# Patient Record
Sex: Female | Born: 1985
Health system: Southern US, Community
[De-identification: ages and names within clinical notes are randomized; demographics above are authoritative.]

## PROBLEM LIST (undated history)

## (undated) DIAGNOSIS — C801 Malignant (primary) neoplasm, unspecified: Secondary | ICD-10-CM

## (undated) HISTORY — PX: NO PAST SURGERIES: SHX2092

## (undated) HISTORY — PX: TOOTH EXTRACTION: SUR596

---

## 2019-05-07 ENCOUNTER — Ambulatory Visit (INDEPENDENT_AMBULATORY_CARE_PROVIDER_SITE_OTHER): Payer: Medicaid Other | Admitting: Family Medicine

## 2019-05-07 ENCOUNTER — Inpatient Hospital Stay (HOSPITAL_COMMUNITY)
Admission: EM | Admit: 2019-05-07 | Discharge: 2019-05-10 | DRG: 195 | Disposition: A | Discharge status: Home / Self Care | Payer: Medicaid Other | Attending: Internal Medicine | Admitting: Internal Medicine

## 2019-05-07 ENCOUNTER — Ambulatory Visit (INDEPENDENT_AMBULATORY_CARE_PROVIDER_SITE_OTHER): Payer: Medicaid Other

## 2019-05-07 ENCOUNTER — Emergency Department (HOSPITAL_COMMUNITY): Payer: Medicaid Other

## 2019-05-07 ENCOUNTER — Encounter (HOSPITAL_COMMUNITY): Payer: Self-pay

## 2019-05-07 ENCOUNTER — Ambulatory Visit (HOSPITAL_COMMUNITY)
Admission: EM | Admit: 2019-05-07 | Discharge: 2019-05-07 | Disposition: A | Discharge status: Other Acute Inpatient Hospital | Payer: Medicaid Other | Attending: Family Medicine | Admitting: Family Medicine

## 2019-05-07 ENCOUNTER — Encounter (HOSPITAL_COMMUNITY): Payer: Self-pay | Admitting: Emergency Medicine

## 2019-05-07 ENCOUNTER — Encounter: Payer: Self-pay | Admitting: Family Medicine

## 2019-05-07 ENCOUNTER — Other Ambulatory Visit: Payer: Self-pay

## 2019-05-07 VITALS — BP 150/98 | HR 154 | Temp 98.6°F | Resp 14 | Ht 66.93 in | Wt 197.2 lb

## 2019-05-07 DIAGNOSIS — J189 Pneumonia, unspecified organism: Principal | ICD-10-CM | POA: Diagnosis present

## 2019-05-07 DIAGNOSIS — R0600 Dyspnea, unspecified: Secondary | ICD-10-CM

## 2019-05-07 DIAGNOSIS — R0602 Shortness of breath: Secondary | ICD-10-CM

## 2019-05-07 DIAGNOSIS — R0609 Other forms of dyspnea: Secondary | ICD-10-CM

## 2019-05-07 DIAGNOSIS — R05 Cough: Secondary | ICD-10-CM | POA: Diagnosis not present

## 2019-05-07 DIAGNOSIS — R058 Other specified cough: Secondary | ICD-10-CM

## 2019-05-07 DIAGNOSIS — Z8349 Family history of other endocrine, nutritional and metabolic diseases: Secondary | ICD-10-CM

## 2019-05-07 DIAGNOSIS — F411 Generalized anxiety disorder: Secondary | ICD-10-CM

## 2019-05-07 DIAGNOSIS — R000 Tachycardia, unspecified: Secondary | ICD-10-CM | POA: Diagnosis not present

## 2019-05-07 DIAGNOSIS — L659 Nonscarring hair loss, unspecified: Secondary | ICD-10-CM | POA: Diagnosis not present

## 2019-05-07 DIAGNOSIS — R0902 Hypoxemia: Secondary | ICD-10-CM

## 2019-05-07 DIAGNOSIS — F329 Major depressive disorder, single episode, unspecified: Secondary | ICD-10-CM | POA: Diagnosis present

## 2019-05-07 DIAGNOSIS — Z20822 Contact with and (suspected) exposure to covid-19: Secondary | ICD-10-CM | POA: Diagnosis present

## 2019-05-07 DIAGNOSIS — F32A Depression, unspecified: Secondary | ICD-10-CM

## 2019-05-07 DIAGNOSIS — R634 Abnormal weight loss: Secondary | ICD-10-CM

## 2019-05-07 DIAGNOSIS — F321 Major depressive disorder, single episode, moderate: Secondary | ICD-10-CM

## 2019-05-07 DIAGNOSIS — Z818 Family history of other mental and behavioral disorders: Secondary | ICD-10-CM

## 2019-05-07 DIAGNOSIS — Z8249 Family history of ischemic heart disease and other diseases of the circulatory system: Secondary | ICD-10-CM

## 2019-05-07 DIAGNOSIS — Z79899 Other long term (current) drug therapy: Secondary | ICD-10-CM

## 2019-05-07 DIAGNOSIS — Z7689 Persons encountering health services in other specified circumstances: Secondary | ICD-10-CM

## 2019-05-07 DIAGNOSIS — Z833 Family history of diabetes mellitus: Secondary | ICD-10-CM

## 2019-05-07 DIAGNOSIS — F419 Anxiety disorder, unspecified: Secondary | ICD-10-CM | POA: Diagnosis present

## 2019-05-07 DIAGNOSIS — E0590 Thyrotoxicosis, unspecified without thyrotoxic crisis or storm: Secondary | ICD-10-CM | POA: Diagnosis present

## 2019-05-07 DIAGNOSIS — Z809 Family history of malignant neoplasm, unspecified: Secondary | ICD-10-CM

## 2019-05-07 DIAGNOSIS — Z23 Encounter for immunization: Secondary | ICD-10-CM

## 2019-05-07 LAB — CBC WITH DIFFERENTIAL/PLATELET
Abs Immature Granulocytes: 0.07 10*3/uL (ref 0.00–0.07)
Basophils Absolute: 0 10*3/uL (ref 0.0–0.1)
Basophils Relative: 0 %
Eosinophils Absolute: 0 10*3/uL (ref 0.0–0.5)
Eosinophils Relative: 0 %
HCT: 38.7 % (ref 36.0–46.0)
Hemoglobin: 11.9 g/dL — ABNORMAL LOW (ref 12.0–15.0)
Immature Granulocytes: 0 %
Lymphocytes Relative: 18 %
Lymphs Abs: 3 10*3/uL (ref 0.7–4.0)
MCH: 25.2 pg — ABNORMAL LOW (ref 26.0–34.0)
MCHC: 30.7 g/dL (ref 30.0–36.0)
MCV: 81.8 fL (ref 80.0–100.0)
Monocytes Absolute: 1.2 10*3/uL — ABNORMAL HIGH (ref 0.1–1.0)
Monocytes Relative: 7 %
Neutro Abs: 12.3 10*3/uL — ABNORMAL HIGH (ref 1.7–7.7)
Neutrophils Relative %: 75 %
Platelets: 575 10*3/uL — ABNORMAL HIGH (ref 150–400)
RBC: 4.73 MIL/uL (ref 3.87–5.11)
RDW: 15.4 % (ref 11.5–15.5)
WBC: 16.7 10*3/uL — ABNORMAL HIGH (ref 4.0–10.5)
nRBC: 0 % (ref 0.0–0.2)

## 2019-05-07 LAB — COMPREHENSIVE METABOLIC PANEL
ALT: 31 U/L (ref 0–44)
AST: 26 U/L (ref 15–41)
Albumin: 3.2 g/dL — ABNORMAL LOW (ref 3.5–5.0)
Alkaline Phosphatase: 82 U/L (ref 38–126)
Anion gap: 12 (ref 5–15)
BUN: 7 mg/dL (ref 6–20)
CO2: 21 mmol/L — ABNORMAL LOW (ref 22–32)
Calcium: 9.4 mg/dL (ref 8.9–10.3)
Chloride: 105 mmol/L (ref 98–111)
Creatinine, Ser: 0.63 mg/dL (ref 0.44–1.00)
GFR calc Af Amer: >60 mL/min (ref >60)
GFR calc non Af Amer: >60 mL/min (ref >60)
Glucose, Bld: 117 mg/dL — ABNORMAL HIGH (ref 70–99)
Potassium: 3.8 mmol/L (ref 3.5–5.1)
Sodium: 138 mmol/L (ref 135–145)
Total Bilirubin: 0.4 mg/dL (ref 0.3–1.2)
Total Protein: 7.9 g/dL (ref 6.5–8.1)

## 2019-05-07 LAB — I-STAT BETA HCG BLOOD, ED (MC, WL, AP ONLY): I-stat hCG, quantitative: <5 m[IU]/mL (ref <5)

## 2019-05-07 LAB — POC SARS CORONAVIRUS 2 AG -  ED: SARS Coronavirus 2 Ag: NEGATIVE

## 2019-05-07 LAB — POC SARS CORONAVIRUS 2 AG: SARS Coronavirus 2 Ag: NEGATIVE

## 2019-05-07 MED ORDER — SODIUM CHLORIDE 0.9 % IV BOLUS
1000.0000 mL | Freq: Once | INTRAVENOUS | Status: AC
  Administered 2019-05-07: 1000 mL via INTRAVENOUS

## 2019-05-07 MED ORDER — LORAZEPAM 2 MG/ML IJ SOLN
0.5000 mg | Freq: Once | INTRAMUSCULAR | Status: AC
  Administered 2019-05-07: 0.5 mg via INTRAVENOUS
  Filled 2019-05-07: qty 1

## 2019-05-07 MED ORDER — DIAZEPAM 5 MG/ML IJ SOLN
INTRAMUSCULAR | Status: AC
  Filled 2019-05-07: qty 2

## 2019-05-07 MED ORDER — ATENOLOL 25 MG PO TABS: 25.0000 mg | ORAL_TABLET | Freq: Once | ORAL | Status: DC

## 2019-05-07 NOTE — ED Notes (Signed)
Patient is being discharged from the [facility] and sent to the Emergency Department via POV. Per SN, patient is stable but in need of higher level of care due to PNA. Patient is aware and verbalizes understanding of plan of care.  Vitals:   05/07/19 1804  BP: 139/85  Pulse: (!) 150  Resp: (!) 24  Temp: 97.8 F (36.6 C)  SpO2: 94%

## 2019-05-07 NOTE — ED Provider Notes (Signed)
[facility] EMERGENCY DEPARTMENT Provider Note   CSN: [id number] Arrival date & time: 05/07/19  2049     History Chief Complaint  Patient presents with   Pneumonia    Sarah Chandler is a 34 y.o. female with a hx of no major medical problems presents to the Emergency Department complaining of gradual, persistent, progressively worsening palpitations and shortness of breath with movement onset Nov 2020.  Pt reports if she is resting she feels fine.  Pt reports associated decreased exercise tolerance, weight loss, thinning hair, decreased appetite.  She denies skin changes, diarrhea, heat/cold intolerance.  Pt reports feeling depressed and anxious over the last year and significantly worse in the last few month.   She reports approx 1 mo of sinus pressure, post nasal drip and cough. Pt reports she normally has year round allergies and often takes Claritin which has helped.  Robitussin has also helped with the cough.  Pt reports she has not come into contact with COVID and does not believe this is the cause of her symptoms.    Pt was evaluated at PCP today and EMS was called for severe tachycardia.  She declined transport by EMS and presented to the Urgent Care for evaluation.  She was subsequently transferred here to the ED.     The history is provided by the patient and medical records. No language interpreter was used.       History reviewed. No pertinent past medical history.  Patient Active Problem List   Diagnosis Date Noted   Community acquired pneumonia 05/08/2019   Thyroid storm 05/08/2019   Depression 05/08/2019   Hyperthyroidism 05/08/2019    History reviewed. No pertinent surgical history.   OB History   No obstetric history on file.     Family History  Problem Relation Age of Onset   Diabetes Father    Hyperlipidemia Father    Hypertension Father    Depression Maternal Uncle    Diabetes Maternal Uncle    Cancer Maternal  Grandmother    Hyperlipidemia Maternal Grandmother    Cancer Paternal Grandfather     Social History   Tobacco Use   Smoking status: Never Smoker   Smokeless tobacco: Never Used  Substance Use Topics   Alcohol use: Never   Drug use: Never    Home Medications Prior to Admission medications   Medication Sig Start Date End Date Taking? Authorizing Provider  guaiFENesin-dextromethorphan (ROBITUSSIN DM) 100-10 MG/5ML syrup Take 5 mLs by mouth every 4 (four) hours as needed for cough (PRN cough).   Yes [provider]  Multiple Vitamin (MULTIVITAMIN WITH MINERALS) TABS tablet Take 1 tablet by mouth daily.   Yes [provider]  OVER THE COUNTER MEDICATION CBD gummies taking 2 daily   Yes [provider]    Allergies    Enfamil reguline-iron [alimentum]  Review of Systems   Review of Systems  Constitutional: Positive for appetite change and fatigue. Negative for diaphoresis, fever and unexpected weight change.  HENT: Negative for mouth sores.   Eyes: Negative for visual disturbance.  Respiratory: Positive for cough and shortness of breath. Negative for chest tightness and wheezing.   Cardiovascular: Positive for palpitations. Negative for chest pain and leg swelling.  Gastrointestinal: Negative for abdominal pain, constipation, diarrhea, nausea and vomiting.  Endocrine: Positive for polydipsia. Negative for polyphagia and polyuria.  Genitourinary: Negative for dysuria, frequency, hematuria and urgency.  Musculoskeletal: Negative for back pain and neck stiffness.  Skin: Negative for rash.  Allergic/Immunologic: Negative for immunocompromised state.  Neurological: Negative for syncope, light-headedness and headaches.  Hematological: Does not bruise/bleed easily.  Psychiatric/Behavioral: Negative for sleep disturbance. The patient is not nervous/anxious.     Physical Exam Updated Vital Signs BP (!) 135/95 (BP Location: Left Arm)    Pulse (!) 149     Temp 98.1 F (36.7 C) (Oral)    Resp 18    LMP 04/16/2019    SpO2 95%   Physical Exam Vitals and nursing note reviewed.  Constitutional:      General: She is not in acute distress.    Appearance: She is not diaphoretic.  HENT:     Head: Normocephalic.  Eyes:     General: No scleral icterus.    Conjunctiva/sclera: Conjunctivae normal.  Neck:     Thyroid: Thyromegaly present.  Cardiovascular:     Rate and Rhythm: Regular rhythm. Tachycardia present.     Pulses: Normal pulses.          Radial pulses are 2+ on the right side and 2+ on the left side.     Comments: Significant tachycardia in the 140s to 150s Pulmonary:     Effort: Tachypnea present. No accessory muscle usage, prolonged expiration, respiratory distress or retractions.     Breath sounds: No stridor. Examination of the right-middle field reveals rales. Examination of the left-middle field reveals rales. Examination of the right-lower field reveals rales. Examination of the left-lower field reveals rales. Rales present.     Comments: Equal chest rise. Mild increased work of breathing. Abdominal:     General: There is no distension.     Palpations: Abdomen is soft.     Tenderness: There is no abdominal tenderness. There is no guarding or rebound.  Musculoskeletal:     Cervical back: Normal range of motion.     Comments: Moves all extremities equally and without difficulty.  Skin:    General: Skin is warm and dry.     Capillary Refill: Capillary refill takes less than 2 seconds.  Neurological:     Mental Status: She is alert.     GCS: GCS eye subscore is 4. GCS verbal subscore is 5. GCS motor subscore is 6.     Comments: Speech is clear and goal oriented.  Psychiatric:        Mood and Affect: Mood normal.     ED Results / Procedures / Treatments   Labs (all labs ordered are listed, but only abnormal results are displayed) Labs Reviewed  CBC WITH DIFFERENTIAL/PLATELET - Abnormal; Notable for the following  components:      Result Value   WBC 16.7 (*)    Hemoglobin 11.9 (*)    MCH 25.2 (*)    Platelets 575 (*)    Neutro Abs 12.3 (*)    Monocytes Absolute 1.2 (*)    All other components within normal limits  COMPREHENSIVE METABOLIC PANEL - Abnormal; Notable for the following components:   CO2 21 (*)    Glucose, Bld 117 (*)    Albumin 3.2 (*)    All other components within normal limits  TSH - Abnormal; Notable for the following components:   TSH <0.010 (*)    All other components within normal limits  T4, FREE - Abnormal; Notable for the following components:   Free T4 1.70 (*)    All other components within normal limits  URINALYSIS, ROUTINE W REFLEX MICROSCOPIC - Abnormal; Notable for the following components:   APPearance HAZY (*)  Specific Gravity, Urine 1.036 (*)    Hgb urine dipstick LARGE (*)    Ketones, ur 20 (*)    Bacteria, UA RARE (*)    All other components within normal limits  RESPIRATORY PANEL BY PCR  SARS CORONAVIRUS 2 (TAT 6-24 HRS)  BRAIN NATRIURETIC PEPTIDE  T3  T3 UPTAKE  T3, FREE  HIV ANTIBODY (ROUTINE TESTING W REFLEX)  BASIC METABOLIC PANEL  CBC  I-STAT BETA HCG BLOOD, ED ([facility], [facility], [facility] ONLY)    EKG EKG Interpretation  Date/Time:  Friday May 08 2019 02:00:40 EST Ventricular Rate:  120 PR Interval:    QRS Duration: 99 QT Interval:  318 QTC Calculation: 450 R Axis:   12 Text Interpretation: Sinus tachycardia RSR' in V1 or V2, right VCD or RVH When compared with ECG of 05/07/2019, No significant change was found Confirmed by Delora Fuel ([id number]) on 05/08/2019 3:05:49 AM    Radiology DG Chest 2 View  Result Date: 05/07/2019 CLINICAL DATA:  Tachycardia EXAM: CHEST - 2 VIEW COMPARISON:  None. FINDINGS: Low lung volumes. Fairly extensive bilateral airspace disease, greatest within the lower lungs. Small pleural effusions. Normal heart size. No pneumothorax. IMPRESSION: Fairly extensive bilateral lower lung airspace disease, likely representing  bilateral pneumonia. Probable small pleural effusions. Electronically Signed   By: Donavan Foil M.D.   On: 05/07/2019 18:49   CT Angio Chest PE W and/or Wo Contrast  Result Date: 05/08/2019 CLINICAL DATA:  Shortness of breath, PE suspected, high probability EXAM: CT ANGIOGRAPHY CHEST WITH CONTRAST TECHNIQUE: Multidetector CT imaging of the chest was performed using the standard protocol during bolus administration of intravenous contrast. Multiplanar CT image reconstructions and MIPs were obtained to evaluate the vascular anatomy. CONTRAST:  28mL OMNIPAQUE IOHEXOL 350 MG/ML SOLN COMPARISON:  Chest radiograph 05/07/2019 FINDINGS: Cardiovascular: Satisfactory opacification the pulmonary arteries to the segmental level. No pulmonary artery filling defects are identified. Central pulmonary arteries are normal caliber. Normal heart size. No pericardial effusion. The aorta is normal caliber. Normal 3 vessel branching of the arch. Mediastinum/Nodes: Likely reactive subcentimeter low-attenuation mediastinal and hilar nodes are present. No pathologically enlarged enlarged mediastinal or, hilar axillary lymph nodes. Thyroid gland, trachea, and esophagus demonstrate no significant findings. Lungs/Pleura: Extensive peripheral predominant areas of patchy consolidated airspace disease throughout both lungs. Some superimposed areas of interlobular septal thickening and fissural thickening are noted as well. Trace pleural effusion is seen in both lung bases. Upper Abdomen: No acute abnormalities present in the visualized portions of the upper abdomen. Musculoskeletal: Multilevel degenerative changes are present in the imaged portions of the spine. No acute osseous abnormality or suspicious osseous lesion. Review of the MIP images confirms the above findings. IMPRESSION: 1. No evidence of acute pulmonary artery filling defects. 2. Extensive peripheral predominant areas of patchy consolidated airspace disease throughout both  lungs, likely representing multifocal pneumonia. 3. Septal thickening with trace bilateral effusions may suggest some superimposed edema as well. Electronically Signed   By: Lovena Le M.D.   On: 05/08/2019 00:18    Procedures .Critical Care Performed by: Abigail Butts, PA-C Authorized by: Abigail Butts, PA-C   Critical care provider statement:    Critical care time (minutes):  45   Critical care time was exclusive of:  Separately billable procedures and treating other patients and teaching time   Critical care was necessary to treat or prevent imminent or life-threatening deterioration of the following conditions:  Endocrine crisis   Critical care was time spent personally by me on the following activities:  Discussions with consultants, evaluation of patient's response to treatment, examination of patient, ordering and performing treatments and interventions, ordering and review of laboratory studies, ordering and review of radiographic studies, pulse oximetry, re-evaluation of patient's condition, obtaining history from patient or surrogate and review of old charts   I assumed direction of critical care for this patient from another provider in my specialty: no     (including critical care time)  Medications Ordered in ED Medications  multivitamin with minerals tablet 1 tablet (has no administration in time range)  guaiFENesin-dextromethorphan (ROBITUSSIN DM) 100-10 MG/5ML syrup 5 mL (has no administration in time range)  enoxaparin (LOVENOX) injection 40 mg (has no administration in time range)  sodium chloride flush (NS) 0.9 % injection 3 mL (3 mLs Intravenous Given 05/08/19 0322)  sodium chloride flush (NS) 0.9 % injection 3 mL (has no administration in time range)  0.9 %  sodium chloride infusion (has no administration in time range)  dextrose 5 %-0.9 % sodium chloride infusion ( Intravenous New Bag/Given 05/08/19 0321)  acetaminophen (TYLENOL) tablet 650 mg (has no  administration in time range)    Or  acetaminophen (TYLENOL) suppository 650 mg (has no administration in time range)  ondansetron (ZOFRAN) tablet 4 mg (has no administration in time range)    Or  ondansetron (ZOFRAN) injection 4 mg (has no administration in time range)  cefTRIAXone (ROCEPHIN) 1 g in sodium chloride 0.9 % 100 mL IVPB (has no administration in time range)  azithromycin (ZITHROMAX) 500 mg in sodium chloride 0.9 % 250 mL IVPB (has no administration in time range)  propranolol (INDERAL) tablet 40 mg (has no administration in time range)  methimazole (TAPAZOLE) tablet 5 mg (5 mg Oral Given 05/08/19 0247)  LORazepam (ATIVAN) tablet 0.5 mg (has no administration in time range)  sodium chloride 0.9 % bolus 1,000 mL (0 mLs Intravenous Stopped 05/08/19 0205)  LORazepam (ATIVAN) injection 0.5 mg (0.5 mg Intravenous Given 05/07/19 2321)  iohexol (OMNIPAQUE) 350 MG/ML injection 80 mL (80 mLs Intravenous Contrast Given 05/08/19 0004)  propranolol ER (INDERAL LA) 24 hr capsule 60 mg (60 mg Oral Given 05/08/19 0049)  cefTRIAXone (ROCEPHIN) 1 g in sodium chloride 0.9 % 100 mL IVPB (0 g Intravenous Stopped 05/08/19 0212)  azithromycin (ZITHROMAX) 500 mg in sodium chloride 0.9 % 250 mL IVPB (0 mg Intravenous Stopped 05/08/19 0246)    ED Course  I have reviewed the triage vital signs and the nursing notes.  Pertinent labs & imaging results that were available during my care of the patient were reviewed by me and considered in my medical decision making (see chart for details).  Clinical Course as of May 08 331  Fri May 08, 2019  0127 Leukocytosis  WBC(!): 16.7 [HM]  0127 Undetectable  TSH(!): <0.010 [HM]  0128 Patient with negative Covid test at urgent care today.   [HM]  2202 Discussed with [facility] who will admit   [HM]    Clinical Course User Index [HM] Montez Cuda, Gwenlyn Perking   MDM Rules/Calculators/A&P                      Patient presents with tachycardia and  shortness of breath.  Review of systems reveals additional concern for hyperthyroidism.  She is persistently tachycardic in the 140s to 150s and has rales bilaterally.  Concern for thyrotoxicosis.  Patient is not altered and is afebrile.  Doubt thyroid storm.  Labs with leukocytosis and mild anemia.  Patient reports a history  of anemia.  She is not pregnant.  TSH is undetectable and free T4 is elevated.  Patient given Ativan for anxiety and propanolol for severe tachycardia.  Additionally, chest x-ray at urgent care showed bibasilar and multifocal pneumonia.  Given this and severe tachycardia some concern for pulmonary embolism.  CT angiogram without PE but does have extensive multifocal pneumonia.  Additionally some edema is noted.  Patient has not recently been hospitalized.  Treatment for community-acquired pneumonia initiated.  Patient will need to be admitted.    Final Clinical Impression(s) / ED Diagnoses Final diagnoses:  Thyrotoxicosis, acute  Community acquired pneumonia, unspecified laterality  Shortness of breath  Tachycardia    Rx / DC Orders ED Discharge Orders    None       Loni Muse Gwenlyn Perking 05/08/19 [place], Delice Bison, DO 05/08/19 [phone]

## 2019-05-07 NOTE — ED Triage Notes (Signed)
Patient seen at urgent care this evening sent her for treatment , CXR result shows pneumonia , patient added persistent productive cough with SOB for several weeks , denies fever or chills .

## 2019-05-07 NOTE — Progress Notes (Addendum)
New patient office visit note:  Impression and Recommendations:    1. Encounter to establish care with new doctor   2. Tachycardia, unspecified- new onset   3. DOE (dyspnea on exertion)-  new onset   4. Hypoxemia-  91% RA upon arrival for new pt OV   5. SOB (shortness of breath)-  new onset   6. Productive cough-  new onset     Tachycardia and SOB on Intake -  Patient evaluated and due to her worrisome sx- EMS was called due to red flag symptoms.  - Upon interview, the patient was found to have a pulse ox of 91 percent on room air, and an elevated heart rate ranging from 154 to 157.  Patient's pulse ox improved to 08-67 after application of 2L [place] oxygen.  CV rythym strip showed Sinus Tach- c/w exam findings today  - Care transferred to EMS Mr. Zadie Rhine on the EMS Team w/in 6 min of me meeting pt.    I explained to pt my concerns that she has PNA and recommended she needed CXR among other tests and immediate interventions in ED  I specifically told patient as well as the EMS provider my physical exam findings of rhonchi and crackles in the left lower posterior base greater than the right and my concerns that pt had pneumonia due to PE findings and supporting vital signs.   --> Sister of pt stated she wanted her to go to [facility] ED for eval per our recommendations.    Gross side effects, risk and benefits, and alternatives of medications discussed with patient.  Patient is aware that all medications have potential side effects and we are unable to predict every side effect or drug-drug interaction that may occur.  Expresses verbal understanding and consents to current therapy plan and treatment regimen.    Please see AVS handed out to patient at the end of our visit for further patient instructions/ counseling done pertaining to today's office visit.    Note:  This document was prepared using Dragon voice recognition software and may include unintentional dictation  errors.  This document serves as a record of services personally performed by Mellody Dance, DO. It was created on her behalf by Toni Amend, a trained medical scribe. The creation of this record is based on the scribe's personal observations and the provider's statements to them.   This case required medical decision making of at least moderate complexity. The above documentation has been reviewed to be accurate and was completed by Marjory Sneddon, D.O.      ---------------------------------------------------------------------------------------------------------------------------------------------------------------------------------------------    Subjective:    Chief complaint:   Chief Complaint  Patient presents with  . New Patient (Initial Visit)     HPI: Sarah Chandler is a pleasant 34 y.o. female who presents to [place] at [facility] today to review their medical history with me and establish care.   I asked the patient to review their chronic problem list with me to ensure everything was updated and accurate.    All recent office visits with other providers, any medical records that patient brought in etc  - I reviewed today.     We asked pt to get [place] their medical records from [facility] providers/ specialists that they had seen within the past 3-5 years- if they are in private practice and/or do not work for [employer], [facility], [place], [place] or [employer] owned practice.  Told  them to call their specialists to clarify this if they are not sure.     - Shortness of Breath and Tachycardia on Intake - CMA immediately made me aware patient was running 91-92% on room air with heart rate 155  Patient reports 2-3 months of feeling like her heart is racing, new onset prod cough, and shortness of breath/ DOE.   She has a history of anxiety and thought maybe she was just feeling anxious, but patient endorses she never had elevated heart rate and felt short of  breath like this before.  She endorses her sx worsen with activity/ exertion in which she becomes much more short of breath than usual.  Upon interview, the patient was found to have a pulse ox of 91-92 percent on room air, and an elevated heart rate ranging from 154 to 157 range.   Notes it started out as sinus congestion several months ago, and the patient just thought it would go away, and it has worsened.  Says "I've had some sinus problems and have had to cough some mucus up."   Patient notes she hasn't had a fever, not that she knows of.  She thinks that her sinus issues / cough started around November 2020.  Patient's pulse ox improved to 54-09 after application of 2 L [place] oxygen.    Wt Readings from Last 3 Encounters:  05/07/19 197 lb 3.2 oz (89.4 kg)   BP Readings from Last 3 Encounters:  05/07/19 (!) 150/98   Pulse Readings from Last 3 Encounters:  05/07/19 (!) 154   BMI Readings from Last 3 Encounters:  05/07/19 30.95 kg/m    Patient Care Team    Relationship Specialty Notifications Start End  Mellody Dance, DO PCP - General Family Medicine  05/07/19     As reported by pt:  History reviewed. No pertinent past medical history.   History reviewed. No pertinent surgical history.   Family History  Problem Relation Age of Onset  . Diabetes Father   . Hyperlipidemia Father   . Hypertension Father   . Depression Maternal Uncle   . Diabetes Maternal Uncle   . Cancer Maternal Grandmother   . Hyperlipidemia Maternal Grandmother   . Cancer Paternal Grandfather      Social History   Substance and Sexual Activity  Drug Use Never     Social History   Substance and Sexual Activity  Alcohol Use Never     Social History   Tobacco Use  Smoking Status Never Smoker  Smokeless Tobacco Never Used     Current Meds  Medication Sig  . guaiFENesin-dextromethorphan (ROBITUSSIN DM) 100-10 MG/5ML syrup Take 5 mLs by mouth every 4 (four) hours as needed for cough  (PRN cough).  . Multiple Vitamins-Minerals (WOMENS MULTIVITAMIN PLUS PO) Take by mouth.  Marland Kitchen OVER THE COUNTER MEDICATION CBD gummies taking 2 daily    Allergies: Enfamil reguline-iron [alimentum]   Review of Systems  Constitutional: Positive for malaise/fatigue. Negative for chills, diaphoresis, fever and weight loss.       Generalized fatigue and tiredness  HENT: Positive for congestion and sinus pain. Negative for sore throat and tinnitus.   Eyes: Negative for blurred vision, double vision and photophobia.  Respiratory: Positive for cough, sputum production and shortness of breath. Negative for wheezing.   Cardiovascular: Positive for palpitations. Negative for chest pain.       Feels heart races at times -Complains of excessive dyspnea on exertion  Gastrointestinal: Negative for blood in stool, diarrhea,  nausea and vomiting.  Genitourinary: Negative for dysuria, frequency and urgency.  Musculoskeletal: Positive for myalgias. Negative for joint pain.       Generalized achiness  Skin: Negative for itching and rash.  Neurological: Positive for weakness. Negative for dizziness, focal weakness and headaches.       Generalized weakness  Endo/Heme/Allergies: Negative for environmental allergies and polydipsia. Does not bruise/bleed easily.  Psychiatric/Behavioral: Negative for memory loss and suicidal ideas. The patient is nervous/anxious and has insomnia.         Objective:   Blood pressure (!) 150/98, pulse (!) 154, temperature 98.6 F (37 C), temperature source Oral, resp. rate 14, height 5' 6.93" (1.7 m), weight 197 lb 3.2 oz (89.4 kg), last menstrual period 04/16/2019, SpO2 92 %. Body mass index is 30.95 kg/m. General: Well Developed, well nourished, and in no acute distress.  Neuro: Alert and oriented x3, extra-ocular muscles intact, sensation grossly intact.  HEENT:[place]/AT, PERRLA, neck supple, No carotid bruits Skin: no gross rashes  Cardiac:   On exam, the patient is  tachycardic. Regular rate and rhythm. No m apprec Respiratory:  Left lower lobe of her lung is full of crackles and rhonchi. Not using accessory muscles, speaking in full sentences.  Abdominal: not grossly distended Musculoskeletal: Ambulates w/o diff, FROM * 4 ext.  Vasc: less 2 sec cap RF, warm and pink  Psych:  No HI/SI, judgement and insight good, Euthymic mood. Full Affect.

## 2019-05-07 NOTE — ED Provider Notes (Signed)
[place]    CSN: [id number] Arrival date & time: 05/07/19  [place]      History   Chief Complaint Chief Complaint  Patient presents with  . Shortness of Breath  . Palpitations    HPI Sarah Chandler is a 34 y.o. female.   HPI  Patient states that starting in October she noticed that she was having tachycardia with exertion.  Her exercise tolerance went down.  She states that at times she would have some shortness of breath.  Since that time the symptoms have become worse.  She has lost weight.  She states her hair is thinning.  She states that she is having more anxiety and depression.  She has anxiety when her heart rate is high.  She states that she thought at first it was from stress.  Then she realized it was a medical issue, and friend suggested she get her thyroid checked.  She made an appointment with a primary care doctor.  Today was her first visit.  She arrived at the primary care doctor and they noted immediately she had abnormal vital signs.  Heart rate 154, pulse ox 91, respiratory rate 24.  They called EMS with directions to take her to the emergency room. Patient states that the EMS sat with her in the parking lot and spoke with her.  She calmed down.  She states her heart rate came down to 140.  She admitted that she was anxious.  Not having chest pain.  Shortness of breath improved.  They let her go in a private vehicle with the instructions to go to the ER or to the urgent care center if she became worse. Patient states that she has had no fever or chills.  No body aches.  No change in taste or smell.  Appetite has been reduced. She states she has been very careful to avoid Covid.  Stays home most of the time.  Wears her mask when out.  Washes hands frequently. States that she lives with her parents who have medical needs and a grandparent.  She helps to care for them.  She is fearful of taking Covid home to them, and feels like she is quite cautious. She states  her health prior to October was good.  She was on no medications.  She does suffer at times from social anxiety. She denies any diarrhea or change in her bowel habits. She denies any change in her menstrual habits. She states that she has not had heat or cold intolerance.  History reviewed. No pertinent past medical history.  There are no problems to display for this patient.   History reviewed. No pertinent surgical history.  OB History   No obstetric history on file.      Home Medications    Prior to Admission medications   Medication Sig Start Date End Date Taking? Authorizing Provider  guaiFENesin-dextromethorphan (ROBITUSSIN DM) 100-10 MG/5ML syrup Take 5 mLs by mouth every 4 (four) hours as needed for cough (PRN cough).    [provider]  Multiple Vitamins-Minerals (WOMENS MULTIVITAMIN PLUS PO) Take by mouth.    [provider]  OVER THE COUNTER MEDICATION CBD gummies taking 2 daily    [provider]    Family History Family History  Problem Relation Age of Onset  . Diabetes Father   . Hyperlipidemia Father   . Hypertension Father   . Depression Maternal Uncle   . Diabetes Maternal Uncle   . Cancer Maternal  Grandmother   . Hyperlipidemia Maternal Grandmother   . Cancer Paternal Grandfather   States mother has arthritis and chronic pain issues  Social History Social History   Tobacco Use  . Smoking status: Never Smoker  . Smokeless tobacco: Never Used  Substance Use Topics  . Alcohol use: Never  . Drug use: Never     Allergies   Enfamil reguline-iron [alimentum]   Review of Systems Review of Systems  Constitutional: Positive for activity change, appetite change, fatigue and unexpected weight change. Negative for chills, diaphoresis and fever.  HENT: Negative for congestion.   Eyes: Negative for visual disturbance.       Asymmetry in appearance of eyes  Respiratory: Positive for shortness of breath. Negative for cough.     Cardiovascular: Positive for palpitations. Negative for chest pain and leg swelling.  Gastrointestinal: Negative for diarrhea, nausea and vomiting.  Genitourinary: Negative for menstrual problem.  Musculoskeletal: Negative for myalgias and neck pain.  Skin: Negative for color change.       Hair loss  Neurological: Negative for light-headedness and headaches.  Psychiatric/Behavioral: Positive for dysphoric mood. The patient is nervous/anxious.      Physical Exam Triage Vital Signs ED Triage Vitals  Enc Vitals Group     BP 05/07/19 1804 139/85     Pulse Rate 05/07/19 1804 (!) 150     Resp 05/07/19 1804 (!) 24     Temp 05/07/19 1804 97.8 F (36.6 C)     Temp Source 05/07/19 1804 Oral     SpO2 05/07/19 1804 94 %     Weight --      Height --      Head Circumference --      Peak Flow --      Pain Score 05/07/19 1801 0     Pain Loc --      Pain Edu? --      Excl. in [place]? --    No data found.  Updated Vital Signs BP 139/85 (BP Location: Left Arm)   Pulse (!) 150   Temp 97.8 F (36.6 C) (Oral)   Resp (!) 24   LMP 04/11/2019   SpO2 94%      Physical Exam Constitutional:      Appearance: She is well-developed. She is ill-appearing.     Comments: overweight  HENT:     Head: Normocephalic and atraumatic.     Mouth/Throat:     Mouth: Mucous membranes are moist.  Eyes:     Pupils: Pupils are equal, round, and reactive to light.     Comments: Slight prominence R eye  Neck:     Thyroid: No thyromegaly.  Cardiovascular:     Rate and Rhythm: Regular rhythm. Tachycardia present.  Pulmonary:     Effort: Pulmonary effort is normal. Tachypnea present. No accessory muscle usage.     Breath sounds: Examination of the right-upper field reveals rales. Examination of the left-upper field reveals rales. Examination of the right-middle field reveals rales. Examination of the left-middle field reveals rales. Examination of the right-lower field reveals rales. Examination of the  left-lower field reveals rales. Rales present. No decreased breath sounds.  Chest:     Chest wall: No tenderness.  Musculoskeletal:     Right lower leg: No edema.     Left lower leg: No edema.  Lymphadenopathy:     Cervical: No cervical adenopathy.  Skin:    General: Skin is warm and dry.  Neurological:     General:  No focal deficit present.     Mental Status: She is alert.  Psychiatric:        Mood and Affect: Mood is anxious.      UC Treatments / Results  Labs (all labs ordered are listed, but only abnormal results are displayed) Labs Reviewed  POC SARS CORONAVIRUS 2 AG -  ED  POC SARS CORONAVIRUS 2 AG  POC COVID= negative  EKG   Radiology DG Chest 2 View  Result Date: 05/07/2019 CLINICAL DATA:  Tachycardia EXAM: CHEST - 2 VIEW COMPARISON:  None. FINDINGS: Low lung volumes. Fairly extensive bilateral airspace disease, greatest within the lower lungs. Small pleural effusions. Normal heart size. No pneumothorax. IMPRESSION: Fairly extensive bilateral lower lung airspace disease, likely representing bilateral pneumonia. Probable small pleural effusions. Electronically Signed   By: Donavan Foil M.D.   On: 05/07/2019 18:49    Procedures Procedures (including critical care time)  Medications Ordered in UC Medications - No data to display  Initial Impression / Assessment and Plan / UC Course  I have reviewed the triage vital signs and the nursing notes.  Pertinent labs & imaging results that were available during my care of the patient were reviewed by me and considered in my medical decision making (see chart for details).    Bilat pneumonia, possible COVID, with unstable vital signs and suspicion of hyperthyroidism.   Final Clinical Impressions(s) / UC Diagnoses   Final diagnoses:  Regular sinus tachycardia  Loss of weight  Anxiety state  Loss of hair  Pneumonia of both lower lobes due to infectious organism     Discharge Instructions     Call your new PCP  tomorrow to get follow up testing advice Take  the atenolol to slow the heart rate Avoid caffeine     ED Prescriptions    None     PDMP not reviewed this encounter.   Raylene Everts, MD 05/07/19 [phone]

## 2019-05-07 NOTE — Discharge Instructions (Addendum)
You need to go to the ER

## 2019-05-07 NOTE — ED Triage Notes (Signed)
Pt c/o SOB, heart palpitations since October; Pt states she has been under a lot of stress this past year. Pt states her hair has been falling out. Has lost about 80 pounds since March.  Seen at [facility] today and they called EMS for tachycardia, EMS referred pt to follow up here or ED.  States h/o social anxiety, depressive episodes.   Denies n/v; diaphoresis, dizziness or cp.

## 2019-05-08 ENCOUNTER — Encounter (HOSPITAL_COMMUNITY): Payer: Self-pay | Admitting: Emergency Medicine

## 2019-05-08 ENCOUNTER — Telehealth: Payer: Self-pay | Admitting: Family Medicine

## 2019-05-08 ENCOUNTER — Emergency Department (HOSPITAL_COMMUNITY): Payer: Medicaid Other

## 2019-05-08 DIAGNOSIS — F419 Anxiety disorder, unspecified: Secondary | ICD-10-CM | POA: Diagnosis present

## 2019-05-08 DIAGNOSIS — J189 Pneumonia, unspecified organism: Secondary | ICD-10-CM

## 2019-05-08 DIAGNOSIS — F329 Major depressive disorder, single episode, unspecified: Secondary | ICD-10-CM | POA: Diagnosis present

## 2019-05-08 DIAGNOSIS — E0590 Thyrotoxicosis, unspecified without thyrotoxic crisis or storm: Secondary | ICD-10-CM

## 2019-05-08 DIAGNOSIS — R000 Tachycardia, unspecified: Secondary | ICD-10-CM | POA: Diagnosis not present

## 2019-05-08 DIAGNOSIS — Z79899 Other long term (current) drug therapy: Secondary | ICD-10-CM | POA: Diagnosis not present

## 2019-05-08 DIAGNOSIS — F32A Depression, unspecified: Secondary | ICD-10-CM

## 2019-05-08 DIAGNOSIS — Z833 Family history of diabetes mellitus: Secondary | ICD-10-CM | POA: Diagnosis not present

## 2019-05-08 DIAGNOSIS — F321 Major depressive disorder, single episode, moderate: Secondary | ICD-10-CM | POA: Insufficient documentation

## 2019-05-08 DIAGNOSIS — Z8249 Family history of ischemic heart disease and other diseases of the circulatory system: Secondary | ICD-10-CM | POA: Diagnosis not present

## 2019-05-08 DIAGNOSIS — R0602 Shortness of breath: Secondary | ICD-10-CM | POA: Diagnosis not present

## 2019-05-08 DIAGNOSIS — Z8349 Family history of other endocrine, nutritional and metabolic diseases: Secondary | ICD-10-CM | POA: Diagnosis not present

## 2019-05-08 DIAGNOSIS — Z809 Family history of malignant neoplasm, unspecified: Secondary | ICD-10-CM | POA: Diagnosis not present

## 2019-05-08 DIAGNOSIS — Z20822 Contact with and (suspected) exposure to covid-19: Secondary | ICD-10-CM | POA: Diagnosis present

## 2019-05-08 DIAGNOSIS — E0591 Thyrotoxicosis, unspecified with thyrotoxic crisis or storm: Secondary | ICD-10-CM | POA: Insufficient documentation

## 2019-05-08 DIAGNOSIS — Z818 Family history of other mental and behavioral disorders: Secondary | ICD-10-CM | POA: Diagnosis not present

## 2019-05-08 DIAGNOSIS — Z23 Encounter for immunization: Secondary | ICD-10-CM | POA: Diagnosis not present

## 2019-05-08 HISTORY — DX: Pneumonia, unspecified organism: J18.9

## 2019-05-08 LAB — RESPIRATORY PANEL BY PCR

## 2019-05-08 LAB — CBC
HCT: 35.2 % — ABNORMAL LOW (ref 36.0–46.0)
Hemoglobin: 10.5 g/dL — ABNORMAL LOW (ref 12.0–15.0)
MCH: 25.2 pg — ABNORMAL LOW (ref 26.0–34.0)
MCHC: 29.8 g/dL — ABNORMAL LOW (ref 30.0–36.0)
MCV: 84.4 fL (ref 80.0–100.0)
Platelets: 447 10*3/uL — ABNORMAL HIGH (ref 150–400)
RBC: 4.17 MIL/uL (ref 3.87–5.11)
RDW: 15.5 % (ref 11.5–15.5)
WBC: 13.5 10*3/uL — ABNORMAL HIGH (ref 4.0–10.5)
nRBC: 0 % (ref 0.0–0.2)

## 2019-05-08 LAB — URINALYSIS, ROUTINE W REFLEX MICROSCOPIC
Bilirubin Urine: NEGATIVE
Glucose, UA: NEGATIVE mg/dL
Ketones, ur: 20 mg/dL — AB
Leukocytes,Ua: NEGATIVE
Nitrite: NEGATIVE
Protein, ur: NEGATIVE mg/dL
Specific Gravity, Urine: 1.036 — ABNORMAL HIGH (ref 1.005–1.030)
pH: 6 (ref 5.0–8.0)

## 2019-05-08 LAB — HIV ANTIBODY (ROUTINE TESTING W REFLEX): HIV Screen 4th Generation wRfx: NONREACTIVE

## 2019-05-08 LAB — BASIC METABOLIC PANEL
Anion gap: 9 (ref 5–15)
BUN: <5 mg/dL — ABNORMAL LOW (ref 6–20)
CO2: 22 mmol/L (ref 22–32)
Calcium: 8.6 mg/dL — ABNORMAL LOW (ref 8.9–10.3)
Chloride: 106 mmol/L (ref 98–111)
Creatinine, Ser: 0.48 mg/dL (ref 0.44–1.00)
GFR calc Af Amer: >60 mL/min (ref >60)
GFR calc non Af Amer: >60 mL/min (ref >60)
Glucose, Bld: 127 mg/dL — ABNORMAL HIGH (ref 70–99)
Potassium: 3.9 mmol/L (ref 3.5–5.1)
Sodium: 137 mmol/L (ref 135–145)

## 2019-05-08 LAB — BRAIN NATRIURETIC PEPTIDE: B Natriuretic Peptide: 15.2 pg/mL (ref 0.0–100.0)

## 2019-05-08 LAB — T4, FREE: Free T4: 1.7 ng/dL — ABNORMAL HIGH (ref 0.61–1.12)

## 2019-05-08 LAB — TSH: TSH: <0.01 u[IU]/mL — ABNORMAL LOW (ref 0.350–4.500)

## 2019-05-08 LAB — SARS CORONAVIRUS 2 (TAT 6-24 HRS): SARS Coronavirus 2: NEGATIVE

## 2019-05-08 MED ORDER — ONDANSETRON HCL 4 MG/2ML IJ SOLN: 4.0000 mg | Freq: Four times a day (QID) | INTRAMUSCULAR | Status: DC | PRN

## 2019-05-08 MED ORDER — ACETAMINOPHEN 650 MG RE SUPP: 650.0000 mg | Freq: Four times a day (QID) | RECTAL | Status: DC | PRN

## 2019-05-08 MED ORDER — SODIUM CHLORIDE 0.9% FLUSH: 3.0000 mL | INTRAVENOUS | Status: DC | PRN

## 2019-05-08 MED ORDER — IOHEXOL 350 MG/ML SOLN
80.0000 mL | Freq: Once | INTRAVENOUS | Status: AC | PRN
  Administered 2019-05-08: 80 mL via INTRAVENOUS

## 2019-05-08 MED ORDER — DEXTROSE-NACL 5-0.9 % IV SOLN: INTRAVENOUS | Status: DC

## 2019-05-08 MED ORDER — ACETAMINOPHEN 325 MG PO TABS: 650.0000 mg | ORAL_TABLET | Freq: Four times a day (QID) | ORAL | Status: DC | PRN

## 2019-05-08 MED ORDER — SODIUM CHLORIDE 0.9 % IV SOLN
500.0000 mg | Freq: Once | INTRAVENOUS | Status: AC
  Administered 2019-05-08: 500 mg via INTRAVENOUS
  Filled 2019-05-08: qty 500

## 2019-05-08 MED ORDER — SODIUM CHLORIDE 0.9 % IV SOLN
1.0000 g | INTRAVENOUS | Status: DC
  Administered 2019-05-08 – 2019-05-09 (×2): 1 g via INTRAVENOUS
  Filled 2019-05-08 (×2): qty 10
  Filled 2019-05-08: qty 1

## 2019-05-08 MED ORDER — GUAIFENESIN-DM 100-10 MG/5ML PO SYRP: 5.0000 mL | ORAL_SOLUTION | ORAL | Status: DC | PRN

## 2019-05-08 MED ORDER — ENOXAPARIN SODIUM 40 MG/0.4ML ~~LOC~~ SOLN
40.0000 mg | SUBCUTANEOUS | Status: DC
  Administered 2019-05-08 – 2019-05-10 (×3): 40 mg via SUBCUTANEOUS
  Filled 2019-05-08 (×3): qty 0.4

## 2019-05-08 MED ORDER — SODIUM CHLORIDE 0.9 % IV SOLN
1.0000 g | Freq: Once | INTRAVENOUS | Status: AC
  Administered 2019-05-08: 1 g via INTRAVENOUS
  Filled 2019-05-08: qty 10

## 2019-05-08 MED ORDER — ONDANSETRON HCL 4 MG PO TABS: 4.0000 mg | ORAL_TABLET | Freq: Four times a day (QID) | ORAL | Status: DC | PRN

## 2019-05-08 MED ORDER — ADULT MULTIVITAMIN W/MINERALS CH
1.0000 | ORAL_TABLET | Freq: Every day | ORAL | Status: DC
  Administered 2019-05-08 – 2019-05-10 (×3): 1 via ORAL
  Filled 2019-05-08 (×3): qty 1

## 2019-05-08 MED ORDER — METHIMAZOLE 5 MG PO TABS
5.0000 mg | ORAL_TABLET | Freq: Three times a day (TID) | ORAL | Status: DC
  Administered 2019-05-08 – 2019-05-10 (×8): 5 mg via ORAL
  Filled 2019-05-08 (×10): qty 1

## 2019-05-08 MED ORDER — SODIUM CHLORIDE 0.9 % IV SOLN: 250.0000 mL | INTRAVENOUS | Status: DC | PRN

## 2019-05-08 MED ORDER — LORAZEPAM 0.5 MG PO TABS: 0.5000 mg | ORAL_TABLET | Freq: Four times a day (QID) | ORAL | Status: DC | PRN

## 2019-05-08 MED ORDER — INFLUENZA VAC SPLIT QUAD 0.5 ML IM SUSY
0.5000 mL | PREFILLED_SYRINGE | INTRAMUSCULAR | Status: AC
  Administered 2019-05-09: 0.5 mL via INTRAMUSCULAR
  Filled 2019-05-08: qty 0.5

## 2019-05-08 MED ORDER — PROPRANOLOL HCL 40 MG PO TABS
40.0000 mg | ORAL_TABLET | Freq: Four times a day (QID) | ORAL | Status: DC
  Administered 2019-05-08 – 2019-05-10 (×10): 40 mg via ORAL
  Filled 2019-05-08 (×11): qty 1

## 2019-05-08 MED ORDER — PROPRANOLOL HCL ER 60 MG PO CP24
60.0000 mg | ORAL_CAPSULE | ORAL | Status: AC
  Administered 2019-05-08: 60 mg via ORAL
  Filled 2019-05-08: qty 1

## 2019-05-08 MED ORDER — SODIUM CHLORIDE 0.9% FLUSH
3.0000 mL | Freq: Two times a day (BID) | INTRAVENOUS | Status: DC
  Administered 2019-05-08 – 2019-05-10 (×5): 3 mL via INTRAVENOUS

## 2019-05-08 MED ORDER — SODIUM CHLORIDE 0.9 % IV SOLN
500.0000 mg | INTRAVENOUS | Status: DC
  Administered 2019-05-08 – 2019-05-09 (×2): 500 mg via INTRAVENOUS
  Filled 2019-05-08 (×3): qty 500

## 2019-05-08 NOTE — Telephone Encounter (Signed)
Forwarding message to med asst/ Kenney Houseman N that Jearld Shines w/ [facility] returned her call --- Pls feel free to contact him @ [phone].  --glh

## 2019-05-08 NOTE — Progress Notes (Signed)
Patient reports feeling slightly better, not longer having palpitations and shortness of breath is improving. HR staying the 80s to 90s range now.

## 2019-05-08 NOTE — H&P (Signed)
History and Physical    Sarah Chandler RXV:[id number] DOB: [birth year] DOA: 05/07/2019  PCP: Mellody Dance, DO    Patient coming from: Home    Chief Complaint: Shortness of breath, palpitations ED NOTE Patient states that starting in October she noticed that she was having tachycardia with exertion.  Her exercise tolerance went down.  She states that at times she would have some shortness of breath.  Since that time the symptoms have become worse.  She has lost weight.  She states her hair is thinning.  She states that she is having more anxiety and depression.  She has anxiety when her heart rate is high.  She states that she thought at first it was from stress.  Then she realized it was a medical issue, and friend suggested she get her thyroid checked.  She made an appointment with a primary care doctor.  Today was her first visit.  She arrived at the primary care doctor and they noted immediately she had abnormal vital signs.  Heart rate 154, pulse ox 91, respiratory rate 24.  They called EMS with directions to take her to the emergency room.   HPI: Sarah Chandler is a 34 y.o. female with medical history significant of anxiety, depression came with a chief complaint of shortness of breath, palpitation.  She was seen by his urgent care physician for evaluation after she found pneumonia on the chest x-ray.  ED Course:  In the emergency room She had a CT scan angio rule out PE which show bilateral infiltrates Rapid test Covid negative Respiratory panel pending Electrocardiogram sinus tachycardia rate 140 no acute ST-T changes TSH 0.010 with a T4 1.7 Patient received IV fluids antibiotics Rocephin and Zithromax Propanolol for tachycardia  Review of Systems: As per HPI otherwise 10 point review of systems negative.    History reviewed. No pertinent past medical history.  History reviewed. No pertinent surgical history.   reports that she has never smoked. She has never used  smokeless tobacco. She reports that she does not drink alcohol or use drugs.  Allergies  Allergen Reactions  . Enfamil Reguline-Iron [Alimentum]     unknown    Family History  Problem Relation Age of Onset  . Diabetes Father   . Hyperlipidemia Father   . Hypertension Father   . Depression Maternal Uncle   . Diabetes Maternal Uncle   . Cancer Maternal Grandmother   . Hyperlipidemia Maternal Grandmother   . Cancer Paternal Grandfather      Prior to Admission medications   Medication Sig Start Date End Date Taking? Authorizing Provider  guaiFENesin-dextromethorphan (ROBITUSSIN DM) 100-10 MG/5ML syrup Take 5 mLs by mouth every 4 (four) hours as needed for cough (PRN cough).   Yes [provider]  Multiple Vitamin (MULTIVITAMIN WITH MINERALS) TABS tablet Take 1 tablet by mouth daily.   Yes [provider]  OVER THE COUNTER MEDICATION CBD gummies taking 2 daily   Yes [provider]    Physical Exam: Vitals:   05/08/19 0300 05/08/19 0400 05/08/19 0447 05/08/19 0450  BP: 116/81 122/79 112/67 112/67  Pulse: (!) 103 91 100 99  Resp: 19 (!) 21 20 (!) 21  Temp:   97.7 F (36.5 C)   TempSrc:   Oral   SpO2: 95% 96% 90% 92%  Weight:   88.7 kg   Height:   5\' 10"  (1.778 m)     Constitutional: NAD, calm, comfortable Vitals:   05/08/19 0300 05/08/19 0400 05/08/19 0447  05/08/19 0450  BP: 116/81 122/79 112/67 112/67  Pulse: (!) 103 91 100 99  Resp: 19 (!) 21 20 (!) 21  Temp:   97.7 F (36.5 C)   TempSrc:   Oral   SpO2: 95% 96% 90% 92%  Weight:   88.7 kg   Height:   5\' 10"  (1.778 m)    Eyes: PERRL, lids and conjunctivae normal ENMT: Mucous membranes are moist. Posterior pharynx clear of any exudate or lesions.Normal dentition.  Neck: normal, supple, no masses, no thyromegaly Respiratory: clear to auscultation bilaterally, no wheezing, no crackles. Normal respiratory effort. No accessory muscle use.  Cardiovascular: Regular rate and rhythm, no murmurs /  rubs / gallops. No extremity edema. 2+ pedal pulses. No carotid bruits.  Abdomen: no tenderness, no masses palpated. No hepatosplenomegaly. Bowel sounds positive.  Musculoskeletal: no clubbing / cyanosis. No joint deformity upper and lower extremities. Good ROM, no contractures. Normal muscle tone.  Skin: no rashes, lesions, ulcers. No induration Neurologic: CN 2-12 grossly intact. Sensation intact, DTR normal. Strength 5/5 in all 4.  Psychiatric: Normal judgment and insight. Alert and oriented x 3. Normal mood.    Labs on Admission: I have personally reviewed following labs and imaging studies  CBC: Recent Labs  Lab 05/07/19 2133 05/08/19 0344  WBC 16.7* 13.5*  NEUTROABS 12.3*  --   HGB 11.9* 10.5*  HCT 38.7 35.2*  MCV 81.8 84.4  PLT 575* 161*   Basic Metabolic Panel: Recent Labs  Lab 05/07/19 2133 05/08/19 0344  NA 138 137  K 3.8 3.9  CL 105 106  CO2 21* 22  GLUCOSE 117* 127*  BUN 7 <5*  CREATININE 0.63 0.48  CALCIUM 9.4 8.6*   GFR: Estimated Creatinine Clearance: 122.1 mL/min (by C-G formula based on SCr of 0.48 mg/dL). Liver Function Tests: Recent Labs  Lab 05/07/19 2133  AST 26  ALT 31  ALKPHOS 82  BILITOT 0.4  PROT 7.9  ALBUMIN 3.2*   No results for input(s): LIPASE, AMYLASE in the last 168 hours. No results for input(s): AMMONIA in the last 168 hours. Coagulation Profile: No results for input(s): INR, PROTIME in the last 168 hours. Cardiac Enzymes: No results for input(s): CKTOTAL, CKMB, CKMBINDEX, TROPONINI in the last 168 hours. BNP (last 3 results) No results for input(s): PROBNP in the last 8760 hours. HbA1C: No results for input(s): HGBA1C in the last 72 hours. CBG: No results for input(s): GLUCAP in the last 168 hours. Lipid Profile: No results for input(s): CHOL, HDL, LDLCALC, TRIG, CHOLHDL, LDLDIRECT in the last 72 hours. Thyroid Function Tests: Recent Labs    05/07/19 2310 05/07/19 2319  TSH  --  <0.010*  FREET4 1.70*  --     Anemia Panel: No results for input(s): VITAMINB12, FOLATE, FERRITIN, TIBC, IRON, RETICCTPCT in the last 72 hours. Urine analysis:    Component Value Date/Time   COLORURINE YELLOW 05/08/2019 0125   APPEARANCEUR HAZY (A) 05/08/2019 0125   LABSPEC 1.036 (H) 05/08/2019 0125   PHURINE 6.0 05/08/2019 0125   GLUCOSEU NEGATIVE 05/08/2019 0125   HGBUR LARGE (A) 05/08/2019 0125   BILIRUBINUR NEGATIVE 05/08/2019 0125   KETONESUR 20 (A) 05/08/2019 0125   PROTEINUR NEGATIVE 05/08/2019 0125   NITRITE NEGATIVE 05/08/2019 0125   LEUKOCYTESUR NEGATIVE 05/08/2019 0125    Radiological Exams on Admission: DG Chest 2 View  Result Date: 05/07/2019 CLINICAL DATA:  Tachycardia EXAM: CHEST - 2 VIEW COMPARISON:  None. FINDINGS: Low lung volumes. Fairly extensive bilateral airspace disease, greatest within the  lower lungs. Small pleural effusions. Normal heart size. No pneumothorax. IMPRESSION: Fairly extensive bilateral lower lung airspace disease, likely representing bilateral pneumonia. Probable small pleural effusions. Electronically Signed   By: Donavan Foil M.D.   On: 05/07/2019 18:49   CT Angio Chest PE W and/or Wo Contrast  Result Date: 05/08/2019 CLINICAL DATA:  Shortness of breath, PE suspected, high probability EXAM: CT ANGIOGRAPHY CHEST WITH CONTRAST TECHNIQUE: Multidetector CT imaging of the chest was performed using the standard protocol during bolus administration of intravenous contrast. Multiplanar CT image reconstructions and MIPs were obtained to evaluate the vascular anatomy. CONTRAST:  32mL OMNIPAQUE IOHEXOL 350 MG/ML SOLN COMPARISON:  Chest radiograph 05/07/2019 FINDINGS: Cardiovascular: Satisfactory opacification the pulmonary arteries to the segmental level. No pulmonary artery filling defects are identified. Central pulmonary arteries are normal caliber. Normal heart size. No pericardial effusion. The aorta is normal caliber. Normal 3 vessel branching of the arch. Mediastinum/Nodes:  Likely reactive subcentimeter low-attenuation mediastinal and hilar nodes are present. No pathologically enlarged enlarged mediastinal or, hilar axillary lymph nodes. Thyroid gland, trachea, and esophagus demonstrate no significant findings. Lungs/Pleura: Extensive peripheral predominant areas of patchy consolidated airspace disease throughout both lungs. Some superimposed areas of interlobular septal thickening and fissural thickening are noted as well. Trace pleural effusion is seen in both lung bases. Upper Abdomen: No acute abnormalities present in the visualized portions of the upper abdomen. Musculoskeletal: Multilevel degenerative changes are present in the imaged portions of the spine. No acute osseous abnormality or suspicious osseous lesion. Review of the MIP images confirms the above findings. IMPRESSION: 1. No evidence of acute pulmonary artery filling defects. 2. Extensive peripheral predominant areas of patchy consolidated airspace disease throughout both lungs, likely representing multifocal pneumonia. 3. Septal thickening with trace bilateral effusions may suggest some superimposed edema as well. Electronically Signed   By: Lovena Le M.D.   On: 05/08/2019 00:18    EKG: Independently reviewed.  Sinus tachycardia no acute ST-T changes  Assessment and plan  Community-acquired pneumonia Covid rapid test negative, respiratory panel pending CT scan negative for PE, multifocal pneumonia Plan IV fluids Rocephin and Zithromax  Hyperthyroidism Very low TSH high T4 No criteria for thyroid storm Rule out hyperthyroidism, thyrotoxicosis factica, first phase of thyroiditis T3, thyroid uptake, endocrinology consult IV fluids, propanolol for tachycardia very small dose of ATD,s not sure if it is necessary, sedation   Assessment/Plan Active Problems:   Community acquired pneumonia   Depression   Hyperthyroidism      DVT prophylaxis: Lovenox Code Status: Full code Family  Communication: None Disposition Plan: At home Consults called: Need to call endocrinology Admission status: Full admission   Linard Millers Gwendelyn Lanting MD [facility]  If 7PM-7AM, please contact night-coverage [email or web address]   05/08/2019, 7:12 AM

## 2019-05-08 NOTE — Plan of Care (Signed)
  Problem: Activity: Goal: Ability to tolerate increased activity will improve Outcome: Progressing   Problem: Clinical Measurements: Goal: Ability to maintain a body temperature in the normal range will improve Outcome: Progressing   

## 2019-05-09 DIAGNOSIS — R0602 Shortness of breath: Secondary | ICD-10-CM

## 2019-05-09 DIAGNOSIS — J189 Pneumonia, unspecified organism: Principal | ICD-10-CM

## 2019-05-09 DIAGNOSIS — R000 Tachycardia, unspecified: Secondary | ICD-10-CM

## 2019-05-09 DIAGNOSIS — E0590 Thyrotoxicosis, unspecified without thyrotoxic crisis or storm: Secondary | ICD-10-CM

## 2019-05-09 LAB — BASIC METABOLIC PANEL
Anion gap: 5 (ref 5–15)
BUN: <5 mg/dL — ABNORMAL LOW (ref 6–20)
CO2: 23 mmol/L (ref 22–32)
Calcium: 8.3 mg/dL — ABNORMAL LOW (ref 8.9–10.3)
Chloride: 111 mmol/L (ref 98–111)
Creatinine, Ser: 0.43 mg/dL — ABNORMAL LOW (ref 0.44–1.00)
GFR calc Af Amer: >60 mL/min (ref >60)
GFR calc non Af Amer: >60 mL/min (ref >60)
Glucose, Bld: 98 mg/dL (ref 70–99)
Potassium: 3.5 mmol/L (ref 3.5–5.1)
Sodium: 139 mmol/L (ref 135–145)

## 2019-05-09 LAB — T3: T3, Total: 149 ng/dL (ref 71–180)

## 2019-05-09 LAB — CBC
HCT: 31.5 % — ABNORMAL LOW (ref 36.0–46.0)
Hemoglobin: 9.8 g/dL — ABNORMAL LOW (ref 12.0–15.0)
MCH: 25.1 pg — ABNORMAL LOW (ref 26.0–34.0)
MCHC: 31.1 g/dL (ref 30.0–36.0)
MCV: 80.8 fL (ref 80.0–100.0)
Platelets: 429 10*3/uL — ABNORMAL HIGH (ref 150–400)
RBC: 3.9 MIL/uL (ref 3.87–5.11)
RDW: 15.9 % — ABNORMAL HIGH (ref 11.5–15.5)
WBC: 9.7 10*3/uL (ref 4.0–10.5)
nRBC: 0 % (ref 0.0–0.2)

## 2019-05-09 LAB — T3 UPTAKE: T3 Uptake Ratio: 32 % (ref 24–39)

## 2019-05-09 LAB — T3, FREE: T3, Free: 4.1 pg/mL (ref 2.0–4.4)

## 2019-05-09 NOTE — Progress Notes (Signed)
PROGRESS NOTE    Sarah Chandler  ZOX:[id number] DOB: 11-Jul-1985 DOA: 05/07/2019 PCP: Mellody Dance, DO  Brief Narrative:  ED note: Patient states that starting in October she noticed that she was having tachycardia with exertion. Her exercise tolerance went down. She states that at times she would have some shortness of breath. Since that time the symptoms have become worse. She has lost weight. She states her hair is thinning. She states that she is having more anxiety and depression. She has anxiety when her heart rate is high. She states that she thought at first it was from stress. Then she realized it was a medical issue, and friend suggested she get her thyroid checked. She made an appointment with a primary care doctor. Today was her first visit. She arrived at the primary care doctor and they noted immediately she had abnormal vital signs. Heart rate 154, pulse ox 91, respiratory rate 24. They called EMS with directions to take her to the emergency room.  HPI: Sarah Chandler is a 34 y.o. female with medical history significant of anxiety, depression came with a chief complaint of shortness of breath, palpitation.  She was seen by his urgent care physician for evaluation after she found pneumonia on the chest x-ray.  ED Course:  She had a CT scan angio rule out PE which show bilateral infiltrates Rapid test Covid negative Electrocardiogram sinus tachycardia rate 140 no acute ST-T changes TSH 0.010 with a T4 1.7 Patient received IV fluids antibiotics Rocephin and Zithromax Propanolol for tachycardia  Assessment & Plan:   Active Problems:   Community acquired pneumonia   Depression   Hyperthyroidism   Shortness of breath   Tachycardia   Community-acquired pneumonia Covid rapid test negative CT scan negative for PE, multifocal pneumonia Continue Rocephin and Zithromax Discontinue IV fluids as she is now tolerating oral hydration well.    Hyperthyroidism Very low TSH high T4 T3, thyroid uptake pending.  Continue methimazole, BB.  Patient reports feeling better, her HR is improving, down to 80s.  She denies shortness of breath.     DVT prophylaxis: Lovenox Code Status: Full Family Communication: Discussed with family at the bedside Disposition Plan: Plan to discharge home in 1-2 days after treatment of CAP and if HR remains stable.   Consultants:   None  Procedures:  None Antimicrobials:  Azithromycin, Rocephin   Subjective: Patient with improvement of her tachycardia and dyspnea. Reports feeling better. Able to tolerate more oral intake. Denies nausea, vomiting, or diarrhea.   Objective: Vitals:   05/09/19 0552 05/09/19 0859 05/09/19 0900 05/09/19 1233  BP: 125/71 120/67 120/67 (P) 124/76  Pulse: 92 89 78 80  Resp: (!) 25 (!) 26 20 20   Temp: 97.6 F (36.4 C) (!) 97.5 F (36.4 C)  98 F (36.7 C)  TempSrc: Oral Oral  Oral  SpO2: 96% 95% 96% 95%  Weight:      Height:        Intake/Output Summary (Last 24 hours) at 05/09/2019 1811 Last data filed at 05/09/2019 1606 Gross per 24 hour  Intake 4151.5 ml  Output --  Net 4151.5 ml   Filed Weights   05/08/19 0447  Weight: 88.7 kg    Examination:  General exam: Appears calm and comfortable  Respiratory system:  Respiratory effort normal. Cardiovascular system: S1 & S2 present, RRR.  Gastrointestinal system: Abdomen is nondistended, soft and nontender.  Central nervous system: Alert and oriented x3. No focal neurological deficits. Extremities: Symmetric 5 x  5 power. Skin: No rashes, lesions or ulcers Psychiatry:  Mood & affect appropriate.     Data Reviewed: I have personally reviewed following labs and imaging studies  CBC: Recent Labs  Lab 05/07/19 2133 05/08/19 0344 05/09/19 0320  WBC 16.7* 13.5* 9.7  NEUTROABS 12.3*  --   --   HGB 11.9* 10.5* 9.8*  HCT 38.7 35.2* 31.5*  MCV 81.8 84.4 80.8  PLT 575* 447* 429*   Basic  Metabolic Panel: Recent Labs  Lab 05/07/19 2133 05/08/19 0344 05/09/19 0320  NA 138 137 139  K 3.8 3.9 3.5  CL 105 106 111  CO2 21* 22 23  GLUCOSE 117* 127* 98  BUN 7 <5* <5*  CREATININE 0.63 0.48 0.43*  CALCIUM 9.4 8.6* 8.3*   GFR: Estimated Creatinine Clearance: 122.1 mL/min (A) (by C-G formula based on SCr of 0.43 mg/dL (L)). Liver Function Tests: Recent Labs  Lab 05/07/19 2133  AST 26  ALT 31  ALKPHOS 82  BILITOT 0.4  PROT 7.9  ALBUMIN 3.2*   No results for input(s): LIPASE, AMYLASE in the last 168 hours. No results for input(s): AMMONIA in the last 168 hours. Coagulation Profile: No results for input(s): INR, PROTIME in the last 168 hours. Cardiac Enzymes: No results for input(s): CKTOTAL, CKMB, CKMBINDEX, TROPONINI in the last 168 hours. BNP (last 3 results) No results for input(s): PROBNP in the last 8760 hours. HbA1C: No results for input(s): HGBA1C in the last 72 hours. CBG: No results for input(s): GLUCAP in the last 168 hours. Lipid Profile: No results for input(s): CHOL, HDL, LDLCALC, TRIG, CHOLHDL, LDLDIRECT in the last 72 hours. Thyroid Function Tests: Recent Labs    05/07/19 2310 05/07/19 2319 05/08/19 0316  TSH  --  <0.010*  --   FREET4 1.70*  --   --   T3FREE  --   --  4.1   Anemia Panel: No results for input(s): VITAMINB12, FOLATE, FERRITIN, TIBC, IRON, RETICCTPCT in the last 72 hours. Sepsis Labs: No results for input(s): PROCALCITON, LATICACIDVEN in the last 168 hours.  Recent Results (from the past 240 hour(s))  SARS CORONAVIRUS 2 (TAT 6-24 HRS) Nasopharyngeal Nasopharyngeal Swab     Status: None   Collection Time: 05/08/19  3:10 AM   Specimen: Nasopharyngeal Swab  Result Value Ref Range Status   SARS Coronavirus 2 NEGATIVE NEGATIVE Final    Comment: (NOTE) SARS-CoV-2 target nucleic acids are NOT DETECTED. The SARS-CoV-2 RNA is generally detectable in upper and lower respiratory specimens during the acute phase of infection.  Negative results do not preclude SARS-CoV-2 infection, do not rule out co-infections with other pathogens, and should not be used as the sole basis for treatment or other patient management decisions. Negative results must be combined with clinical observations, patient history, and epidemiological information. The expected result is Negative. Fact Sheet for Patients: SugarRoll.be Fact Sheet for Healthcare Providers: https://www.woods-mathews.com/ This test is not yet approved or cleared by the [place] FDA and  has been authorized for detection and/or diagnosis of SARS-CoV-2 by FDA under an Emergency Use Authorization (EUA). This EUA will remain  in effect (meaning this test can be used) for the duration of the COVID-19 declaration under Section 56 4(b)(1) of the Act, 21 U.S.C. section 360bbb-3(b)(1), unless the authorization is terminated or revoked sooner. Performed at [facility], [place] [address] [place], [place] [address]   Respiratory Panel by PCR     Status: None   Collection Time: 05/08/19  4:23 AM  Specimen: Nasopharyngeal Swab; Respiratory  Result Value Ref Range Status   Adenovirus NOT DETECTED NOT DETECTED Final   Coronavirus 229E NOT DETECTED NOT DETECTED Final    Comment: (NOTE) The Coronavirus on the Respiratory Panel, DOES NOT test for the novel  Coronavirus (2019 nCoV)    Coronavirus HKU1 NOT DETECTED NOT DETECTED Final   Coronavirus NL63 NOT DETECTED NOT DETECTED Final   Coronavirus OC43 NOT DETECTED NOT DETECTED Final   Metapneumovirus NOT DETECTED NOT DETECTED Final   Rhinovirus / Enterovirus NOT DETECTED NOT DETECTED Final   Influenza A NOT DETECTED NOT DETECTED Final   Influenza B NOT DETECTED NOT DETECTED Final   Parainfluenza Virus 1 NOT DETECTED NOT DETECTED Final   Parainfluenza Virus 2 NOT DETECTED NOT DETECTED Final   Parainfluenza Virus 3 NOT DETECTED NOT DETECTED Final   Parainfluenza Virus 4  NOT DETECTED NOT DETECTED Final   Respiratory Syncytial Virus NOT DETECTED NOT DETECTED Final   Bordetella pertussis NOT DETECTED NOT DETECTED Final   Chlamydophila pneumoniae NOT DETECTED NOT DETECTED Final   Mycoplasma pneumoniae NOT DETECTED NOT DETECTED Final    Comment: Performed at [facility], [place]. [address] [place], [place] [address]         Radiology Studies: DG Chest 2 View  Result Date: 05/07/2019 CLINICAL DATA:  Tachycardia EXAM: CHEST - 2 VIEW COMPARISON:  None. FINDINGS: Low lung volumes. Fairly extensive bilateral airspace disease, greatest within the lower lungs. Small pleural effusions. Normal heart size. No pneumothorax. IMPRESSION: Fairly extensive bilateral lower lung airspace disease, likely representing bilateral pneumonia. Probable small pleural effusions. Electronically Signed   By: Donavan Foil M.D.   On: 05/07/2019 18:49   CT Angio Chest PE W and/or Wo Contrast  Result Date: 05/08/2019 CLINICAL DATA:  Shortness of breath, PE suspected, high probability EXAM: CT ANGIOGRAPHY CHEST WITH CONTRAST TECHNIQUE: Multidetector CT imaging of the chest was performed using the standard protocol during bolus administration of intravenous contrast. Multiplanar CT image reconstructions and MIPs were obtained to evaluate the vascular anatomy. CONTRAST:  71mL OMNIPAQUE IOHEXOL 350 MG/ML SOLN COMPARISON:  Chest radiograph 05/07/2019 FINDINGS: Cardiovascular: Satisfactory opacification the pulmonary arteries to the segmental level. No pulmonary artery filling defects are identified. Central pulmonary arteries are normal caliber. Normal heart size. No pericardial effusion. The aorta is normal caliber. Normal 3 vessel branching of the arch. Mediastinum/Nodes: Likely reactive subcentimeter low-attenuation mediastinal and hilar nodes are present. No pathologically enlarged enlarged mediastinal or, hilar axillary lymph nodes. Thyroid gland, trachea, and esophagus demonstrate no  significant findings. Lungs/Pleura: Extensive peripheral predominant areas of patchy consolidated airspace disease throughout both lungs. Some superimposed areas of interlobular septal thickening and fissural thickening are noted as well. Trace pleural effusion is seen in both lung bases. Upper Abdomen: No acute abnormalities present in the visualized portions of the upper abdomen. Musculoskeletal: Multilevel degenerative changes are present in the imaged portions of the spine. No acute osseous abnormality or suspicious osseous lesion. Review of the MIP images confirms the above findings. IMPRESSION: 1. No evidence of acute pulmonary artery filling defects. 2. Extensive peripheral predominant areas of patchy consolidated airspace disease throughout both lungs, likely representing multifocal pneumonia. 3. Septal thickening with trace bilateral effusions may suggest some superimposed edema as well. Electronically Signed   By: Lovena Le M.D.   On: 05/08/2019 00:18        Scheduled Meds:  enoxaparin (LOVENOX) injection  40 mg Subcutaneous Q24H   methimazole  5 mg Oral TID  multivitamin with minerals  1 tablet Oral Daily   propranolol  40 mg Oral QID   sodium chloride flush  3 mL Intravenous Q12H   Continuous Infusions:  sodium chloride     azithromycin Stopped (05/08/19 2354)   cefTRIAXone (ROCEPHIN)  IV Stopped (05/08/19 2205)     LOS: 1 day    Time spent: 35 minutes    Blain Pais, MD [facility]   If 7PM-7AM, please contact night-coverage [email or web address] Password [facility] 05/09/2019, 6:11 PM

## 2019-05-10 LAB — BASIC METABOLIC PANEL
Anion gap: 13 (ref 5–15)
BUN: <5 mg/dL — ABNORMAL LOW (ref 6–20)
CO2: 23 mmol/L (ref 22–32)
Calcium: 8.9 mg/dL (ref 8.9–10.3)
Chloride: 105 mmol/L (ref 98–111)
Creatinine, Ser: 0.48 mg/dL (ref 0.44–1.00)
GFR calc Af Amer: >60 mL/min (ref >60)
GFR calc non Af Amer: >60 mL/min (ref >60)
Glucose, Bld: 81 mg/dL (ref 70–99)
Potassium: 3.6 mmol/L (ref 3.5–5.1)
Sodium: 141 mmol/L (ref 135–145)

## 2019-05-10 LAB — CBC
HCT: 34.6 % — ABNORMAL LOW (ref 36.0–46.0)
Hemoglobin: 10.6 g/dL — ABNORMAL LOW (ref 12.0–15.0)
MCH: 24.9 pg — ABNORMAL LOW (ref 26.0–34.0)
MCHC: 30.6 g/dL (ref 30.0–36.0)
MCV: 81.2 fL (ref 80.0–100.0)
Platelets: 503 10*3/uL — ABNORMAL HIGH (ref 150–400)
RBC: 4.26 MIL/uL (ref 3.87–5.11)
RDW: 15.9 % — ABNORMAL HIGH (ref 11.5–15.5)
WBC: 10 10*3/uL (ref 4.0–10.5)
nRBC: 0 % (ref 0.0–0.2)

## 2019-05-10 MED ORDER — CEFDINIR 300 MG PO CAPS: 300.0000 mg | ORAL_CAPSULE | Freq: Two times a day (BID) | ORAL | 0 refills | Status: AC

## 2019-05-10 MED ORDER — METHIMAZOLE 5 MG PO TABS: 5.0000 mg | ORAL_TABLET | Freq: Three times a day (TID) | ORAL | 0 refills | Status: DC

## 2019-05-10 MED ORDER — PROPRANOLOL HCL 40 MG PO TABS: 40.0000 mg | ORAL_TABLET | Freq: Three times a day (TID) | ORAL | 0 refills | Status: DC

## 2019-05-10 NOTE — Progress Notes (Signed)
Orders received to discharge patient.  Telemetry monitor removed and CCMD notified.  PIV access removed.  Discharge instructions, follow up, medications and instructions for their use discussed with patient. 

## 2019-05-10 NOTE — Discharge Summary (Signed)
Physician Discharge Summary  Sarah Chandler MWN:[id number] DOB: May 09, 1985 DOA: 05/07/2019  PCP: Sarah Dance, DO  Admit date: 05/07/2019 Discharge date: 05/10/2019  Admitted From: Home Disposition:  Home  Recommendations for Outpatient Follow-up:  1. Follow up with PCP in 1-2 weeks 2. Please obtain BMP/CBC in one week   Home Health: No Equipment/Devices: None  Discharge Condition: Stable CODE STATUS: Full Diet recommendation: Regular  Brief/Interim Summary: ED note: Patient states that starting in October she noticed that she was having tachycardia with exertion. Her exercise tolerance went down. She states that at times she would have some shortness of breath. Since that time the symptoms have become worse. She has lost weight. She states her hair is thinning. She states that she is having more anxiety and depression. She has anxiety when her heart rate is high. She states that she thought at first it was from stress. Then she realized it was a medical issue, and friend suggested she get her thyroid checked. She made an appointment with a primary care doctor. Today was her first visit. She arrived at the primary care doctor and they noted immediately she had abnormal vital signs. Heart rate 154, pulse ox 91, respiratory rate 24. They called EMS with directions to take her to the emergency room.  Sarah Chandler a 34 y.o.femalewith medical history significant ofanxiety, depression came with a chief complaint of shortness of breath, palpitation. She was seen by his urgent care physician for evaluation after she found pneumonia on the chest x-ray.  ED Course: She had a CT scan angio rule out PE which show bilateral infiltrates Rapid test Covid negative Electrocardiogram sinus tachycardia rate 140 no acute ST-T changes TSH 0.010 with a T4 1.7 Patient received IV fluids antibiotics Rocephin and Azithromax Propanolol started for tachycardia Discharge  Diagnoses:  Active Problems:   Community acquired pneumonia   Depression   Hyperthyroidism   Shortness of breath   Tachycardia   Community-acquired pneumonia Covid rapid test negative CT scan negative for PE, multifocal pneumonia Treated with Rocephin and Zithromax while inpatient, will transition to [facility] after discharge.  Treated with  IV fluids initially but was able to tolerated oral intake well. Appears euvolemic.   Hyperthyroidism Very low TSH high T4 T3,thyroid uptake unremarkable.   Continue methimazole, BB.  Patient reports feeling better, her HR is improving, down to 80s.  She denies shortness of breath.  She will follow up with her PCP and with Endocrinology (referral ordered)  Discharge Instructions  Discharge Instructions    Diet - low sodium heart healthy   Complete by: As directed    Increase activity slowly   Complete by: As directed      Allergies as of 05/10/2019      Reactions   Enfamil Reguline-iron [alimentum]    unknown      Medication List    TAKE these medications   cefdinir 300 MG capsule Commonly known as: OMNICEF Take 1 capsule (300 mg total) by mouth 2 (two) times daily for 5 days.   guaiFENesin-dextromethorphan 100-10 MG/5ML syrup Commonly known as: ROBITUSSIN DM Take 5 mLs by mouth every 4 (four) hours as needed for cough (PRN cough).   methimazole 5 MG tablet Commonly known as: TAPAZOLE Take 1 tablet (5 mg total) by mouth 3 (three) times daily.   multivitamin with minerals Tabs tablet Take 1 tablet by mouth daily.   OVER THE COUNTER MEDICATION CBD gummies taking 2 daily   propranolol 40 MG tablet Commonly known  as: INDERAL Take 1 tablet (40 mg total) by mouth 3 (three) times daily.      Follow-up Information    [name], [name], DO Follow up in 2 day(s).   Specialty: Family Medicine Contact information: [place] [address] [phone]        [facility] Endocrinology. Schedule an  appointment as soon as possible for a visit in 2 week(s).   Specialty: Internal Medicine Contact information: [address] [place] [address] [phone]         Allergies  Allergen Reactions  . Enfamil Reguline-Iron [Alimentum]     unknown    Consultations:  None   Procedures/Studies: DG Chest 2 View  Result Date: 05/07/2019 CLINICAL DATA:  Tachycardia EXAM: CHEST - 2 VIEW COMPARISON:  None. FINDINGS: Low lung volumes. Fairly extensive bilateral airspace disease, greatest within the lower lungs. Small pleural effusions. Normal heart size. No pneumothorax. IMPRESSION: Fairly extensive bilateral lower lung airspace disease, likely representing bilateral pneumonia. Probable small pleural effusions. Electronically Signed   By: Donavan Foil M.D.   On: 05/07/2019 18:49   CT Angio Chest PE W and/or Wo Contrast  Result Date: 05/08/2019 CLINICAL DATA:  Shortness of breath, PE suspected, high probability EXAM: CT ANGIOGRAPHY CHEST WITH CONTRAST TECHNIQUE: Multidetector CT imaging of the chest was performed using the standard protocol during bolus administration of intravenous contrast. Multiplanar CT image reconstructions and MIPs were obtained to evaluate the vascular anatomy. CONTRAST:  18mL OMNIPAQUE IOHEXOL 350 MG/ML SOLN COMPARISON:  Chest radiograph 05/07/2019 FINDINGS: Cardiovascular: Satisfactory opacification the pulmonary arteries to the segmental level. No pulmonary artery filling defects are identified. Central pulmonary arteries are normal caliber. Normal heart size. No pericardial effusion. The aorta is normal caliber. Normal 3 vessel branching of the arch. Mediastinum/Nodes: Likely reactive subcentimeter low-attenuation mediastinal and hilar nodes are present. No pathologically enlarged enlarged mediastinal or, hilar axillary lymph nodes. Thyroid gland, trachea, and esophagus demonstrate no significant findings. Lungs/Pleura: Extensive  peripheral predominant areas of patchy consolidated airspace disease throughout both lungs. Some superimposed areas of interlobular septal thickening and fissural thickening are noted as well. Trace pleural effusion is seen in both lung bases. Upper Abdomen: No acute abnormalities present in the visualized portions of the upper abdomen. Musculoskeletal: Multilevel degenerative changes are present in the imaged portions of the spine. No acute osseous abnormality or suspicious osseous lesion. Review of the MIP images confirms the above findings. IMPRESSION: 1. No evidence of acute pulmonary artery filling defects. 2. Extensive peripheral predominant areas of patchy consolidated airspace disease throughout both lungs, likely representing multifocal pneumonia. 3. Septal thickening with trace bilateral effusions may suggest some superimposed edema as well. Electronically Signed   By: Lovena Le M.D.   On: 05/08/2019 00:18      Subjective: She reports feeling better. Denies shortness of breath. Has no palpitations. HR is stable. BP is stable.  Discharge Exam: Vitals:   05/10/19 0557 05/10/19 0829  BP: 137/78 115/66  Pulse: 77 85  Resp: (!) 21 14  Temp: 97.9 F (36.6 C) 97.6 F (36.4 C)  SpO2: 95% 99%   Vitals:   05/09/19 1233 05/09/19 2057 05/10/19 0557 05/10/19 0829  BP: 124/76 122/66 137/78 115/66  Pulse: 80 87 77 85  Resp: 20 (!) 21 (!) 21 14  Temp: 98 F (36.7 C) 98.1 F (36.7 C) 97.9 F (36.6 C) 97.6 F (36.4 C)  TempSrc: Oral Oral Oral Oral  SpO2: 95% 99% 95% 99%  Weight:  Height:        General: Pt is alert, awake, not in acute distress Cardiovascular: RRR, S1/S2 present Respiratory: No respiratory distress, no wheezing, no rhonchi Abdominal: Soft, NT, ND Extremities: no edema, no cyanosis    The results of significant diagnostics from this hospitalization (including imaging, microbiology, ancillary and laboratory) are listed below for reference.      Microbiology: Recent Results (from the past 240 hour(s))  SARS CORONAVIRUS 2 (TAT 6-24 HRS) Nasopharyngeal Nasopharyngeal Swab     Status: None   Collection Time: 05/08/19  3:10 AM   Specimen: Nasopharyngeal Swab  Result Value Ref Range Status   SARS Coronavirus 2 NEGATIVE NEGATIVE Final    Comment: (NOTE) SARS-CoV-2 target nucleic acids are NOT DETECTED. The SARS-CoV-2 RNA is generally detectable in upper and lower respiratory specimens during the acute phase of infection. Negative results do not preclude SARS-CoV-2 infection, do not rule out co-infections with other pathogens, and should not be used as the sole basis for treatment or other patient management decisions. Negative results must be combined with clinical observations, patient history, and epidemiological information. The expected result is Negative. Fact Sheet for Patients: SugarRoll.be Fact Sheet for Healthcare Providers: https://www.woods-mathews.com/ This test is not yet approved or cleared by the [place] FDA and  has been authorized for detection and/or diagnosis of SARS-CoV-2 by FDA under an Emergency Use Authorization (EUA). This EUA will remain  in effect (meaning this test can be used) for the duration of the COVID-19 declaration under Section 56 4(b)(1) of the Act, 21 U.S.C. section 360bbb-3(b)(1), unless the authorization is terminated or revoked sooner. Performed at [facility], [place] [address], [place], [place] [address]   Respiratory Panel by PCR     Status: None   Collection Time: 05/08/19  4:23 AM   Specimen: Nasopharyngeal Swab; Respiratory  Result Value Ref Range Status   Adenovirus NOT DETECTED NOT DETECTED Final   Coronavirus 229E NOT DETECTED NOT DETECTED Final    Comment: (NOTE) The Coronavirus on the Respiratory Panel, DOES NOT test for the novel  Coronavirus (2019 nCoV)    Coronavirus HKU1 NOT DETECTED NOT DETECTED Final    Coronavirus NL63 NOT DETECTED NOT DETECTED Final   Coronavirus OC43 NOT DETECTED NOT DETECTED Final   Metapneumovirus NOT DETECTED NOT DETECTED Final   Rhinovirus / Enterovirus NOT DETECTED NOT DETECTED Final   Influenza A NOT DETECTED NOT DETECTED Final   Influenza B NOT DETECTED NOT DETECTED Final   Parainfluenza Virus 1 NOT DETECTED NOT DETECTED Final   Parainfluenza Virus 2 NOT DETECTED NOT DETECTED Final   Parainfluenza Virus 3 NOT DETECTED NOT DETECTED Final   Parainfluenza Virus 4 NOT DETECTED NOT DETECTED Final   Respiratory Syncytial Virus NOT DETECTED NOT DETECTED Final   Bordetella pertussis NOT DETECTED NOT DETECTED Final   Chlamydophila pneumoniae NOT DETECTED NOT DETECTED Final   Mycoplasma pneumoniae NOT DETECTED NOT DETECTED Final    Comment: Performed at [facility], [place]. [address], [place], [place] [address]     Labs: BNP (last 3 results) Recent Labs    05/07/19 2133  BNP 76.5   Basic Metabolic Panel: Recent Labs  Lab 05/07/19 2133 05/08/19 0344 05/09/19 0320 05/10/19 0413  NA 138 137 139 141  K 3.8 3.9 3.5 3.6  CL 105 106 111 105  CO2 21* 22 23 23   GLUCOSE 117* 127* 98 81  BUN 7 <5* <5* <5*  CREATININE 0.63 0.48 0.43* 0.48  CALCIUM 9.4  8.6* 8.3* 8.9   Liver Function Tests: Recent Labs  Lab 05/07/19 2133  AST 26  ALT 31  ALKPHOS 82  BILITOT 0.4  PROT 7.9  ALBUMIN 3.2*   No results for input(s): LIPASE, AMYLASE in the last 168 hours. No results for input(s): AMMONIA in the last 168 hours. CBC: Recent Labs  Lab 05/07/19 2133 05/08/19 0344 05/09/19 0320 05/10/19 0413  WBC 16.7* 13.5* 9.7 10.0  NEUTROABS 12.3*  --   --   --   HGB 11.9* 10.5* 9.8* 10.6*  HCT 38.7 35.2* 31.5* 34.6*  MCV 81.8 84.4 80.8 81.2  PLT 575* 447* 429* 503*   Cardiac Enzymes: No results for input(s): CKTOTAL, CKMB, CKMBINDEX, TROPONINI in the last 168 hours. BNP: Invalid input(s): POCBNP CBG: No results for input(s): GLUCAP in the last 168  hours. D-Dimer No results for input(s): DDIMER in the last 72 hours. Hgb A1c No results for input(s): HGBA1C in the last 72 hours. Lipid Profile No results for input(s): CHOL, HDL, LDLCALC, TRIG, CHOLHDL, LDLDIRECT in the last 72 hours. Thyroid function studies Recent Labs    05/07/19 2319 05/08/19 0316  TSH <0.010*  --   T3FREE  --  4.1   Anemia work up No results for input(s): VITAMINB12, FOLATE, FERRITIN, TIBC, IRON, RETICCTPCT in the last 72 hours. Urinalysis    Component Value Date/Time   COLORURINE YELLOW 05/08/2019 0125   APPEARANCEUR HAZY (A) 05/08/2019 0125   LABSPEC 1.036 (H) 05/08/2019 0125   PHURINE 6.0 05/08/2019 0125   GLUCOSEU NEGATIVE 05/08/2019 0125   HGBUR LARGE (A) 05/08/2019 0125   BILIRUBINUR NEGATIVE 05/08/2019 0125   KETONESUR 20 (A) 05/08/2019 0125   PROTEINUR NEGATIVE 05/08/2019 0125   NITRITE NEGATIVE 05/08/2019 0125   LEUKOCYTESUR NEGATIVE 05/08/2019 0125   Sepsis Labs Invalid input(s): PROCALCITONIN,  WBC,  LACTICIDVEN Microbiology Recent Results (from the past 240 hour(s))  SARS CORONAVIRUS 2 (TAT 6-24 HRS) Nasopharyngeal Nasopharyngeal Swab     Status: None   Collection Time: 05/08/19  3:10 AM   Specimen: Nasopharyngeal Swab  Result Value Ref Range Status   SARS Coronavirus 2 NEGATIVE NEGATIVE Final    Comment: (NOTE) SARS-CoV-2 target nucleic acids are NOT DETECTED. The SARS-CoV-2 RNA is generally detectable in upper and lower respiratory specimens during the acute phase of infection. Negative results do not preclude SARS-CoV-2 infection, do not rule out co-infections with other pathogens, and should not be used as the sole basis for treatment or other patient management decisions. Negative results must be combined with clinical observations, patient history, and epidemiological information. The expected result is Negative. Fact Sheet for Patients: SugarRoll.be Fact Sheet for Healthcare  Providers: https://www.woods-mathews.com/ This test is not yet approved or cleared by the [place] FDA and  has been authorized for detection and/or diagnosis of SARS-CoV-2 by FDA under an Emergency Use Authorization (EUA). This EUA will remain  in effect (meaning this test can be used) for the duration of the COVID-19 declaration under Section 56 4(b)(1) of the Act, 21 U.S.C. section 360bbb-3(b)(1), unless the authorization is terminated or revoked sooner. Performed at [facility], [place] [address], [place], [place] [address]   Respiratory Panel by PCR     Status: None   Collection Time: 05/08/19  4:23 AM   Specimen: Nasopharyngeal Swab; Respiratory  Result Value Ref Range Status   Adenovirus NOT DETECTED NOT DETECTED Final   Coronavirus 229E NOT DETECTED NOT DETECTED Final    Comment: (NOTE) The Coronavirus on the Respiratory  Panel, DOES NOT test for the novel  Coronavirus (2019 nCoV)    Coronavirus HKU1 NOT DETECTED NOT DETECTED Final   Coronavirus NL63 NOT DETECTED NOT DETECTED Final   Coronavirus OC43 NOT DETECTED NOT DETECTED Final   Metapneumovirus NOT DETECTED NOT DETECTED Final   Rhinovirus / Enterovirus NOT DETECTED NOT DETECTED Final   Influenza A NOT DETECTED NOT DETECTED Final   Influenza B NOT DETECTED NOT DETECTED Final   Parainfluenza Virus 1 NOT DETECTED NOT DETECTED Final   Parainfluenza Virus 2 NOT DETECTED NOT DETECTED Final   Parainfluenza Virus 3 NOT DETECTED NOT DETECTED Final   Parainfluenza Virus 4 NOT DETECTED NOT DETECTED Final   Respiratory Syncytial Virus NOT DETECTED NOT DETECTED Final   Bordetella pertussis NOT DETECTED NOT DETECTED Final   Chlamydophila pneumoniae NOT DETECTED NOT DETECTED Final   Mycoplasma pneumoniae NOT DETECTED NOT DETECTED Final    Comment: Performed at [facility], [place] [address], [place], [place] [address]     Time coordinating discharge: Over 33 minutes  SIGNED:   Blain Pais,  MD  [facility] 05/10/2019, 3:25 PM

## 2019-05-11 NOTE — Telephone Encounter (Signed)
Per Dr. Raliegh Scarlet, she spoke with [facility] regarding issues.  Charyl Bigger, CMA

## 2019-05-19 ENCOUNTER — Other Ambulatory Visit: Payer: Self-pay

## 2019-05-19 ENCOUNTER — Encounter: Payer: Self-pay | Admitting: Family Medicine

## 2019-05-19 ENCOUNTER — Ambulatory Visit (INDEPENDENT_AMBULATORY_CARE_PROVIDER_SITE_OTHER): Payer: Medicaid Other | Admitting: Family Medicine

## 2019-05-19 VITALS — BP 133/84 | HR 93 | Temp 98.2°F | Resp 12 | Ht 70.0 in | Wt 197.4 lb

## 2019-05-19 DIAGNOSIS — E0590 Thyrotoxicosis, unspecified without thyrotoxic crisis or storm: Secondary | ICD-10-CM

## 2019-05-19 DIAGNOSIS — J189 Pneumonia, unspecified organism: Secondary | ICD-10-CM | POA: Diagnosis not present

## 2019-05-19 DIAGNOSIS — D649 Anemia, unspecified: Secondary | ICD-10-CM

## 2019-05-19 DIAGNOSIS — Z09 Encounter for follow-up examination after completed treatment for conditions other than malignant neoplasm: Secondary | ICD-10-CM

## 2019-05-19 DIAGNOSIS — E0591 Thyrotoxicosis, unspecified with thyrotoxic crisis or storm: Secondary | ICD-10-CM

## 2019-05-19 DIAGNOSIS — F411 Generalized anxiety disorder: Secondary | ICD-10-CM

## 2019-05-19 DIAGNOSIS — F341 Dysthymic disorder: Secondary | ICD-10-CM

## 2019-05-19 DIAGNOSIS — R000 Tachycardia, unspecified: Secondary | ICD-10-CM | POA: Diagnosis not present

## 2019-05-19 DIAGNOSIS — R0602 Shortness of breath: Secondary | ICD-10-CM

## 2019-05-19 NOTE — Patient Instructions (Addendum)
Engage in 15 minutes of meditation twice daily using the Calm or Headspace app.  Exercise 20-30 minutes per day, walking.     Hyperthyroidism  Hyperthyroidism is when the thyroid gland is too active (overactive). The thyroid gland is a small gland located in the lower front part of the neck, just in front of the windpipe (trachea). This gland makes hormones that help control how the body uses food for energy (metabolism) as well as how the heart and brain function. These hormones also play a role in keeping your bones strong. When the thyroid is overactive, it produces too much of a hormone called thyroxine. What are the causes? This condition may be caused by:  Graves' disease. This is a disorder in which the body's disease-fighting system (immune system) attacks the thyroid gland. This is the most common cause.  Inflammation of the thyroid gland.  A tumor in the thyroid gland.  Use of certain medicines, including: ? Prescription thyroid hormone replacement. ? Herbal supplements that mimic thyroid hormones. ? Amiodarone therapy.  Solid or fluid-filled lumps within your thyroid gland (thyroid nodules).  Taking in a large amount of iodine from foods or medicines. What increases the risk? You are more likely to develop this condition if:  You are female.  You have a family history of thyroid conditions.  You smoke tobacco.  You use a medicine called lithium.  You take medicines that affect the immune system (immunosuppressants). What are the signs or symptoms? Symptoms of this condition include:  Nervousness.  Inability to tolerate heat.  Unexplained weight loss.  Diarrhea.  Change in the texture of hair or skin.  Heart skipping beats or making extra beats.  Rapid heart rate.  Loss of menstruation.  Shaky hands.  Fatigue.  Restlessness.  Sleep problems.  Enlarged thyroid gland or a lump in the thyroid (nodule). You may also have symptoms of Graves'  disease, which may include:  Protruding eyes.  Dry eyes.  Red or swollen eyes.  Problems with vision. How is this diagnosed? This condition may be diagnosed based on:  Your symptoms and medical history.  A physical exam.  Blood tests.  Thyroid ultrasound. This test involves using sound waves to produce images of the thyroid gland.  A thyroid scan. A radioactive substance is injected into a vein, and images show how much iodine is present in the thyroid.  Radioactive iodine uptake test (RAIU). A small amount of radioactive iodine is given by mouth to see how much iodine the thyroid absorbs after a certain amount of time. How is this treated? Treatment depends on the cause and severity of the condition. Treatment may include:  Medicines to reduce the amount of thyroid hormone your body makes.  Radioactive iodine treatment (radioiodine therapy). This involves swallowing a small dose of radioactive iodine, in capsule or liquid form, to kill thyroid cells.  Surgery to remove part or all of your thyroid gland. You may need to take thyroid hormone replacement medicine for the rest of your life after thyroid surgery.  Medicines to help manage your symptoms. Follow these instructions at home:   Take over-the-counter and prescription medicines only as told by your health care provider.  Do not use any products that contain nicotine or tobacco, such as cigarettes and e-cigarettes. If you need help quitting, ask your health care provider.  Follow any instructions from your health care provider about diet. You may be instructed to limit foods that contain iodine.  Keep all follow-up visits as  told by your health care provider. This is important. ? You will need to have blood tests regularly so that your health care provider can monitor your condition. Contact a health care provider if:  Your symptoms do not get better with treatment.  You have a fever.  You are taking thyroid  hormone replacement medicine and you: ? Have symptoms of depression. ? Feel like you are tired all the time. ? Gain weight. Get help right away if:  You have chest pain.  You have decreased alertness or a change in your awareness.  You have abdominal pain.  You feel dizzy.  You have a rapid heartbeat.  You have an irregular heartbeat.  You have difficulty breathing. Summary  The thyroid gland is a small gland located in the lower front part of the neck, just in front of the windpipe (trachea).  Hyperthyroidism is when the thyroid gland is too active (overactive) and produces too much of a hormone called thyroxine.  The most common cause is Graves' disease, a disorder in which your immune system attacks the thyroid gland.  Hyperthyroidism can cause various symptoms, such as unexplained weight loss, nervousness, inability to tolerate heat, or changes in your heartbeat.  Treatment may include medicine to reduce the amount of thyroid hormone your body makes, radioiodine therapy, surgery, or medicines to manage symptoms. This information is not intended to replace advice given to you by your health care provider. Make sure you discuss any questions you have with your health care provider. Document Revised: 03/08/2017 Document Reviewed: 03/06/2017 Elsevier Patient Education  [place].       Generalized Anxiety Disorder, Adult  Generalized anxiety disorder (GAD) is a mental health disorder. People with this condition constantly worry about everyday events. Unlike normal anxiety, worry related to GAD is not triggered by a specific event. These worries also do not fade or get better with time. GAD interferes with life functions, including relationships, work, and school. GAD can vary from mild to severe. People with severe GAD can have intense waves of anxiety with physical symptoms (panic attacks). What are the causes? The exact cause of GAD is not known. What increases  the risk? This condition is more likely to develop in:  Women.  People who have a family history of anxiety disorders.  People who are very shy.  People who experience very stressful life events, such as the death of a loved one.  People who have a very stressful family environment.  What are the signs or symptoms? People with GAD often worry excessively about many things in their lives, such as their health and family. They may also be overly concerned about:  Doing well at work.  Being on time.  Natural disasters.  Friendships.  Physical symptoms of GAD include:  Fatigue.  Muscle tension or having muscle twitches.  Trembling or feeling shaky.  Being easily startled.  Feeling like your heart is pounding or racing.  Feeling out of breath or like you cannot take a deep breath.  Having trouble falling asleep or staying asleep.  Sweating.  Nausea, diarrhea, or irritable bowel syndrome (IBS).  Headaches.  Trouble concentrating or remembering facts.  Restlessness.  Irritability.  How is this diagnosed? Your health care provider can diagnose GAD based on your symptoms and medical history. You will also have a physical exam. The health care provider will ask specific questions about your symptoms, including how severe they are, when they started, and if they come and go.  Your health care provider may ask you about your use of alcohol or drugs, including prescription medicines. Your health care provider may refer you to a mental health specialist for further evaluation. Your health care provider will do a thorough examination and may perform additional tests to rule out other possible causes of your symptoms. To be diagnosed with GAD, a person must have anxiety that:  Is out of his or her control.  Affects several different aspects of his or her life, such as work and relationships.  Causes distress that makes him or her unable to take part in normal  activities.  Includes at least three physical symptoms of GAD, such as restlessness, fatigue, trouble concentrating, irritability, muscle tension, or sleep problems.  Before your health care provider can confirm a diagnosis of GAD, these symptoms must be present more days than they are not, and they must last for six months or longer. How is this treated? The following therapies are usually used to treat GAD:  Medicine. Antidepressant medicine is usually prescribed for long-term daily control. Antianxiety medicines may be added in severe cases, especially when panic attacks occur.  Talk therapy (psychotherapy). Certain types of talk therapy can be helpful in treating GAD by providing support, education, and guidance. Options include: ? Cognitive behavioral therapy (CBT). People learn coping skills and techniques to ease their anxiety. They learn to identify unrealistic or negative thoughts and behaviors and to replace them with positive ones. ? Acceptance and commitment therapy (ACT). This treatment teaches people how to be mindful as a way to cope with unwanted thoughts and feelings. ? Biofeedback. This process trains you to manage your body's response (physiological response) through breathing techniques and relaxation methods. You will work with a therapist while machines are used to monitor your physical symptoms.  Stress management techniques. These include yoga, meditation, and exercise.  A mental health specialist can help determine which treatment is best for you. Some people see improvement with one type of therapy. However, other people require a combination of therapies. Follow these instructions at home:  Take over-the-counter and prescription medicines only as told by your health care provider.  Try to maintain a normal routine.  Try to anticipate stressful situations and allow extra time to manage them.  Practice any stress management or self-calming techniques as taught by  your health care provider.  Do not punish yourself for setbacks or for not making progress.  Try to recognize your accomplishments, even if they are small.  Keep all follow-up visits as told by your health care provider. This is important. Contact a health care provider if:  Your symptoms do not get better.  Your symptoms get worse.  You have signs of depression, such as: ? A persistently sad, cranky, or irritable mood. ? Loss of enjoyment in activities that used to bring you joy. ? Change in weight or eating. ? Changes in sleeping habits. ? Avoiding friends or family members. ? Loss of energy for normal tasks. ? Feelings of guilt or worthlessness. Get help right away if:  You have serious thoughts about hurting yourself or others. If you ever feel like you may hurt yourself or others, or have thoughts about taking your own life, get help right away. You can go to your nearest emergency department or call:  Your local emergency services (911 in the [place]  A suicide crisis helpline, such as the [facility] at (562)670-2027. This is open 24 hours a day.  Summary  Generalized  anxiety disorder (GAD) is a mental health disorder that involves worry that is not triggered by a specific event.  People with GAD often worry excessively about many things in their lives, such as their health and family.  GAD may cause physical symptoms such as restlessness, trouble concentrating, sleep problems, frequent sweating, nausea, diarrhea, headaches, and trembling or muscle twitching.  A mental health specialist can help determine which treatment is best for you. Some people see improvement with one type of therapy. However, other people require a combination of therapies. This information is not intended to replace advice given to you by your health care provider. Make sure you discuss any questions you have with your health care provider. Document Released: 07/21/2012  Document Revised: 02/14/2016 Document Reviewed: 02/14/2016 Elsevier Interactive Patient Education  2018 [place]  After being diagnosed with an anxiety disorder, you may be relieved to know why you have felt or behaved a certain way. It is natural to also feel overwhelmed about the treatment ahead and what it will mean for your life. With care and support, you can manage this condition and recover from it. How to cope with anxiety Dealing with stress Stress is your body's reaction to life changes and events, both good and bad. Stress can last just a few hours or it can be ongoing. Stress can play a major role in anxiety, so it is important to learn both how to cope with stress and how to think about it differently. Talk with your health care provider or a counselor to learn more about stress reduction. He or she may suggest some stress reduction techniques, such as:  Music therapy. This can include creating or listening to music that you enjoy and that inspires you.  Mindfulness-based meditation. This involves being aware of your normal breaths, rather than trying to control your breathing. It can be done while sitting or walking.  Centering prayer. This is a kind of meditation that involves focusing on a word, phrase, or sacred image that is meaningful to you and that brings you peace.  Deep breathing. To do this, expand your stomach and inhale slowly through your nose. Hold your breath for 3-5 seconds. Then exhale slowly, allowing your stomach muscles to relax.  Self-talk. This is a skill where you identify thought patterns that lead to anxiety reactions and correct those thoughts.  Muscle relaxation. This involves tensing muscles then relaxing them.  Choose a stress reduction technique that fits your lifestyle and personality. Stress reduction techniques take time and practice. Set aside 5-15 minutes a day to do them. Therapists can offer training in these  techniques. The training may be covered by some insurance plans. Other things you can do to manage stress include:  Keeping a stress diary. This can help you learn what triggers your stress and ways to control your response.  Thinking about how you respond to certain situations. You may not be able to control everything, but you can control your reaction.  Making time for activities that help you relax, and not feeling guilty about spending your time in this way.  Therapy combined with coping and stress-reduction skills provides the best chance for successful treatment. Medicines Medicines can help ease symptoms. Medicines for anxiety include:  Anti-anxiety drugs.  Antidepressants.  Beta-blockers.  Medicines may be used as the main treatment for anxiety disorder, along with therapy, or if other treatments are not working. Medicines should be prescribed by a health  care provider. Relationships Relationships can play a big part in helping you recover. Try to spend more time connecting with trusted friends and family members. Consider going to couples counseling, taking family education classes, or going to family therapy. Therapy can help you and others better understand the condition. How to recognize changes in your condition Everyone has a different response to treatment for anxiety. Recovery from anxiety happens when symptoms decrease and stop interfering with your daily activities at home or work. This may mean that you will start to:  Have better concentration and focus.  Sleep better.  Be less irritable.  Have more energy.  Have improved memory.  It is important to recognize when your condition is getting worse. Contact your health care provider if your symptoms interfere with home or work and you do not feel like your condition is improving. Where to find help and support: You can get help and support from these sources:  Self-help groups.  Online and [employer].  A trusted spiritual leader.  Couples counseling.  Family education classes.  Family therapy.  Follow these instructions at home:  Eat a healthy diet that includes plenty of vegetables, fruits, whole grains, low-fat dairy products, and lean protein. Do not eat a lot of foods that are high in solid fats, added sugars, or salt.  Exercise. Most adults should do the following: ? Exercise for at least 150 minutes each week. The exercise should increase your heart rate and make you sweat (moderate-intensity exercise). ? Strengthening exercises at least twice a week.  Cut down on caffeine, tobacco, alcohol, and other potentially harmful substances.  Get the right amount and quality of sleep. Most adults need 7-9 hours of sleep each night.  Make choices that simplify your life.  Take over-the-counter and prescription medicines only as told by your health care provider.  Avoid caffeine, alcohol, and certain over-the-counter cold medicines. These may make you feel worse. Ask your pharmacist which medicines to avoid.  Keep all follow-up visits as told by your health care provider. This is important. Questions to ask your health care provider  Would I benefit from therapy?  How often should I follow up with a health care provider?  How long do I need to take medicine?  Are there any long-term side effects of my medicine?  Are there any alternatives to taking medicine? Contact a health care provider if:  You have a hard time staying focused or finishing daily tasks.  You spend many hours a day feeling worried about everyday life.  You become exhausted by worry.  You start to have headaches, feel tense, or have nausea.  You urinate more than normal.  You have diarrhea. Get help right away if:  You have a racing heart and shortness of breath.  You have thoughts of hurting yourself or others. If you ever feel like you may hurt yourself or others, or have  thoughts about taking your own life, get help right away. You can go to your nearest emergency department or call:  Your local emergency services (911 in the [place]  A suicide crisis helpline, such as the [facility] at 343-369-8932. This is open 24-hours a day.  Summary  Taking steps to deal with stress can help calm you.  Medicines cannot cure anxiety disorders, but they can help ease symptoms.  Family, friends, and partners can play a big part in helping you recover from an anxiety disorder. This information is not intended to replace  advice given to you by your health care provider. Make sure you discuss any questions you have with your health care provider. Document Released: 03/20/2016 Document Revised: 03/20/2016 Document Reviewed: 03/20/2016 Elsevier Interactive Patient Education  2018 [place].          Generalized Anxiety Disorder, Adult  Generalized anxiety disorder (GAD) is a mental health disorder. People with this condition constantly worry about everyday events. Unlike normal anxiety, worry related to GAD is not triggered by a specific event. These worries also do not fade or get better with time. GAD interferes with life functions, including relationships, work, and school. GAD can vary from mild to severe. People with severe GAD can have intense waves of anxiety with physical symptoms (panic attacks). What are the causes? The exact cause of GAD is not known. What increases the risk? This condition is more likely to develop in:  Women.  People who have a family history of anxiety disorders.  People who are very shy.  People who experience very stressful life events, such as the death of a loved one.  People who have a very stressful family environment.  What are the signs or symptoms? People with GAD often worry excessively about many things in their lives, such as their health and family. They may also be overly concerned  about:  Doing well at work.  Being on time.  Natural disasters.  Friendships.  Physical symptoms of GAD include:  Fatigue.  Muscle tension or having muscle twitches.  Trembling or feeling shaky.  Being easily startled.  Feeling like your heart is pounding or racing.  Feeling out of breath or like you cannot take a deep breath.  Having trouble falling asleep or staying asleep.  Sweating.  Nausea, diarrhea, or irritable bowel syndrome (IBS).  Headaches.  Trouble concentrating or remembering facts.  Restlessness.  Irritability.  How is this diagnosed? Your health care provider can diagnose GAD based on your symptoms and medical history. You will also have a physical exam. The health care provider will ask specific questions about your symptoms, including how severe they are, when they started, and if they come and go. Your health care provider may ask you about your use of alcohol or drugs, including prescription medicines. Your health care provider may refer you to a mental health specialist for further evaluation. Your health care provider will do a thorough examination and may perform additional tests to rule out other possible causes of your symptoms. To be diagnosed with GAD, a person must have anxiety that:  Is out of his or her control.  Affects several different aspects of his or her life, such as work and relationships.  Causes distress that makes him or her unable to take part in normal activities.  Includes at least three physical symptoms of GAD, such as restlessness, fatigue, trouble concentrating, irritability, muscle tension, or sleep problems.  Before your health care provider can confirm a diagnosis of GAD, these symptoms must be present more days than they are not, and they must last for six months or longer. How is this treated? The following therapies are usually used to treat GAD:  Medicine. Antidepressant medicine is usually prescribed for  long-term daily control. Antianxiety medicines may be added in severe cases, especially when panic attacks occur.  Talk therapy (psychotherapy). Certain types of talk therapy can be helpful in treating GAD by providing support, education, and guidance. Options include: ? Cognitive behavioral therapy (CBT). People learn coping skills and techniques to ease  their anxiety. They learn to identify unrealistic or negative thoughts and behaviors and to replace them with positive ones. ? Acceptance and commitment therapy (ACT). This treatment teaches people how to be mindful as a way to cope with unwanted thoughts and feelings. ? Biofeedback. This process trains you to manage your body's response (physiological response) through breathing techniques and relaxation methods. You will work with a therapist while machines are used to monitor your physical symptoms.  Stress management techniques. These include yoga, meditation, and exercise.  A mental health specialist can help determine which treatment is best for you. Some people see improvement with one type of therapy. However, other people require a combination of therapies. Follow these instructions at home:  Take over-the-counter and prescription medicines only as told by your health care provider.  Try to maintain a normal routine.  Try to anticipate stressful situations and allow extra time to manage them.  Practice any stress management or self-calming techniques as taught by your health care provider.  Do not punish yourself for setbacks or for not making progress.  Try to recognize your accomplishments, even if they are small.  Keep all follow-up visits as told by your health care provider. This is important. Contact a health care provider if:  Your symptoms do not get better.  Your symptoms get worse.  You have signs of depression, such as: ? A persistently sad, cranky, or irritable mood. ? Loss of enjoyment in activities that used  to bring you joy. ? Change in weight or eating. ? Changes in sleeping habits. ? Avoiding friends or family members. ? Loss of energy for normal tasks. ? Feelings of guilt or worthlessness. Get help right away if:  You have serious thoughts about hurting yourself or others. If you ever feel like you may hurt yourself or others, or have thoughts about taking your own life, get help right away. You can go to your nearest emergency department or call:  Your local emergency services (911 in the [place]  A suicide crisis helpline, such as the [facility] at 416-011-6117. This is open 24 hours a day.  Summary  Generalized anxiety disorder (GAD) is a mental health disorder that involves worry that is not triggered by a specific event.  People with GAD often worry excessively about many things in their lives, such as their health and family.  GAD may cause physical symptoms such as restlessness, trouble concentrating, sleep problems, frequent sweating, nausea, diarrhea, headaches, and trembling or muscle twitching.  A mental health specialist can help determine which treatment is best for you. Some people see improvement with one type of therapy. However, other people require a combination of therapies. This information is not intended to replace advice given to you by your health care provider. Make sure you discuss any questions you have with your health care provider. Document Released: 07/21/2012 Document Revised: 02/14/2016 Document Reviewed: 02/14/2016 Elsevier Interactive Patient Education  2018 [place]  After being diagnosed with an anxiety disorder, you may be relieved to know why you have felt or behaved a certain way. It is natural to also feel overwhelmed about the treatment ahead and what it will mean for your life. With care and support, you can manage this condition and recover from it. How to cope with  anxiety Dealing with stress Stress is your body's reaction to life changes and events, both good and bad. Stress can last just a few hours  or it can be ongoing. Stress can play a major role in anxiety, so it is important to learn both how to cope with stress and how to think about it differently. Talk with your health care provider or a counselor to learn more about stress reduction. He or she may suggest some stress reduction techniques, such as:  Music therapy. This can include creating or listening to music that you enjoy and that inspires you.  Mindfulness-based meditation. This involves being aware of your normal breaths, rather than trying to control your breathing. It can be done while sitting or walking.  Centering prayer. This is a kind of meditation that involves focusing on a word, phrase, or sacred image that is meaningful to you and that brings you peace.  Deep breathing. To do this, expand your stomach and inhale slowly through your nose. Hold your breath for 3-5 seconds. Then exhale slowly, allowing your stomach muscles to relax.  Self-talk. This is a skill where you identify thought patterns that lead to anxiety reactions and correct those thoughts.  Muscle relaxation. This involves tensing muscles then relaxing them.  Choose a stress reduction technique that fits your lifestyle and personality. Stress reduction techniques take time and practice. Set aside 5-15 minutes a day to do them. Therapists can offer training in these techniques. The training may be covered by some insurance plans. Other things you can do to manage stress include:  Keeping a stress diary. This can help you learn what triggers your stress and ways to control your response.  Thinking about how you respond to certain situations. You may not be able to control everything, but you can control your reaction.  Making time for activities that help you relax, and not feeling guilty about spending your time in  this way.  Therapy combined with coping and stress-reduction skills provides the best chance for successful treatment. Medicines Medicines can help ease symptoms. Medicines for anxiety include:  Anti-anxiety drugs.  Antidepressants.  Beta-blockers.  Medicines may be used as the main treatment for anxiety disorder, along with therapy, or if other treatments are not working. Medicines should be prescribed by a health care provider. Relationships Relationships can play a big part in helping you recover. Try to spend more time connecting with trusted friends and family members. Consider going to couples counseling, taking family education classes, or going to family therapy. Therapy can help you and others better understand the condition. How to recognize changes in your condition Everyone has a different response to treatment for anxiety. Recovery from anxiety happens when symptoms decrease and stop interfering with your daily activities at home or work. This may mean that you will start to:  Have better concentration and focus.  Sleep better.  Be less irritable.  Have more energy.  Have improved memory.  It is important to recognize when your condition is getting worse. Contact your health care provider if your symptoms interfere with home or work and you do not feel like your condition is improving. Where to find help and support: You can get help and support from these sources:  Self-help groups.  Online and [employer].  A trusted spiritual leader.  Couples counseling.  Family education classes.  Family therapy.  Follow these instructions at home:  Eat a healthy diet that includes plenty of vegetables, fruits, whole grains, low-fat dairy products, and lean protein. Do not eat a lot of foods that are high in solid fats, added sugars, or salt.  Exercise. Most adults  should do the following: ? Exercise for at least 150 minutes each week. The exercise should  increase your heart rate and make you sweat (moderate-intensity exercise). ? Strengthening exercises at least twice a week.  Cut down on caffeine, tobacco, alcohol, and other potentially harmful substances.  Get the right amount and quality of sleep. Most adults need 7-9 hours of sleep each night.  Make choices that simplify your life.  Take over-the-counter and prescription medicines only as told by your health care provider.  Avoid caffeine, alcohol, and certain over-the-counter cold medicines. These may make you feel worse. Ask your pharmacist which medicines to avoid.  Keep all follow-up visits as told by your health care provider. This is important. Questions to ask your health care provider  Would I benefit from therapy?  How often should I follow up with a health care provider?  How long do I need to take medicine?  Are there any long-term side effects of my medicine?  Are there any alternatives to taking medicine? Contact a health care provider if:  You have a hard time staying focused or finishing daily tasks.  You spend many hours a day feeling worried about everyday life.  You become exhausted by worry.  You start to have headaches, feel tense, or have nausea.  You urinate more than normal.  You have diarrhea. Get help right away if:  You have a racing heart and shortness of breath.  You have thoughts of hurting yourself or others. If you ever feel like you may hurt yourself or others, or have thoughts about taking your own life, get help right away. You can go to your nearest emergency department or call:  Your local emergency services (911 in the [place]  A suicide crisis helpline, such as the [place] at (220)154-9074. This is open 24-hours a day.  Summary  Taking steps to deal with stress can help calm you.  Medicines cannot cure anxiety disorders, but they can help ease symptoms.  Family, friends, and partners can play  a big part in helping you recover from an anxiety disorder. This information is not intended to replace advice given to you by your health care provider. Make sure you discuss any questions you have with your health care provider. Document Released: 03/20/2016 Document Revised: 03/20/2016 Document Reviewed: 03/20/2016 Elsevier Interactive Patient Education  [employer].

## 2019-05-19 NOTE — Progress Notes (Signed)
Impression and Recommendations:    1. Hospital discharge follow-up   2. Community acquired pneumonia, unspecified laterality   3. Thyrotoxicosis with thyrotoxic crisis, unspecified thyrotoxicosis type   4. Tachycardia   5. Shortness of breath   6. Hyperthyroidism   7. Dysthymia   8. GAD (generalized anxiety disorder)   9. Anemia, unspecified type     Hospital Follow-Up: Community Acquired Pneumonia-multifocal,  thyroid storm --> tachycardia, tachypnea and shortness of breath - Regarding pt's recent hospitalization and/or ED visit: reviewed in great detail recent hospitalization notes, clinical lab tests, tests in the radiology section of CPT, tests in the medicine testing of CPT, and obtained history from family member.  For her multifocal CAP:   - She denies shortness of breath; she is continuing all of her antibiotics for her multifocal pneumonia. -Her energy levels are improving and she denies any fever chills; eating and drinking within normal limits  - Need for lab work today.  See orders.  Patient is not fasting.  - Will continue to monitor.   Tachycardia - Now Resolved - Managed on beta blocker since hospital visit. - Per patient, tolerating well without S-E.  - Patient knows to continue to monitor for S-E on medication.  - Will continue to monitor.   Hyperthyroidism, Thyrotoxicosis - Ambulatory referral to endocrinology placed today, since patient never did follow-up with [facility] endocrinology for a appointment.  See orders. - Continue treatment plan as established. - Very low TSH, elevated T4,  T3,thyroid uptakeunremarkable.   - Continue methimazole, BB.  - Patient reports feeling better, her HR is improving, down to 80-90s now.  - Specialist will modify patient's plan in future, and handle refills.  - Discussed critical importance of maintenance through specialist as advised.  - Will continue to monitor alongside specialist.   Anemia - Dad with  h/o Iron Deficiency - Need for re-check CBC, and iron studies if needed. -Suspicious of a acute phase reactant and chronic disease due to recent medical events - Will continue to monitor.   GAD, dysthymia: -Patient states her symptoms are currently stable. - STRONGLY encouraged patient to exercise regularly and take deep breaths to encourage her lungs to continue healing/recovering. - Per patient, wishes to wait until the future to address beginning medications or counseling.  - Strongly encouraged patient to establish with counselor/therapist if possible, patient declines today.  - In addition to prescription intervention and therapy/counseling, reviewed the "spokes of the wheel" of mood and health management.  Stressed the importance of ongoing prudent habits, including regular exercise, appropriate sleep hygiene, healthful dietary habits, and prayer/meditation to relax.  - Encouraged patient to engage in 30 minutes of exercise daily. - Encouraged patient to begin using Calm or Headspace app to meditate for 15 minutes, twice daily.  - Will continue to monitor.   Lifestyle & Preventative Health Maintenance - Advised patient to continue working toward exercising to improve overall mental, physical, and emotional health.    - Encouraged patient to engage in daily physical activity as tolerated, especially a formal exercise routine.  Recommended that the patient eventually strive for at least 150 minutes of moderate cardiovascular activity per week according to guidelines established by the [facility].   - Healthy dietary habits encouraged, including low-carb, and high amounts of lean protein in diet.   - Patient should also consume adequate amounts of water.  - Health counseling performed.  All questions answered.   Recommendations - Return near future for CPE and full  fasting lab work.   Orders Placed This Encounter  Procedures  . Vitamin B12  . Folate  . Iron and TIBC  . Ferritin    . CBC with Differential/Platelet  . Basic metabolic panel  . TSH  . T4, free  . Transferrin  . Ambulatory referral to Endocrinology    Gross side effects, risk and benefits, and alternatives of medications and treatment plan in general discussed with patient.  Patient is aware that all medications have potential side effects and we are unable to predict every side effect or drug-drug interaction that may occur.   Patient will call with any questions prior to using medication if they have concerns.    Expresses verbal understanding and consents to current therapy and treatment regimen.  No barriers to understanding were identified.  Red flag symptoms and signs discussed in detail.  Patient expressed understanding regarding what to do in case of emergency\urgent symptoms  Please see AVS handed out to patient at the end of our visit for further patient instructions/ counseling done pertaining to today's office visit.   Return for CPE near future- in 73mo or f/up sooner if issues.     Note:  This note was prepared with assistance of Dragon voice recognition software. Occasional wrong-word or sound-a-like substitutions may have occurred due to the inherent limitations of voice recognition software.   This document serves as a record of services personally performed by Mellody Dance, DO. It was created on her behalf by Toni Amend, a trained medical scribe. The creation of this record is based on the scribe's personal observations and the provider's statements to them.   This case required medical decision making of at least moderate complexity.   This case required medical decision making of at least moderate complexity. The above documentation from Toni Amend, medical scribe, has been reviewed by Marjory Sneddon,  D.O.      --------------------------------------------------------------------------------------------------------------------------------------------------------------------------------------------------------------------------------------------    Subjective:     Phillips Odor, am serving as scribe for Dr.Bhumi Godbey.  HPI: JANYIA GUION is a 34 y.o. female who presents to [facility] today for issues as discussed below.  [facility] Follow-Up; Community Acquired Pneumonia  Patient was recently seen by Urgent Care and found to have pneumonia.  Started beta blocker, methimazole for hyperthyroidism, and antibiotic for pneumonia.  Since the pneumonia, notes she still has a little bit of coughing, "but it's more productive and it's not persistent like it was."  "I actually feel like I'm a lot better than I was, so much better."  Confirms that her breathing is better.  - Tachycardia; Management on Beta Blocker Her heart palpitations and heart racing has "eased off tremendously."  Notes she has a heart palpitation now and then still, but she thinks this is due to stress.  Denies dizziness, lightheadedness, or new or different symptoms / S-E.  Notes the beta blocker does make her a little sleepy sometimes.  She takes this three times per day at a full 40 mg tablet.  - Hyperthyroidism She was told to follow up with [facility] endocrinology.  When she called, she was told they were booked until October and couldn't get the patient in.  Notes they tried to call [facility], had to leave a message on the machine, and haven't heard back yet.  - Anemia Notes her dad has diabetes and gets anemic time to time.  "He has to take shots."  - Anxiety, depression She has anxiety and  depression.  Has been trying to find a therapist, but notes they are expensive.  Has a good support system of friends and is working on meditation at home, etc.  Notes she does sleep, "but I  could be better."  She enjoys going for walks, at times with her sister.  - Female Health Notes she has not been comfortable with establishing with an OBGYN.     Wt Readings from Last 3 Encounters:  05/19/19 197 lb 6.4 oz (89.5 kg)  05/08/19 195 lb 9.6 oz (88.7 kg)  05/07/19 197 lb 3.2 oz (89.4 kg)   BP Readings from Last 3 Encounters:  05/19/19 133/84  05/10/19 115/66  05/07/19 139/85   Pulse Readings from Last 3 Encounters:  05/19/19 93  05/10/19 85  05/07/19 (!) 150   BMI Readings from Last 3 Encounters:  05/19/19 28.32 kg/m  05/08/19 28.07 kg/m  05/07/19 30.95 kg/m     Patient Care Team    Relationship Specialty Notifications Start End  Mellody Dance, DO PCP - General Family Medicine  05/07/19      Patient Active Problem List   Diagnosis Date Noted  . GAD (generalized anxiety disorder) 05/19/2019  . Community acquired pneumonia bilateral 05/08/2019  . 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 Depression 05/08/2019  . Hyperthyroidism 05/08/2019  . Shortness of breath   . Tachycardia     Past Medical history, Surgical history, Family history, Social history, Allergies and Medications have been entered into the medical record, reviewed and changed as needed.    Current Meds  Medication Sig  . guaiFENesin-dextromethorphan (ROBITUSSIN DM) 100-10 MG/5ML syrup Take 5 mLs by mouth every 4 (four) hours as needed for cough (PRN cough).  . methimazole (TAPAZOLE) 5 MG tablet Take 1 tablet (5 mg total) by mouth 3 (three) times daily.  . Multiple Vitamin (MULTIVITAMIN WITH MINERALS) TABS tablet Take 1 tablet by mouth daily.  Marland [employer] OVER THE COUNTER MEDICATION CBD gummies taking 2 daily  . propranolol (INDERAL) 40 MG tablet Take 1 tablet (40 mg total) by mouth 3 (three) times daily.    Allergies:  Allergies  Allergen Reactions  . Enfamil Reguline-Iron [Alimentum]     unknown     Review of Systems:  A fourteen system review of systems was performed and found to be positive  as per HPI.   Objective:   Blood pressure 133/84, pulse 93, temperature 98.2 F (36.8 C), temperature source Oral, resp. rate 12, height 5\' 10"  (1.778 m), weight 197 lb 6.4 oz (89.5 kg), last menstrual period 05/11/2019, SpO2 92 %. Body mass index is 28.32 kg/m. General:  Well Developed, well nourished, appropriate for stated age.  Neuro:  Alert and oriented,  extra-ocular muscles intact  HEENT:  Normocephalic, atraumatic, neck supple, no carotid bruits appreciated  Skin:  no gross rash, warm, pink. Cardiac:  RRR, S1 S2 Respiratory:   No rhonchi or wheeze, but bilateral rales at the bases, slightly greater on the left than the right.  Overall aeration very much improved from prior.  Not using accessory muscles, speaking in full sentences- unlabored. Vascular:  Ext warm, no cyanosis apprec.; cap RF less 2 sec. Psych:  No HI/SI, judgement and insight good, Euthymic mood. Full Affect.

## 2019-05-20 LAB — BASIC METABOLIC PANEL
BUN/Creatinine Ratio: 8 — ABNORMAL LOW (ref 9–23)
BUN: 5 mg/dL — ABNORMAL LOW (ref 6–20)
CO2: 20 mmol/L (ref 20–29)
Calcium: 9.2 mg/dL (ref 8.7–10.2)
Chloride: 104 mmol/L (ref 96–106)
Creatinine, Ser: 0.61 mg/dL (ref 0.57–1.00)
GFR calc Af Amer: 139 mL/min/{1.73_m2} (ref >59)
GFR calc non Af Amer: 120 mL/min/{1.73_m2} (ref >59)
Glucose: 89 mg/dL (ref 65–99)
Potassium: 4.5 mmol/L (ref 3.5–5.2)
Sodium: 139 mmol/L (ref 134–144)

## 2019-05-20 LAB — CBC WITH DIFFERENTIAL/PLATELET
Basophils Absolute: 0.1 10*3/uL (ref 0.0–0.2)
Basos: 1 %
EOS (ABSOLUTE): 0.2 10*3/uL (ref 0.0–0.4)
Eos: 2 %
Hematocrit: 38.1 % (ref 34.0–46.6)
Hemoglobin: 12.1 g/dL (ref 11.1–15.9)
Immature Grans (Abs): 0 10*3/uL (ref 0.0–0.1)
Immature Granulocytes: 0 %
Lymphocytes Absolute: 2.4 10*3/uL (ref 0.7–3.1)
Lymphs: 22 %
MCH: 25.3 pg — ABNORMAL LOW (ref 26.6–33.0)
MCHC: 31.8 g/dL (ref 31.5–35.7)
MCV: 80 fL (ref 79–97)
Monocytes Absolute: 1 10*3/uL — ABNORMAL HIGH (ref 0.1–0.9)
Monocytes: 10 %
Neutrophils Absolute: 7.1 10*3/uL — ABNORMAL HIGH (ref 1.4–7.0)
Neutrophils: 65 %
Platelets: 614 10*3/uL — ABNORMAL HIGH (ref 150–450)
RBC: 4.79 x10E6/uL (ref 3.77–5.28)
RDW: 15.5 % — ABNORMAL HIGH (ref 11.7–15.4)
WBC: 10.8 10*3/uL (ref 3.4–10.8)

## 2019-05-20 LAB — FERRITIN: Ferritin: 325 ng/mL — ABNORMAL HIGH (ref 15–150)

## 2019-05-20 LAB — IRON AND TIBC
Iron Saturation: 17 % (ref 15–55)
Iron: 37 ug/dL (ref 27–159)
Total Iron Binding Capacity: 222 ug/dL — ABNORMAL LOW (ref 250–450)
UIBC: 185 ug/dL (ref 131–425)

## 2019-05-20 LAB — VITAMIN B12: Vitamin B-12: 607 pg/mL (ref 232–1245)

## 2019-05-20 LAB — FOLATE: Folate: >20 ng/mL (ref >3.0)

## 2019-05-20 LAB — T4, FREE: Free T4: 1.13 ng/dL (ref 0.82–1.77)

## 2019-05-20 LAB — TRANSFERRIN: Transferrin: 187 mg/dL — ABNORMAL LOW (ref 192–364)

## 2019-05-20 LAB — TSH: TSH: <0.005 u[IU]/mL — ABNORMAL LOW (ref 0.450–4.500)

## 2019-05-25 NOTE — Progress Notes (Signed)
Patient is aware of the results.  Patient doesn't currently have an apt set up with Endo. Our referral coordinator is calling to see if he can expedite the referral. Because I do not know what Endo provider to send these labs to I am mailing a copy of them to the patient for her to take with her to the apt once scheduled. AS, CMA

## 2019-06-04 ENCOUNTER — Other Ambulatory Visit: Payer: Self-pay

## 2019-06-08 ENCOUNTER — Other Ambulatory Visit: Payer: Self-pay

## 2019-06-08 ENCOUNTER — Encounter: Payer: Self-pay | Admitting: Internal Medicine

## 2019-06-08 ENCOUNTER — Ambulatory Visit: Payer: Medicaid Other | Admitting: Internal Medicine

## 2019-06-08 VITALS — BP 118/72 | HR 73 | Temp 98.2°F | Ht 70.0 in | Wt 197.6 lb

## 2019-06-08 DIAGNOSIS — E0590 Thyrotoxicosis, unspecified without thyrotoxic crisis or storm: Secondary | ICD-10-CM | POA: Diagnosis not present

## 2019-06-08 MED ORDER — METHIMAZOLE 5 MG PO TABS: 5.0000 mg | ORAL_TABLET | Freq: Three times a day (TID) | ORAL | 1 refills | Status: DC

## 2019-06-08 MED ORDER — PROPRANOLOL HCL 40 MG PO TABS: 40.0000 mg | ORAL_TABLET | Freq: Three times a day (TID) | ORAL | 1 refills | Status: DC

## 2019-06-08 NOTE — Progress Notes (Signed)
Name: Sarah Chandler  MRN/ DOB: [id number], 1986/02/07    Age/ Sex: 34 y.o., female    PCP: Mellody Dance, DO   Reason for Endocrinology Evaluation: Hyperthyroidism     Date of Initial Endocrinology Evaluation: 06/08/2019     HPI: Sarah Chandler is a 34 y.o. female with unremarkable past medical history . The patient presented for initial endocrinology clinic visit on 06/08/2019 for consultative assistance with her Hyperthyroidism   Pt presented to the ED on 05/10/2019 with palpitations and fatigue. She was noted to have a suppressed TSH < 0.005 uIU/mL with elevated FT4 at 1.70 ng/dL .She was started on Methimazole at the time.   Today she denies any side effects to methimazole, has noted improvement in hair loss, her palpitations have resolved and weight has been stable. No abdominal pain or fever   She was diagnosed with PNA around the time of hyperthyoridism diagnosis     Methimazole 5 mg TID    No FH of thyroid disease    HISTORY:  Past Medical History:  Past Medical History:  Diagnosis Date  . Pneumonia 05/08/2019   Past Surgical History:  Past Surgical History:  Procedure Laterality Date  . NO PAST SURGERIES    . TOOTH EXTRACTION        Social History:  reports that she has never smoked. She has never used smokeless tobacco. She reports that she does not drink alcohol or use drugs.  Family History: family history includes Cancer in her maternal grandmother and paternal grandfather; Depression in her maternal uncle; Diabetes in her father and maternal uncle; Hyperlipidemia in her father and maternal grandmother; Hypertension in her father.   HOME MEDICATIONS: Allergies as of 06/08/2019      Reactions   Enfamil Reguline-iron [alimentum]    unknown      Medication List       Accurate as of June 08, 2019  3:05 PM. If you have any questions, ask your nurse or doctor.        guaiFENesin-dextromethorphan 100-10 MG/5ML syrup Commonly known as:  ROBITUSSIN DM Take 5 mLs by mouth every 4 (four) hours as needed for cough (PRN cough).   methimazole 5 MG tablet Commonly known as: TAPAZOLE Take 1 tablet (5 mg total) by mouth 3 (three) times daily.   multivitamin with minerals Tabs tablet Take 1 tablet by mouth daily.   OVER THE COUNTER MEDICATION CBD gummies taking 2 daily   propranolol 40 MG tablet Commonly known as: INDERAL Take 1 tablet (40 mg total) by mouth 3 (three) times daily.         REVIEW OF SYSTEMS: A comprehensive ROS was conducted with the patient and is negative except as per HPI    OBJECTIVE:  VS: BP 118/72 (BP Location: Right Arm, Patient Position: Sitting, Cuff Size: Large)   Pulse 73   Temp 98.2 F (36.8 C)   Ht 5\' 10"  (1.778 m)   Wt 197 lb 9.6 oz (89.6 kg)   LMP 05/11/2019   SpO2 97%   BMI 28.35 kg/m    Wt Readings from Last 3 Encounters:  06/08/19 197 lb 9.6 oz (89.6 kg)  05/19/19 197 lb 6.4 oz (89.5 kg)  05/08/19 195 lb 9.6 oz (88.7 kg)     EXAM: General: Pt appears well and is in NAD  Eyes: External eye exam normal with a stare on the right but no lid lag or exophthalmos.   Neck: General: Supple without adenopathy. Thyroid:  Thyroid size normal.  No goiter or nodules appreciated. No thyroid bruit.  Lungs: Clear with good BS bilat with no rales, rhonchi, or wheezes  Heart: Auscultation: RRR.  Abdomen: Normoactive bowel sounds, soft, nontender, without masses or organomegaly palpable  Extremities:  BL LE: No pretibial edema normal ROM and strength.  Skin: Hair: Texture and amount normal with gender appropriate distribution Skin Inspection: No rashe Skin Palpation: Skin temperature, texture, and thickness normal to palpation  Neuro: Cranial nerves: II - XII grossly intact  Motor: Normal strength throughout DTRs: 2+ and symmetric in UE without delay in relaxation phase  Mental Status: Judgment, insight: Intact Orientation: Oriented to time, place, and person Mood and affect: No  depression, anxiety, or agitation     DATA REVIEWED:   Results for KLOI, BRODMAN (MRN [id number]) as of 06/11/2019 09:04  Ref. Range 06/08/2019 14:52  TSH Latest Units: mIU/L 0.02 (L)  T4,Free(Direct) Latest Ref Range: 0.8 - 1.8 ng/dL 0.8    ASSESSMENT/PLAN/RECOMMENDATIONS:   1. Hyperthyroidism:  - We discussed the causes of overt hyperthyroidism, including Graves' disease, autonomously functioning thyroid adenomas and multinodular goiters versus thyroiditis. - We will check TRAb levels today -Patient is clinically euthyroid -No local neck symptoms -Tolerating methimazole without side effects    Medications : Methimazole 5 mg, 3 tabs daily Propranolol 40 mg 3 times daily    Follow-up in 3 months Signed electronically by: Mack Guise, MD  [facility] Endocrinology  [facility] [address], [place] [address] Phone: [phone] FAX: [phone]   CC: Mellody Dance, [place] [address] Phone: [phone] Fax: [phone]   Return to Endocrinology clinic as below: Future Appointments  Date Time Provider [place] [facility] [place]  07/16/2019  1:45 PM Mellody Dance, DO [facility] None  09/09/2019  2:00 PM Chidi Shirer, Melanie Crazier, MD [facility] None

## 2019-06-08 NOTE — Patient Instructions (Signed)
We recommend that you follow these hyperthyroidism instructions at home:  1) Take Methimazole 5 mg three times a day  If you develop severe sore throat with high fevers OR develop unexplained yellowing of your skin, eyes, under your tongue, severe abdominal pain with nausea or vomiting --> then please get evaluated immediately.  2) Propranolol 40 mg three  times a day

## 2019-06-11 ENCOUNTER — Telehealth: Payer: Self-pay | Admitting: Internal Medicine

## 2019-06-11 ENCOUNTER — Encounter: Payer: Self-pay | Admitting: Internal Medicine

## 2019-06-11 LAB — T4, FREE: Free T4: 0.8 ng/dL (ref 0.8–1.8)

## 2019-06-11 LAB — TEST AUTHORIZATION

## 2019-06-11 LAB — TRAB (TSH RECEPTOR BINDING ANTIBODY): TRAB: 5.19 IU/L — ABNORMAL HIGH (ref <=2.00)

## 2019-06-11 LAB — TSH: TSH: 0.02 mIU/L — ABNORMAL LOW

## 2019-06-11 NOTE — Telephone Encounter (Signed)
Please let her know her thyroid is stable. No change in the dose of methimazole at this time.     Thanks    Abby Nena Jordan, MD  [facility] Endocrinology  [facility] [address] [place] [place], [place] [address] Phone: [phone] FAX: [phone]

## 2019-06-11 NOTE — Telephone Encounter (Signed)
Attempted to call pt, pt does not have a mailbox set up on her phone so could not leave vm.

## 2019-06-30 ENCOUNTER — Telehealth: Payer: Self-pay | Admitting: Family Medicine

## 2019-06-30 NOTE — Telephone Encounter (Signed)
Pt states symptoms of Pneumonia have returned & she is out of her Rxs and request Dr. Raliegh Scarlet refill them for her.  --Forwarding request to med asst for review & approval.  --glh

## 2019-06-30 NOTE — Telephone Encounter (Signed)
Spoke with patient and she states she is still coughing a lot and still feeling sick. Patient requested more antibiotics. I advised patient she should go do UC for evaluation and treatment since she is still concerned she has pneumonia since we have not ACUTE apt available at this time. Patient verbalized understanding. AS, CMA

## 2019-07-01 ENCOUNTER — Ambulatory Visit
Admission: EM | Admit: 2019-07-01 | Discharge: 2019-07-01 | Disposition: A | Discharge status: Home / Self Care | Payer: Medicaid Other | Attending: Physician Assistant | Admitting: Physician Assistant

## 2019-07-01 ENCOUNTER — Emergency Department (HOSPITAL_COMMUNITY): Payer: Medicaid Other

## 2019-07-01 ENCOUNTER — Inpatient Hospital Stay (HOSPITAL_COMMUNITY)
Admission: EM | Admit: 2019-07-01 | Discharge: 2019-07-08 | DRG: 180 | Disposition: A | Discharge status: Home / Self Care | Payer: Medicaid Other | Attending: Internal Medicine | Admitting: Internal Medicine

## 2019-07-01 ENCOUNTER — Encounter (HOSPITAL_COMMUNITY): Payer: Self-pay | Admitting: Radiology

## 2019-07-01 DIAGNOSIS — F419 Anxiety disorder, unspecified: Secondary | ICD-10-CM | POA: Diagnosis present

## 2019-07-01 DIAGNOSIS — R918 Other nonspecific abnormal finding of lung field: Secondary | ICD-10-CM

## 2019-07-01 DIAGNOSIS — J9601 Acute respiratory failure with hypoxia: Secondary | ICD-10-CM | POA: Diagnosis present

## 2019-07-01 DIAGNOSIS — Z9981 Dependence on supplemental oxygen: Secondary | ICD-10-CM

## 2019-07-01 DIAGNOSIS — R9389 Abnormal findings on diagnostic imaging of other specified body structures: Secondary | ICD-10-CM | POA: Diagnosis not present

## 2019-07-01 DIAGNOSIS — D630 Anemia in neoplastic disease: Secondary | ICD-10-CM | POA: Diagnosis present

## 2019-07-01 DIAGNOSIS — C801 Malignant (primary) neoplasm, unspecified: Secondary | ICD-10-CM | POA: Diagnosis not present

## 2019-07-01 DIAGNOSIS — Z20822 Contact with and (suspected) exposure to covid-19: Secondary | ICD-10-CM | POA: Diagnosis present

## 2019-07-01 DIAGNOSIS — Z8701 Personal history of pneumonia (recurrent): Secondary | ICD-10-CM | POA: Diagnosis not present

## 2019-07-01 DIAGNOSIS — R0602 Shortness of breath: Secondary | ICD-10-CM

## 2019-07-01 DIAGNOSIS — Z800 Family history of malignant neoplasm of digestive organs: Secondary | ICD-10-CM | POA: Diagnosis not present

## 2019-07-01 DIAGNOSIS — Z8249 Family history of ischemic heart disease and other diseases of the circulatory system: Secondary | ICD-10-CM

## 2019-07-01 DIAGNOSIS — Z9889 Other specified postprocedural states: Secondary | ICD-10-CM

## 2019-07-01 DIAGNOSIS — R590 Localized enlarged lymph nodes: Secondary | ICD-10-CM | POA: Diagnosis present

## 2019-07-01 DIAGNOSIS — Z818 Family history of other mental and behavioral disorders: Secondary | ICD-10-CM

## 2019-07-01 DIAGNOSIS — Z79899 Other long term (current) drug therapy: Secondary | ICD-10-CM | POA: Diagnosis not present

## 2019-07-01 DIAGNOSIS — F418 Other specified anxiety disorders: Secondary | ICD-10-CM | POA: Diagnosis not present

## 2019-07-01 DIAGNOSIS — R911 Solitary pulmonary nodule: Secondary | ICD-10-CM | POA: Diagnosis not present

## 2019-07-01 DIAGNOSIS — E0590 Thyrotoxicosis, unspecified without thyrotoxic crisis or storm: Secondary | ICD-10-CM | POA: Diagnosis present

## 2019-07-01 DIAGNOSIS — Z77120 Contact with and (suspected) exposure to mold (toxic): Secondary | ICD-10-CM | POA: Diagnosis not present

## 2019-07-01 DIAGNOSIS — Z833 Family history of diabetes mellitus: Secondary | ICD-10-CM

## 2019-07-01 DIAGNOSIS — D649 Anemia, unspecified: Secondary | ICD-10-CM | POA: Diagnosis not present

## 2019-07-01 DIAGNOSIS — Z83438 Family history of other disorder of lipoprotein metabolism and other lipidemia: Secondary | ICD-10-CM | POA: Diagnosis not present

## 2019-07-01 DIAGNOSIS — J189 Pneumonia, unspecified organism: Secondary | ICD-10-CM | POA: Diagnosis not present

## 2019-07-01 DIAGNOSIS — R846 Abnormal cytological findings in specimens from respiratory organs and thorax: Secondary | ICD-10-CM | POA: Diagnosis not present

## 2019-07-01 DIAGNOSIS — C3431 Malignant neoplasm of lower lobe, right bronchus or lung: Secondary | ICD-10-CM | POA: Diagnosis not present

## 2019-07-01 DIAGNOSIS — Z888 Allergy status to other drugs, medicaments and biological substances status: Secondary | ICD-10-CM

## 2019-07-01 DIAGNOSIS — F1290 Cannabis use, unspecified, uncomplicated: Secondary | ICD-10-CM | POA: Diagnosis present

## 2019-07-01 LAB — CBC
HCT: 39.3 % (ref 36.0–46.0)
Hemoglobin: 12 g/dL (ref 12.0–15.0)
MCH: 26.5 pg (ref 26.0–34.0)
MCHC: 30.5 g/dL (ref 30.0–36.0)
MCV: 86.8 fL (ref 80.0–100.0)
Platelets: 690 10*3/uL — ABNORMAL HIGH (ref 150–400)
RBC: 4.53 MIL/uL (ref 3.87–5.11)
RDW: 16.3 % — ABNORMAL HIGH (ref 11.5–15.5)
WBC: 13.9 10*3/uL — ABNORMAL HIGH (ref 4.0–10.5)
nRBC: 0 % (ref 0.0–0.2)

## 2019-07-01 LAB — BASIC METABOLIC PANEL
Anion gap: 9 (ref 5–15)
BUN: <5 mg/dL — ABNORMAL LOW (ref 6–20)
CO2: 26 mmol/L (ref 22–32)
Calcium: 9 mg/dL (ref 8.9–10.3)
Chloride: 104 mmol/L (ref 98–111)
Creatinine, Ser: 0.65 mg/dL (ref 0.44–1.00)
GFR calc Af Amer: >60 mL/min (ref >60)
GFR calc non Af Amer: >60 mL/min (ref >60)
Glucose, Bld: 88 mg/dL (ref 70–99)
Potassium: 5 mmol/L (ref 3.5–5.1)
Sodium: 139 mmol/L (ref 135–145)

## 2019-07-01 LAB — POC SARS CORONAVIRUS 2 AG -  ED: SARS Coronavirus 2 Ag: NEGATIVE

## 2019-07-01 LAB — TROPONIN I (HIGH SENSITIVITY): Troponin I (High Sensitivity): 5 ng/L (ref <18)

## 2019-07-01 LAB — APTT: aPTT: 34 seconds (ref 24–36)

## 2019-07-01 LAB — PROTIME-INR
INR: 1.2 (ref 0.8–1.2)
Prothrombin Time: 15 seconds (ref 11.4–15.2)

## 2019-07-01 LAB — LACTIC ACID, PLASMA: Lactic Acid, Venous: 1.6 mmol/L (ref 0.5–1.9)

## 2019-07-01 LAB — BRAIN NATRIURETIC PEPTIDE: B Natriuretic Peptide: 20 pg/mL (ref 0.0–100.0)

## 2019-07-01 LAB — I-STAT BETA HCG BLOOD, ED (MC, WL, AP ONLY): I-stat hCG, quantitative: <5 m[IU]/mL (ref <5)

## 2019-07-01 MED ORDER — SODIUM CHLORIDE 0.9 % IV SOLN
2.0000 g | INTRAVENOUS | Status: AC
  Administered 2019-07-02 – 2019-07-05 (×5): 2 g via INTRAVENOUS
  Filled 2019-07-01: qty 2
  Filled 2019-07-01: qty 20
  Filled 2019-07-01 (×3): qty 2

## 2019-07-01 MED ORDER — SODIUM CHLORIDE 0.9 % IV SOLN
500.0000 mg | INTRAVENOUS | Status: DC
  Administered 2019-07-02 – 2019-07-03 (×3): 500 mg via INTRAVENOUS
  Filled 2019-07-01 (×4): qty 500

## 2019-07-01 MED ORDER — IOHEXOL 350 MG/ML SOLN
75.0000 mL | Freq: Once | INTRAVENOUS | Status: AC | PRN
  Administered 2019-07-01: 22:00:00 75 mL via INTRAVENOUS

## 2019-07-01 NOTE — ED Provider Notes (Addendum)
[facility]    CSN: [id number] Arrival date & time: 07/01/19  1614     History   Chief Complaint Chief Complaint  Patient presents with  . Shortness of Breath    HPI Sarah Chandler is a 34 y.o. female.   34 year old female comes in for 1 week history of URI symptoms now with dyspnea on exertion.  She was admitted 05/07/2019 for CAP with hypoxia, and was discharged on antibiotics.  She had improvement of symptoms with follow-up 1 week later with primary care.  She is unsure if her symptoms completely resolved prior to current symptom onset.  However, more noticeable in the past 1 week. Denies fever, chills, body aches. Denies abdominal pain, nausea, vomiting, diarrhea. Denies loss of taste/smell. She states used THC few weeks after discharge from hospital, and feels that it caused symptoms to be worse.      Past Medical History:  Diagnosis Date  . Pneumonia 05/08/2019    Patient Active Problem List   Diagnosis Date Noted  . GAD (generalized anxiety disorder) 05/19/2019  . Community acquired pneumonia bilateral 05/08/2019  . 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 Depression 05/08/2019  . Hyperthyroidism 05/08/2019  . Shortness of breath   . Tachycardia     Past Surgical History:  Procedure Laterality Date  . NO PAST SURGERIES    . TOOTH EXTRACTION      OB History   No obstetric history on file.      Home Medications    Prior to Admission medications   Medication Sig Start Date End Date Taking? Authorizing Provider  methimazole (TAPAZOLE) 5 MG tablet Take 1 tablet (5 mg total) by mouth 3 (three) times daily. 06/08/19   Shamleffer, Melanie Crazier, MD  Multiple Vitamin (MULTIVITAMIN WITH MINERALS) TABS tablet Take 1 tablet by mouth daily.    [provider]  OVER THE COUNTER MEDICATION CBD gummies taking 2 daily    [provider]  propranolol (INDERAL) 40 MG tablet Take 1 tablet (40 mg total) by mouth 3 (three) times daily. 06/08/19   Shamleffer,  Melanie Crazier, MD    Family History Family History  Problem Relation Age of Onset  . Diabetes Father   . Hyperlipidemia Father   . Hypertension Father   . Depression Maternal Uncle   . Diabetes Maternal Uncle   . Cancer Maternal Grandmother   . Hyperlipidemia Maternal Grandmother   . Cancer Paternal Grandfather     Social History Social History   Tobacco Use  . Smoking status: Never Smoker  . Smokeless tobacco: Never Used  Substance Use Topics  . Alcohol use: Never  . Drug use: Never     Allergies   Enfamil reguline-iron [alimentum]   Review of Systems Review of Systems  Reason unable to perform ROS: See HPI as above.     Physical Exam Triage Vital Signs ED Triage Vitals  Enc Vitals Group     BP 07/01/19 1631 140/82     Pulse Rate 07/01/19 1631 92     Resp 07/01/19 1631 16     Temp 07/01/19 1631 98.2 F (36.8 C)     Temp Source 07/01/19 1631 Oral     SpO2 07/01/19 1631 (!) 86 %     Weight --      Height --      Head Circumference --      Peak Flow --      Pain Score 07/01/19 1632 0     Pain Loc --  Pain Edu? --      Excl. in [place]? --    No data found.  Updated Vital Signs BP 140/82 (BP Location: Left Arm)   Pulse 92   Temp 98.2 F (36.8 C) (Oral)   Resp 16   LMP 06/24/2019   SpO2 (!) 86%   Physical Exam Constitutional:      General: She is not in acute distress.    Appearance: Normal appearance. She is not ill-appearing, toxic-appearing or diaphoretic.  HENT:     Head: Normocephalic and atraumatic.  Cardiovascular:     Rate and Rhythm: Normal rate and regular rhythm.     Heart sounds: Normal heart sounds. No murmur. No friction rub. No gallop.   Pulmonary:     Effort: Pulmonary effort is normal. No accessory muscle usage, prolonged expiration, respiratory distress or retractions.     Comments: Bilateral lower lobe crackles.  Musculoskeletal:     Cervical back: Normal range of motion and neck supple.  Skin:    General: Skin is  warm and dry.  Neurological:     General: No focal deficit present.     Mental Status: She is alert and oriented to person, place, and time.    UC Treatments / Results  Labs (all labs ordered are listed, but only abnormal results are displayed) Labs Reviewed  POC SARS CORONAVIRUS 2 AG -  ED    EKG   Radiology No results found.  Procedures Procedures (including critical care time)  Medications Ordered in UC Medications - No data to display  Initial Impression / Assessment and Plan / UC Course  I have reviewed the triage vital signs and the nursing notes.  Pertinent labs & imaging results that were available during my care of the patient were reviewed by me and considered in my medical decision making (see chart for details).    34 year old female comes in for 1 week history of URI symptoms now with dyspnea on exertion.  She was admitted 05/07/2019 for CAP with hypoxia, and was discharged on antibiotics.  She had improvement of symptoms with follow-up 1 week later with primary care.  She is unsure if her symptoms completely resolved prior to current symptom onset.  However, more noticeable in the past 1 week.  Denies chest pain, fever, loss of taste or smell.  At triage, patient O2 sat ranging 83 to 86%, though speaking in full sentences.  She does not have any increased work of breathing.  Heart: Regular rate and rhythm Lungs: Bilateral lower lobe crackles.  Good air movement.  Rapid COVID: negative  Given hypoxia, recent admission for pneumonia, continued bilateral lower lobe crackles, will need further evaluation at the ED> Given hypoxia, discussed EMS transport, for which patient declined. She will have sister drive her to the ED. Patient discharged in stable condition to the ED for further evaluation needed.  Final Clinical Impressions(s) / UC Diagnoses   Final diagnoses:  Acute respiratory failure with hypoxia [facility])    ED Prescriptions    None     PDMP not  reviewed this encounter.   Ok Edwards, PA-C 07/01/19 1653    Ok Edwards, PA-C 07/01/19 1657

## 2019-07-01 NOTE — ED Triage Notes (Signed)
Pt c/o SOB with cough and congestion x1wk. States was hospitalized for PNA a few months ago. States send here from PCP for reevaluation. Pt's o2 sat 83%-86%RA, speaking in complete sentences, no distress noted.

## 2019-07-01 NOTE — Discharge Instructions (Signed)
34 year old female comes in for 1 week history of URI symptoms now with dyspnea on exertion.  She was admitted 05/07/2019 for CAP with hypoxia, and was discharged on antibiotics.  She had improvement of symptoms with follow-up 1 week later with primary care.  She is unsure if her symptoms completely resolved prior to current symptom onset.  However, more noticeable in the past 1 week.  Denies chest pain, fever, loss of taste or smell.  At triage, patient O2 sat ranging 83 to 86%, though speaking in full sentences.  She does not have any increased work of breathing.  Heart: Regular rate and rhythm Lungs: Bilateral lower lobe crackles.  Good air movement.  Rapid COVID: PENDING  Given hypoxia, recent admission for pneumonia, continued bilateral lower lobe crackles, patient discharged in stable condition to the emergency department for further evaluation.

## 2019-07-01 NOTE — ED Provider Notes (Signed)
[place] EMERGENCY DEPARTMENT Provider Note   CSN: [id number] Arrival date & time: 07/01/19  1942     History Chief Complaint  Patient presents with  . Shortness of Breath    Sarah Chandler is a 34 y.o. female.  34 year old female sent from urgent care due to concern for possible pneumonia.  Patient states she has had a cough that is been productive but denies any fever or chills.  No vomiting or diarrhea.  No leg pain or swelling.  Was diagnosed with pneumonia just recently and had a hospitalization for same.  Did have a point-of-care Covid test done by her doctor today which was negative.  Denies any loss of taste or smell.  States that she has had increasing dyspnea exertion without chest pain.  Patient sent in for further management        Past Medical History:  Diagnosis Date  . Pneumonia 05/08/2019    Patient Active Problem List   Diagnosis Date Noted  . GAD (generalized anxiety disorder) 05/19/2019  . Community acquired pneumonia bilateral 05/08/2019  . 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 Depression 05/08/2019  . Hyperthyroidism 05/08/2019  . Shortness of breath   . Tachycardia     Past Surgical History:  Procedure Laterality Date  . NO PAST SURGERIES    . TOOTH EXTRACTION       OB History   No obstetric history on file.     Family History  Problem Relation Age of Onset  . Diabetes Father   . Hyperlipidemia Father   . Hypertension Father   . Depression Maternal Uncle   . Diabetes Maternal Uncle   . Cancer Maternal Grandmother   . Hyperlipidemia Maternal Grandmother   . Cancer Paternal Grandfather     Social History   Tobacco Use  . Smoking status: Never Smoker  . Smokeless tobacco: Never Used  Substance Use Topics  . Alcohol use: Never  . Drug use: Never    Home Medications Prior to Admission medications   Medication Sig Start Date End Date Taking? Authorizing Provider  methimazole (TAPAZOLE) 5 MG tablet Take 1 tablet (5  mg total) by mouth 3 (three) times daily. 06/08/19   Shamleffer, Melanie Crazier, MD  Multiple Vitamin (MULTIVITAMIN WITH MINERALS) TABS tablet Take 1 tablet by mouth daily.    [provider]  OVER THE COUNTER MEDICATION CBD gummies taking 2 daily    [provider]  propranolol (INDERAL) 40 MG tablet Take 1 tablet (40 mg total) by mouth 3 (three) times daily. 06/08/19   Shamleffer, Melanie Crazier, MD    Allergies    Enfamil reguline-iron [alimentum]  Review of Systems   Review of Systems  All other systems reviewed and are negative.   Physical Exam Updated Vital Signs BP 128/81 (BP Location: Left Arm)   Pulse 93   Temp 98.1 F (36.7 C) (Oral)   Resp 20   Ht 1.778 m (5\' 10" )   Wt 86.2 kg   LMP 06/24/2019   SpO2 (!) 85%   BMI 27.26 kg/m   Physical Exam Vitals and nursing note reviewed.  Constitutional:      General: She is not in acute distress.    Appearance: Normal appearance. She is well-developed. She is not toxic-appearing.  HENT:     Head: Normocephalic and atraumatic.  Eyes:     General: Lids are normal.     Conjunctiva/sclera: Conjunctivae normal.     Pupils: Pupils are equal, round, and reactive  to light.  Neck:     Thyroid: No thyroid mass.     Trachea: No tracheal deviation.  Cardiovascular:     Rate and Rhythm: Normal rate and regular rhythm.     Heart sounds: Normal heart sounds. No murmur. No gallop.   Pulmonary:     Effort: Pulmonary effort is normal. No respiratory distress.     Breath sounds: Normal breath sounds. No stridor. No decreased breath sounds, wheezing, rhonchi or rales.  Abdominal:     General: Bowel sounds are normal. There is no distension.     Palpations: Abdomen is soft.     Tenderness: There is no abdominal tenderness. There is no rebound.  Musculoskeletal:        General: No tenderness. Normal range of motion.     Cervical back: Normal range of motion and neck supple.  Skin:    General: Skin is warm and dry.      Findings: No abrasion or rash.  Neurological:     Mental Status: She is alert and oriented to person, place, and time.     GCS: GCS eye subscore is 4. GCS verbal subscore is 5. GCS motor subscore is 6.     Cranial Nerves: No cranial nerve deficit.     Sensory: No sensory deficit.  Psychiatric:        Speech: Speech normal.        Behavior: Behavior normal.     ED Results / Procedures / Treatments   Labs (all labs ordered are listed, but only abnormal results are displayed) Labs Reviewed  CBC - Abnormal; Notable for the following components:      Result Value   WBC 13.9 (*)    RDW 16.3 (*)    Platelets 690 (*)    All other components within normal limits  BASIC METABOLIC PANEL - Abnormal; Notable for the following components:   BUN <5 (*)    All other components within normal limits  SARS CORONAVIRUS 2 (TAT 6-24 HRS)  I-STAT BETA HCG BLOOD, ED ([facility], [facility], [facility] ONLY)    EKG EKG Interpretation  Date/Time:  Wednesday July 01 2019 19:51:26 EDT Ventricular Rate:  92 PR Interval:  152 QRS Duration: 86 QT Interval:  344 QTC Calculation: 425 R Axis:   87 Text Interpretation: Normal sinus rhythm Normal ECG Confirmed by Lacretia Leigh ([id number]) on 07/01/2019 8:57:30 PM   Radiology No results found.  Procedures Procedures (including critical care time)  Medications Ordered in ED Medications - No data to display  ED Course  I have reviewed the triage vital signs and the nursing notes.  Pertinent labs & imaging results that were available during my care of the patient were reviewed by me and considered in my medical decision making (see chart for details).    MDM Rules/Calculators/A&P                      Patient CT scan was negative for PE but does show atypical infection.  Started on IV antibiotics.  Will admit to the hospitalist service Final Clinical Impression(s) / ED Diagnoses Final diagnoses:  None    Rx / DC Orders ED Discharge Orders    None       Lacretia Leigh, MD 07/03/19 2329

## 2019-07-01 NOTE — ED Triage Notes (Signed)
Pt in POV, sent from [facility] for SOB/low O2 sats. 84% RA. States she was hospitalized in January for PNA. No distress noted, speaking in complete sentences. Placed on 4L [place], sats 90%.

## 2019-07-01 NOTE — ED Notes (Signed)
Pt transported to CT ?

## 2019-07-02 ENCOUNTER — Other Ambulatory Visit: Payer: Self-pay

## 2019-07-02 ENCOUNTER — Encounter (HOSPITAL_COMMUNITY): Payer: Self-pay | Admitting: Internal Medicine

## 2019-07-02 DIAGNOSIS — J189 Pneumonia, unspecified organism: Secondary | ICD-10-CM

## 2019-07-02 DIAGNOSIS — E0590 Thyrotoxicosis, unspecified without thyrotoxic crisis or storm: Secondary | ICD-10-CM

## 2019-07-02 DIAGNOSIS — J9601 Acute respiratory failure with hypoxia: Secondary | ICD-10-CM

## 2019-07-02 LAB — SARS CORONAVIRUS 2 (TAT 6-24 HRS): SARS Coronavirus 2: NEGATIVE

## 2019-07-02 LAB — RESPIRATORY PANEL BY PCR

## 2019-07-02 LAB — TROPONIN I (HIGH SENSITIVITY): Troponin I (High Sensitivity): 4 ng/L (ref <18)

## 2019-07-02 LAB — SEDIMENTATION RATE: Sed Rate: 77 mm/hr — ABNORMAL HIGH (ref 0–22)

## 2019-07-02 LAB — DIFFERENTIAL
Abs Immature Granulocytes: 0.02 10*3/uL (ref 0.00–0.07)
Basophils Absolute: 0.1 10*3/uL (ref 0.0–0.1)
Basophils Relative: 1 %
Eosinophils Absolute: 0.1 10*3/uL (ref 0.0–0.5)
Eosinophils Relative: 1 %
Immature Granulocytes: 0 %
Lymphocytes Relative: 23 %
Lymphs Abs: 2.3 10*3/uL (ref 0.7–4.0)
Monocytes Absolute: 0.7 10*3/uL (ref 0.1–1.0)
Monocytes Relative: 7 %
Neutro Abs: 6.8 10*3/uL (ref 1.7–7.7)
Neutrophils Relative %: 68 %

## 2019-07-02 LAB — URINE CULTURE

## 2019-07-02 LAB — URINALYSIS, ROUTINE W REFLEX MICROSCOPIC
Bilirubin Urine: NEGATIVE
Glucose, UA: NEGATIVE mg/dL
Hgb urine dipstick: NEGATIVE
Ketones, ur: NEGATIVE mg/dL
Leukocytes,Ua: NEGATIVE
Nitrite: NEGATIVE
Protein, ur: NEGATIVE mg/dL
Specific Gravity, Urine: 1.017 (ref 1.005–1.030)
pH: 6 (ref 5.0–8.0)

## 2019-07-02 LAB — CRYPTOCOCCAL ANTIGEN: Crypto Ag: NEGATIVE

## 2019-07-02 LAB — LACTIC ACID, PLASMA: Lactic Acid, Venous: 1.4 mmol/L (ref 0.5–1.9)

## 2019-07-02 LAB — PROCALCITONIN: Procalcitonin: <0.1 ng/mL

## 2019-07-02 LAB — STREP PNEUMONIAE URINARY ANTIGEN: Strep Pneumo Urinary Antigen: NEGATIVE

## 2019-07-02 MED ORDER — HYDROXYZINE HCL 25 MG PO TABS
25.0000 mg | ORAL_TABLET | Freq: Three times a day (TID) | ORAL | Status: DC | PRN
  Administered 2019-07-02 – 2019-07-08 (×4): 25 mg via ORAL
  Filled 2019-07-02 (×4): qty 1

## 2019-07-02 MED ORDER — ACETAMINOPHEN 325 MG PO TABS
650.0000 mg | ORAL_TABLET | Freq: Four times a day (QID) | ORAL | Status: DC | PRN
  Administered 2019-07-04 – 2019-07-05 (×2): 650 mg via ORAL
  Filled 2019-07-02 (×2): qty 2

## 2019-07-02 MED ORDER — METHIMAZOLE 5 MG PO TABS
5.0000 mg | ORAL_TABLET | Freq: Three times a day (TID) | ORAL | Status: DC
  Administered 2019-07-02 – 2019-07-08 (×18): 5 mg via ORAL
  Filled 2019-07-02 (×19): qty 1

## 2019-07-02 MED ORDER — ORAL CARE MOUTH RINSE
15.0000 mL | Freq: Two times a day (BID) | OROMUCOSAL | Status: DC
  Administered 2019-07-02 – 2019-07-07 (×8): 15 mL via OROMUCOSAL

## 2019-07-02 MED ORDER — PROPRANOLOL HCL 40 MG PO TABS
40.0000 mg | ORAL_TABLET | Freq: Three times a day (TID) | ORAL | Status: DC
  Administered 2019-07-02 – 2019-07-08 (×18): 40 mg via ORAL
  Filled 2019-07-02 (×19): qty 1

## 2019-07-02 MED ORDER — ONDANSETRON HCL 4 MG/2ML IJ SOLN: 4.0000 mg | Freq: Four times a day (QID) | INTRAMUSCULAR | Status: DC | PRN

## 2019-07-02 MED ORDER — ACETAMINOPHEN 650 MG RE SUPP: 650.0000 mg | Freq: Four times a day (QID) | RECTAL | Status: DC | PRN

## 2019-07-02 MED ORDER — ONDANSETRON HCL 4 MG PO TABS: 4.0000 mg | ORAL_TABLET | Freq: Four times a day (QID) | ORAL | Status: DC | PRN

## 2019-07-02 NOTE — ED Notes (Signed)
Tele   bfast ordered  

## 2019-07-02 NOTE — Progress Notes (Signed)
PROGRESS NOTE  Sarah Chandler DJT:[id number] DOB: 07/11/1985   PCP: Mellody Dance, DO  Patient is from: Home.  DOA: 07/01/2019 LOS: 1  Brief Narrative / Interim history: 34 year old female with history hyperthyroidism and recent hospitalization for pneumonia in January 2021 presenting with shortness of breath, productive cough dyspnea on exertion.  No fever, chills, chest pain or edema.  Denies smoking cigarettes or vaping but smokes marijuana.  No known sick contacts.  Used to have some chickens at home before his previous hospitalization.   In ED, HDS.  85% on RA requiring 4 L to recover.  WBC 14.  Platelets 690.  Otherwise, CBC and CMP without significant finding.  LA, protocol, UA, COVID-19, BNP and HS trop negative.  CTA chest negative for PE but progression of multifocal bilateral airspace disease concerning for atypical bacterial or fungal pneumonia, LAD with in bilateral axillary regions, mediastinum and upper abdomen.  Cultures obtained. Started on ceftriaxone and azithromycin for possible CAP.  Subjective: Seen and examined earlier this morning.  No major events this morning.  Reports improvement in her breathing and cough.  Cough is productive but she never spitted up to characterize.  Does not think she has hemoptysis.  Denies chest pain, fever, GI or UTI symptoms.  Objective: Vitals:   07/02/19 1250 07/02/19 1500 07/02/19 1520 07/02/19 1536  BP:   123/81   Pulse:   94   Resp:   20   Temp:   (!) 96.8 F (36 C) 98.2 F (36.8 C)  TempSrc:   Oral Oral  SpO2: 96% 92% 94%   Weight:      Height:        Intake/Output Summary (Last 24 hours) at 07/02/2019 1650 Last data filed at 07/02/2019 0120 Gross per 24 hour  Intake 303.8 ml  Output --  Net 303.8 ml   Filed Weights   07/01/19 1948  Weight: 86.2 kg    Examination:  GENERAL: No acute distress.  Appears well.  HEENT: MMM.  Vision and hearing grossly intact.  NECK: Supple.  Left posterior cervical LAD. RESP:  94% on on 2.5 L.  No IWOB.  Bilateral global crackles. CVS:  RRR. Heart sounds normal.  ABD/GI/GU: Bowel sounds present. Soft. Non tender.  MSK/EXT:  Moves extremities. No apparent deformity. No edema.  SKIN: no apparent skin lesion or wound NEURO: Awake, alert and oriented appropriately.  No apparent focal neuro deficit. PSYCH: Calm. Normal affect.  Procedures:  None  Assessment & Plan: Acute respiratory failure with hypoxia due to multifocal pneumonia-patient with progressive SOB, cough and DOE.  CTA with progressive multifocal infiltrate and LADs but no fever and leukocytosis.  COVID-19, RVP, lactic acid, pro-Cal and blood cultures negative.  Reports smoking marijuana but no cigarette or IV drug use.  ESR 77. -PCCM recs-UDS,  hypersensitivity panel, basic CTD serologies and bronchoscopy -Wean oxygen as able -Incentive spirometry  Multifocal pneumonia? -Continue ceftriaxone and azithromycin -Check urine Legionella  Hyperthyroidism -Check TSH -Continue propranolol and methimazole  Marijuana use-reports smoking marijuana. -Follow UDS -Discouraged               DVT prophylaxis: SCD Code Status: Full code Family Communication: Patient and/or RN. Available if any question.   Discharge barrier: Evaluation and treatment for multifocal pneumonia Patient is from: Home Final disposition: Likely home in the next 48 to 72 hours  Consultants: PCCM   Microbiology summarized: COVID-19 negative. RVP panel negative Blood culture negative  Sch Meds:  Scheduled Meds:  mouth rinse  15 mL Mouth Rinse BID   methimazole  5 mg Oral TID   propranolol  40 mg Oral TID   Continuous Infusions:  azithromycin Stopped (07/02/19 0120)   cefTRIAXone (ROCEPHIN)  IV Stopped (07/02/19 0120)   PRN Meds:.acetaminophen **OR** acetaminophen, ondansetron **OR** ondansetron (ZOFRAN) IV  Antimicrobials: Anti-infectives (From admission, onward)   Start     Dose/Rate Route Frequency  Ordered Stop   07/01/19 2230  cefTRIAXone (ROCEPHIN) 2 g in sodium chloride 0.9 % 100 mL IVPB     2 g 200 mL/hr over 30 Minutes Intravenous Every 24 hours 07/01/19 2216     07/01/19 2230  azithromycin (ZITHROMAX) 500 mg in sodium chloride 0.9 % 250 mL IVPB     500 mg 250 mL/hr over 60 Minutes Intravenous Every 24 hours 07/01/19 2216         I have personally reviewed the following labs and images: CBC: Recent Labs  Lab 07/01/19 1954  WBC 13.9*  HGB 12.0  HCT 39.3  MCV 86.8  PLT 690*   BMP &GFR Recent Labs  Lab 07/01/19 1954  NA 139  K 5.0  CL 104  CO2 26  GLUCOSE 88  BUN <5*  CREATININE 0.65  CALCIUM 9.0   Estimated Creatinine Clearance: 120.5 mL/min (by C-G formula based on SCr of 0.65 mg/dL). Liver & Pancreas: No results for input(s): AST, ALT, ALKPHOS, BILITOT, PROT, ALBUMIN in the last 168 hours. No results for input(s): LIPASE, AMYLASE in the last 168 hours. No results for input(s): AMMONIA in the last 168 hours. Diabetic: No results for input(s): HGBA1C in the last 72 hours. No results for input(s): GLUCAP in the last 168 hours. Cardiac Enzymes: No results for input(s): CKTOTAL, CKMB, CKMBINDEX, TROPONINI in the last 168 hours. No results for input(s): PROBNP in the last 8760 hours. Coagulation Profile: Recent Labs  Lab 07/01/19 2123  INR 1.2   Thyroid Function Tests: No results for input(s): TSH, T4TOTAL, FREET4, T3FREE, THYROIDAB in the last 72 hours. Lipid Profile: No results for input(s): CHOL, HDL, LDLCALC, TRIG, CHOLHDL, LDLDIRECT in the last 72 hours. Anemia Panel: No results for input(s): VITAMINB12, FOLATE, FERRITIN, TIBC, IRON, RETICCTPCT in the last 72 hours. Urine analysis:    Component Value Date/Time   COLORURINE STRAW (A) 07/01/2019 2324   APPEARANCEUR CLEAR 07/01/2019 2324   LABSPEC 1.017 07/01/2019 2324   PHURINE 6.0 07/01/2019 2324   GLUCOSEU NEGATIVE 07/01/2019 2324   HGBUR NEGATIVE 07/01/2019 2324   BILIRUBINUR NEGATIVE  07/01/2019 2324   KETONESUR NEGATIVE 07/01/2019 2324   PROTEINUR NEGATIVE 07/01/2019 2324   NITRITE NEGATIVE 07/01/2019 2324   LEUKOCYTESUR NEGATIVE 07/01/2019 2324   Sepsis Labs: Invalid input(s): PROCALCITONIN, [place]  Microbiology: Recent Results (from the past 240 hour(s))  Blood Culture (routine x 2)     Status: None (Preliminary result)   Collection Time: 07/01/19  9:19 PM   Specimen: BLOOD  Result Value Ref Range Status   Specimen Description BLOOD RIGHT ANTECUBITAL  Final   Special Requests   Final    BOTTLES DRAWN AEROBIC AND ANAEROBIC Blood Culture adequate volume   Culture   Final    NO GROWTH < 12 HOURS Performed at [facility], [address], [place], [place] [address]    Report Status PENDING  Incomplete  SARS CORONAVIRUS 2 (TAT 6-24 HRS) Nasopharyngeal Nasopharyngeal Swab     Status: None   Collection Time: 07/01/19  9:23 PM   Specimen: Nasopharyngeal Swab  Result Value Ref Range Status  SARS Coronavirus 2 NEGATIVE NEGATIVE Final    Comment: (NOTE) SARS-CoV-2 target nucleic acids are NOT DETECTED. The SARS-CoV-2 RNA is generally detectable in upper and lower respiratory specimens during the acute phase of infection. Negative results do not preclude SARS-CoV-2 infection, do not rule out co-infections with other pathogens, and should not be used as the sole basis for treatment or other patient management decisions. Negative results must be combined with clinical observations, patient history, and epidemiological information. The expected result is Negative. Fact Sheet for Patients: SugarRoll.be Fact Sheet for Healthcare Providers: https://www.woods-mathews.com/ This test is not yet approved or cleared by the [place] FDA and  has been authorized for detection and/or diagnosis of SARS-CoV-2 by FDA under an Emergency Use Authorization (EUA). This EUA will remain  in effect (meaning this test can be  used) for the duration of the COVID-19 declaration under Section 56 4(b)(1) of the Act, 21 U.S.C. section 360bbb-3(b)(1), unless the authorization is terminated or revoked sooner. Performed at [facility], [place] [address] [place], [place] [address]   Blood Culture (routine x 2)     Status: None (Preliminary result)   Collection Time: 07/01/19 11:26 PM   Specimen: BLOOD RIGHT HAND  Result Value Ref Range Status   Specimen Description BLOOD RIGHT HAND  Final   Special Requests   Final    BOTTLES DRAWN AEROBIC AND ANAEROBIC Blood Culture adequate volume   Culture   Final    NO GROWTH < 12 HOURS Performed at [facility], [place] [address] [place], [place] [address]    Report Status PENDING  Incomplete  Respiratory Panel by PCR     Status: None   Collection Time: 07/02/19 12:46 PM   Specimen: Nasopharyngeal Swab; Respiratory  Result Value Ref Range Status   Adenovirus NOT DETECTED NOT DETECTED Final   Coronavirus 229E NOT DETECTED NOT DETECTED Final    Comment: (NOTE) The Coronavirus on the Respiratory Panel, DOES NOT test for the novel  Coronavirus (2019 nCoV)    Coronavirus HKU1 NOT DETECTED NOT DETECTED Final   Coronavirus NL63 NOT DETECTED NOT DETECTED Final   Coronavirus OC43 NOT DETECTED NOT DETECTED Final   Metapneumovirus NOT DETECTED NOT DETECTED Final   Rhinovirus / Enterovirus NOT DETECTED NOT DETECTED Final   Influenza A NOT DETECTED NOT DETECTED Final   Influenza B NOT DETECTED NOT DETECTED Final   Parainfluenza Virus 1 NOT DETECTED NOT DETECTED Final   Parainfluenza Virus 2 NOT DETECTED NOT DETECTED Final   Parainfluenza Virus 3 NOT DETECTED NOT DETECTED Final   Parainfluenza Virus 4 NOT DETECTED NOT DETECTED Final   Respiratory Syncytial Virus NOT DETECTED NOT DETECTED Final   Bordetella pertussis NOT DETECTED NOT DETECTED Final   Chlamydophila pneumoniae NOT DETECTED NOT DETECTED Final   Mycoplasma pneumoniae NOT DETECTED NOT DETECTED Final     Comment: Performed at [facility], [place]. [address], [place], [place] [address]    Radiology Studies: CT Angio Chest PE W/Cm &/Or Wo Cm  Result Date: 07/01/2019 CLINICAL DATA:  Shortness of breath, cough, recent diagnosis of pneumonia EXAM: CT ANGIOGRAPHY CHEST WITH CONTRAST TECHNIQUE: Multidetector CT imaging of the chest was performed using the standard protocol during bolus administration of intravenous contrast. Multiplanar CT image reconstructions and MIPs were obtained to evaluate the vascular anatomy. CONTRAST:  66m OMNIPAQUE IOHEXOL 350 MG/ML SOLN COMPARISON:  05/08/2019, 07/01/2019 FINDINGS: Cardiovascular: This is a technically adequate evaluation of the pulmonary vasculature. There are no filling defects  or pulmonary emboli. The heart is unremarkable without pericardial effusion. Thoracic aorta is normal in caliber with no evidence of dissection. Mediastinum/Nodes: There is bilateral axillary adenopathy. Largest lymph node in the right axilla measures 27 x 14 mm. Multiple subcentimeter lymph nodes within the mediastinum are stable. The thyroid, esophagus, and trachea are unremarkable. Lungs/Pleura: Since the prior exam, there has been marked progression of the bilateral nodular airspace disease. Marked increase in dense consolidation at the lung bases bilaterally. There is persistent diffuse interlobular septal thickening. Small bilateral pleural effusions have developed in the interim, right greater than left. No pneumothorax. The central airways are patent. Upper Abdomen: Multiple subcentimeter lymph nodes are seen in the gastrohepatic ligament, largest measuring 9 mm reference image 116. No other acute abnormalities. Musculoskeletal: Innumerable punctate sclerotic foci are seen throughout the thoracic spine and sternum, nonspecific. There are no acute or destructive bony lesions. Review of the MIP images confirms the above findings. IMPRESSION: 1. Progression of the multifocal bilateral  airspace disease seen previously. The persistence an appearance would favor atypical bacterial or fungal pneumonia. Correlation with bronchoscopy and sputum analysis may be useful. 2. Small bilateral pleural effusions, right greater than left. 3. Lymphadenopathy within the bilateral axillary regions, mediastinum, and upper abdomen, nonspecific. No significant change since prior study. 4. No evidence of pulmonary embolus. Electronically Signed   By: Randa Ngo M.D.   On: 07/01/2019 22:32   DG Chest Portable 1 View  Result Date: 07/01/2019 CLINICAL DATA:  Shortness of breath. EXAM: PORTABLE CHEST 1 VIEW COMPARISON:  May 07, 2019 FINDINGS: Worsening bibasilar pulmonary infiltrates. Probable small associated effusions. The heart, hila, and mediastinum are unchanged. No other interval changes. IMPRESSION: Worsening bibasilar infiltrates with small associated effusions. This may represent pneumonia or aspiration. It would be unusual for pneumonia two persist since May 07, 2019 however. Recurrent pneumonia or repeated aspiration are possibilities. Recommend clinical correlation and short-term follow-up imaging to ensure resolution. Electronically Signed   By: Dorise Bullion III M.D   On: 07/01/2019 21:14    25 minutes with more than 50% spent in reviewing records, counseling patient/family and coordinating care.   Mell Guia T. [place]  If 7PM-7AM, please contact night-coverage [email or web address] Password [facility] 07/02/2019, 4:50 PM

## 2019-07-02 NOTE — H&P (Signed)
History and Physical    DAIZY OUTEN FAO:[id number] DOB: 20-Nov-1985 DOA: 07/01/2019  PCP: Mellody Dance, DO  Patient coming from: Home.  Chief Complaint: Shortness of breath.  HPI: Sarah Chandler is a 34 y.o. female with history of hypothyroidism recently admitted in January 2021 for pneumonia at that time was treated with antibiotics and patient's symptoms had resolved started developing shortness of breath with cough mostly on exertion for the last 1 week.  Denies any fever chills.  Denies any chest pain.  Given the ongoing symptoms patient presents to the ER.  Patient denies any recent travel or sick contacts.  Has not taken any new medication except that patient was diagnosed with hyperthyroidism and is on Tapazole and propranolol for last 3 months.  Patient used to take care of chickens at home which patient has not done since last pneumonia about 3 months ago.  ED Course: In the ER patient was hypoxic requiring 4 L oxygen afebrile Covid test was negative CT angiogram of chest shows bilateral worsening infiltrates with lymphadenopathy and pleural effusion concerning for either atypical infection or fungal pneumonia.  BNP was normal lactic acid was negative.  EKG shows normal sinus rhythm.  Had blood cultures drawn and started on empiric antibiotics admitted for further management.  Review of Systems: As per HPI, rest all negative.   Past Medical History:  Diagnosis Date  . Pneumonia 05/08/2019    Past Surgical History:  Procedure Laterality Date  . NO PAST SURGERIES    . TOOTH EXTRACTION       reports that she has never smoked. She has never used smokeless tobacco. She reports that she does not drink alcohol or use drugs.  Allergies  Allergen Reactions  . Iron Other (See Comments)    Ferrosol - Liquid  Unknown reaction     Family History  Problem Relation Age of Onset  . Diabetes Father   . Hyperlipidemia Father   . Hypertension Father   . Depression Maternal  Uncle   . Diabetes Maternal Uncle   . Cancer Maternal Grandmother   . Hyperlipidemia Maternal Grandmother   . Cancer Paternal Grandfather     Prior to Admission medications   Medication Sig Start Date End Date Taking? Authorizing Provider  acetaminophen (TYLENOL) 500 MG tablet Take 1,000 mg by mouth every 6 (six) hours as needed for moderate pain or headache.   Yes [provider]  guaifenesin (ROBITUSSIN) 100 MG/5ML syrup Take 200 mg by mouth 3 (three) times daily as needed for cough.   Yes [provider]  methimazole (TAPAZOLE) 5 MG tablet Take 1 tablet (5 mg total) by mouth 3 (three) times daily. 06/08/19  Yes Shamleffer, Melanie Crazier, MD  Multiple Vitamin (MULTIVITAMIN WITH MINERALS) TABS tablet Take 2 tablets by mouth daily. Gummies   Yes [provider]  OVER THE COUNTER MEDICATION Take 2 each by mouth daily as needed (anxiety). CBD gummies   Yes [provider]  propranolol (INDERAL) 40 MG tablet Take 1 tablet (40 mg total) by mouth 3 (three) times daily. 06/08/19  Yes Shamleffer, Melanie Crazier, MD    Physical Exam: Constitutional: Moderately built and nourished. Vitals:   07/01/19 1948 07/02/19 0016  BP: 128/81 115/78  Pulse: 93 85  Resp: 20 (!) 21  Temp: 98.1 F (36.7 C)   TempSrc: Oral   SpO2: (!) 85% 96%  Weight: 86.2 kg   Height: 5\' 10"  (1.778 m)    Eyes: Anicteric no pallor. ENMT:  No discharge from the ears eyes nose or mouth. Neck: No mass felt.  No neck rigidity. Respiratory: No rhonchi or crepitations. Cardiovascular: S1-S2 heard. Abdomen: Soft nontender bowel sounds present. Musculoskeletal: No edema. Skin: No rash. Neurologic: Alert awake oriented time place and person.  Moves all extremities. Psychiatric: Appears normal per normal affect.   Labs on Admission: I have personally reviewed following labs and imaging studies  CBC: Recent Labs  Lab 07/01/19 1954  WBC 13.9*  HGB 12.0  HCT 39.3  MCV 86.8  PLT 690*     Basic Metabolic Panel: Recent Labs  Lab 07/01/19 1954  NA 139  K 5.0  CL 104  CO2 26  GLUCOSE 88  BUN <5*  CREATININE 0.65  CALCIUM 9.0   GFR: Estimated Creatinine Clearance: 120.5 mL/min (by C-G formula based on SCr of 0.65 mg/dL). Liver Function Tests: No results for input(s): AST, ALT, ALKPHOS, BILITOT, PROT, ALBUMIN in the last 168 hours. No results for input(s): LIPASE, AMYLASE in the last 168 hours. No results for input(s): AMMONIA in the last 168 hours. Coagulation Profile: Recent Labs  Lab 07/01/19 2123  INR 1.2   Cardiac Enzymes: No results for input(s): CKTOTAL, CKMB, CKMBINDEX, TROPONINI in the last 168 hours. BNP (last 3 results) No results for input(s): PROBNP in the last 8760 hours. HbA1C: No results for input(s): HGBA1C in the last 72 hours. CBG: No results for input(s): GLUCAP in the last 168 hours. Lipid Profile: No results for input(s): CHOL, HDL, LDLCALC, TRIG, CHOLHDL, LDLDIRECT in the last 72 hours. Thyroid Function Tests: No results for input(s): TSH, T4TOTAL, FREET4, T3FREE, THYROIDAB in the last 72 hours. Anemia Panel: No results for input(s): VITAMINB12, FOLATE, FERRITIN, TIBC, IRON, RETICCTPCT in the last 72 hours. Urine analysis:    Component Value Date/Time   COLORURINE YELLOW 05/08/2019 0125   APPEARANCEUR HAZY (A) 05/08/2019 0125   LABSPEC 1.036 (H) 05/08/2019 0125   PHURINE 6.0 05/08/2019 0125   GLUCOSEU NEGATIVE 05/08/2019 0125   HGBUR LARGE (A) 05/08/2019 0125   BILIRUBINUR NEGATIVE 05/08/2019 0125   KETONESUR 20 (A) 05/08/2019 0125   PROTEINUR NEGATIVE 05/08/2019 0125   NITRITE NEGATIVE 05/08/2019 0125   LEUKOCYTESUR NEGATIVE 05/08/2019 0125   Sepsis Labs: @LABRCNTIP (procalcitonin:4,lacticidven:4) )No results found for this or any previous visit (from the past 240 hour(s)).   Radiological Exams on Admission: CT Angio Chest PE W/Cm &/Or Wo Cm  Result Date: 07/01/2019 CLINICAL DATA:  Shortness of breath, cough, recent  diagnosis of pneumonia EXAM: CT ANGIOGRAPHY CHEST WITH CONTRAST TECHNIQUE: Multidetector CT imaging of the chest was performed using the standard protocol during bolus administration of intravenous contrast. Multiplanar CT image reconstructions and MIPs were obtained to evaluate the vascular anatomy. CONTRAST:  51mL OMNIPAQUE IOHEXOL 350 MG/ML SOLN COMPARISON:  05/08/2019, 07/01/2019 FINDINGS: Cardiovascular: This is a technically adequate evaluation of the pulmonary vasculature. There are no filling defects or pulmonary emboli. The heart is unremarkable without pericardial effusion. Thoracic aorta is normal in caliber with no evidence of dissection. Mediastinum/Nodes: There is bilateral axillary adenopathy. Largest lymph node in the right axilla measures 27 x 14 mm. Multiple subcentimeter lymph nodes within the mediastinum are stable. The thyroid, esophagus, and trachea are unremarkable. Lungs/Pleura: Since the prior exam, there has been marked progression of the bilateral nodular airspace disease. Marked increase in dense consolidation at the lung bases bilaterally. There is persistent diffuse interlobular septal thickening. Small bilateral pleural effusions have developed in the interim, right greater than left. No pneumothorax. The central airways  are patent. Upper Abdomen: Multiple subcentimeter lymph nodes are seen in the gastrohepatic ligament, largest measuring 9 mm reference image 116. No other acute abnormalities. Musculoskeletal: Innumerable punctate sclerotic foci are seen throughout the thoracic spine and sternum, nonspecific. There are no acute or destructive bony lesions. Review of the MIP images confirms the above findings. IMPRESSION: 1. Progression of the multifocal bilateral airspace disease seen previously. The persistence an appearance would favor atypical bacterial or fungal pneumonia. Correlation with bronchoscopy and sputum analysis may be useful. 2. Small bilateral pleural effusions, right  greater than left. 3. Lymphadenopathy within the bilateral axillary regions, mediastinum, and upper abdomen, nonspecific. No significant change since prior study. 4. No evidence of pulmonary embolus. Electronically Signed   By: Randa Ngo M.D.   On: 07/01/2019 22:32   DG Chest Portable 1 View  Result Date: 07/01/2019 CLINICAL DATA:  Shortness of breath. EXAM: PORTABLE CHEST 1 VIEW COMPARISON:  May 07, 2019 FINDINGS: Worsening bibasilar pulmonary infiltrates. Probable small associated effusions. The heart, hila, and mediastinum are unchanged. No other interval changes. IMPRESSION: Worsening bibasilar infiltrates with small associated effusions. This may represent pneumonia or aspiration. It would be unusual for pneumonia two persist since May 07, 2019 however. Recurrent pneumonia or repeated aspiration are possibilities. Recommend clinical correlation and short-term follow-up imaging to ensure resolution. Electronically Signed   By: Dorise Bullion III M.D   On: 07/01/2019 21:14    EKG: Independently reviewed.  Normal sinus rhythm.  Assessment/Plan Principal Problem:   Acute respiratory failure with hypoxia (HCC) Active Problems:   Hyperthyroidism    1. Acute respiratory failure hypoxia with bilateral infiltrates in the CAT scan of the chest -for now patient empirically placed on antibiotics for community-acquired pneumonia.  Will consult pulmonologist for likely need for bronchoscopy and further input. 2. Hypothyroidism on propranolol and methimazole.  Given that patient has acute respiratory failure with hypoxia requiring 4 L oxygen will need close monitoring for any further deterioration in inpatient status.   DVT prophylaxis: SCDs for now in anticipation of procedure will avoid anticoagulation. Code Status: Full code. Family Communication: Discussed with patient. Disposition Plan: Home. Consults called: Pulmonologist. Admission status: Inpatient.   Rise Patience  MD [facility] Pager [phone].  If 7PM-7AM, please contact night-coverage [email or web address] Password [email or web address]  07/02/2019, 1:03 AM

## 2019-07-02 NOTE — Consult Note (Signed)
NAME:  Sarah Chandler, MRN:  [id number], DOB:  22-Oct-1985, LOS: 1 ADMISSION DATE:  07/01/2019, CONSULTATION DATE:  3/25 REFERRING MD:  Hal Hope, CHIEF COMPLAINT:  Respiratory failure w/ abnormal CT chest    Brief History   34 year old female admitted 3/24 w/ recurrent/progression of multifocal airspace disease on CT scan and resultant hypoxia.   History of present illness   34 year old female who presented to the ER on 3/24 w/ cc: of exertional dyspnea over past week prior to presentation. No fever, dry mostly non-productive cough, no sick exposures. No chest pain, no swelling, no heart palpitations.  -had been treated for PNA Jan 21 w/ resolution of symptoms.  -she does have chickens. No longer cares for them; last cleaned hen house w/ regularity back in/around sept 2020. Onset of initial symptoms ~ Nov 2020 ER eval:  -Hypoxic required titration of oxygen up to 4 liters to get sats > 90% -COVID was neg, HIV Ab was neg back in Jan, PCT was neg, Ustrep was neg.  -CT chest showed: progression of bilateral multifocal airspace disease w/ LAN and small effusions.  -placed on CTX and azith. Admitted to floor PCCM asked to see for recurrent PNA   Past Medical History  Hypothyroidism Recurrent PNA  Significant Hospital Events   3/24 admitted w acute hypoxic respiratory failure   Consults:  PCCM   Procedures:    Significant Diagnostic Tests:  CT chest 3/24: progression of bilateral multifocal airspace disease w/ LAN and small effusions.  Micro Data:  Strep antigen 3/24: neg Legionella antigen 3/24>>> resp virus panel 3/24>>> SARS coronavirus 3/24: neg  Antimicrobials:  Rocephin 3/24>>> azithro 3/24>>>  Interim history/subjective:  No distress   Objective   Blood pressure 126/81, pulse 96, temperature 98.4 F (36.9 C), temperature source Oral, resp. rate 18, height '5\' 10"'  (1.778 m), weight 86.2 kg, last menstrual period 06/24/2019, SpO2 97 %.        Intake/Output  Summary (Last 24 hours) at 07/02/2019 1141 Last data filed at 07/02/2019 0120 Gross per 24 hour  Intake 303.8 ml  Output no documentation  Net 303.8 ml   Filed Weights   07/01/19 1948  Weight: 86.2 kg    Examination: General: 34 year old white female.  HENT: NCAT no JVD MMM  Lungs: crackles both bases  Cardiovascular: RRR  Abdomen: soft not tender  Extremities: warm and dry  Neuro: awake and oriented  GU: voids   Resolved Hospital Problem list     Assessment & Plan:  Acute hypoxic respiratory failure in setting of multifocal diffuse bilateral nodular pulmonary infiltrates.  -etiology unclear ? Fungal, has mold/mildew exposure in home and also had been caring for chickens up until first onset of illness just before Nov. Also consider atypical infection  Plan Cont abx for now  Sending resp virus culture, HSP panel, urine Histo, ESR Supplemental oxygen  Will need bronch   [place] to follow     Labs   CBC: Recent Labs  Lab 07/01/19 1954  WBC 13.9*  HGB 12.0  HCT 39.3  MCV 86.8  PLT 690*    Basic Metabolic Panel: Recent Labs  Lab 07/01/19 1954  NA 139  K 5.0  CL 104  CO2 26  GLUCOSE 88  BUN <5*  CREATININE 0.65  CALCIUM 9.0   GFR: Estimated Creatinine Clearance: 120.5 mL/min (by C-G formula based on SCr of 0.65 mg/dL). Recent Labs  Lab 07/01/19 1954 07/01/19 2123 07/01/19 2319 07/01/19 2324  PROCALCITON  --   --   --  <0.10  WBC 13.9*  --   --   --   LATICACIDVEN  --  1.6 1.4  --     Liver Function Tests: No results for input(s): AST, ALT, ALKPHOS, BILITOT, PROT, ALBUMIN in the last 168 hours. No results for input(s): LIPASE, AMYLASE in the last 168 hours. No results for input(s): AMMONIA in the last 168 hours.  ABG No results found for: PHART, PCO2ART, PO2ART, HCO3, TCO2, ACIDBASEDEF, O2SAT   Coagulation Profile: Recent Labs  Lab 07/01/19 2123  INR 1.2    Cardiac Enzymes: No results for input(s): CKTOTAL, CKMB, CKMBINDEX, TROPONINI  in the last 168 hours.  HbA1C: No results found for: HGBA1C  CBG: No results for input(s): GLUCAP in the last 168 hours.  Review of Systems:   Review of Systems  Constitutional: Positive for malaise/fatigue. Negative for fever and weight loss.  HENT: Negative.   Eyes: Negative.   Respiratory: Positive for cough, sputum production and shortness of breath.   Cardiovascular: Negative.   Gastrointestinal: Negative.   Genitourinary: Negative.   Musculoskeletal: Negative.   Skin: Negative.   Neurological: Negative.   Endo/Heme/Allergies: Negative.   Psychiatric/Behavioral: Negative.  Nervous/anxious: .jh.      Past Medical History  She,  has a past medical history of Pneumonia (05/08/2019).   Surgical History    Past Surgical History:  Procedure Laterality Date  . NO PAST SURGERIES    . TOOTH EXTRACTION       Social History   reports that she has never smoked. She has never used smokeless tobacco. She reports that she does not drink alcohol or use drugs.  Smokes marijuana about every other day. Does not vape Stays at home-->takes care of parents Does not have employment No travel  [place] in this area Hard wood floors Has chickens + mold/mildew exposure in bathroom Leak in roof but no standing water in or around home   Family History   Her family history includes Cancer in her maternal grandmother and paternal grandfather; Depression in her maternal uncle; Diabetes in her father and maternal uncle; Hyperlipidemia in her father and maternal grandmother; Hypertension in her father.   Allergies Allergies  Allergen Reactions  . Iron Other (See Comments)    Ferrosol - Liquid  Unknown reaction      Home Medications  Prior to Admission medications   Medication Sig Start Date End Date Taking? Authorizing Provider  acetaminophen (TYLENOL) 500 MG tablet Take 1,000 mg by mouth every 6 (six) hours as needed for moderate pain or headache.   Yes [provider]    guaifenesin (ROBITUSSIN) 100 MG/5ML syrup Take 200 mg by mouth 3 (three) times daily as needed for cough.   Yes [provider]  methimazole (TAPAZOLE) 5 MG tablet Take 1 tablet (5 mg total) by mouth 3 (three) times daily. 06/08/19  Yes Shamleffer, Melanie Crazier, MD  Multiple Vitamin (MULTIVITAMIN WITH MINERALS) TABS tablet Take 2 tablets by mouth daily. Gummies   Yes [provider]  OVER THE COUNTER MEDICATION Take 2 each by mouth daily as needed (anxiety). CBD gummies   Yes [provider]  propranolol (INDERAL) 40 MG tablet Take 1 tablet (40 mg total) by mouth 3 (three) times daily. 06/08/19  Yes Shamleffer, Melanie Crazier, MD     Critical care time: NA Erick Colace ACNP-BC [place] Pager # [phone] OR # [phone] if no answer

## 2019-07-03 ENCOUNTER — Inpatient Hospital Stay (HOSPITAL_COMMUNITY): Payer: Medicaid Other | Admitting: Certified Registered"

## 2019-07-03 ENCOUNTER — Encounter (HOSPITAL_COMMUNITY): Payer: Self-pay | Admitting: Internal Medicine

## 2019-07-03 ENCOUNTER — Other Ambulatory Visit: Payer: Self-pay

## 2019-07-03 ENCOUNTER — Inpatient Hospital Stay (HOSPITAL_COMMUNITY): Payer: Medicaid Other

## 2019-07-03 ENCOUNTER — Encounter (HOSPITAL_COMMUNITY)
Admission: EM | Disposition: A | Discharge status: Home / Self Care | Payer: Self-pay | Source: Home / Self Care | Attending: Student

## 2019-07-03 DIAGNOSIS — R918 Other nonspecific abnormal finding of lung field: Secondary | ICD-10-CM

## 2019-07-03 DIAGNOSIS — D649 Anemia, unspecified: Secondary | ICD-10-CM

## 2019-07-03 DIAGNOSIS — F1290 Cannabis use, unspecified, uncomplicated: Secondary | ICD-10-CM

## 2019-07-03 DIAGNOSIS — F419 Anxiety disorder, unspecified: Secondary | ICD-10-CM

## 2019-07-03 DIAGNOSIS — R9389 Abnormal findings on diagnostic imaging of other specified body structures: Secondary | ICD-10-CM

## 2019-07-03 HISTORY — PX: BIOPSY: SHX5522

## 2019-07-03 HISTORY — PX: VIDEO BRONCHOSCOPY: SHX5072

## 2019-07-03 HISTORY — PX: BRONCHIAL BRUSHINGS: SHX5108

## 2019-07-03 HISTORY — PX: BRONCHIAL WASHINGS: SHX5105

## 2019-07-03 LAB — RETICULOCYTES
Immature Retic Fract: 21.9 % — ABNORMAL HIGH (ref 2.3–15.9)
RBC.: 3.82 MIL/uL — ABNORMAL LOW (ref 3.87–5.11)
Retic Count, Absolute: 84 10*3/uL (ref 19.0–186.0)
Retic Ct Pct: 2.2 % (ref 0.4–3.1)

## 2019-07-03 LAB — CBC WITH DIFFERENTIAL/PLATELET
Abs Immature Granulocytes: 0.04 10*3/uL (ref 0.00–0.07)
Basophils Absolute: 0.1 10*3/uL (ref 0.0–0.1)
Basophils Relative: 1 %
Eosinophils Absolute: 0.2 10*3/uL (ref 0.0–0.5)
Eosinophils Relative: 2 %
HCT: 34.9 % — ABNORMAL LOW (ref 36.0–46.0)
Hemoglobin: 10.8 g/dL — ABNORMAL LOW (ref 12.0–15.0)
Immature Granulocytes: 0 %
Lymphocytes Relative: 30 %
Lymphs Abs: 3.5 10*3/uL (ref 0.7–4.0)
MCH: 26.7 pg (ref 26.0–34.0)
MCHC: 30.9 g/dL (ref 30.0–36.0)
MCV: 86.4 fL (ref 80.0–100.0)
Monocytes Absolute: 1 10*3/uL (ref 0.1–1.0)
Monocytes Relative: 8 %
Neutro Abs: 6.7 10*3/uL (ref 1.7–7.7)
Neutrophils Relative %: 59 %
Platelets: 511 10*3/uL — ABNORMAL HIGH (ref 150–400)
RBC: 4.04 MIL/uL (ref 3.87–5.11)
RDW: 16.4 % — ABNORMAL HIGH (ref 11.5–15.5)
WBC: 11.5 10*3/uL — ABNORMAL HIGH (ref 4.0–10.5)
nRBC: 0 % (ref 0.0–0.2)

## 2019-07-03 LAB — RENAL FUNCTION PANEL
Albumin: 2.4 g/dL — ABNORMAL LOW (ref 3.5–5.0)
Anion gap: 12 (ref 5–15)
BUN: 5 mg/dL — ABNORMAL LOW (ref 6–20)
CO2: 21 mmol/L — ABNORMAL LOW (ref 22–32)
Calcium: 8.5 mg/dL — ABNORMAL LOW (ref 8.9–10.3)
Chloride: 106 mmol/L (ref 98–111)
Creatinine, Ser: 0.55 mg/dL (ref 0.44–1.00)
GFR calc Af Amer: >60 mL/min (ref >60)
GFR calc non Af Amer: >60 mL/min (ref >60)
Glucose, Bld: 79 mg/dL (ref 70–99)
Phosphorus: 4.1 mg/dL (ref 2.5–4.6)
Potassium: 4 mmol/L (ref 3.5–5.1)
Sodium: 139 mmol/L (ref 135–145)

## 2019-07-03 LAB — IRON AND TIBC
Iron: 27 ug/dL — ABNORMAL LOW (ref 28–170)
Saturation Ratios: 15 % (ref 10.4–31.8)
TIBC: 186 ug/dL — ABNORMAL LOW (ref 250–450)
UIBC: 159 ug/dL

## 2019-07-03 LAB — SJOGRENS SYNDROME-B EXTRACTABLE NUCLEAR ANTIBODY: SSB (La) (ENA) Antibody, IgG: <0.2 AI (ref 0.0–0.9)

## 2019-07-03 LAB — RAPID URINE DRUG SCREEN, HOSP PERFORMED
Amphetamines: NOT DETECTED
Barbiturates: NOT DETECTED
Benzodiazepines: NOT DETECTED
Cocaine: NOT DETECTED
Opiates: NOT DETECTED
Tetrahydrocannabinol: NOT DETECTED

## 2019-07-03 LAB — CYCLIC CITRUL PEPTIDE ANTIBODY, IGG/IGA: CCP Antibodies IgG/IgA: 7 units (ref 0–19)

## 2019-07-03 LAB — ANTI-SCLERODERMA ANTIBODY: Scleroderma (Scl-70) (ENA) Antibody, IgG: <0.2 AI (ref 0.0–0.9)

## 2019-07-03 LAB — PROCALCITONIN: Procalcitonin: <0.1 ng/mL

## 2019-07-03 LAB — TSH: TSH: 6.99 u[IU]/mL — ABNORMAL HIGH (ref 0.350–4.500)

## 2019-07-03 LAB — FERRITIN: Ferritin: 226 ng/mL (ref 11–307)

## 2019-07-03 LAB — SJOGRENS SYNDROME-A EXTRACTABLE NUCLEAR ANTIBODY: SSA (Ro) (ENA) Antibody, IgG: <0.2 AI (ref 0.0–0.9)

## 2019-07-03 LAB — HISTONE ANTIBODIES, IGG, BLOOD: DNA-Histone: 0.5 Units (ref 0.0–0.9)

## 2019-07-03 LAB — ANTINUCLEAR ANTIBODIES, IFA: ANA Ab, IFA: NEGATIVE

## 2019-07-03 LAB — LEGIONELLA PNEUMOPHILA SEROGP 1 UR AG: L. pneumophila Serogp 1 Ur Ag: NEGATIVE

## 2019-07-03 LAB — VITAMIN B12: Vitamin B-12: 429 pg/mL (ref 180–914)

## 2019-07-03 LAB — FOLATE: Folate: 24.7 ng/mL (ref >5.9)

## 2019-07-03 SURGERY — BRONCHOSCOPY, WITH FLUOROSCOPY
Anesthesia: General

## 2019-07-03 MED ORDER — LIDOCAINE HCL (PF) 1 % IJ SOLN
INTRAMUSCULAR | Status: AC
  Filled 2019-07-03: qty 30

## 2019-07-03 MED ORDER — ONDANSETRON HCL 4 MG/2ML IJ SOLN
INTRAMUSCULAR | Status: DC | PRN
  Administered 2019-07-03: 4 mg via INTRAVENOUS

## 2019-07-03 MED ORDER — FENTANYL CITRATE (PF) 250 MCG/5ML IJ SOLN
INTRAMUSCULAR | Status: AC
  Filled 2019-07-03: qty 5

## 2019-07-03 MED ORDER — LIDOCAINE 2% (20 MG/ML) 5 ML SYRINGE
INTRAMUSCULAR | Status: DC | PRN
  Administered 2019-07-03: 100 mg via INTRAVENOUS

## 2019-07-03 MED ORDER — PROPOFOL 10 MG/ML IV BOLUS
INTRAVENOUS | Status: DC | PRN
  Administered 2019-07-03: 200 mg via INTRAVENOUS

## 2019-07-03 MED ORDER — MIDAZOLAM HCL 5 MG/5ML IJ SOLN
INTRAMUSCULAR | Status: DC | PRN
  Administered 2019-07-03: 2 mg via INTRAVENOUS

## 2019-07-03 MED ORDER — LIDOCAINE HCL (PF) 1 % IJ SOLN
INTRAMUSCULAR | Status: DC | PRN
  Administered 2019-07-03: 2 mL

## 2019-07-03 MED ORDER — PHENYLEPHRINE HCL-NACL 10-0.9 MG/250ML-% IV SOLN
INTRAVENOUS | Status: DC | PRN
  Administered 2019-07-03: 25 ug/min via INTRAVENOUS

## 2019-07-03 MED ORDER — SUCCINYLCHOLINE CHLORIDE 20 MG/ML IJ SOLN
INTRAMUSCULAR | Status: DC | PRN
  Administered 2019-07-03: 200 mg via INTRAVENOUS

## 2019-07-03 MED ORDER — LACTATED RINGERS IV SOLN
INTRAVENOUS | Status: DC
  Administered 2019-07-03: 14:00:00 1000 mL via INTRAVENOUS

## 2019-07-03 MED ORDER — MIDAZOLAM HCL 2 MG/2ML IJ SOLN
INTRAMUSCULAR | Status: AC
  Filled 2019-07-03: qty 2

## 2019-07-03 MED ORDER — FENTANYL CITRATE (PF) 100 MCG/2ML IJ SOLN
INTRAMUSCULAR | Status: DC | PRN
  Administered 2019-07-03: 50 ug via INTRAVENOUS

## 2019-07-03 NOTE — Transfer of Care (Signed)
Immediate Anesthesia Transfer of Care Note  Patient: Sarah Chandler  Procedure(s) Performed: VIDEO BRONCHOSCOPY WITH FLUORO (N/A ) BRONCHIAL BRUSHINGS BIOPSY HEMOSTASIS CONTROL BRONCHIAL WASHINGS  Patient Location: Endoscopy Unit  Anesthesia Type:General  Level of Consciousness: awake, alert  and oriented  Airway & Oxygen Therapy: Patient Spontanous Breathing and Patient connected to face mask oxygen  Post-op Assessment: Report given to RN and Post -op Vital signs reviewed and stable  Post vital signs: Reviewed and stable  Last Vitals:  Vitals Value Taken Time  BP 133/84 07/03/19 1503  Temp    Pulse 102 07/03/19 1511  Resp 21 07/03/19 1511  SpO2 91 % 07/03/19 1511  Vitals shown include unvalidated device data.  Last Pain:  Vitals:   07/03/19 1320  TempSrc: Oral  PainSc: 0-No pain         Complications: No apparent anesthesia complications

## 2019-07-03 NOTE — Anesthesia Procedure Notes (Addendum)
Procedure Name: Intubation Date/Time: 07/03/2019 2:20 PM Performed by: Alain Marion, CRNA Pre-anesthesia Checklist: Patient identified, Emergency Drugs available, Suction available and Patient being monitored Patient Re-evaluated:Patient Re-evaluated prior to induction Oxygen Delivery Method: Circle System Utilized Preoxygenation: Pre-oxygenation with 100% oxygen Induction Type: IV induction and Rapid sequence Ventilation: Mask ventilation without difficulty Laryngoscope Size: Mac and 4 Grade View: Grade I Tube type: Oral Tube size: 8.5 mm Number of attempts: 1 Airway Equipment and Method: Stylet and Oral airway Placement Confirmation: ETT inserted through vocal cords under direct vision,  positive ETCO2 and breath sounds checked- equal and bilateral Secured at: 22 cm Tube secured with: Tape Dental Injury: Teeth and Oropharynx as per pre-operative assessment

## 2019-07-03 NOTE — Anesthesia Postprocedure Evaluation (Signed)
Anesthesia Post Note  Patient: Sarah Chandler  Procedure(s) Performed: VIDEO BRONCHOSCOPY WITH FLUORO (N/A ) BRONCHIAL BRUSHINGS BIOPSY HEMOSTASIS CONTROL BRONCHIAL WASHINGS     Patient location during evaluation: PACU Anesthesia Type: General Level of consciousness: sedated and patient cooperative Pain management: pain level controlled Vital Signs Assessment: post-procedure vital signs reviewed and stable Respiratory status: spontaneous breathing Cardiovascular status: stable Anesthetic complications: no    Last Vitals:  Vitals:   07/03/19 1520 07/03/19 1624  BP:  114/72  Pulse: (!) 103 96  Resp: (!) 22 18  Temp:  36.7 C  SpO2: 92% 94%    Last Pain:  Vitals:   07/03/19 1520  TempSrc:   PainSc: 2                  Nolon Nations

## 2019-07-03 NOTE — Anesthesia Procedure Notes (Signed)
Procedure Name: LMA Insertion Date/Time: 07/03/2019 2:07 PM Performed by: Alain Marion, CRNA Pre-anesthesia Checklist: Patient identified, Emergency Drugs available, Suction available and Patient being monitored Patient Re-evaluated:Patient Re-evaluated prior to induction Oxygen Delivery Method: Circle System Utilized Preoxygenation: Pre-oxygenation with 100% oxygen Induction Type: IV induction Ventilation: Mask ventilation without difficulty LMA: LMA inserted LMA Size: 4.0 Number of attempts: 1 Airway Equipment and Method: Bite block Placement Confirmation: positive ETCO2 Tube secured with: Tape Dental Injury: Teeth and Oropharynx as per pre-operative assessment

## 2019-07-03 NOTE — Op Note (Signed)
Indication : Bilateral unexplained reticulonodular infiltrates in this never smoker.  Written informed consent was obtained prior to the procedure. The risks of the procedure including coughing, bleeding and the small chance of lung puncture requiring chest tube were discussed in great detail. The benefits & alternatives including serial follow up were also discussed.  General anesthesia was provided.  Bronchoscope was entered from the LMA due to coughing and desaturation, LMA was changed to ET tube.  ET tube was secured at 22 cm  Bronchoscope entered from the ETT  Trachea & bronchial tree examined to the subsegmental level. Mild amount of clear mucoid secretions were noted. No endobronchial lesions seen. BAL was obtained from both lower lobes.  Brushings x 2 & Trans bronchial biopsies x 3 were obtained from the RLL under fluoroscopy.  There was oxygen desaturation towards the end of the procedure which improved after withdrawing the ET tube to 22 cm   A CXR will be performed to r/o presence of pneumothorax.  Specimen sent -BAL for AFB, fungal, culture, cytology -Brushings for cytology -Transbronchial biopsies for surgical pathology  Sarah Chandler V.  [phone]

## 2019-07-03 NOTE — Progress Notes (Signed)
PROGRESS NOTE  Sarah Chandler EFE:[id number] DOB: [birth year]   PCP: Mellody Dance, DO  Patient is from: Home.  DOA: 07/01/2019 LOS: 2  Brief Narrative / Interim history: 34 year old female with history hyperthyroidism and recent hospitalization for pneumonia in January 2021 presenting with shortness of breath, productive cough dyspnea on exertion.  No fever, chills, chest pain or edema.  Denies smoking cigarettes or vaping but smokes marijuana.  No known sick contacts.  Used to have some chickens at home before his previous hospitalization.   In ED, HDS.  85% on RA requiring 4 L to recover.  WBC 14.  Platelets 690.  Otherwise, CBC and CMP without significant finding.  LA, protocol, UA, COVID-19, BNP and HS trop negative.  CTA chest negative for PE but progression of multifocal bilateral airspace disease concerning for atypical bacterial or fungal pneumonia, LAD with in bilateral axillary regions, mediastinum and upper abdomen.  Cultures obtained. Started on ceftriaxone and azithromycin for possible CAP.   PCCM consulted.  Plan for bronchoscopy  Subjective: Seen and examined earlier this morning.  No major events overnight or this morning.  Slept well after arthritis.  Breathing improved but is still on 3 L.  Reports coughing more.  Productive with whitish to yellowish phlegm.  She denies hemoptysis.  Denies GI or UTI symptoms.  Objective: Vitals:   07/02/19 1536 07/02/19 1900 07/02/19 2123 07/03/19 0800  BP:   114/61 (!) 104/58  Pulse:    72  Resp:   20 17  Temp: 98.2 F (36.8 C)  98 F (36.7 C) 97.9 F (36.6 C)  TempSrc: Oral  Oral   SpO2:  94% 94% 96%  Weight:      Height:       No intake or output data in the 24 hours ending 07/03/19 1129 Filed Weights   07/01/19 1948  Weight: 86.2 kg    Examination:  GENERAL: No acute distress.  Appears well.  HEENT: MMM.  Vision and hearing grossly intact.  NECK: Supple.  No apparent JVD.  RESP: 94% on 3 L.  No IWOB.  Bilateral  crackles globally. CVS:  RRR. Heart sounds normal.  ABD/GI/GU: Bowel sounds present. Soft. Non tender.  MSK/EXT:  Moves extremities. No apparent deformity. No edema.  SKIN: no apparent skin lesion or wound NEURO: Awake, alert and oriented appropriately.  No apparent focal neuro deficit. PSYCH: Calm. Normal affect.  Procedures:  None  Assessment & Plan: Acute respiratory failure with hypoxia due to multifocal pneumonia-patient with progressive SOB, cough and DOE.  CTA with progressive multifocal infiltrate and LADs but no fever and leukocytosis.  COVID-19, RVP, lactic acid, pro-Cal, blood cultures and cryptococcal antigen negative.  Reports smoking marijuana but no cigarette or IV drug use.  ESR 77. -PCCM recs-QuantiFERON gold, hypersensitivity panel, basic CTD serologies and bronchoscopy -Wean oxygen as able -Incentive spirometry  Multifocal pneumonia?-Infectious work-up not suggestive -Continue ceftriaxone and azithromycin pending bronchoscopy and sputum evaluation -Follow urine Legionella  Hyperthyroidism: TSH 6.9 -Continue propranolol and methimazole  Marijuana use-reports smoking marijuana. -Discouraged  Normocytic anemia: Baseline Hgb 10-12> 12 (admit)> 10.8.  Anemia panel suggests ACD. -Monitor intermittently  Anxiety -As needed hydroxyzine               DVT prophylaxis: SCD.  We will start subcu Lovenox after bronchoscopy. Code Status: Full code Family Communication: Patient and/or RN. Available if any question.   Discharge barrier: Evaluation and treatment for multifocal pneumonia.  Plan for bronchoscopy today. Patient is from: Home Final  disposition: Likely home in the next 48 to 72 hours  Consultants: PCCM   Microbiology summarized: COVID-19 negative. RVP panel negative Blood culture negative Urine culture-multiple species.  Sch Meds:  Scheduled Meds: . mouth rinse  15 mL Mouth Rinse BID  . methimazole  5 mg Oral TID  . propranolol  40 mg Oral  TID   Continuous Infusions: . azithromycin 500 mg (07/02/19 2139)  . cefTRIAXone (ROCEPHIN)  IV 2 g (07/02/19 2134)   PRN Meds:.acetaminophen **OR** acetaminophen, hydrOXYzine, ondansetron **OR** ondansetron (ZOFRAN) IV  Antimicrobials: Anti-infectives (From admission, onward)   Start     Dose/Rate Route Frequency Ordered Stop   07/01/19 2230  cefTRIAXone (ROCEPHIN) 2 g in sodium chloride 0.9 % 100 mL IVPB     2 g 200 mL/hr over 30 Minutes Intravenous Every 24 hours 07/01/19 2216     07/01/19 2230  azithromycin (ZITHROMAX) 500 mg in sodium chloride 0.9 % 250 mL IVPB     500 mg 250 mL/hr over 60 Minutes Intravenous Every 24 hours 07/01/19 2216         I have personally reviewed the following labs and images: CBC: Recent Labs  Lab 07/01/19 1954 07/02/19 1654 07/03/19 0217  WBC 13.9*  --  11.5*  NEUTROABS  --  6.8 6.7  HGB 12.0  --  10.8*  HCT 39.3  --  34.9*  MCV 86.8  --  86.4  PLT 690*  --  511*   BMP &GFR Recent Labs  Lab 07/01/19 1954 07/03/19 0217  NA 139 139  K 5.0 4.0  CL 104 106  CO2 26 21*  GLUCOSE 88 79  BUN <5* 5*  CREATININE 0.65 0.55  CALCIUM 9.0 8.5*  PHOS  --  4.1   Estimated Creatinine Clearance: 120.5 mL/min (by C-G formula based on SCr of 0.55 mg/dL). Liver & Pancreas: Recent Labs  Lab 07/03/19 0217  ALBUMIN 2.4*   No results for input(s): LIPASE, AMYLASE in the last 168 hours. No results for input(s): AMMONIA in the last 168 hours. Diabetic: No results for input(s): HGBA1C in the last 72 hours. No results for input(s): GLUCAP in the last 168 hours. Cardiac Enzymes: No results for input(s): CKTOTAL, CKMB, CKMBINDEX, TROPONINI in the last 168 hours. No results for input(s): PROBNP in the last 8760 hours. Coagulation Profile: Recent Labs  Lab 07/01/19 2123  INR 1.2   Thyroid Function Tests: Recent Labs    07/03/19 0217  TSH 6.990*   Lipid Profile: No results for input(s): CHOL, HDL, LDLCALC, TRIG, CHOLHDL, LDLDIRECT in the  last 72 hours. Anemia Panel: Recent Labs    07/03/19 0727  VITAMINB12 429  FOLATE 24.7  FERRITIN 226  TIBC 186*  IRON 27*  RETICCTPCT 2.2   Urine analysis:    Component Value Date/Time   COLORURINE STRAW (A) 07/01/2019 2324   APPEARANCEUR CLEAR 07/01/2019 2324   LABSPEC 1.017 07/01/2019 2324   PHURINE 6.0 07/01/2019 2324   GLUCOSEU NEGATIVE 07/01/2019 2324   HGBUR NEGATIVE 07/01/2019 2324   BILIRUBINUR NEGATIVE 07/01/2019 2324   KETONESUR NEGATIVE 07/01/2019 2324   PROTEINUR NEGATIVE 07/01/2019 2324   NITRITE NEGATIVE 07/01/2019 2324   LEUKOCYTESUR NEGATIVE 07/01/2019 2324   Sepsis Labs: Invalid input(s): PROCALCITONIN, [place]  Microbiology: Recent Results (from the past 240 hour(s))  Blood Culture (routine x 2)     Status: None (Preliminary result)   Collection Time: 07/01/19  9:19 PM   Specimen: BLOOD  Result Value Ref Range Status   Specimen Description  BLOOD RIGHT ANTECUBITAL  Final   Special Requests   Final    BOTTLES DRAWN AEROBIC AND ANAEROBIC Blood Culture adequate volume   Culture   Final    NO GROWTH 2 DAYS Performed at [facility], [address], [place], [place] [address]    Report Status PENDING  Incomplete  SARS CORONAVIRUS 2 (TAT 6-24 HRS) Nasopharyngeal Nasopharyngeal Swab     Status: None   Collection Time: 07/01/19  9:23 PM   Specimen: Nasopharyngeal Swab  Result Value Ref Range Status   SARS Coronavirus 2 NEGATIVE NEGATIVE Final    Comment: (NOTE) SARS-CoV-2 target nucleic acids are NOT DETECTED. The SARS-CoV-2 RNA is generally detectable in upper and lower respiratory specimens during the acute phase of infection. Negative results do not preclude SARS-CoV-2 infection, do not rule out co-infections with other pathogens, and should not be used as the sole basis for treatment or other patient management decisions. Negative results must be combined with clinical observations, patient history, and epidemiological information. The  expected result is Negative. Fact Sheet for Patients: SugarRoll.be Fact Sheet for Healthcare Providers: https://www.woods-mathews.com/ This test is not yet approved or cleared by the [place] FDA and  has been authorized for detection and/or diagnosis of SARS-CoV-2 by FDA under an Emergency Use Authorization (EUA). This EUA will remain  in effect (meaning this test can be used) for the duration of the COVID-19 declaration under Section 56 4(b)(1) of the Act, 21 U.S.C. section 360bbb-3(b)(1), unless the authorization is terminated or revoked sooner. Performed at [facility], [place] [address], [place], [place] [address]   Urine culture     Status: Abnormal   Collection Time: 07/01/19 11:24 PM   Specimen: In/Out Cath Urine  Result Value Ref Range Status   Specimen Description IN/OUT CATH URINE  Final   Special Requests   Final    NONE Performed at [facility], [place] [address] [place], [place] [address]    Culture MULTIPLE SPECIES PRESENT, SUGGEST RECOLLECTION (A)  Final   Report Status 07/02/2019 FINAL  Final  Blood Culture (routine x 2)     Status: None (Preliminary result)   Collection Time: 07/01/19 11:26 PM   Specimen: BLOOD RIGHT HAND  Result Value Ref Range Status   Specimen Description BLOOD RIGHT HAND  Final   Special Requests   Final    BOTTLES DRAWN AEROBIC AND ANAEROBIC Blood Culture adequate volume   Culture   Final    NO GROWTH 2 DAYS Performed at [facility], [place] [address] [place],  [address]    Report Status PENDING  Incomplete  Respiratory Panel by PCR     Status: None   Collection Time: 07/02/19 12:46 PM   Specimen: Nasopharyngeal Swab; Respiratory  Result Value Ref Range Status   Adenovirus NOT DETECTED NOT DETECTED Final   Coronavirus 229E NOT DETECTED NOT DETECTED Final    Comment: (NOTE) The Coronavirus on the Respiratory Panel, DOES NOT test for the novel  Coronavirus (2019  nCoV)    Coronavirus HKU1 NOT DETECTED NOT DETECTED Final   Coronavirus NL63 NOT DETECTED NOT DETECTED Final   Coronavirus OC43 NOT DETECTED NOT DETECTED Final   Metapneumovirus NOT DETECTED NOT DETECTED Final   Rhinovirus / Enterovirus NOT DETECTED NOT DETECTED Final   Influenza A NOT DETECTED NOT DETECTED Final   Influenza B NOT DETECTED NOT DETECTED Final   Parainfluenza Virus 1 NOT DETECTED NOT DETECTED Final   Parainfluenza Virus  2 NOT DETECTED NOT DETECTED Final   Parainfluenza Virus 3 NOT DETECTED NOT DETECTED Final   Parainfluenza Virus 4 NOT DETECTED NOT DETECTED Final   Respiratory Syncytial Virus NOT DETECTED NOT DETECTED Final   Bordetella pertussis NOT DETECTED NOT DETECTED Final   Chlamydophila pneumoniae NOT DETECTED NOT DETECTED Final   Mycoplasma pneumoniae NOT DETECTED NOT DETECTED Final    Comment: Performed at [facility], [place] [address] [place], [place] [address]    Radiology Studies: No results found.  25 minutes with more than 50% spent in reviewing records, counseling patient/family and coordinating care.   Taye T. [place]  If 7PM-7AM, please contact night-coverage [email or web address] Password [facility] 07/03/2019, 11:29 AM

## 2019-07-03 NOTE — Progress Notes (Signed)
Colette Ribas from ID notified this RN to remove droplet precautions. Patient resp panel and COVID is negative.

## 2019-07-03 NOTE — Anesthesia Preprocedure Evaluation (Addendum)
Anesthesia Evaluation  Patient identified by MRN, date of birth, ID band Patient awake    Reviewed: Allergy & Precautions, NPO status , Patient's Chart, lab work & pertinent test results  Airway Mallampati: II  TM Distance: >3 FB Neck ROM: Full    Dental  (+) Dental Advisory Given   Pulmonary shortness of breath, pneumonia,    Pulmonary exam normal breath sounds clear to auscultation       Cardiovascular negative cardio ROS Normal cardiovascular exam Rhythm:Regular Rate:Normal     Neuro/Psych PSYCHIATRIC DISORDERS Anxiety Depression negative neurological ROS     GI/Hepatic negative GI ROS, Neg liver ROS,   Endo/Other  Hyperthyroidism   Renal/GU negative Renal ROS     Musculoskeletal negative musculoskeletal ROS (+)   Abdominal   Peds  Hematology negative hematology ROS (+)   Anesthesia Other Findings   Reproductive/Obstetrics negative OB ROS                             Anesthesia Physical Anesthesia Plan  ASA: II  Anesthesia Plan: General   Post-op Pain Management:    Induction: Intravenous  PONV Risk Score and Plan: 2 and Treatment may vary due to age or medical condition, Ondansetron, Dexamethasone and Midazolam  Airway Management Planned: LMA  Additional Equipment: None  Intra-op Plan:   Post-operative Plan: Extubation in OR  Informed Consent: I have reviewed the patients History and Physical, chart, labs and discussed the procedure including the risks, benefits and alternatives for the proposed anesthesia with the patient or authorized representative who has indicated his/her understanding and acceptance.     Dental advisory given  Plan Discussed with: CRNA  Anesthesia Plan Comments:        Anesthesia Quick Evaluation

## 2019-07-03 NOTE — Progress Notes (Signed)
NAME:  Sarah Chandler, MRN:  [id number], DOB:  1985/07/12, LOS: 2 ADMISSION DATE:  07/01/2019, CONSULTATION DATE:  3/25 REFERRING MD:  Hal Hope, CHIEF COMPLAINT:  Respiratory failure w/ abnormal CT chest    Brief History   34 year old female admitted 3/24 w/ recurrent/progression of multifocal airspace disease on CT scan and resultant hypoxia.   History of present illness   34 year old female who presented to the ER on 3/24 w/ cc: of exertional dyspnea over past week prior to presentation. No fever, dry mostly non-productive cough, no sick exposures. No chest pain, no swelling, no heart palpitations.  -had been treated for PNA Jan 21 w/ resolution of symptoms.  -she does have chickens. No longer cares for them; last cleaned hen house w/ regularity back in/around sept 2020. Onset of initial symptoms ~ Nov 2020 ER eval:  -Hypoxic required titration of oxygen up to 4 liters to get sats > 90% -COVID was neg, HIV Ab was neg back in Jan, PCT was neg, Ustrep was neg.  -CT chest showed: progression of bilateral multifocal airspace disease w/ LAN and small effusions.  -placed on CTX and azith. Admitted to floor PCCM asked to see for recurrent PNA   Past Medical History  Hyperthyroidism Recurrent PNA  Significant Hospital Events   3/24 admitted w acute hypoxic respiratory failure   Consults:  PCCM   Procedures:    Significant Diagnostic Tests:  CT chest 3/24: progression of bilateral multifocal airspace disease w/ LAN and small effusions.  Micro Data:  Strep antigen 3/24: neg Legionella antigen 3/24>>> resp virus panel 3/24>>>neg SARS coronavirus 3/24: neg BC 3/24 >> neg Crypto antigen negative  Antimicrobials:  Rocephin 3/24>>> azithro 3/24>>>  Interim history/subjective:   Afebrile Anxious about procedure Denies chest pain or dyspnea, starting to cough some sputum but swallows has not observed color On 3 L nasal cannula  Objective   Blood pressure (!) 104/58, pulse  72, temperature 97.9 F (36.6 C), resp. rate 17, height 5\' 10"  (1.778 m), weight 86.2 kg, last menstrual period 06/24/2019, SpO2 96 %.       No intake or output data in the 24 hours ending 07/03/19 1036 Filed Weights   07/01/19 1948  Weight: 86.2 kg    Examination: General: Young woman, sitting up in bed, flat affect, no distress HENT: NCAT no JVD MMM, mild pallor, no icterus, posterior chain left lymph node nontender 1 cm Lungs: Dry crackles both bases  Cardiovascular: RRR  Abdomen: soft not tender  Extremities: warm and dry  Neuro: awake and oriented , nonfocal  Chest x-ray personally reviewed which shows worsening bibasilar infiltrates. CT angiogram reviewed  Resolved Hospital Problem list     Assessment & Plan:  Acute hypoxic respiratory failure in setting of multifocal diffuse bilateral nodular pulmonary infiltrates.  -etiology unclear - has mold/mildew exposure in home and also had been caring for chickens up until first onset of illness just before Nov.  HIV negative 04/2019 Doubt bacterial process, no specific exposure for TB , not immunocompromised-no specific blasto exposure Inflammatory -await collagen-vascular panel, ANCA, sarcoidosis possible Does have axillary and neck lymphadenopathy  Plan Cont abx for now  Await HSP panel, urine Histo Supplemental oxygen  The various options of biopsy including bronchoscopy, CT guided needle aspiration and surgical biopsy were discussed.The risks of each procedure including coughing, bleeding and the  chances of lung puncture requiring chest tube were discussed in great detail. The benefits & alternatives including serial follow up were  also discussed. We will proceed with bronchoscopy and transbronchial biopsy today.  All her questions about the procedure were answered  -Other alternative biopsy sites would include axillary lymph node biopsy or surgical lung biopsy  Kara Mead MD. FCCP. [facility]  If no response to pager , please call [phone]   07/03/2019

## 2019-07-04 DIAGNOSIS — R918 Other nonspecific abnormal finding of lung field: Secondary | ICD-10-CM

## 2019-07-04 LAB — ACID FAST SMEAR (AFB, MYCOBACTERIA): Acid Fast Smear: NEGATIVE

## 2019-07-04 LAB — PROCALCITONIN: Procalcitonin: <0.1 ng/mL

## 2019-07-04 MED ORDER — AZITHROMYCIN 250 MG PO TABS
500.0000 mg | ORAL_TABLET | Freq: Every day | ORAL | Status: AC
  Administered 2019-07-04 – 2019-07-05 (×2): 500 mg via ORAL
  Filled 2019-07-04 (×2): qty 2

## 2019-07-04 NOTE — Progress Notes (Signed)
PROGRESS NOTE  Sarah Chandler DOB: 05/01/1985   PCP: Mellody Dance, DO  Patient is from: Home.  DOA: 07/01/2019 LOS: 3  Brief Narrative / Interim history: 34 year old female with history hyperthyroidism and recent hospitalization for pneumonia in January 2021 presenting with shortness of breath, productive cough dyspnea on exertion.  No fever, chills, chest pain or edema.  Denies smoking cigarettes or vaping but smokes marijuana.  No known sick contacts.  Used to have some chickens at home before his previous hospitalization.   In ED, HDS.  85% on RA requiring 4 L to recover.  WBC 14.  Platelets 690.  Otherwise, CBC and CMP without significant finding.  LA, protocol, UA, COVID-19, BNP and HS trop negative.  CTA chest negative for PE but progression of multifocal bilateral airspace disease concerning for atypical bacterial or fungal pneumonia, LAD with in bilateral axillary regions, mediastinum and upper abdomen.  Cultures obtained. Started on ceftriaxone and azithromycin for possible CAP.   PCCM consulted.  Infectious and autoimmune work-up nonrevealing except for elevated ESR.  She underwent bronchoscopy and RLL tracheobronchial biopsy on 07/03/2019.  QuantiFERON gold, AFB and fungal cultures pending.  Now concern about possible lymphoma.   Subjective: Seen and examined earlier this morning.  No major events overnight or this morning.  Breathing and cough about the same.  Remains on 3 L by nasal cannula.  She denies hemoptysis, GI or UTI symptoms.  Objective: Vitals:   07/03/19 1624 07/03/19 2146 07/03/19 2147 07/04/19 0752  BP: 114/72 107/69 107/69 117/65  Pulse: 96 90 90 85  Resp: '18 18  16  ' Temp: 98.1 F (36.7 C) 98.5 F (36.9 C)  (!) 97.4 F (36.3 C)  TempSrc:  Oral    SpO2: 94% 97%  96%  Weight:      Height:        Intake/Output Summary (Last 24 hours) at 07/04/2019 1223 Last data filed at 07/03/2019 1455 Gross per 24 hour  Intake 600 ml  Output --  Net  600 ml   Filed Weights   07/01/19 1948 07/03/19 1320  Weight: 86.2 kg 86.2 kg    Examination:  GENERAL: No acute distress.  Appears well.  HEENT: MMM.  Vision and hearing grossly intact.  NECK: Supple.  Left posterior cervical LAD RESP: 97% on 3 L by [place].  No IWOB.  Bilateral crackles globally. CVS:  RRR. Heart sounds normal.  ABD/GI/GU: Bowel sounds present. Soft. Non tender.  MSK/EXT:  Moves extremities. No apparent deformity. No edema.  SKIN: no apparent skin lesion or wound NEURO: Awake, alert and oriented appropriately.  No apparent focal neuro deficit. PSYCH: Calm. Normal affect.   Procedures:  None  Assessment & Plan: Acute respiratory failure with hypoxia in the setting of progressive multifocal diffuse bilateral nodular pulmonary infiltrates with adenopathy as noted on CTA chest-patient with progressive SOB, cough and DOE.  Infectious work-up including COVID-19, RVP, LA, pro-Cal, blood and respiratory cultures and cryptococcal antigen negative.  Reports smoking marijuana but no cigarette or IV drug use.  ESR 77. -Appreciate PCCM guidance and help. -Personally asked RN to give her incentive spirometry and wean oxygen as able -Continue ceftriaxone and azithromycin for empiric coverage for CAP-although less likely at this point  Abnormal CT chest: As above.  Hyperthyroidism: TSH 6.9 -Continue propranolol and methimazole  Marijuana use-reports smoking marijuana. -Discouraged  Normocytic anemia: Baseline Hgb 10-12> 12 (admit)> 10.8.  Anemia panel suggests ACD. -Monitor intermittently  Anxiety -As needed hydroxyzine  DVT prophylaxis: SCD.  We will start subcu Lovenox after bronchoscopy. Code Status: Full code Family Communication: Patient and/or RN. Available if any question.   Discharge barrier: Evaluation and treatment for acute respiratory failure with progressive lung infiltrate.  She may need axillary lymph node excisional biopsy and  potential VATS Patient is from: Home Final disposition: Likely home may be after 72 hours  Consultants: PCCM   Microbiology summarized: COVID-19 negative. RVP panel negative Blood culture negative Urine culture-multiple species. Respiratory culture negative AFB and AFC pending. Respiratory fungal culture pending. QuantiFERON gold pending.  Sch Meds:  Scheduled Meds: . mouth rinse  15 mL Mouth Rinse BID  . methimazole  5 mg Oral TID  . propranolol  40 mg Oral TID   Continuous Infusions: . azithromycin Stopped (07/03/19 2300)  . cefTRIAXone (ROCEPHIN)  IV Stopped (07/03/19 2300)  . lactated ringers Stopped (07/03/19 1900)   PRN Meds:.acetaminophen **OR** acetaminophen, hydrOXYzine, ondansetron **OR** ondansetron (ZOFRAN) IV  Antimicrobials: Anti-infectives (From admission, onward)   Start     Dose/Rate Route Frequency Ordered Stop   07/01/19 2230  cefTRIAXone (ROCEPHIN) 2 g in sodium chloride 0.9 % 100 mL IVPB     2 g 200 mL/hr over 30 Minutes Intravenous Every 24 hours 07/01/19 2216     07/01/19 2230  azithromycin (ZITHROMAX) 500 mg in sodium chloride 0.9 % 250 mL IVPB     500 mg 250 mL/hr over 60 Minutes Intravenous Every 24 hours 07/01/19 2216         I have personally reviewed the following labs and images: CBC: Recent Labs  Lab 07/01/19 1954 07/02/19 1654 07/03/19 0217  WBC 13.9*  --  11.5*  NEUTROABS  --  6.8 6.7  HGB 12.0  --  10.8*  HCT 39.3  --  34.9*  MCV 86.8  --  86.4  PLT 690*  --  511*   BMP &GFR Recent Labs  Lab 07/01/19 1954 07/03/19 0217  NA 139 139  K 5.0 4.0  CL 104 106  CO2 26 21*  GLUCOSE 88 79  BUN <5* 5*  CREATININE 0.65 0.55  CALCIUM 9.0 8.5*  PHOS  --  4.1   Estimated Creatinine Clearance: 120.5 mL/min (by C-G formula based on SCr of 0.55 mg/dL). Liver & Pancreas: Recent Labs  Lab 07/03/19 0217  ALBUMIN 2.4*   No results for input(s): LIPASE, AMYLASE in the last 168 hours. No results for input(s): AMMONIA in the  last 168 hours. Diabetic: No results for input(s): HGBA1C in the last 72 hours. No results for input(s): GLUCAP in the last 168 hours. Cardiac Enzymes: No results for input(s): CKTOTAL, CKMB, CKMBINDEX, TROPONINI in the last 168 hours. No results for input(s): PROBNP in the last 8760 hours. Coagulation Profile: Recent Labs  Lab 07/01/19 2123  INR 1.2   Thyroid Function Tests: Recent Labs    07/03/19 0217  TSH 6.990*   Lipid Profile: No results for input(s): CHOL, HDL, LDLCALC, TRIG, CHOLHDL, LDLDIRECT in the last 72 hours. Anemia Panel: Recent Labs    07/03/19 0727  VITAMINB12 429  FOLATE 24.7  FERRITIN 226  TIBC 186*  IRON 27*  RETICCTPCT 2.2   Urine analysis:    Component Value Date/Time   COLORURINE STRAW (A) 07/01/2019 2324   APPEARANCEUR CLEAR 07/01/2019 2324   LABSPEC 1.017 07/01/2019 2324   PHURINE 6.0 07/01/2019 2324   GLUCOSEU NEGATIVE 07/01/2019 2324   HGBUR NEGATIVE 07/01/2019 2324   BILIRUBINUR NEGATIVE 07/01/2019 2324   KETONESUR NEGATIVE  07/01/2019 2324   PROTEINUR NEGATIVE 07/01/2019 2324   NITRITE NEGATIVE 07/01/2019 2324   LEUKOCYTESUR NEGATIVE 07/01/2019 2324   Sepsis Labs: Invalid input(s): PROCALCITONIN, [place]  Microbiology: Recent Results (from the past 240 hour(s))  Blood Culture (routine x 2)     Status: None (Preliminary result)   Collection Time: 07/01/19  9:19 PM   Specimen: BLOOD  Result Value Ref Range Status   Specimen Description BLOOD RIGHT ANTECUBITAL  Final   Special Requests   Final    BOTTLES DRAWN AEROBIC AND ANAEROBIC Blood Culture adequate volume   Culture   Final    NO GROWTH 2 DAYS Performed at [facility], [address], [place], [place] [address]    Report Status PENDING  Incomplete  SARS CORONAVIRUS 2 (TAT 6-24 HRS) Nasopharyngeal Nasopharyngeal Swab     Status: None   Collection Time: 07/01/19  9:23 PM   Specimen: Nasopharyngeal Swab  Result Value Ref Range Status   SARS Coronavirus 2  NEGATIVE NEGATIVE Final    Comment: (NOTE) SARS-CoV-2 target nucleic acids are NOT DETECTED. The SARS-CoV-2 RNA is generally detectable in upper and lower respiratory specimens during the acute phase of infection. Negative results do not preclude SARS-CoV-2 infection, do not rule out co-infections with other pathogens, and should not be used as the sole basis for treatment or other patient management decisions. Negative results must be combined with clinical observations, patient history, and epidemiological information. The expected result is Negative. Fact Sheet for Patients: SugarRoll.be Fact Sheet for Healthcare Providers: https://www.woods-mathews.com/ This test is not yet approved or cleared by the [place] FDA and  has been authorized for detection and/or diagnosis of SARS-CoV-2 by FDA under an Emergency Use Authorization (EUA). This EUA will remain  in effect (meaning this test can be used) for the duration of the COVID-19 declaration under Section 56 4(b)(1) of the Act, 21 U.S.C. section 360bbb-3(b)(1), unless the authorization is terminated or revoked sooner. Performed at [facility], [place] [address], [place], [place] [address]   Urine culture     Status: Abnormal   Collection Time: 07/01/19 11:24 PM   Specimen: In/Out Cath Urine  Result Value Ref Range Status   Specimen Description IN/OUT CATH URINE  Final   Special Requests   Final    NONE Performed at [facility], [place] [address], [place], [place] [address]    Culture MULTIPLE SPECIES PRESENT, SUGGEST RECOLLECTION (A)  Final   Report Status 07/02/2019 FINAL  Final  Blood Culture (routine x 2)     Status: None (Preliminary result)   Collection Time: 07/01/19 11:26 PM   Specimen: BLOOD RIGHT HAND  Result Value Ref Range Status   Specimen Description BLOOD RIGHT HAND  Final   Special Requests   Final    BOTTLES DRAWN AEROBIC AND ANAEROBIC Blood Culture  adequate volume   Culture   Final    NO GROWTH 2 DAYS Performed at [facility], [place] [address], [place], [place] [address]    Report Status PENDING  Incomplete  Respiratory Panel by PCR     Status: None   Collection Time: 07/02/19 12:46 PM   Specimen: Nasopharyngeal Swab; Respiratory  Result Value Ref Range Status   Adenovirus NOT DETECTED NOT DETECTED Final   Coronavirus 229E NOT DETECTED NOT DETECTED Final    Comment: (NOTE) The Coronavirus on the Respiratory Panel, DOES NOT test for the novel  Coronavirus (2019 nCoV)    Coronavirus HKU1  NOT DETECTED NOT DETECTED Final   Coronavirus NL63 NOT DETECTED NOT DETECTED Final   Coronavirus OC43 NOT DETECTED NOT DETECTED Final   Metapneumovirus NOT DETECTED NOT DETECTED Final   Rhinovirus / Enterovirus NOT DETECTED NOT DETECTED Final   Influenza A NOT DETECTED NOT DETECTED Final   Influenza B NOT DETECTED NOT DETECTED Final   Parainfluenza Virus 1 NOT DETECTED NOT DETECTED Final   Parainfluenza Virus 2 NOT DETECTED NOT DETECTED Final   Parainfluenza Virus 3 NOT DETECTED NOT DETECTED Final   Parainfluenza Virus 4 NOT DETECTED NOT DETECTED Final   Respiratory Syncytial Virus NOT DETECTED NOT DETECTED Final   Bordetella pertussis NOT DETECTED NOT DETECTED Final   Chlamydophila pneumoniae NOT DETECTED NOT DETECTED Final   Mycoplasma pneumoniae NOT DETECTED NOT DETECTED Final    Comment: Performed at [facility], [place] [address], [place], [place] [address]  Culture, respiratory     Status: None (Preliminary result)   Collection Time: 07/03/19  2:43 PM   Specimen: Bronchial Alveolar Lavage; Respiratory  Result Value Ref Range Status   Specimen Description BRONCHIAL ALVEOLAR LAVAGE  Final   Special Requests NONE  Final   Gram Stain   Final    MODERATE WBC PRESENT, PREDOMINANTLY MONONUCLEAR NO ORGANISMS SEEN    Culture   Final    NO GROWTH 1 DAY Performed at [facility], [address] [place], [place] [address]      Report Status PENDING  Incomplete    Radiology Studies: DG CHEST PORT 1 VIEW  Result Date: 07/03/2019 CLINICAL DATA:  Status post bronchoscopy for respiratory failure. EXAM: PORTABLE CHEST 1 VIEW COMPARISON:  July 01 2019 FINDINGS: The mediastinal contour and cardiac silhouette are stable. Bibasilar infiltrates are noted without interval change. Left suprahilar opacity is identified unchanged. There is no pleural line to suggest pneumothorax. The bony structures are stable. IMPRESSION: 1. No pneumothorax. 2. Bibasilar infiltrates unchanged. Left suprahilar opacity unchanged. Electronically Signed   By: Abelardo Diesel M.D.   On: 07/03/2019 15:18   DG C-ARM BRONCHOSCOPY  Result Date: 07/03/2019 C-ARM BRONCHOSCOPY: Fluoroscopy was utilized by the requesting physician.  No radiographic interpretation.    Jobin Montelongo T. Newberry  If 7PM-7AM, please contact night-coverage [email or web address] Password [facility] 07/04/2019, 12:23 PM

## 2019-07-04 NOTE — Plan of Care (Signed)

## 2019-07-04 NOTE — Progress Notes (Signed)
   NAME:  EDIE VALLANDINGHAM, MRN:  [id number], DOB:  1985-05-10, LOS: 3 ADMISSION DATE:  07/01/2019, CONSULTATION DATE:  3/25 REFERRING MD:  Hal Hope, CHIEF COMPLAINT:  Respiratory failure w/ abnormal CT chest    Brief History   34 year old female admitted 3/24 w/ recurrent/progression of multifocal airspace disease on CT scan and resultant hypoxia.   History of present illness   34 year old female who presented to the ER on 3/24 w/ cc: of exertional dyspnea over past week prior to presentation. No fever, dry mostly non-productive cough, no sick exposures. No chest pain, no swelling, no heart palpitations.  -had been treated for PNA Jan 21 w/ resolution of symptoms.  -she does have chickens. No longer cares for them; last cleaned hen house w/ regularity back in/around sept 2020. Onset of initial symptoms ~ Nov 2020 ER eval:  -Hypoxic required titration of oxygen up to 4 liters to get sats > 90% -COVID was neg, HIV Ab was neg back in Jan, PCT was neg, Ustrep was neg.  -CT chest showed: progression of bilateral multifocal airspace disease w/ LAN and small effusions.  -placed on CTX and azith. Admitted to floor PCCM asked to see for recurrent PNA   Past Medical History  Hyperthyroidism Recurrent PNA  Significant Hospital Events   3/24 admitted w acute hypoxic respiratory failure   Consults:  PCCM   Procedures:    Significant Diagnostic Tests:  CT chest 3/24: progression of bilateral multifocal airspace disease w/ LAN and small effusions.  Micro Data:  Strep antigen 3/24: neg Legionella antigen 3/24>>> resp virus panel 3/24>>>neg SARS coronavirus 3/24: neg BC 3/24 >> neg Crypto antigen negative  Antimicrobials:  Rocephin 3/24>>> azithro 3/24>>>  Interim history/subjective:   No events. Coughed up a little bit of blood self-limited. No rashes.  Objective   Blood pressure 117/65, pulse 85, temperature (!) 97.4 F (36.3 C), resp. rate 16, height 5\' 10"  (1.778 m), weight  86.2 kg, last menstrual period 06/24/2019, SpO2 96 %.        Intake/Output Summary (Last 24 hours) at 07/04/2019 0830 Last data filed at 07/03/2019 1455 Gross per 24 hour  Intake 600 ml  Output --  Net 600 ml   Filed Weights   07/01/19 1948 07/03/19 1320  Weight: 86.2 kg 86.2 kg    Examination: GEN: chronically ill appearing young woman lying in bed HEENT: MMM, no thrush CV:RRR, ext warm, has R>L edema PULM: Diminished at bases, no wheezing or accessory muscle use GI: Soft, +BS EXT: R>L nonpitting edema NEURO: Moves all 4 ext to command PSYCH: RASS 0 SKIN: Pale   Resolved Hospital Problem list     Assessment & Plan:  Acute hypoxic respiratory failure in setting of multifocal diffuse bilateral nodular pulmonary infiltrates with adenopathy.   Rheum and ID workup unremarkable to date other than nonspecific inflammatory marker elevation.  S/p bronch and RLL Tbbx on 07/03/19.  I am a bit worried about lymphoma although HSP in differential. - Await bronch culture and path data - Fine for course of abx for CAP for now - Wean O2 to maintain sats > 90% - If these biopsies are unrevealing I think we need to move to axillary lymph node excisional biopsy and potentially VATS - Will see again Monday as there will be no new data tomorrow to guide therapy  Erskine Emery MD PCCM

## 2019-07-04 NOTE — Progress Notes (Signed)
Pt was started on ceftriaxone and azith to r/o PNA. She has received 3d of therapy now. Suspicion for lymphoma instead now. Ok to add 5d stop date for abx per Dr. Cyndia Skeeters.   Ceftriaxone 2g IV q24 x 2 more doses Change azithromycin to 500mg  PO qday x 2 more doses  Onnie Boer, PharmD, BCIDP, AAHIVP, CPP Infectious Disease Pharmacist 07/04/2019 1:20 PM

## 2019-07-05 LAB — QUANTIFERON-TB GOLD PLUS: QuantiFERON-TB Gold Plus: NEGATIVE

## 2019-07-05 LAB — QUANTIFERON-TB GOLD PLUS (RQFGPL)
QuantiFERON Mitogen Value: 7.23 IU/mL
QuantiFERON Nil Value: 0.03 IU/mL
QuantiFERON TB1 Ag Value: 0.04 IU/mL
QuantiFERON TB2 Ag Value: 0.03 IU/mL

## 2019-07-05 LAB — CULTURE, RESPIRATORY W GRAM STAIN: Culture: NO GROWTH

## 2019-07-05 NOTE — Progress Notes (Signed)
PROGRESS NOTE  Sarah Chandler CHE:[id number] DOB: January 05, 1986   PCP: Mellody Dance, DO  Patient is from: Home.  DOA: 07/01/2019 LOS: 4  Brief Narrative / Interim history: 34 year old female with history hyperthyroidism and recent hospitalization for pneumonia in January 2021 presenting with shortness of breath, productive cough dyspnea on exertion.  No fever, chills, chest pain or edema.  Denies smoking cigarettes or vaping but smokes marijuana.  No known sick contacts.  Used to have some chickens at home before his previous hospitalization.   In ED, HDS.  85% on RA requiring 4 L to recover.  WBC 14.  Platelets 690.  Otherwise, CBC and CMP without significant finding.  LA, protocol, UA, COVID-19, BNP and HS trop negative.  CTA chest negative for PE but progression of multifocal bilateral airspace disease concerning for atypical bacterial or fungal pneumonia, LAD with in bilateral axillary regions, mediastinum and upper abdomen.  Cultures obtained. Started on ceftriaxone and azithromycin for possible CAP.   PCCM consulted.  Infectious and autoimmune work-up nonrevealing except for elevated ESR.  She underwent bronchoscopy and RLL tracheobronchial biopsy on 07/03/2019.  QuantiFERON gold, AFB and fungal cultures pending.  Now concern about possible lymphoma.   Subjective: No complaints No shortness of breath Patient is awaiting biopsy result to rule out lymphoma.  Objective: Vitals:   07/05/19 0000 07/05/19 0005 07/05/19 0738 07/05/19 0914  BP:   114/70 121/72  Pulse:   82 88  Resp:   16   Temp:   97.9 F (36.6 C)   TempSrc:      SpO2: (!) 84% 96% 94%   Weight:      Height:        Intake/Output Summary (Last 24 hours) at 07/05/2019 1455 Last data filed at 07/04/2019 2149 Gross per 24 hour  Intake 190.93 ml  Output --  Net 190.93 ml   Filed Weights   07/01/19 1948 07/03/19 1320  Weight: 86.2 kg 86.2 kg    Examination:  GENERAL: No acute distress.  Appears well.  HEENT:  No pallor or jaundice. NECK: Supple.  No JVD RESP: Decreased air entry globally CVS: S1-S2 ABD/GI/GU: Obese, soft and nontender.  Cancer not palpable. MSK/EXT: Fullness of the ankle but no swelling. NEURO: Awake, alert and oriented appropriately.  Patient moves all extremities.  Procedures:  None  Assessment & Plan: Acute respiratory failure with hypoxia in the setting of progressive multifocal diffuse bilateral nodular pulmonary infiltrates with adenopathy as noted on CTA chest-patient with progressive SOB, cough and DOE.  Infectious work-up including COVID-19, RVP, LA, pro-Cal, blood and respiratory cultures and cryptococcal antigen negative.  Reports smoking marijuana but no cigarette or IV drug use.  ESR 77. -Appreciate PCCM guidance and help. -Personally asked RN to give her incentive spirometry and wean oxygen as able -Continue ceftriaxone and azithromycin for empiric coverage for CAP-although less likely at this point 07/05/2019: We will have a low threshold to discontinue antibiotics.  Follow biopsy result.  Abnormal CT chest: As above.  Hyperthyroidism: TSH 6.9 -Continue propranolol and methimazole  Marijuana use-reports smoking marijuana. -Discouraged  Normocytic anemia: Baseline Hgb 10-12> 12 (admit)> 10.8.  Anemia panel suggests ACD. -Monitor intermittently  Anxiety -As needed hydroxyzine   DVT prophylaxis: SCD.   Code Status: Full code Family Communication:   Discharge barrier: Evaluation and treatment for acute respiratory failure with progressive lung infiltrate.  She may need axillary lymph node excisional biopsy and potential VATS Patient is from: Home Final disposition: Likely home may be after  72 hours  Consultants: PCCM   Microbiology summarized: [id number] negative. RVP panel negative Blood culture negative Urine culture-multiple species. Respiratory culture negative AFB and AFC pending. Respiratory fungal culture pending. QuantiFERON gold  pending.  Sch Meds:  Scheduled Meds: . azithromycin  500 mg Oral QHS  . mouth rinse  15 mL Mouth Rinse BID  . methimazole  5 mg Oral TID  . propranolol  40 mg Oral TID   Continuous Infusions: . cefTRIAXone (ROCEPHIN)  IV 2 g (07/04/19 2149)  . lactated ringers Stopped (07/03/19 1900)   PRN Meds:.acetaminophen **OR** acetaminophen, hydrOXYzine, ondansetron **OR** ondansetron (ZOFRAN) IV  Antimicrobials: Anti-infectives (From admission, onward)   Start     Dose/Rate Route Frequency Ordered Stop   07/04/19 2200  azithromycin (ZITHROMAX) tablet 500 mg     500 mg Oral Daily at bedtime 07/04/19 1317 07/06/19 2159   07/01/19 2230  cefTRIAXone (ROCEPHIN) 2 g in sodium chloride 0.9 % 100 mL IVPB     2 g 200 mL/hr over 30 Minutes Intravenous Every 24 hours 07/01/19 2216 07/06/19 2229   07/01/19 2230  azithromycin (ZITHROMAX) 500 mg in sodium chloride 0.9 % 250 mL IVPB  Status:  Discontinued     500 mg 250 mL/hr over 60 Minutes Intravenous Every 24 hours 07/01/19 2216 07/04/19 1317       I have personally reviewed the following labs and images: CBC: Recent Labs  Lab 07/01/19 1954 07/02/19 1654 07/03/19 0217  WBC 13.9*  --  11.5*  NEUTROABS  --  6.8 6.7  HGB 12.0  --  10.8*  HCT 39.3  --  34.9*  MCV 86.8  --  86.4  PLT 690*  --  511*   BMP &GFR Recent Labs  Lab 07/01/19 1954 07/03/19 0217  NA 139 139  K 5.0 4.0  CL 104 106  CO2 26 21*  GLUCOSE 88 79  BUN <5* 5*  CREATININE 0.65 0.55  CALCIUM 9.0 8.5*  PHOS  --  4.1   Estimated Creatinine Clearance: 120.5 mL/min (by C-G formula based on SCr of 0.55 mg/dL). Liver & Pancreas: Recent Labs  Lab 07/03/19 0217  ALBUMIN 2.4*   No results for input(s): LIPASE, AMYLASE in the last 168 hours. No results for input(s): AMMONIA in the last 168 hours. Diabetic: No results for input(s): HGBA1C in the last 72 hours. No results for input(s): GLUCAP in the last 168 hours. Cardiac Enzymes: No results for input(s): CKTOTAL,  CKMB, CKMBINDEX, TROPONINI in the last 168 hours. No results for input(s): PROBNP in the last 8760 hours. Coagulation Profile: Recent Labs  Lab 07/01/19 2123  INR 1.2   Thyroid Function Tests: Recent Labs    07/03/19 0217  TSH 6.990*   Lipid Profile: No results for input(s): CHOL, HDL, LDLCALC, TRIG, CHOLHDL, LDLDIRECT in the last 72 hours. Anemia Panel: Recent Labs    07/03/19 0727  VITAMINB12 429  FOLATE 24.7  FERRITIN 226  TIBC 186*  IRON 27*  RETICCTPCT 2.2   Urine analysis:    Component Value Date/Time   COLORURINE STRAW (A) 07/01/2019 2324   APPEARANCEUR CLEAR 07/01/2019 2324   LABSPEC 1.017 07/01/2019 2324   PHURINE 6.0 07/01/2019 2324   GLUCOSEU NEGATIVE 07/01/2019 2324   HGBUR NEGATIVE 07/01/2019 2324   BILIRUBINUR NEGATIVE 07/01/2019 2324   KETONESUR NEGATIVE 07/01/2019 2324   PROTEINUR NEGATIVE 07/01/2019 2324   NITRITE NEGATIVE 07/01/2019 2324   LEUKOCYTESUR NEGATIVE 07/01/2019 2324   Sepsis Labs: Invalid input(s): PROCALCITONIN, Martin  Microbiology: Recent  Results (from the past 240 hour(s))  Blood Culture (routine x 2)     Status: None (Preliminary result)   Collection Time: 07/01/19  9:19 PM   Specimen: BLOOD  Result Value Ref Range Status   Specimen Description BLOOD RIGHT ANTECUBITAL  Final   Special Requests   Final    BOTTLES DRAWN AEROBIC AND ANAEROBIC Blood Culture adequate volume   Culture   Final    NO GROWTH 4 DAYS Performed at [facility], [address], [place], [place] [address]    Report Status PENDING  Incomplete  SARS CORONAVIRUS 2 (TAT 6-24 HRS) Nasopharyngeal Nasopharyngeal Swab     Status: None   Collection Time: 07/01/19  9:23 PM   Specimen: Nasopharyngeal Swab  Result Value Ref Range Status   SARS Coronavirus 2 NEGATIVE NEGATIVE Final    Comment: (NOTE) SARS-CoV-2 target nucleic acids are NOT DETECTED. The SARS-CoV-2 RNA is generally detectable in upper and lower respiratory specimens during the acute  phase of infection. Negative results do not preclude SARS-CoV-2 infection, do not rule out co-infections with other pathogens, and should not be used as the sole basis for treatment or other patient management decisions. Negative results must be combined with clinical observations, patient history, and epidemiological information. The expected result is Negative. Fact Sheet for Patients: SugarRoll.be Fact Sheet for Healthcare Providers: https://www.woods-mathews.com/ This test is not yet approved or cleared by the [place] FDA and  has been authorized for detection and/or diagnosis of SARS-CoV-2 by FDA under an Emergency Use Authorization (EUA). This EUA will remain  in effect (meaning this test can be used) for the duration of the COVID-19 declaration under Section 56 4(b)(1) of the Act, 21 U.S.C. section 360bbb-3(b)(1), unless the authorization is terminated or revoked sooner. Performed at [facility], [place] [address] [place], [place] [address]   Urine culture     Status: Abnormal   Collection Time: 07/01/19 11:24 PM   Specimen: In/Out Cath Urine  Result Value Ref Range Status   Specimen Description IN/OUT CATH URINE  Final   Special Requests   Final    NONE Performed at [facility], [place] [address], [place], [place] [address]    Culture MULTIPLE SPECIES PRESENT, SUGGEST RECOLLECTION (A)  Final   Report Status 07/02/2019 FINAL  Final  Blood Culture (routine x 2)     Status: None (Preliminary result)   Collection Time: 07/01/19 11:26 PM   Specimen: BLOOD RIGHT HAND  Result Value Ref Range Status   Specimen Description BLOOD RIGHT HAND  Final   Special Requests   Final    BOTTLES DRAWN AEROBIC AND ANAEROBIC Blood Culture adequate volume   Culture   Final    NO GROWTH 4 DAYS Performed at [facility], [place] [address], [place], [place] [address]    Report Status PENDING  Incomplete  Respiratory Panel by PCR      Status: None   Collection Time: 07/02/19 12:46 PM   Specimen: Nasopharyngeal Swab; Respiratory  Result Value Ref Range Status   Adenovirus NOT DETECTED NOT DETECTED Final   Coronavirus 229E NOT DETECTED NOT DETECTED Final    Comment: (NOTE) The Coronavirus on the Respiratory Panel, DOES NOT test for the novel  Coronavirus (2019 nCoV)    Coronavirus HKU1 NOT DETECTED NOT DETECTED Final   Coronavirus NL63 NOT DETECTED NOT DETECTED Final   Coronavirus OC43 NOT DETECTED NOT DETECTED Final   Metapneumovirus NOT DETECTED NOT DETECTED Final  Rhinovirus / Enterovirus NOT DETECTED NOT DETECTED Final   Influenza A NOT DETECTED NOT DETECTED Final   Influenza B NOT DETECTED NOT DETECTED Final   Parainfluenza Virus 1 NOT DETECTED NOT DETECTED Final   Parainfluenza Virus 2 NOT DETECTED NOT DETECTED Final   Parainfluenza Virus 3 NOT DETECTED NOT DETECTED Final   Parainfluenza Virus 4 NOT DETECTED NOT DETECTED Final   Respiratory Syncytial Virus NOT DETECTED NOT DETECTED Final   Bordetella pertussis NOT DETECTED NOT DETECTED Final   Chlamydophila pneumoniae NOT DETECTED NOT DETECTED Final   Mycoplasma pneumoniae NOT DETECTED NOT DETECTED Final    Comment: Performed at [facility], [place] [address], [place], [place] [address]  Culture, respiratory     Status: None   Collection Time: 07/03/19  2:43 PM   Specimen: Bronchial Alveolar Lavage; Respiratory  Result Value Ref Range Status   Specimen Description BRONCHIAL ALVEOLAR LAVAGE  Final   Special Requests NONE  Final   Gram Stain   Final    MODERATE WBC PRESENT, PREDOMINANTLY MONONUCLEAR NO ORGANISMS SEEN    Culture   Final    NO GROWTH Performed at [facility], [place] [address], [place], [place] [address]    Report Status 07/05/2019 FINAL  Final  Acid Fast Smear (AFB)     Status: None   Collection Time: 07/03/19  2:43 PM   Specimen: Bronchial Alveolar Lavage; Respiratory  Result Value Ref Range Status   AFB Specimen Processing  Concentration  Final   Acid Fast Smear Negative  Final    Comment: (NOTE) Performed At: [facility] [place], [place] [id number] Rush Farmer MD EH:[id number]    Source (AFB) BRONCHIAL ALVEOLAR LAVAGE  Final    Comment: Performed at [facility], [place] [address] [place], [place] [address]    Radiology Studies: No results found.  Bonnell Public, MD [facility]  If 7PM-7AM, please contact night-coverage [email or web address] Password [facility] 07/05/2019, 2:55 PM

## 2019-07-05 NOTE — Progress Notes (Signed)
Patient ambulated 200 ft down hallway, on oxygen, patient was 88% on 1 L increased to 3 L to get 90%.  Patient did not feel lightheaded or SOB, but does state she feels weak with general soreness to the upper torso.

## 2019-07-05 NOTE — Plan of Care (Signed)

## 2019-07-05 NOTE — Progress Notes (Signed)
Patient ambulated in hall with oxygen, 350 ft, was on 3 L [place] to ambulate in hallway and maintained about 90%, however as the patient returned to her room she began dropping to 82-83%, oxygen bumped up to 6 Liters and she practiced deep breathing and was able to return to mid 90's, oxygen was decreased back to 1 L.

## 2019-07-05 NOTE — Plan of Care (Signed)
Patient awaiting results from bronchoscopy. Working on ambulating with oxygen, desaturating on 1 Liter.

## 2019-07-06 LAB — CULTURE, BLOOD (ROUTINE X 2)
Culture: NO GROWTH
Culture: NO GROWTH
Special Requests: ADEQUATE
Special Requests: ADEQUATE

## 2019-07-06 LAB — ANCA TITERS
Atypical P-ANCA titer: <1:20 {titer}
C-ANCA: <1:20 {titer}
P-ANCA: <1:20 {titer}

## 2019-07-06 NOTE — Progress Notes (Signed)
PROGRESS NOTE  Sarah Chandler HWE:[id number] DOB: [birth year]   PCP: Mellody Dance, DO  Patient is from: Home.  DOA: 07/01/2019 LOS: 5  Brief Narrative / Interim history: 34 year old female with history hyperthyroidism and recent hospitalization for pneumonia in January 2021 presenting with shortness of breath, productive cough dyspnea on exertion.  No fever, chills, chest pain or edema.  Denies smoking cigarettes or vaping but smokes marijuana.  No known sick contacts.  Used to have some chickens at home before his previous hospitalization.   In ED, HDS.  85% on RA requiring 4 L to recover.  WBC 14.  Platelets 690.  Otherwise, CBC and CMP without significant finding.  LA, protocol, UA, COVID-19, BNP and HS trop negative.  CTA chest negative for PE but progression of multifocal bilateral airspace disease concerning for atypical bacterial or fungal pneumonia, LAD with in bilateral axillary regions, mediastinum and upper abdomen.  Cultures obtained. Started on ceftriaxone and azithromycin for possible CAP.   PCCM consulted.  Infectious and autoimmune work-up nonrevealing except for elevated ESR.  She underwent bronchoscopy and RLL tracheobronchial biopsy on 07/03/2019.  QuantiFERON gold, AFB and fungal cultures pending.  There are concerns for possible lymphoma.  Pulmonary team intends to proceed with axillary lymph node excision biopsy if biopsy comes back negative.  Patient will be discharged once cleared for discharge by the pulmonary team.  Patient may need supplemental oxygen on discharge.  Subjective: No complaints No shortness of breath Patient is awaiting biopsy result to rule out lymphoma. Patient was seen alongside patient's sister and father.  Objective: Vitals:   07/05/19 1526 07/05/19 1528 07/05/19 2237 07/06/19 0730  BP: 110/68 110/68 120/63 115/66  Pulse: 88 88 87 85  Resp:  _0 Temp:  98.2 F (36.8 C) 97.9 F (36.6 C) 97.6 F (36.4 C)  TempSrc:  Oral Oral    SpO2:  94% 94% 94%  Weight:      Height:        Intake/Output Summary (Last 24 hours) at 07/06/2019 8938 Last data filed at 07/05/2019 2300 Gross per 24 hour  Intake 120 ml  Output -  Net 120 ml   Filed Weights   07/01/19 1948 07/03/19 1320  Weight: 86.2 kg 86.2 kg    Examination:  GENERAL: No acute distress.  Appears well.  HEENT: No pallor or jaundice. NECK: Supple.  No JVD RESP: Decreased air entry globally CVS: S1-S2 ABD/GI/GU: Obese, soft and nontender.  Cancer not palpable. MSK/EXT: Fullness of the ankle but no swelling. NEURO: Awake, alert and oriented appropriately.  Patient moves all extremities.  Procedures:  None  Assessment & Plan: Acute respiratory failure with hypoxia in the setting of progressive multifocal diffuse bilateral nodular pulmonary infiltrates with adenopathy as noted on CTA chest-patient with progressive SOB, cough and DOE.  Infectious work-up including COVID-19, RVP, LA, pro-Cal, blood and respiratory cultures and cryptococcal antigen negative.  Reports smoking marijuana but no cigarette or IV drug use.  ESR 77. -Appreciate PCCM guidance and help. -Personally asked RN to give her incentive spirometry and wean oxygen as able -Continue ceftriaxone and azithromycin for empiric coverage for CAP-although less likely at this point 07/06/2019: Patient has completed course of azithromycin and Rocephin.  Procalcitonin was less than 0.1.  Biopsy result is still pending.  Patient may need supplemental oxygen on discharge.    Abnormal CT chest: As above.  Hyperthyroidism: TSH 6.9 -Continue propranolol and methimazole  Marijuana use-reports smoking marijuana. -Discouraged  Normocytic anemia: Baseline  Hgb 10-12> 12 (admit)> 10.8.  Anemia panel suggests ACD. -Monitor intermittently  Anxiety -As needed hydroxyzine   DVT prophylaxis: SCD.   Code Status: Full code Family Communication:   Discharge barrier: Evaluation and treatment for acute respiratory  failure with progressive lung infiltrate.  She may need axillary lymph node excisional biopsy and potential VATS Patient is from: Home Final disposition: Likely home may be after 72 hours  Consultants: PCCM   Microbiology summarized: COVID-19 negative. RVP panel negative Blood culture negative Urine culture-multiple species. Respiratory culture negative AFB and AFC pending. Respiratory fungal culture pending. QuantiFERON gold pending.  Sch Meds:  Scheduled Meds: . mouth rinse  15 mL Mouth Rinse BID  . methimazole  5 mg Oral TID  . propranolol  40 mg Oral TID   Continuous Infusions: . lactated ringers Stopped (07/03/19 1900)   PRN Meds:.acetaminophen **OR** acetaminophen, hydrOXYzine, ondansetron **OR** ondansetron (ZOFRAN) IV  Antimicrobials: Anti-infectives (From admission, onward)   Start     Dose/Rate Route Frequency Ordered Stop   07/04/19 2200  azithromycin (ZITHROMAX) tablet 500 mg     500 mg Oral Daily at bedtime 07/04/19 1317 07/05/19 2110   07/01/19 2230  cefTRIAXone (ROCEPHIN) 2 g in sodium chloride 0.9 % 100 mL IVPB     2 g 200 mL/hr over 30 Minutes Intravenous Every 24 hours 07/01/19 2216 07/05/19 2142   07/01/19 2230  azithromycin (ZITHROMAX) 500 mg in sodium chloride 0.9 % 250 mL IVPB  Status:  Discontinued     500 mg 250 mL/hr over 60 Minutes Intravenous Every 24 hours 07/01/19 2216 07/04/19 1317       I have personally reviewed the following labs and images: CBC: Recent Labs  Lab 07/01/19 1954 07/02/19 1654 07/03/19 0217  WBC 13.9*  --  11.5*  NEUTROABS  --  6.8 6.7  HGB 12.0  --  10.8*  HCT 39.3  --  34.9*  MCV 86.8  --  86.4  PLT 690*  --  511*   BMP &GFR Recent Labs  Lab 07/01/19 1954 07/03/19 0217  NA 139 139  K 5.0 4.0  CL 104 106  CO2 26 21*  GLUCOSE 88 79  BUN <5* 5*  CREATININE 0.65 0.55  CALCIUM 9.0 8.5*  PHOS  --  4.1   Estimated Creatinine Clearance: 120.5 mL/min (by C-G formula based on SCr of 0.55 mg/dL). Liver &  Pancreas: Recent Labs  Lab 07/03/19 0217  ALBUMIN 2.4*   No results for input(s): LIPASE, AMYLASE in the last 168 hours. No results for input(s): AMMONIA in the last 168 hours. Diabetic: No results for input(s): HGBA1C in the last 72 hours. No results for input(s): GLUCAP in the last 168 hours. Cardiac Enzymes: No results for input(s): CKTOTAL, CKMB, CKMBINDEX, TROPONINI in the last 168 hours. No results for input(s): PROBNP in the last 8760 hours. Coagulation Profile: Recent Labs  Lab 07/01/19 2123  INR 1.2   Thyroid Function Tests: No results for input(s): TSH, T4TOTAL, FREET4, T3FREE, THYROIDAB in the last 72 hours. Lipid Profile: No results for input(s): CHOL, HDL, LDLCALC, TRIG, CHOLHDL, LDLDIRECT in the last 72 hours. Anemia Panel: No results for input(s): VITAMINB12, FOLATE, FERRITIN, TIBC, IRON, RETICCTPCT in the last 72 hours. Urine analysis:    Component Value Date/Time   COLORURINE STRAW (A) 07/01/2019 2324   APPEARANCEUR CLEAR 07/01/2019 2324   LABSPEC 1.017 07/01/2019 2324   PHURINE 6.0 07/01/2019 2324   GLUCOSEU NEGATIVE 07/01/2019 2324   HGBUR NEGATIVE 07/01/2019 2324  BILIRUBINUR NEGATIVE 07/01/2019 2324   KETONESUR NEGATIVE 07/01/2019 2324   PROTEINUR NEGATIVE 07/01/2019 2324   NITRITE NEGATIVE 07/01/2019 2324   LEUKOCYTESUR NEGATIVE 07/01/2019 2324   Sepsis Labs: Invalid input(s): PROCALCITONIN, LACTICIDVEN  Microbiology: Recent Results (from the past 240 hour(s))  Blood Culture (routine x 2)     Status: None   Collection Time: 07/01/19  9:19 PM   Specimen: BLOOD  Result Value Ref Range Status   Specimen Description BLOOD RIGHT ANTECUBITAL  Final   Special Requests   Final    BOTTLES DRAWN AEROBIC AND ANAEROBIC Blood Culture adequate volume   Culture NO GROWTH 5 DAYS  Final   Report Status 07/06/2019 FINAL  Final  SARS CORONAVIRUS 2 (TAT 6-24 HRS) Nasopharyngeal Nasopharyngeal Swab     Status: None   Collection Time: 07/01/19  9:23 PM    Specimen: Nasopharyngeal Swab  Result Value Ref Range Status   SARS Coronavirus 2 NEGATIVE NEGATIVE Final    Comment: (NOTE) SARS-CoV-2 target nucleic acids are NOT DETECTED. The SARS-CoV-2 RNA is generally detectable in upper and lower respiratory specimens during the acute phase of infection. Negative results do not preclude SARS-CoV-2 infection, do not rule out co-infections with other pathogens, and should not be used as the sole basis for treatment or other patient management decisions. Negative results must be combined with clinical observations, patient history, and epidemiological information. The expected result is Negative. Fact Sheet for Patients: https://www.fda.gov/media/138098/download Fact Sheet for Healthcare Providers: https://www.fda.gov/media/138095/download This test is not yet approved or cleared by the [place] FDA and  has been authorized for detection and/or diagnosis of SARS-CoV-2 by FDA under an Emergency Use Authorization (EUA). This EUA will remain  in effect (meaning this test can be used) for the duration of the COVID-19 declaration under Section 56 4(b)(1) of the Act, 21 U.S.C. section 360bbb-3(b)(1), unless the authorization is terminated or revoked sooner. Performed at [facility], [address] [place], [place] [address]   Urine culture     Status: Abnormal   Collection Time: 07/01/19 11:24 PM   Specimen: In/Out Cath Urine  Result Value Ref Range Status   Specimen Description IN/OUT CATH URINE  Final   Special Requests   Final    NONE Performed at [facility], [address] [place], [place] [address]    Culture MULTIPLE SPECIES PRESENT, SUGGEST RECOLLECTION (A)  Final   Report Status 07/02/2019 FINAL  Final  Blood Culture (routine x 2)     Status: None   Collection Time: 07/01/19 11:26 PM   Specimen: BLOOD RIGHT HAND  Result Value Ref Range Status   Specimen Description BLOOD RIGHT HAND  Final   Special Requests    Final    BOTTLES DRAWN AEROBIC AND ANAEROBIC Blood Culture adequate volume Performed at [facility], [address] [place], [place] [address]    Culture NO GROWTH 5 DAYS  Final   Report Status 07/06/2019 FINAL  Final  Respiratory Panel by PCR     Status: None   Collection Time: 07/02/19 12:46 PM   Specimen: Nasopharyngeal Swab; Respiratory  Result Value Ref Range Status   Adenovirus NOT DETECTED NOT DETECTED Final   Coronavirus 229E NOT DETECTED NOT DETECTED Final    Comment: (NOTE) The Coronavirus on the Respiratory Panel, DOES NOT test for the novel  Coronavirus (2019 nCoV)    Coronavirus HKU1 NOT DETECTED NOT DETECTED Final   Coronavirus NL63 NOT DETECTED NOT DETECTED Final     Coronavirus OC43 NOT DETECTED NOT DETECTED Final   Metapneumovirus NOT DETECTED NOT DETECTED Final   Rhinovirus / Enterovirus NOT DETECTED NOT DETECTED Final   Influenza A NOT DETECTED NOT DETECTED Final   Influenza B NOT DETECTED NOT DETECTED Final   Parainfluenza Virus 1 NOT DETECTED NOT DETECTED Final   Parainfluenza Virus 2 NOT DETECTED NOT DETECTED Final   Parainfluenza Virus 3 NOT DETECTED NOT DETECTED Final   Parainfluenza Virus 4 NOT DETECTED NOT DETECTED Final   Respiratory Syncytial Virus NOT DETECTED NOT DETECTED Final   Bordetella pertussis NOT DETECTED NOT DETECTED Final   Chlamydophila pneumoniae NOT DETECTED NOT DETECTED Final   Mycoplasma pneumoniae NOT DETECTED NOT DETECTED Final    Comment: Performed at [facility], [address] [place], [place] [address]  Culture, respiratory     Status: None   Collection Time: 07/03/19  2:43 PM   Specimen: Bronchial Alveolar Lavage; Respiratory  Result Value Ref Range Status   Specimen Description BRONCHIAL ALVEOLAR LAVAGE  Final   Special Requests NONE  Final   Gram Stain   Final    MODERATE WBC PRESENT, PREDOMINANTLY MONONUCLEAR NO ORGANISMS SEEN    Culture   Final    NO GROWTH Performed at [facility], [address] [place], [place] [address]    Report Status 07/05/2019 FINAL  Final  Acid Fast Smear (AFB)     Status: None   Collection Time: 07/03/19  2:43 PM   Specimen: Bronchial Alveolar Lavage; Respiratory  Result Value Ref Range Status   AFB Specimen Processing Concentration  Final   Acid Fast Smear Negative  Final    Comment: (NOTE) Performed At: [facility] [address] [place], [place] [id number] Nagendra Sanjai MD Ph:[phone]    Source (AFB) BRONCHIAL ALVEOLAR LAVAGE  Final    Comment: Performed at [facility], [address] [place], [place] [address]    Radiology Studies: No results found.  Sylvester I. Ogbata, MD [facility]  If 7PM-7AM, please contact night-coverage [email or web address] Password [email or web address] 07/06/2019, 9:22 AM  

## 2019-07-06 NOTE — Progress Notes (Signed)
NAME:  Sarah Chandler, MRN:  [id number], DOB:  1985-05-21, LOS: 5 ADMISSION DATE:  07/01/2019, CONSULTATION DATE:  3/25 REFERRING MD:  Hal Hope, CHIEF COMPLAINT:  Respiratory failure w/ abnormal CT chest    Brief History   34 year old female admitted 3/24 w/ recurrent/progression of multifocal airspace disease on CT scan and resultant hypoxia.   History of present illness   34 year old female who presented to the ER on 3/24 w/ cc: of exertional dyspnea over past week prior to presentation. No fever, dry mostly non-productive cough, no sick exposures. No chest pain, no swelling, no heart palpitations.  -had been treated for PNA Jan 21 w/ resolution of symptoms.  -she does have chickens. No longer cares for them; last cleaned hen house w/ regularity back in/around sept 2020. Onset of initial symptoms ~ Nov 2020 ER eval:  -Hypoxic required titration of oxygen up to 4 liters to get sats > 90% -COVID was neg, HIV Ab was neg back in Jan, PCT was neg, Ustrep was neg.  -CT chest showed: progression of bilateral multifocal airspace disease w/ LAN and small effusions.  -placed on CTX and azith. Admitted to floor PCCM asked to see for recurrent PNA  Post bronchoscopy on 3/26  Past Medical History  Hyperthyroidism Recurrent PNA  Significant Hospital Events   3/24 admitted w acute hypoxic respiratory failure  3/26 bronchoscopy Consults:  PCCM   Procedures:    Significant Diagnostic Tests:  CT chest 3/24: progression of bilateral multifocal airspace disease w/ LAN and small effusions.  Micro Data:  Strep antigen 3/24: neg Legionella antigen 3/24>>> resp virus panel 3/24>>>neg SARS coronavirus 3/24: neg BC 3/24 >> neg Crypto antigen negative  Antimicrobials:  Rocephin 3/24>>> azithro 3/24>>>  Interim history/subjective:  Afebrile Feels much better Still has some throat soreness Was able to ambulate without difficulty On room air at present  Objective   Blood pressure  115/66, pulse 85, temperature 97.6 F (36.4 C), resp. rate 20, height 5\' 10"  (1.778 m), weight 86.2 kg, last menstrual period 06/24/2019, SpO2 94 %.        Intake/Output Summary (Last 24 hours) at 07/06/2019 0958 Last data filed at 07/05/2019 2300 Gross per 24 hour  Intake 120 ml  Output --  Net 120 ml   Filed Weights   07/01/19 1948 07/03/19 1320  Weight: 86.2 kg 86.2 kg    Examination: General: Young woman, flat affect HENT: Moist oral mucosa, neck adenopathy lungs: Fair air entry, clear breath sounds Cardiovascular: RRR  Abdomen: Bowel sounds appreciated Extremities: warm and dry  Neuro: awake and oriented , nonfocal  Chest x-ray personally reviewed which shows worsening bibasilar infiltrates. CT angiogram reviewed-CT with progressive findings, reviewed by myself  Resolved Hospital Problem list     Assessment & Plan:  Acute hypoxic respiratory failure in setting of multifocal diffuse bilateral nodular pulmonary infiltrates.  -Post bronchoscopy-right midlung biopsy results pending -etiology unclear - has mold/mildew exposure in home and also had been caring for chickens up until first onset of illness just before Nov.  HIV negative 04/2019 Doubt bacterial process, no specific exposure for TB , not immunocompromised-no specific blasto exposure Inflammatory -inflammatory panel negative   Plan Completed antibiotics  Await HSP panel, urine Histo  Supplemental oxygen if needed, if sats below 90%  Await bronchoscopy biopsy results  If biopsy results negative  Patient will require axillary lymph node excision biopsy   Clinically better  Sherrilyn Rist, MD [place] [facility] Pager: [phone]

## 2019-07-07 ENCOUNTER — Encounter: Payer: Self-pay | Admitting: *Deleted

## 2019-07-07 DIAGNOSIS — C801 Malignant (primary) neoplasm, unspecified: Secondary | ICD-10-CM

## 2019-07-07 LAB — HYPERSENSITIVITY PNEUMONITIS
A. Pullulans Abs: NEGATIVE
A.Fumigatus #1 Abs: NEGATIVE
Micropolyspora faeni, IgG: NEGATIVE
Pigeon Serum Abs: NEGATIVE
Thermoact. Saccharii: NEGATIVE
Thermoactinomyces vulgaris, IgG: NEGATIVE

## 2019-07-07 LAB — CYTOLOGY - NON PAP

## 2019-07-07 LAB — HISTOPLASMA ANTIGEN, URINE: Histoplasma Antigen, urine: <0.5 (ref <0.5)

## 2019-07-07 LAB — BRAIN NATRIURETIC PEPTIDE: B Natriuretic Peptide: 13.8 pg/mL (ref 0.0–100.0)

## 2019-07-07 MED ORDER — HEPARIN SODIUM (PORCINE) 5000 UNIT/ML IJ SOLN
5000.0000 [IU] | Freq: Three times a day (TID) | INTRAMUSCULAR | Status: DC
  Administered 2019-07-07 – 2019-07-08 (×3): 5000 [IU] via SUBCUTANEOUS
  Filled 2019-07-07 (×3): qty 1

## 2019-07-07 NOTE — Progress Notes (Signed)
Moleculars and PDL 1 sent today [facility]

## 2019-07-07 NOTE — Progress Notes (Signed)
PROGRESS NOTE    Sarah Chandler  XTK:[id number] DOB: 12/20/1985 DOA: 07/01/2019 PCP: Mellody Dance, DO   Brief Narrative:  34 year old female with history hyperthyroidism and recent hospitalization for pneumonia in January 2021 presenting with shortness of breath, productive cough dyspnea on exertion.  She has been having exertional shortness of breath for the past 4 months when she was diagnosed with multifocal airspace infiltrate initially concerning for pneumonia for which she was treated but now this infiltrates have continued to progress.  During the hospitalization this time she underwent extensive work-up including bronchoscopy for right lower lobe transbronchial biopsy on 07/03/2019.  This resulted into poorly differentiated adenocarcinoma, oncology team was consulted.   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  Unfortunately this is resulted in malignancy her only risk factor being marijuana smoker.  Oncology team has been consulted. -Completed course of azithromycin and Rocephin during the hospitalization. -Continue supplemental oxygen.  As needed bronchodilators.  Hypothyroidism -On propranolol and methimazole  Recreational marijuana use -Advised to discontinue using this  Anemia of chronic disease -Hemoglobin stable.    DVT prophylaxis: Subcu heparin Code Status: Full code Family Communication: Offered patient to speak with the family but she states she would speak with them herself Disposition Plan:   Patient From= home  Patient Anticipated D/C place= Home  Barriers= maintain hospital stay as patient is still hypoxic  requiring 4 L of nasal cannula.  In the meantime will have oncology evaluation.   Subjective: Overall feels slightly better but still has exertional dyspnea.  Review of Systems Otherwise negative except as per HPI, including: General: Denies fever, chills, night sweats or unintended weight loss. Resp: Denies hemoptysis Cardiac: Denies chest pain, palpitations, orthopnea, paroxysmal nocturnal dyspnea. GI: Denies abdominal pain, nausea, vomiting, diarrhea or constipation GU: Denies dysuria, frequency, hesitancy or incontinence MS: Denies muscle aches, joint pain or swelling Neuro: Denies headache, neurologic deficits (focal weakness, numbness, tingling), abnormal gait Psych: Denies anxiety, depression, SI/HI/AVH Skin: Denies new rashes or lesions ID: Denies sick contacts, exotic exposures, travel  Examination:  General exam: Appears calm and comfortable  Respiratory system: Bilateral rhonchi diffusely. Cardiovascular system: S1 & S2 heard, RRR. No JVD, murmurs, rubs, gallops or clicks. No pedal edema. Gastrointestinal system: Abdomen is nondistended, soft and nontender. No organomegaly or masses felt. Normal bowel sounds heard. Central nervous system: Alert and oriented. No focal neurological deficits. Extremities: Symmetric 5 x 5 power. Skin: No rashes, lesions or ulcers Psychiatry: Judgement and insight appear normal. Mood & affect appropriate.     Objective: Vitals:   07/06/19 0730 07/06/19 1626 07/06/19 2330 07/07/19 0725  BP: 115/66  118/60 115/68  Pulse: 85 91 85 90  Resp: 20  14 16   Temp: 97.6 F (36.4 C)  98.3 F (36.8 C) 98.1 F (36.7 C)  TempSrc:   Oral   SpO2: 94%  94% 91%  Weight:      Height:        Intake/Output Summary (Last 24 hours) at 07/07/2019 1157 Last data filed at 07/06/2019 2200 Gross per 24 hour  Intake 240 ml  Output --  Net 240 ml   Filed Weights   07/01/19 1948 07/03/19 1320  Weight: 86.2 kg 86.2 kg     Data  Reviewed:    CBC: Recent Labs  Lab 07/01/19 1954 07/02/19 1654 07/03/19 0217  WBC 13.9*  --  11.5*  NEUTROABS  --  6.8 6.7  HGB 12.0  --  10.8*  HCT 39.3  --  34.9*  MCV 86.8  --  86.4  PLT 690*  --  161*   Basic Metabolic Panel: Recent Labs  Lab 07/01/19 1954 07/03/19 0217  NA 139 139  K 5.0 4.0  CL 104 106  CO2 26 21*  GLUCOSE 88 79  BUN <5* 5*  CREATININE 0.65 0.55  CALCIUM 9.0 8.5*  PHOS  --  4.1   GFR: Estimated Creatinine Clearance: 120.5 mL/min (by C-G formula based on SCr of 0.55 mg/dL). Liver Function Tests: Recent Labs  Lab 07/03/19 0217  ALBUMIN 2.4*   No results for input(s): LIPASE, AMYLASE in the last 168 hours. No results for input(s): AMMONIA in the last 168 hours. Coagulation Profile: Recent Labs  Lab 07/01/19 2123  INR 1.2   Cardiac Enzymes: No results for input(s): CKTOTAL, CKMB, CKMBINDEX, TROPONINI in the last 168 hours. BNP (last 3 results) No results for input(s): PROBNP in the last 8760 hours. HbA1C: No results for input(s): HGBA1C in the last 72 hours. CBG: No results for input(s): GLUCAP in the last 168 hours. Lipid Profile: No results for input(s): CHOL, HDL, LDLCALC, TRIG, CHOLHDL, LDLDIRECT in the last 72 hours. Thyroid Function Tests: No results for input(s): TSH, T4TOTAL, FREET4, T3FREE, THYROIDAB in the last 72 hours. Anemia Panel: No results for input(s): VITAMINB12, FOLATE, FERRITIN, TIBC, IRON, RETICCTPCT in the last 72 hours. Sepsis Labs: Recent Labs  Lab 07/01/19 2123 07/01/19 2319 07/01/19 2324 07/03/19 0217 07/04/19 0251  PROCALCITON  --   --  <0.10 <0.10 <0.10  LATICACIDVEN 1.6 1.4  --   --   --     Recent Results (from the past 240 hour(s))  Blood Culture (routine x 2)     Status: None   Collection Time: 07/01/19  9:19 PM   Specimen: BLOOD  Result Value Ref Range Status   Specimen Description BLOOD RIGHT ANTECUBITAL  Final   Special Requests   Final    BOTTLES DRAWN AEROBIC AND ANAEROBIC Blood Culture  adequate volume   Culture NO GROWTH 5 DAYS  Final   Report Status 07/06/2019 FINAL  Final  SARS CORONAVIRUS 2 (TAT 6-24 HRS) Nasopharyngeal Nasopharyngeal Swab     Status: None   Collection Time: 07/01/19  9:23 PM   Specimen: Nasopharyngeal Swab  Result Value Ref Range Status   SARS Coronavirus 2 NEGATIVE NEGATIVE Final    Comment: (NOTE) SARS-CoV-2 target nucleic acids are NOT DETECTED. The SARS-CoV-2 RNA is generally detectable in upper and lower respiratory specimens during the acute phase of infection. Negative results do not preclude SARS-CoV-2 infection, do not rule out co-infections with other pathogens, and should not be used as the sole basis for treatment or other patient management decisions. Negative results must be combined with clinical observations, patient history, and epidemiological information. The expected result is Negative. Fact Sheet for Patients: SugarRoll.be Fact Sheet for Healthcare Providers: https://www.woods-mathews.com/ This test is not yet approved or cleared by the [place] FDA and  has been authorized for detection and/or diagnosis of SARS-CoV-2 by FDA under an Emergency Use Authorization (EUA). This EUA will remain  in effect (meaning this test can be used) for the duration of the COVID-19 declaration under Section 56 4(b)(1) of the Act, 21 U.S.C. section 360bbb-3(b)(1), unless the authorization  is terminated or revoked sooner. Performed at [facility], [place] [address], [place], [place] [address]   Urine culture     Status: Abnormal   Collection Time: 07/01/19 11:24 PM   Specimen: In/Out Cath Urine  Result Value Ref Range Status   Specimen Description IN/OUT CATH URINE  Final   Special Requests   Final    NONE Performed at [facility], [place] [address] [place], [place] [address]    Culture MULTIPLE SPECIES PRESENT, SUGGEST RECOLLECTION (A)  Final   Report Status 07/02/2019 FINAL  Final   Blood Culture (routine x 2)     Status: None   Collection Time: 07/01/19 11:26 PM   Specimen: BLOOD RIGHT HAND  Result Value Ref Range Status   Specimen Description BLOOD RIGHT HAND  Final   Special Requests   Final    BOTTLES DRAWN AEROBIC AND ANAEROBIC Blood Culture adequate volume Performed at [facility], [place] [address], [place], [place] [address]    Culture NO GROWTH 5 DAYS  Final   Report Status 07/06/2019 FINAL  Final  Respiratory Panel by PCR     Status: None   Collection Time: 07/02/19 12:46 PM   Specimen: Nasopharyngeal Swab; Respiratory  Result Value Ref Range Status   Adenovirus NOT DETECTED NOT DETECTED Final   Coronavirus 229E NOT DETECTED NOT DETECTED Final    Comment: (NOTE) The Coronavirus on the Respiratory Panel, DOES NOT test for the novel  Coronavirus (2019 nCoV)    Coronavirus HKU1 NOT DETECTED NOT DETECTED Final   Coronavirus NL63 NOT DETECTED NOT DETECTED Final   Coronavirus OC43 NOT DETECTED NOT DETECTED Final   Metapneumovirus NOT DETECTED NOT DETECTED Final   Rhinovirus / Enterovirus NOT DETECTED NOT DETECTED Final   Influenza A NOT DETECTED NOT DETECTED Final   Influenza B NOT DETECTED NOT DETECTED Final   Parainfluenza Virus 1 NOT DETECTED NOT DETECTED Final   Parainfluenza Virus 2 NOT DETECTED NOT DETECTED Final   Parainfluenza Virus 3 NOT DETECTED NOT DETECTED Final   Parainfluenza Virus 4 NOT DETECTED NOT DETECTED Final   Respiratory Syncytial Virus NOT DETECTED NOT DETECTED Final   Bordetella pertussis NOT DETECTED NOT DETECTED Final   Chlamydophila pneumoniae NOT DETECTED NOT DETECTED Final   Mycoplasma pneumoniae NOT DETECTED NOT DETECTED Final    Comment: Performed at [facility], [place] [address] [place], [place] [address]  Culture, respiratory     Status: None   Collection Time: 07/03/19  2:43 PM   Specimen: Bronchial Alveolar Lavage; Respiratory  Result Value Ref Range Status   Specimen Description BRONCHIAL ALVEOLAR  LAVAGE  Final   Special Requests NONE  Final   Gram Stain   Final    MODERATE WBC PRESENT, PREDOMINANTLY MONONUCLEAR NO ORGANISMS SEEN    Culture   Final    NO GROWTH Performed at [facility], [place] [address], [place],  [address]    Report Status 07/05/2019 FINAL  Final  Acid Fast Smear (AFB)     Status: None   Collection Time: 07/03/19  2:43 PM   Specimen: Bronchial Alveolar Lavage; Respiratory  Result Value Ref Range Status   AFB Specimen Processing Concentration  Final   Acid Fast Smear Negative  Final    Comment: (NOTE) Performed At: [facility] [place], [place] [id number] Rush Farmer MD TD:[id number]    Source (AFB) BRONCHIAL ALVEOLAR LAVAGE  Final    Comment: Performed at  [facility], [place] [address], [place], [place] [address]         Radiology Studies: No results found.      Scheduled Meds: . mouth rinse  15 mL Mouth Rinse BID  . methimazole  5 mg Oral TID  . propranolol  40 mg Oral TID   Continuous Infusions: . lactated ringers Stopped (07/03/19 1900)     LOS: 6 days   Time spent= 35 mins    Henok Heacock Arsenio Loader, MD [facility]  If 7PM-7AM, please contact night-coverage  07/07/2019, 11:57 AM

## 2019-07-07 NOTE — Progress Notes (Signed)
   NAME:  Sarah Chandler, MRN:  [id number], DOB:  Nov 17, 1985, LOS: 6 ADMISSION DATE:  07/01/2019, CONSULTATION DATE:  3/25 REFERRING MD:  Hal Hope, CHIEF COMPLAINT:  Respiratory failure w/ abnormal CT chest    Brief History   34 year old female admitted 3/24 w/ recurrent/progression of multifocal airspace disease on CT scan and resultant hypoxia.   History of present illness   34 year old female who presented to the ER on 3/24 w/ cc: of exertional dyspnea over past week prior to presentation. No fever, dry mostly non-productive cough, no sick exposures. No chest pain, no swelling, no heart palpitations.  -had been treated for PNA Jan 21 w/ resolution of symptoms.  -she does have chickens. No longer cares for them; last cleaned hen house w/ regularity back in/around sept 2020. Onset of initial symptoms ~ Nov 2020 ER eval:  -Hypoxic required titration of oxygen up to 4 liters to get sats > 90% -COVID was neg, HIV Ab was neg back in Jan, PCT was neg, Ustrep was neg.  -CT chest showed: progression of bilateral multifocal airspace disease w/ LAN and small effusions.  -placed on CTX and azith. Admitted to floor PCCM asked to see for recurrent PNA  Post bronchoscopy on 3/26  Past Medical History  Hyperthyroidism Recurrent PNA  Significant Hospital Events   3/24 admitted w acute hypoxic respiratory failure  3/26 bronchoscopy Consults:  PCCM   Procedures:  Bronchoscopy 3/26  Significant Diagnostic Tests:  CT chest 3/24: progression of bilateral multifocal airspace disease w/ LAN and small effusions.  Micro Data:  Strep antigen 3/24: neg Legionella antigen 3/24>>> resp virus panel 3/24>>>neg SARS coronavirus 3/24: neg BC 3/24 >> neg Crypto antigen negative  Antimicrobials:  Rocephin 3/24>>> azithro 3/24>>>  Interim history/subjective:  Afebrile Feels better On oxygen supplementation  Objective   Blood pressure 115/68, pulse 93, temperature 98.1 F (36.7 C), resp. rate 16,  height 5\' 10"  (1.778 m), weight 86.2 kg, last menstrual period 06/24/2019, SpO2 91 %.        Intake/Output Summary (Last 24 hours) at 07/07/2019 1609 Last data filed at 07/06/2019 2200 Gross per 24 hour  Intake 240 ml  Output --  Net 240 ml   Filed Weights   07/01/19 1948 07/03/19 1320  Weight: 86.2 kg 86.2 kg    Examination: General: Young woman, flat affect HENT: Moist oral mucosa, neck adenopathy lungs: Rales at the bases Cardiovascular: RRR  Abdomen: Bowel sounds appreciated   Chest x-ray personally reviewed which shows worsening bibasilar infiltrates. CT angiogram reviewed-CT with progressive findings, reviewed by myself  CT films were reviewed with the patient  Resolved Hospital Problem list     Assessment & Plan:  Acute hypoxic respiratory failure in setting of multifocal diffuse bilateral nodular pulmonary infiltrates.  Bronchoscopic biopsy is positive for adenocarcinoma  Plan Completed antibiotics  Await oncology evaluation  Continue oxygen supplementation  Discussed biopsy results with patient, questions answered  Sherrilyn Rist, MD [facility] [facility] Pager: [phone]

## 2019-07-07 NOTE — Consult Note (Signed)
Sarah Chandler [phone]   [phone]  CONSULT NOTE  REFERRING PHYSICIAN: Dr. Kara Mead  REASON FOR CONSULTATION:  34 years old white female recently diagnosed with lung cancer.  HPI Sarah Chandler is a 34 y.o. female never smoker with no significant past medical history except for suspicious pneumonia.  The patient was admitted to the hospital on May 08, 2019 complaining of tachycardia and shortness of breath with exertion as well as intolerance to exercises started since October 2020.  She was seen by her primary care physician but she was found to have significant tachycardia with heart rate of 154 and she was transferred to the hospital for evaluation.  She had CT angiogram of the chest on May 08, 2019 and that showed no evidence of acute pulmonary embolism but there was extensive peripheral predominant areas of patchy consolidated airspace disease throughout both lungs was suspicious for multifocal pneumonia.  The patient was treated for community-acquired pneumonia at that time with Rocephin and azithromycin in addition to IV fluid.  She was also diagnosed with hyperthyroidism at that time with very low TSH and high T4.  She was referred to endocrinology.  The patient presented to the emergency department again on July 01, 2019 complaining of shortness of breath and cough mostly with exertion for 1 week.  She was hypoxic on 4 L of oxygen.  Covid test was negative.  She underwent CT angiogram of the chest on June 28, 2019 and it showed progression of the multifocal bilateral airspace disease that was seen previously on the scan of May 08, 2019.  The persistent appearance was favoring atypical bacterial or fungal pneumonia and bronchoscopy was recommended.  The patient had small bilateral pleural effusion right greater than left in addition to lymphadenopathy within the bilateral axillary regions, mediastinum and upper abdomen that was  nonspecific.  There was also no evidence of pulmonary embolism.  On July 03, 2019 the patient underwent bronchoscopy with bronchial lavage and brushing as well as transbronchial biopsies under the care of Dr. Elsworth Soho.  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 By immunohistochemistry, the neoplastic cells are positive for cytokeratin 7 and TTF-1 but negative for cytokeratin 20 and CDX2. Additional markers (GATA3 and PAX 8).  In the absence of other lesions, the current immunoprofile  is consistent with a lung primary. Dr. Elsworth Soho kindly asked me to see the patient for evaluation and recommendation regarding treatment of her condition. When seen today the patient is feeling much better.  She is expected to be discharged from the hospital later today.  She continues to have the shortness of breath with exertion and she may go home with home oxygen.  She denied having any chest pain but has cough with no hemoptysis.  She denied having any fever or chills.  She has no nausea, vomiting, diarrhea or constipation. Family history is remarkable for pancreatic cancer in grandfather.  There is no history of malignancy in her sister or her parents. The patient is single and has no children.  She is staying home taking care of her parents.  She has no history of smoking except for recreational marijuana.  She denied having any history of alcohol or drug abuse.   HPI  Past Medical History:  Diagnosis Date   Pneumonia 05/08/2019    Past Surgical History:  Procedure Laterality Date   BIOPSY  07/03/2019   Procedure: BIOPSY;  Surgeon: Rigoberto Noel,  MD;  Location: [facility];  Service: Cardiopulmonary;;   BRONCHIAL BRUSHINGS  07/03/2019   Procedure: BRONCHIAL BRUSHINGS;  Surgeon: Rigoberto Noel, MD;  Location: [facility];  Service: Cardiopulmonary;;   BRONCHIAL WASHINGS  07/03/2019   Procedure: BRONCHIAL WASHINGS;  Surgeon: Rigoberto Noel,  MD;  Location: [facility] ENDOSCOPY;  Service: Cardiopulmonary;;   NO PAST SURGERIES     TOOTH EXTRACTION     VIDEO BRONCHOSCOPY N/A 07/03/2019   Procedure: VIDEO BRONCHOSCOPY WITH FLUORO;  Surgeon: Rigoberto Noel, MD;  Location: [facility];  Service: Cardiopulmonary;  Laterality: N/A;  LMA    Family History  Problem Relation Age of Onset   Diabetes Father    Hyperlipidemia Father    Hypertension Father    Depression Maternal Uncle    Diabetes Maternal Uncle    Cancer Maternal Grandmother    Hyperlipidemia Maternal Grandmother    Cancer Paternal Grandfather     Social History Social History   Tobacco Use   Smoking status: Never Smoker   Smokeless tobacco: Never Used  Substance Use Topics   Alcohol use: Never   Drug use: Never    Allergies  Allergen Reactions   Iron Other (See Comments)    Ferrosol - Liquid  Unknown reaction     Current Facility-Administered Medications  Medication Dose Route Frequency Provider Last Rate Last Admin   acetaminophen (TYLENOL) tablet 650 mg  650 mg Oral Q6H PRN Rise Patience, MD   650 mg at 07/05/19 1219   Or   acetaminophen (TYLENOL) suppository 650 mg  650 mg Rectal Q6H PRN Rise Patience, MD       heparin injection 5,000 Units  5,000 Units Subcutaneous Q8H Amin, Jeanella Flattery, MD   5,000 Units at 07/07/19 1534   hydrOXYzine (ATARAX/VISTARIL) tablet 25 mg  25 mg Oral TID PRN Mercy Riding, MD   25 mg at 07/07/19 1126   lactated ringers infusion   Intravenous Continuous Rigoberto Noel, MD   Stopped at 07/03/19 1900   MEDLINE mouth rinse  15 mL Mouth Rinse BID Wendee Beavers T, MD   15 mL at 07/06/19 2109   methimazole (TAPAZOLE) tablet 5 mg  5 mg Oral TID Rise Patience, MD   5 mg at 07/07/19 1533   ondansetron (ZOFRAN) tablet 4 mg  4 mg Oral Q6H PRN Rise Patience, MD       Or   ondansetron [facility]) injection 4 mg  4 mg Intravenous Q6H PRN Rise Patience, MD       propranolol (INDERAL) tablet 40 mg  40 mg Oral TID  Rise Patience, MD   40 mg at 07/07/19 1533    Review of Systems  Constitutional: negative Eyes: negative Ears, nose, mouth, throat, and face: negative Respiratory: positive for cough and dyspnea on exertion Cardiovascular: negative Gastrointestinal: negative Genitourinary:negative Integument/breast: negative Hematologic/lymphatic: negative Musculoskeletal:negative Neurological: negative Behavioral/Psych: negative Endocrine: negative Allergic/Immunologic: negative  Physical Exam  ZMO:QHUTM, healthy, no distress, well nourished, well developed, and anxious SKIN: skin color, texture, turgor are normal, no rashes or significant lesions HEAD: Normocephalic, No masses, lesions, tenderness or abnormalities EYES: normal, PERRLA EARS: External ears normal OROPHARYNX:no exudate, no erythema, and lips, buccal mucosa, and tongue normal  NECK: supple, no adenopathy LYMPH:  no palpable lymphadenopathy, no hepatosplenomegaly BREAST:not examined LUNGS: coarse sounds heard, scattered rhonchi bilaterally HEART: regular rate & rhythm, no murmurs, and no gallops ABDOMEN:abdomen soft, non-tender, and obese BACK: Back symmetric, no curvature., No CVA tenderness  EXTREMITIES:no joint deformities, effusion, or inflammation, no edema  NEURO: alert & oriented x 3 with fluent speech, no focal motor/sensory deficits  PERFORMANCE STATUS: ECOG 1  LABORATORY DATA: Lab Results  Component Value Date   WBC 11.5 (H) 07/03/2019   HGB 10.8 (L) 07/03/2019   HCT 34.9 (L) 07/03/2019   MCV 86.4 07/03/2019   PLT 511 (H) 07/03/2019    '@LASTCHEM' @  RADIOGRAPHIC STUDIES: CT Angio Chest PE W/Cm &/Or Wo Cm  Result Date: 07/01/2019 CLINICAL DATA:  Shortness of breath, cough, recent diagnosis of pneumonia EXAM: CT ANGIOGRAPHY CHEST WITH CONTRAST TECHNIQUE: Multidetector CT imaging of the chest was performed using the standard protocol during bolus administration of intravenous contrast. Multiplanar CT  image reconstructions and MIPs were obtained to evaluate the vascular anatomy. CONTRAST:  84m OMNIPAQUE IOHEXOL 350 MG/ML SOLN COMPARISON:  05/08/2019, 07/01/2019 FINDINGS: Cardiovascular: This is a technically adequate evaluation of the pulmonary vasculature. There are no filling defects or pulmonary emboli. The heart is unremarkable without pericardial effusion. Thoracic aorta is normal in caliber with no evidence of dissection. Mediastinum/Nodes: There is bilateral axillary adenopathy. Largest lymph node in the right axilla measures 27 x 14 mm. Multiple subcentimeter lymph nodes within the mediastinum are stable. The thyroid, esophagus, and trachea are unremarkable. Lungs/Pleura: Since the prior exam, there has been marked progression of the bilateral nodular airspace disease. Marked increase in dense consolidation at the lung bases bilaterally. There is persistent diffuse interlobular septal thickening. Small bilateral pleural effusions have developed in the interim, right greater than left. No pneumothorax. The central airways are patent. Upper Abdomen: Multiple subcentimeter lymph nodes are seen in the gastrohepatic ligament, largest measuring 9 mm reference image 116. No other acute abnormalities. Musculoskeletal: Innumerable punctate sclerotic foci are seen throughout the thoracic spine and sternum, nonspecific. There are no acute or destructive bony lesions. Review of the MIP images confirms the above findings. IMPRESSION: 1. Progression of the multifocal bilateral airspace disease seen previously. The persistence an appearance would favor atypical bacterial or fungal pneumonia. Correlation with bronchoscopy and sputum analysis may be useful. 2. Small bilateral pleural effusions, right greater than left. 3. Lymphadenopathy within the bilateral axillary regions, mediastinum, and upper abdomen, nonspecific. No significant change since prior study. 4. No evidence of pulmonary embolus. Electronically Signed    By: MRanda NgoM.D.   On: 07/01/2019 22:32   DG CHEST PORT 1 VIEW  Result Date: 07/03/2019 CLINICAL DATA:  Status post bronchoscopy for respiratory failure. EXAM: PORTABLE CHEST 1 VIEW COMPARISON:  July 01 2019 FINDINGS: The mediastinal contour and cardiac silhouette are stable. Bibasilar infiltrates are noted without interval change. Left suprahilar opacity is identified unchanged. There is no pleural line to suggest pneumothorax. The bony structures are stable. IMPRESSION: 1. No pneumothorax. 2. Bibasilar infiltrates unchanged. Left suprahilar opacity unchanged. Electronically Signed   By: WAbelardo DieselM.D.   On: 07/03/2019 15:18   DG Chest Portable 1 View  Result Date: 07/01/2019 CLINICAL DATA:  Shortness of breath. EXAM: PORTABLE CHEST 1 VIEW COMPARISON:  May 07, 2019 FINDINGS: Worsening bibasilar pulmonary infiltrates. Probable small associated effusions. The heart, hila, and mediastinum are unchanged. No other interval changes. IMPRESSION: Worsening bibasilar infiltrates with small associated effusions. This may represent pneumonia or aspiration. It would be unusual for pneumonia two persist since May 07, 2019 however. Recurrent pneumonia or repeated aspiration are possibilities. Recommend clinical correlation and short-term follow-up imaging to ensure resolution. Electronically Signed   By: DDorise BullionIII M.D   On: 07/01/2019  21:14   DG C-ARM BRONCHOSCOPY  Result Date: 07/03/2019 C-ARM BRONCHOSCOPY: Fluoroscopy was utilized by the requesting physician.  No radiographic interpretation.    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   PLAN: I had a  lengthy discussion with the patient today about her current disease stage, prognosis and treatment options. Unfortunately the patient has incurable condition and all the treatment will be of palliative nature. I discussed the prognosis with the patient. I also discussed with her the treatment options including palliative care versus palliative systemic chemotherapy with immunotherapy versus treatment with target agent if the patient has an actionable mutation. The patient is interested in treatment. We will send the tissue block to [facility] for molecular studies and PD-L1 expression.  I will also consider the patient for blood test for the molecular studies because the tissue molecular test may take up to 3 weeks for the results to become available. I recommended for the patient to complete the staging work-up by ordering MRI of the brain to rule out brain metastasis. I will also arrange for the patient to have a PET scan done on outpatient basis after discharge. I will arrange for the patient to come to the cancer center few days after discharge for more detailed discussion of her treatment options based on the molecular studies and imaging staging work-up. I provided the patient with my contact information and will follow up closely with her after discharge for further recommendation. The patient voices understanding of current disease status and treatment options and is in agreement with the current care plan.  All questions were answered. The patient knows to call the clinic with any problems, questions or concerns. We can certainly see the patient much sooner if necessary.  Thank you so much for allowing me to participate in the care of Sarah Chandler. I will continue to follow up the patient with you and assist in her care.  Disclaimer: This note was dictated with voice recognition software. Similar sounding words can inadvertently be transcribed and may not be corrected upon  review.   Eilleen Kempf July 08, 2019, 8:47 AM

## 2019-07-07 NOTE — Progress Notes (Signed)
Oncology Nurse Navigator Documentation  Oncology Nurse Navigator Flowsheets 07/07/2019  Navigator Location [facility]  Navigator Encounter Type Other: I received a message from pathology regarding [facility] and PDL 1 testing.  I contacted Dr. Elsworth Soho to get an update.    Acuity Level 2-Minimal Needs (1-2 Barriers Identified)  Time Spent with Patient 30

## 2019-07-07 NOTE — Progress Notes (Signed)
SATURATION QUALIFICATIONS: (This note is used to comply with regulatory documentation for home oxygen)  Patient Saturations on Room Air at Rest = 88%  Patient Saturations on Room Air while Ambulating = 82%  Patient Saturations on 4 Liters of oxygen while Ambulating = 88%  Please briefly explain why patient needs home oxygen: Pt dropped in oxygen saturation while ambulating. Oxygen was increased from 1L to 4L during ambulation to recover from activity.

## 2019-07-08 ENCOUNTER — Inpatient Hospital Stay (HOSPITAL_COMMUNITY): Payer: Medicaid Other

## 2019-07-08 ENCOUNTER — Other Ambulatory Visit: Payer: Self-pay | Admitting: Internal Medicine

## 2019-07-08 DIAGNOSIS — J9601 Acute respiratory failure with hypoxia: Secondary | ICD-10-CM

## 2019-07-08 DIAGNOSIS — C801 Malignant (primary) neoplasm, unspecified: Secondary | ICD-10-CM

## 2019-07-08 LAB — CBC
HCT: 35.8 % — ABNORMAL LOW (ref 36.0–46.0)
Hemoglobin: 10.9 g/dL — ABNORMAL LOW (ref 12.0–15.0)
MCH: 26.5 pg (ref 26.0–34.0)
MCHC: 30.4 g/dL (ref 30.0–36.0)
MCV: 87.1 fL (ref 80.0–100.0)
Platelets: 541 10*3/uL — ABNORMAL HIGH (ref 150–400)
RBC: 4.11 MIL/uL (ref 3.87–5.11)
RDW: 16.3 % — ABNORMAL HIGH (ref 11.5–15.5)
WBC: 12.2 10*3/uL — ABNORMAL HIGH (ref 4.0–10.5)
nRBC: 0 % (ref 0.0–0.2)

## 2019-07-08 LAB — BASIC METABOLIC PANEL
Anion gap: 11 (ref 5–15)
BUN: <5 mg/dL — ABNORMAL LOW (ref 6–20)
CO2: 25 mmol/L (ref 22–32)
Calcium: 8.7 mg/dL — ABNORMAL LOW (ref 8.9–10.3)
Chloride: 101 mmol/L (ref 98–111)
Creatinine, Ser: 0.62 mg/dL (ref 0.44–1.00)
GFR calc Af Amer: >60 mL/min (ref >60)
GFR calc non Af Amer: >60 mL/min (ref >60)
Glucose, Bld: 89 mg/dL (ref 70–99)
Potassium: 4 mmol/L (ref 3.5–5.1)
Sodium: 137 mmol/L (ref 135–145)

## 2019-07-08 LAB — BLASTOMYCES ANTIGEN: Blastomyces Antigen: NOT DETECTED ng/mL

## 2019-07-08 LAB — SURGICAL PATHOLOGY

## 2019-07-08 LAB — MAGNESIUM: Magnesium: 1.9 mg/dL (ref 1.7–2.4)

## 2019-07-08 MED ORDER — ALBUTEROL SULFATE HFA 108 (90 BASE) MCG/ACT IN AERS: 2.0000 | INHALATION_SPRAY | Freq: Four times a day (QID) | RESPIRATORY_TRACT | 1 refills | Status: DC | PRN

## 2019-07-08 MED ORDER — HYDROXYZINE HCL 25 MG PO TABS: 25.0000 mg | ORAL_TABLET | Freq: Three times a day (TID) | ORAL | 0 refills | Status: DC | PRN

## 2019-07-08 MED ORDER — GADOBUTROL 1 MMOL/ML IV SOLN
8.0000 mL | Freq: Once | INTRAVENOUS | Status: AC | PRN
  Administered 2019-07-08: 8 mL via INTRAVENOUS

## 2019-07-08 NOTE — Progress Notes (Signed)
SATURATION QUALIFICATIONS: (This note is used to comply with regulatory documentation for home oxygen)  Patient Saturations on Room Air at Rest = 85%  Patient Saturations on Room Air while Ambulating = 83%  Patient Saturations on 4 Liters of oxygen while Ambulating = 91%  Please briefly explain why patient needs home oxygen: Patient saturations on room air were below 88. Patient require O2 when ambulating around the the unit up to 4L to improve oxygenation above 88%.

## 2019-07-08 NOTE — TOC Transition Note (Signed)
Transition of Care [facility]) - CM/SW Discharge Note   Patient Details  Name: Sarah Chandler MRN: [id number] Date of Birth: 1986-03-24  Transition of [facility]) CM/SW Contact:  Atilano Median, LCSW Phone Number: 07/08/2019, 2:28 PM   Clinical Narrative:     Discharged home with new oxygen requirements. Patient will need to put a card on file with [employer] prior to discharging. Patient is aware and agreeable to this plan.   Unit RN has been instructed to complete oxygen documentation so that oxygen can be delivered.   Final next level of care: Home/Self Care Barriers to Discharge: Barriers Resolved   Patient Goals and CMS Choice   CMS Medicare.gov Compare Post Acute Care list provided to:: Patient Choice offered to / list presented to : Patient  Discharge Placement                       Discharge Plan and Services                DME Arranged: Oxygen DME Agency: [facility]                  Social Determinants of Health (SDOH) Interventions     Readmission Risk Interventions No flowsheet data found.

## 2019-07-08 NOTE — Progress Notes (Signed)
   NAME:  Sarah Chandler, MRN:  [id number], DOB:  [birth year], LOS: 7 ADMISSION DATE:  07/01/2019, CONSULTATION DATE:  3/25 REFERRING MD:  Hal Hope, CHIEF COMPLAINT:  Respiratory failure w/ abnormal CT chest    Brief History   34 year old female admitted 3/24 w/ recurrent/progression of multifocal airspace disease on CT scan and resultant hypoxia.   History of present illness   34 year old female who presented to the ER on 3/24 w/ cc: of exertional dyspnea over past week prior to presentation. No fever, dry mostly non-productive cough, no sick exposures. No chest pain, no swelling, no heart palpitations.  -had been treated for PNA Jan 21 w/ resolution of symptoms.  -she does have chickens. No longer cares for them; last cleaned hen house w/ regularity back in/around sept 2020. Onset of initial symptoms ~ Nov 2020 ER eval:  -Hypoxic required titration of oxygen up to 4 liters to get sats > 90% -COVID was neg, HIV Ab was neg back in Jan, PCT was neg, Ustrep was neg.  -CT chest showed: progression of bilateral multifocal airspace disease w/ LAN and small effusions.  -placed on CTX and azith. Admitted to floor PCCM asked to see for recurrent PNA  Post bronchoscopy on 3/26 Adenocarcinoma  Past Medical History  Hyperthyroidism Recurrent PNA  Significant Hospital Events   3/24 admitted w acute hypoxic respiratory failure  3/26 bronchoscopy Consults:  PCCM   Procedures:  Bronchoscopy 3/26  Significant Diagnostic Tests:  CT chest 3/24: progression of bilateral multifocal airspace disease w/ LAN and small effusions.  Micro Data:  Strep antigen 3/24: neg Legionella antigen 3/24>>> resp virus panel 3/24>>>neg SARS coronavirus 3/24: neg BC 3/24 >> neg Crypto antigen negative  Antimicrobials:  Rocephin 3/24>>> azithro 3/24>>>  Interim history/subjective:  Afebrile Feels better On oxygen supplementation Has no complaints today  Objective   Blood pressure 114/68, pulse 82,  temperature 98.1 F (36.7 C), resp. rate 16, height 5\' 10"  (1.778 m), weight 86.2 kg, last menstrual period 06/24/2019, SpO2 94 %.       No intake or output data in the 24 hours ending 07/08/19 0946 Filed Weights   07/01/19 1948 07/03/19 1320  Weight: 86.2 kg 86.2 kg    Examination: General: Young woman, flat affect HENT: Moist oral mucosa, neck adenopathy lungs: Bibasal rales Cardiovascular: RRR  Abdomen: Bowel sounds appreciated   Chest x-ray personally reviewed which shows worsening bibasilar infiltrates. CT angiogram reviewed-CT with progressive findings, reviewed by myself  CT films were reviewed with the patient  Resolved Hospital Problem list     Assessment & Plan:  Acute hypoxic respiratory failure in setting of multifocal diffuse bilateral nodular pulmonary infiltrates.  Found to have adenocarcinoma on bronchoscopic biopsies Appreciate oncology evaluation-plan for further evaluation and treatment ongoing  Plan Completed antibiotics  Continue oxygen supplementation-try to wean off oxygen supplementation as tolerated  Discussed biopsy results with patient, questions answered  Sherrilyn Rist, MD [facility] Pager: [phone]

## 2019-07-08 NOTE — Discharge Summary (Signed)
Physician Discharge Summary  Sarah Chandler MWU:[id number] DOB: August 03, 1985 DOA: 07/01/2019  PCP: Mellody Dance, DO  Admit date: 07/01/2019 Discharge date: 07/08/2019  Admitted From: Home Disposition: Home  Recommendations for Outpatient Follow-up:  1. Follow up with PCP in 1-2 weeks 2. Please obtain BMP/CBC in one week your next doctors visit.  3. Follow-up outpatient oncology, Dr. Earlie Server.  Arrangements to be made by their service 4. Albuterol and Atarax prescription given to be used as needed 5. Home oxygen arranged  Home Health: None Equipment/Devices: Home oxygen regular Discharge Condition: Stable CODE STATUS: Full Diet recommendation: Regular  Brief/Interim Summary: 34 year old female with history hyperthyroidism and recent hospitalization for pneumonia in January 2021 presenting with shortness of breath, productive cough dyspnea on exertion.  She has been having exertional shortness of breath for the past 4 months when she was diagnosed with multifocal airspace infiltrate initially concerning for pneumonia for which she was treated but now this infiltrates have continued to progress.  During the hospitalization this time she underwent extensive work-up including bronchoscopy for right lower lobe transbronchial biopsy on 07/03/2019.  This resulted into poorly differentiated adenocarcinoma, oncology team was consulted.  MRI was performed prior to her discharge.  Home oxygen was arranged for as she was desaturating down to less than 85% on room air requiring 4 L of nasal cannula. Today she is medically stable to be discharged with recommendations above.  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  Unfortunately this is resulted in malignancy her only risk factor being marijuana smoker.   Oncology team has been consulted.  MRI performed for staging, results pending this will be followed up by oncology team. -Completed course of azithromycin and Rocephin -Home oxygen to be arranged for due to hypoxia with ambulation. 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   Discharge Diagnoses:  Principal Problem:   Acute respiratory failure with hypoxia (HCC) Active Problems:   Hyperthyroidism   Shortness of breath   Pulmonary infiltrates   Adenocarcinoma [facility])    Consultations:  Oncology  Pulmonary  Subjective: Feels okay no complaints.  Still hypoxic with ambulation but wants to go home.  Discharge Exam: Vitals:   07/08/19 0028 07/08/19 0731  BP: (!) 104/59 114/68  Pulse: 81 82  Resp: 20 16  Temp: (!) 97.4 F (36.3 C) 98.1 F (36.7 C)  SpO2: 94% 94%   Vitals:   07/07/19 1533 07/07/19 1704 07/08/19 0028 07/08/19 0731  BP:  114/64 (!) 104/59 114/68  Pulse: 93 90 81 82  Resp:  16 20 16   Temp:  97.7 F (36.5 C) (!) 97.4 F (36.3 C) 98.1 F (36.7 C)  TempSrc:   Oral   SpO2:  90% 94% 94%  Weight:      Height:        General: Pt is alert, awake, not in acute distress, 3 L nasal cannula Cardiovascular: RRR, S1/S2 +, no rubs, no gallops Respiratory: CTA bilaterally, no wheezing, no rhonchi Abdominal: Soft, NT, ND, bowel sounds + Extremities: no edema, no cyanosis  Discharge Instructions   Allergies as of 07/08/2019      Reactions   Iron Other (See Comments)   Ferrosol - Liquid  Unknown reaction  Medication List    TAKE these medications   acetaminophen 500 MG tablet Commonly known as: TYLENOL Take 1,000 mg by mouth every 6 (six) hours as needed for moderate pain or headache.   albuterol 108 (90 Base) MCG/ACT  inhaler Commonly known as: VENTOLIN HFA Inhale 2 puffs into the lungs every 6 (six) hours as needed for wheezing or shortness of breath.   guaifenesin 100 MG/5ML syrup Commonly known as: ROBITUSSIN Take 200 mg by mouth 3 (three) times daily as needed for cough.   hydrOXYzine 25 MG tablet Commonly known as: ATARAX/VISTARIL Take 1 tablet (25 mg total) by mouth 3 (three) times daily as needed for anxiety.   methimazole 5 MG tablet Commonly known as: TAPAZOLE Take 1 tablet (5 mg total) by mouth 3 (three) times daily.   multivitamin with minerals Tabs tablet Take 2 tablets by mouth daily. Gummies   OVER THE COUNTER MEDICATION Take 2 each by mouth daily as needed (anxiety). CBD gummies   propranolol 40 MG tablet Commonly known as: INDERAL Take 1 tablet (40 mg total) by mouth 3 (three) times daily.            Durable Medical Equipment  (From admission, onward)         Start     Ordered   07/08/19 0737  For home use only DME oxygen  Once    Comments: Dx Lung Adenocarcinoma  Question Answer Comment  Length of Need 12 Months   Mode or (Route) Nasal cannula   Liters per Minute 2   Frequency Continuous (stationary and portable oxygen unit needed)   Oxygen delivery system Gas      07/08/19 0736         Follow-up Information    Mellody Dance, DO. Schedule an appointment as soon as possible for a visit in 1 week(s).   Specialty: Family Medicine Contact information: [address] [place] [address] [phone]          Allergies  Allergen Reactions  . Iron Other (See Comments)    Ferrosol - Liquid  Unknown reaction     You were cared for by a hospitalist during your hospital stay. If you have any questions about your discharge medications or the care you received while you were in the hospital after you are discharged, you can call the unit and asked to speak with the hospitalist on call if the hospitalist that took care of you is not available. Once  you are discharged, your primary care physician will handle any further medical issues. Please note that no refills for any discharge medications will be authorized once you are discharged, as it is imperative that you return to your primary care physician (or establish a relationship with a primary care physician if you do not have one) for your aftercare needs so that they can reassess your need for medications and monitor your lab values.   Procedures/Studies: CT Angio Chest PE W/Cm &/Or Wo Cm  Result Date: 07/01/2019 CLINICAL DATA:  Shortness of breath, cough, recent diagnosis of pneumonia EXAM: CT ANGIOGRAPHY CHEST WITH CONTRAST TECHNIQUE: Multidetector CT imaging of the chest was performed using the standard protocol during bolus administration of intravenous contrast. Multiplanar CT image reconstructions and MIPs were obtained to evaluate the vascular anatomy. CONTRAST:  85mL OMNIPAQUE IOHEXOL 350 MG/ML SOLN COMPARISON:  05/08/2019, 07/01/2019 FINDINGS: Cardiovascular: This is a technically adequate evaluation of the pulmonary vasculature. There are no filling defects or pulmonary emboli. The heart is unremarkable  without pericardial effusion. Thoracic aorta is normal in caliber with no evidence of dissection. Mediastinum/Nodes: There is bilateral axillary adenopathy. Largest lymph node in the right axilla measures 27 x 14 mm. Multiple subcentimeter lymph nodes within the mediastinum are stable. The thyroid, esophagus, and trachea are unremarkable. Lungs/Pleura: Since the prior exam, there has been marked progression of the bilateral nodular airspace disease. Marked increase in dense consolidation at the lung bases bilaterally. There is persistent diffuse interlobular septal thickening. Small bilateral pleural effusions have developed in the interim, right greater than left. No pneumothorax. The central airways are patent. Upper Abdomen: Multiple subcentimeter lymph nodes are seen in the gastrohepatic  ligament, largest measuring 9 mm reference image 116. No other acute abnormalities. Musculoskeletal: Innumerable punctate sclerotic foci are seen throughout the thoracic spine and sternum, nonspecific. There are no acute or destructive bony lesions. Review of the MIP images confirms the above findings. IMPRESSION: 1. Progression of the multifocal bilateral airspace disease seen previously. The persistence an appearance would favor atypical bacterial or fungal pneumonia. Correlation with bronchoscopy and sputum analysis may be useful. 2. Small bilateral pleural effusions, right greater than left. 3. Lymphadenopathy within the bilateral axillary regions, mediastinum, and upper abdomen, nonspecific. No significant change since prior study. 4. No evidence of pulmonary embolus. Electronically Signed   By: Randa Ngo M.D.   On: 07/01/2019 22:32   MR BRAIN W WO CONTRAST  Result Date: 07/08/2019 CLINICAL DATA:  Non-small-cell lung cancer staging EXAM: MRI HEAD WITHOUT AND WITH CONTRAST TECHNIQUE: Multiplanar, multiecho pulse sequences of the brain and surrounding structures were obtained without and with intravenous contrast. CONTRAST:  73mL GADAVIST GADOBUTROL 1 MMOL/ML IV SOLN COMPARISON:  None. FINDINGS: Brain: Ventricle size normal. Negative for acute infarct. Negative for hemorrhage. Small enhancing lesions are present in the brain compatible with metastatic disease. 2 mm enhancing lesion right parietal operculum axial image 32 2 x 4 mm enhancing lesion right occipital parietal lobe axial image 32 3 mm enhancing mass left posterior frontal cortex which appears anterior to the motor cortex axial image 43 2 mm enhancing lesion in the left frontal parietal lobe which may be within the motor cortex axial image 39. No significant brain edema.  No midline shift. Vascular: Normal arterial flow voids Skull and upper cervical spine: No focal skeletal lesion. 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 No significant edema or hemorrhage. Electronically Signed   By: Franchot Gallo M.D.   On: 07/08/2019 11:15   DG CHEST PORT 1 VIEW  Result Date: 07/03/2019 CLINICAL DATA:  Status post bronchoscopy for respiratory failure. EXAM: PORTABLE CHEST 1 VIEW COMPARISON:  July 01 2019 FINDINGS: The mediastinal contour and cardiac silhouette are stable. Bibasilar infiltrates are noted without interval change. Left suprahilar opacity is identified unchanged. There is no pleural line to suggest pneumothorax. The bony structures are stable. IMPRESSION: 1. No pneumothorax. 2. Bibasilar infiltrates unchanged. Left suprahilar opacity unchanged. Electronically Signed   By: Abelardo Diesel M.D.   On: 07/03/2019 15:18   DG Chest Portable 1 View  Result Date: 07/01/2019 CLINICAL DATA:  Shortness of breath. EXAM: PORTABLE CHEST 1 VIEW COMPARISON:  May 07, 2019 FINDINGS: Worsening bibasilar pulmonary infiltrates. Probable small associated effusions. The heart, hila, and mediastinum are unchanged. No other interval changes. IMPRESSION: Worsening bibasilar infiltrates with small associated effusions. This may represent pneumonia or aspiration. It would be unusual for pneumonia two persist since May 07, 2019  however. Recurrent pneumonia or repeated aspiration are possibilities. Recommend clinical correlation and short-term follow-up imaging to ensure resolution. Electronically Signed   By: Dorise Bullion III M.D   On: 07/01/2019 21:14   DG C-ARM BRONCHOSCOPY  Result Date: 07/03/2019 C-ARM BRONCHOSCOPY: Fluoroscopy was utilized by the requesting physician.  No radiographic interpretation.      The results of significant diagnostics from this hospitalization (including imaging, microbiology, ancillary and laboratory) are listed below for reference.     Microbiology: Recent Results (from the past 240 hour(s))  Blood Culture (routine x  2)     Status: None   Collection Time: 07/01/19  9:19 PM   Specimen: BLOOD  Result Value Ref Range Status   Specimen Description BLOOD RIGHT ANTECUBITAL  Final   Special Requests   Final    BOTTLES DRAWN AEROBIC AND ANAEROBIC Blood Culture adequate volume   Culture NO GROWTH 5 DAYS  Final   Report Status 07/06/2019 FINAL  Final  SARS CORONAVIRUS 2 (TAT 6-24 HRS) Nasopharyngeal Nasopharyngeal Swab     Status: None   Collection Time: 07/01/19  9:23 PM   Specimen: Nasopharyngeal Swab  Result Value Ref Range Status   SARS Coronavirus 2 NEGATIVE NEGATIVE Final    Comment: (NOTE) SARS-CoV-2 target nucleic acids are NOT DETECTED. The SARS-CoV-2 RNA is generally detectable in upper and lower respiratory specimens during the acute phase of infection. Negative results do not preclude SARS-CoV-2 infection, do not rule out co-infections with other pathogens, and should not be used as the sole basis for treatment or other patient management decisions. Negative results must be combined with clinical observations, patient history, and epidemiological information. The expected result is Negative. Fact Sheet for Patients: SugarRoll.be Fact Sheet for Healthcare Providers: https://www.woods-mathews.com/ This test is not yet approved or cleared by the [place] FDA and  has been authorized for detection and/or diagnosis of SARS-CoV-2 by FDA under an Emergency Use Authorization (EUA). This EUA will remain  in effect (meaning this test can be used) for the duration of the COVID-19 declaration under Section 56 4(b)(1) of the Act, 21 U.S.C. section 360bbb-3(b)(1), unless the authorization is terminated or revoked sooner. Performed at [facility], [place] [address], [place], [place] [address]   Urine culture     Status: Abnormal   Collection Time: 07/01/19 11:24 PM   Specimen: In/Out Cath Urine  Result Value Ref Range Status   Specimen Description  IN/OUT CATH URINE  Final   Special Requests   Final    NONE Performed at [facility], [place] [address], [place], [place] [address]    Culture MULTIPLE SPECIES PRESENT, SUGGEST RECOLLECTION (A)  Final   Report Status 07/02/2019 FINAL  Final  Blood Culture (routine x 2)     Status: None   Collection Time: 07/01/19 11:26 PM   Specimen: BLOOD RIGHT HAND  Result Value Ref Range Status   Specimen Description BLOOD RIGHT HAND  Final   Special Requests   Final    BOTTLES DRAWN AEROBIC AND ANAEROBIC Blood Culture adequate volume Performed at [facility], [place] [address], [place], [place] [address]    Culture NO GROWTH 5 DAYS  Final   Report Status 07/06/2019 FINAL  Final  Respiratory Panel by PCR     Status: None   Collection Time: 07/02/19 12:46 PM   Specimen: Nasopharyngeal Swab; Respiratory  Result Value Ref Range Status   Adenovirus NOT DETECTED NOT DETECTED Final   Coronavirus 229E NOT  DETECTED NOT DETECTED Final    Comment: (NOTE) The Coronavirus on the Respiratory Panel, DOES NOT test for the novel  Coronavirus (2019 nCoV)    Coronavirus HKU1 NOT DETECTED NOT DETECTED Final   Coronavirus NL63 NOT DETECTED NOT DETECTED Final   Coronavirus OC43 NOT DETECTED NOT DETECTED Final   Metapneumovirus NOT DETECTED NOT DETECTED Final   Rhinovirus / Enterovirus NOT DETECTED NOT DETECTED Final   Influenza A NOT DETECTED NOT DETECTED Final   Influenza B NOT DETECTED NOT DETECTED Final   Parainfluenza Virus 1 NOT DETECTED NOT DETECTED Final   Parainfluenza Virus 2 NOT DETECTED NOT DETECTED Final   Parainfluenza Virus 3 NOT DETECTED NOT DETECTED Final   Parainfluenza Virus 4 NOT DETECTED NOT DETECTED Final   Respiratory Syncytial Virus NOT DETECTED NOT DETECTED Final   Bordetella pertussis NOT DETECTED NOT DETECTED Final   Chlamydophila pneumoniae NOT DETECTED NOT DETECTED Final   Mycoplasma pneumoniae NOT DETECTED NOT DETECTED Final    Comment: Performed at [place], [place] [address] [place], [place] [address]  Culture, respiratory     Status: None   Collection Time: 07/03/19  2:43 PM   Specimen: Bronchial Alveolar Lavage; Respiratory  Result Value Ref Range Status   Specimen Description BRONCHIAL ALVEOLAR LAVAGE  Final   Special Requests NONE  Final   Gram Stain   Final    MODERATE WBC PRESENT, PREDOMINANTLY MONONUCLEAR NO ORGANISMS SEEN    Culture   Final    NO GROWTH Performed at [facility], [place] [address] [place], [place] [address]    Report Status 07/05/2019 FINAL  Final  Acid Fast Smear (AFB)     Status: None   Collection Time: 07/03/19  2:43 PM   Specimen: Bronchial Alveolar Lavage; Respiratory  Result Value Ref Range Status   AFB Specimen Processing Concentration  Final   Acid Fast Smear Negative  Final    Comment: (NOTE) Performed At: [facility] [place], [place] [id number] Rush Farmer MD [id number]    Source (AFB) BRONCHIAL ALVEOLAR LAVAGE  Final    Comment: Performed at [facility], [place] [address], [place], [place] [address]     Labs: BNP (last 3 results) Recent Labs    05/07/19 2133 07/01/19 2200 07/07/19 1319  BNP 15.2 20.0 82.9   Basic Metabolic Panel: Recent Labs  Lab 07/01/19 1954 07/03/19 0217 07/08/19 0319  NA 139 139 137  K 5.0 4.0 4.0  CL 104 106 101  CO2 26 21* 25  GLUCOSE 88 79 89  BUN <5* 5* <5*  CREATININE 0.65 0.55 0.62  CALCIUM 9.0 8.5* 8.7*  MG  --   --  1.9  PHOS  --  4.1  --    Liver Function Tests: Recent Labs  Lab 07/03/19 0217  ALBUMIN 2.4*   No results for input(s): LIPASE, AMYLASE in the last 168 hours. No results for input(s): AMMONIA in the last 168 hours. CBC: Recent Labs  Lab 07/01/19 1954 07/02/19 1654 07/03/19 0217 07/08/19 0319  WBC 13.9*  --  11.5* 12.2*  NEUTROABS  --  6.8 6.7  --   HGB 12.0  --  10.8* 10.9*  HCT 39.3  --  34.9* 35.8*  MCV 86.8  --  86.4 87.1  PLT 690*  --  511* 541*   Cardiac Enzymes: No results  for input(s): CKTOTAL, CKMB, CKMBINDEX, TROPONINI in the last 168 hours. BNP: Invalid input(s): POCBNP CBG: No results  for input(s): GLUCAP in the last 168 hours. D-Dimer No results for input(s): DDIMER in the last 72 hours. Hgb A1c No results for input(s): HGBA1C in the last 72 hours. Lipid Profile No results for input(s): CHOL, HDL, LDLCALC, TRIG, CHOLHDL, LDLDIRECT in the last 72 hours. Thyroid function studies No results for input(s): TSH, T4TOTAL, T3FREE, THYROIDAB in the last 72 hours.  Invalid input(s): FREET3 Anemia work up No results for input(s): VITAMINB12, FOLATE, FERRITIN, TIBC, IRON, RETICCTPCT in the last 72 hours. Urinalysis    Component Value Date/Time   COLORURINE STRAW (A) 07/01/2019 2324   APPEARANCEUR CLEAR 07/01/2019 2324   LABSPEC 1.017 07/01/2019 2324   PHURINE 6.0 07/01/2019 2324   GLUCOSEU NEGATIVE 07/01/2019 2324   HGBUR NEGATIVE 07/01/2019 2324   BILIRUBINUR NEGATIVE 07/01/2019 2324   KETONESUR NEGATIVE 07/01/2019 2324   PROTEINUR NEGATIVE 07/01/2019 2324   NITRITE NEGATIVE 07/01/2019 2324   LEUKOCYTESUR NEGATIVE 07/01/2019 2324   Sepsis Labs Invalid input(s): PROCALCITONIN,  WBC,  LACTICIDVEN Microbiology Recent Results (from the past 240 hour(s))  Blood Culture (routine x 2)     Status: None   Collection Time: 07/01/19  9:19 PM   Specimen: BLOOD  Result Value Ref Range Status   Specimen Description BLOOD RIGHT ANTECUBITAL  Final   Special Requests   Final    BOTTLES DRAWN AEROBIC AND ANAEROBIC Blood Culture adequate volume   Culture NO GROWTH 5 DAYS  Final   Report Status 07/06/2019 FINAL  Final  SARS CORONAVIRUS 2 (TAT 6-24 HRS) Nasopharyngeal Nasopharyngeal Swab     Status: None   Collection Time: 07/01/19  9:23 PM   Specimen: Nasopharyngeal Swab  Result Value Ref Range Status   SARS Coronavirus 2 NEGATIVE NEGATIVE Final    Comment: (NOTE) SARS-CoV-2 target nucleic acids are NOT DETECTED. The SARS-CoV-2 RNA is generally detectable in  upper and lower respiratory specimens during the acute phase of infection. Negative results do not preclude SARS-CoV-2 infection, do not rule out co-infections with other pathogens, and should not be used as the sole basis for treatment or other patient management decisions. Negative results must be combined with clinical observations, patient history, and epidemiological information. The expected result is Negative. Fact Sheet for Patients: SugarRoll.be Fact Sheet for Healthcare Providers: https://www.woods-mathews.com/ This test is not yet approved or cleared by the [place] FDA and  has been authorized for detection and/or diagnosis of SARS-CoV-2 by FDA under an Emergency Use Authorization (EUA). This EUA will remain  in effect (meaning this test can be used) for the duration of the COVID-19 declaration under Section 56 4(b)(1) of the Act, 21 U.S.C. section 360bbb-3(b)(1), unless the authorization is terminated or revoked sooner. Performed at [facility], [place] [address], [place], [place] [address]   Urine culture     Status: Abnormal   Collection Time: 07/01/19 11:24 PM   Specimen: In/Out Cath Urine  Result Value Ref Range Status   Specimen Description IN/OUT CATH URINE  Final   Special Requests   Final    NONE Performed at [facility], [place] [address], [place], [place] [address]    Culture MULTIPLE SPECIES PRESENT, SUGGEST RECOLLECTION (A)  Final   Report Status 07/02/2019 FINAL  Final  Blood Culture (routine x 2)     Status: None   Collection Time: 07/01/19 11:26 PM   Specimen: BLOOD RIGHT HAND  Result Value Ref Range Status   Specimen Description BLOOD RIGHT HAND  Final   Special Requests  Final    BOTTLES DRAWN AEROBIC AND ANAEROBIC Blood Culture adequate volume Performed at [facility], [place] [address], [place], [place] [address]    Culture NO GROWTH 5 DAYS  Final   Report Status 07/06/2019 FINAL   Final  Respiratory Panel by PCR     Status: None   Collection Time: 07/02/19 12:46 PM   Specimen: Nasopharyngeal Swab; Respiratory  Result Value Ref Range Status   Adenovirus NOT DETECTED NOT DETECTED Final   Coronavirus 229E NOT DETECTED NOT DETECTED Final    Comment: (NOTE) The Coronavirus on the Respiratory Panel, DOES NOT test for the novel  Coronavirus (2019 nCoV)    Coronavirus HKU1 NOT DETECTED NOT DETECTED Final   Coronavirus NL63 NOT DETECTED NOT DETECTED Final   Coronavirus OC43 NOT DETECTED NOT DETECTED Final   Metapneumovirus NOT DETECTED NOT DETECTED Final   Rhinovirus / Enterovirus NOT DETECTED NOT DETECTED Final   Influenza A NOT DETECTED NOT DETECTED Final   Influenza B NOT DETECTED NOT DETECTED Final   Parainfluenza Virus 1 NOT DETECTED NOT DETECTED Final   Parainfluenza Virus 2 NOT DETECTED NOT DETECTED Final   Parainfluenza Virus 3 NOT DETECTED NOT DETECTED Final   Parainfluenza Virus 4 NOT DETECTED NOT DETECTED Final   Respiratory Syncytial Virus NOT DETECTED NOT DETECTED Final   Bordetella pertussis NOT DETECTED NOT DETECTED Final   Chlamydophila pneumoniae NOT DETECTED NOT DETECTED Final   Mycoplasma pneumoniae NOT DETECTED NOT DETECTED Final    Comment: Performed at [facility], [place] [address], [place], [place] [address]  Culture, respiratory     Status: None   Collection Time: 07/03/19  2:43 PM   Specimen: Bronchial Alveolar Lavage; Respiratory  Result Value Ref Range Status   Specimen Description BRONCHIAL ALVEOLAR LAVAGE  Final   Special Requests NONE  Final   Gram Stain   Final    MODERATE WBC PRESENT, PREDOMINANTLY MONONUCLEAR NO ORGANISMS SEEN    Culture   Final    NO GROWTH Performed at [facility], [place] [address], [place], [place] [address]    Report Status 07/05/2019 FINAL  Final  Acid Fast Smear (AFB)     Status: None   Collection Time: 07/03/19  2:43 PM   Specimen: Bronchial Alveolar Lavage; Respiratory  Result Value Ref  Range Status   AFB Specimen Processing Concentration  Final   Acid Fast Smear Negative  Final    Comment: (NOTE) Performed At: [facility] [place], [place] [id number] Rush Farmer MD [id number]    Source (AFB) BRONCHIAL ALVEOLAR LAVAGE  Final    Comment: Performed at [facility], [place] [address] [place], [place] [address]     Time coordinating discharge:  I have spent 35 minutes face to face with the patient and on the ward discussing the patients care, assessment, plan and disposition with other care givers. >50% of the time was devoted counseling the patient about the risks and benefits of treatment/Discharge disposition and coordinating care.   SIGNED:   Damita Lack, MD  [facility] 07/08/2019, 11:28 AM   If 7PM-7AM, please contact night-coverage

## 2019-07-09 ENCOUNTER — Encounter: Payer: Self-pay | Admitting: Internal Medicine

## 2019-07-09 ENCOUNTER — Other Ambulatory Visit: Payer: Self-pay | Admitting: Internal Medicine

## 2019-07-09 ENCOUNTER — Telehealth: Payer: Self-pay | Admitting: *Deleted

## 2019-07-09 ENCOUNTER — Encounter: Payer: Self-pay | Admitting: *Deleted

## 2019-07-09 ENCOUNTER — Inpatient Hospital Stay: Payer: Medicaid Other | Attending: Physician Assistant

## 2019-07-09 ENCOUNTER — Other Ambulatory Visit: Payer: Self-pay

## 2019-07-09 DIAGNOSIS — C3490 Malignant neoplasm of unspecified part of unspecified bronchus or lung: Secondary | ICD-10-CM

## 2019-07-09 DIAGNOSIS — C801 Malignant (primary) neoplasm, unspecified: Secondary | ICD-10-CM

## 2019-07-09 NOTE — Progress Notes (Signed)
I reached out to [phone] to get application for reduce cost of lab test.  I and Dr. Julien Nordmann completed form and it was faxed

## 2019-07-09 NOTE — Telephone Encounter (Signed)
Called and scheduled her [facility] 360 lab test.

## 2019-07-09 NOTE — Progress Notes (Unsigned)
pet

## 2019-07-10 ENCOUNTER — Telehealth: Payer: Self-pay | Admitting: General Practice

## 2019-07-10 ENCOUNTER — Telehealth: Payer: Self-pay | Admitting: Internal Medicine

## 2019-07-10 ENCOUNTER — Other Ambulatory Visit: Payer: Self-pay | Admitting: *Deleted

## 2019-07-10 NOTE — Progress Notes (Signed)
The purpose treatment plan discussion in cancer conference 07/09/19 is for discussion purpose only and is not a binding recommendation.  The patient was not physically examined nor present for their treatment options.  Therefore, final treatment plans cannot be decided.

## 2019-07-10 NOTE — Telephone Encounter (Signed)
[place] [facility] CSW Progress Notes  Called patient at request of nurse navigator.  Unable to reach patient - "she is not her at this time" - but did talk to mother.  Major concern is "how to eat better."  Wants to eat vegetables/fruits and similar in order to "boost her immune system." Patient has switched to "plant based meat other that seafood."  Lives w parents and was helping care for elderly grandmother. Is now unable to help w this.  Has home oxygen, "she is not happy with that because it is very bulky."  No home health or other in home services.  Has Family Planning Medicaid - urged family to contact DSS in their county/their caseworker and request full Medicaid.  Patient can also consider applying for Social Security disability if desired.  Mother states that patient is in the diagnostic/treatment planning phase - when a treatment plan is in place, she can be linked w [facility] [employer] and can initiate application for disability if this is applicable to her situation.  Edwyna Shell, LCSW Clinical Social Worker Phone:  [phone] Cell:  [phone]

## 2019-07-10 NOTE — Telephone Encounter (Signed)
Scheduled appt per 4/1 sch message - pt aware of apt date and time

## 2019-07-13 ENCOUNTER — Encounter: Payer: Medicaid Other | Admitting: Family Medicine

## 2019-07-14 ENCOUNTER — Other Ambulatory Visit: Payer: Self-pay

## 2019-07-14 ENCOUNTER — Emergency Department (HOSPITAL_COMMUNITY): Payer: Medicaid Other

## 2019-07-14 ENCOUNTER — Encounter (HOSPITAL_COMMUNITY): Payer: Self-pay

## 2019-07-14 ENCOUNTER — Encounter (HOSPITAL_COMMUNITY): Payer: Self-pay | Admitting: Internal Medicine

## 2019-07-14 ENCOUNTER — Ambulatory Visit (HOSPITAL_COMMUNITY): Admission: RE | Admit: 2019-07-14 | Payer: Self-pay | Source: Ambulatory Visit

## 2019-07-14 ENCOUNTER — Inpatient Hospital Stay (HOSPITAL_COMMUNITY)
Admission: EM | Admit: 2019-07-14 | Discharge: 2019-07-21 | DRG: 180 | Disposition: A | Discharge status: Home / Self Care | Payer: Medicaid Other | Attending: Internal Medicine | Admitting: Internal Medicine

## 2019-07-14 ENCOUNTER — Encounter (HOSPITAL_COMMUNITY): Payer: Self-pay | Admitting: Emergency Medicine

## 2019-07-14 DIAGNOSIS — R7989 Other specified abnormal findings of blood chemistry: Secondary | ICD-10-CM | POA: Diagnosis present

## 2019-07-14 DIAGNOSIS — Z818 Family history of other mental and behavioral disorders: Secondary | ICD-10-CM | POA: Diagnosis not present

## 2019-07-14 DIAGNOSIS — J189 Pneumonia, unspecified organism: Secondary | ICD-10-CM | POA: Diagnosis not present

## 2019-07-14 DIAGNOSIS — J90 Pleural effusion, not elsewhere classified: Secondary | ICD-10-CM | POA: Diagnosis present

## 2019-07-14 DIAGNOSIS — D72823 Leukemoid reaction: Secondary | ICD-10-CM | POA: Diagnosis present

## 2019-07-14 DIAGNOSIS — C3492 Malignant neoplasm of unspecified part of left bronchus or lung: Secondary | ICD-10-CM | POA: Diagnosis present

## 2019-07-14 DIAGNOSIS — Z20822 Contact with and (suspected) exposure to covid-19: Secondary | ICD-10-CM | POA: Diagnosis not present

## 2019-07-14 DIAGNOSIS — R0602 Shortness of breath: Secondary | ICD-10-CM | POA: Diagnosis not present

## 2019-07-14 DIAGNOSIS — F32A Depression, unspecified: Secondary | ICD-10-CM | POA: Diagnosis present

## 2019-07-14 DIAGNOSIS — E0590 Thyrotoxicosis, unspecified without thyrotoxic crisis or storm: Secondary | ICD-10-CM | POA: Diagnosis present

## 2019-07-14 DIAGNOSIS — D649 Anemia, unspecified: Secondary | ICD-10-CM | POA: Diagnosis present

## 2019-07-14 DIAGNOSIS — C3490 Malignant neoplasm of unspecified part of unspecified bronchus or lung: Secondary | ICD-10-CM | POA: Diagnosis present

## 2019-07-14 DIAGNOSIS — J9621 Acute and chronic respiratory failure with hypoxia: Secondary | ICD-10-CM | POA: Diagnosis not present

## 2019-07-14 DIAGNOSIS — Z833 Family history of diabetes mellitus: Secondary | ICD-10-CM

## 2019-07-14 DIAGNOSIS — Z79899 Other long term (current) drug therapy: Secondary | ICD-10-CM

## 2019-07-14 DIAGNOSIS — C7931 Secondary malignant neoplasm of brain: Secondary | ICD-10-CM | POA: Diagnosis present

## 2019-07-14 DIAGNOSIS — F410 Panic disorder [episodic paroxysmal anxiety] without agoraphobia: Secondary | ICD-10-CM | POA: Diagnosis present

## 2019-07-14 DIAGNOSIS — F411 Generalized anxiety disorder: Secondary | ICD-10-CM | POA: Diagnosis present

## 2019-07-14 DIAGNOSIS — C3491 Malignant neoplasm of unspecified part of right bronchus or lung: Secondary | ICD-10-CM | POA: Diagnosis present

## 2019-07-14 DIAGNOSIS — F329 Major depressive disorder, single episode, unspecified: Secondary | ICD-10-CM | POA: Diagnosis present

## 2019-07-14 DIAGNOSIS — F321 Major depressive disorder, single episode, moderate: Secondary | ICD-10-CM | POA: Diagnosis present

## 2019-07-14 DIAGNOSIS — Z8249 Family history of ischemic heart disease and other diseases of the circulatory system: Secondary | ICD-10-CM

## 2019-07-14 DIAGNOSIS — Z83438 Family history of other disorder of lipoprotein metabolism and other lipidemia: Secondary | ICD-10-CM | POA: Diagnosis not present

## 2019-07-14 HISTORY — DX: Malignant (primary) neoplasm, unspecified: C80.1

## 2019-07-14 LAB — COMPREHENSIVE METABOLIC PANEL
ALT: 26 U/L (ref 0–44)
AST: 27 U/L (ref 15–41)
Albumin: 2.7 g/dL — ABNORMAL LOW (ref 3.5–5.0)
Alkaline Phosphatase: 89 U/L (ref 38–126)
Anion gap: 11 (ref 5–15)
BUN: 7 mg/dL (ref 6–20)
CO2: 22 mmol/L (ref 22–32)
Calcium: 8.8 mg/dL — ABNORMAL LOW (ref 8.9–10.3)
Chloride: 103 mmol/L (ref 98–111)
Creatinine, Ser: 0.57 mg/dL (ref 0.44–1.00)
GFR calc Af Amer: >60 mL/min (ref >60)
GFR calc non Af Amer: >60 mL/min (ref >60)
Glucose, Bld: 107 mg/dL — ABNORMAL HIGH (ref 70–99)
Potassium: 4.3 mmol/L (ref 3.5–5.1)
Sodium: 136 mmol/L (ref 135–145)
Total Bilirubin: 0.6 mg/dL (ref 0.3–1.2)
Total Protein: 7.6 g/dL (ref 6.5–8.1)

## 2019-07-14 LAB — TROPONIN I (HIGH SENSITIVITY)
Troponin I (High Sensitivity): <2 ng/L (ref <18)
Troponin I (High Sensitivity): <2 ng/L (ref <18)

## 2019-07-14 LAB — CBC WITH DIFFERENTIAL/PLATELET
Abs Immature Granulocytes: 0.09 10*3/uL — ABNORMAL HIGH (ref 0.00–0.07)
Basophils Absolute: 0.1 10*3/uL (ref 0.0–0.1)
Basophils Relative: 1 %
Eosinophils Absolute: 0.2 10*3/uL (ref 0.0–0.5)
Eosinophils Relative: 1 %
HCT: 37.2 % (ref 36.0–46.0)
Hemoglobin: 11.5 g/dL — ABNORMAL LOW (ref 12.0–15.0)
Immature Granulocytes: 1 %
Lymphocytes Relative: 11 %
Lymphs Abs: 2 10*3/uL (ref 0.7–4.0)
MCH: 27.1 pg (ref 26.0–34.0)
MCHC: 30.9 g/dL (ref 30.0–36.0)
MCV: 87.5 fL (ref 80.0–100.0)
Monocytes Absolute: 1.5 10*3/uL — ABNORMAL HIGH (ref 0.1–1.0)
Monocytes Relative: 8 %
Neutro Abs: 14.3 10*3/uL — ABNORMAL HIGH (ref 1.7–7.7)
Neutrophils Relative %: 78 %
Platelets: 620 10*3/uL — ABNORMAL HIGH (ref 150–400)
RBC: 4.25 MIL/uL (ref 3.87–5.11)
RDW: 16 % — ABNORMAL HIGH (ref 11.5–15.5)
WBC: 18.2 10*3/uL — ABNORMAL HIGH (ref 4.0–10.5)
nRBC: 0 % (ref 0.0–0.2)

## 2019-07-14 LAB — SARS CORONAVIRUS 2 (TAT 6-24 HRS): SARS Coronavirus 2: NEGATIVE

## 2019-07-14 LAB — PROCALCITONIN: Procalcitonin: <0.1 ng/mL

## 2019-07-14 LAB — BLOOD GAS, ARTERIAL
Acid-base deficit: 0.1 mmol/L (ref 0.0–2.0)
Bicarbonate: 23.6 mmol/L (ref 20.0–28.0)
Drawn by: 295031
FIO2: 100
O2 Saturation: 92.3 %
Patient temperature: 98.6
pCO2 arterial: 37.3 mmHg (ref 32.0–48.0)
pH, Arterial: 7.418 (ref 7.350–7.450)
pO2, Arterial: 68.4 mmHg — ABNORMAL LOW (ref 83.0–108.0)

## 2019-07-14 LAB — PHOSPHORUS: Phosphorus: 4.2 mg/dL (ref 2.5–4.6)

## 2019-07-14 LAB — POC SARS CORONAVIRUS 2 AG -  ED: SARS Coronavirus 2 Ag: NEGATIVE

## 2019-07-14 LAB — MRSA PCR SCREENING: MRSA by PCR: NEGATIVE

## 2019-07-14 LAB — BRAIN NATRIURETIC PEPTIDE: B Natriuretic Peptide: 53.4 pg/mL (ref 0.0–100.0)

## 2019-07-14 LAB — MAGNESIUM: Magnesium: 1.9 mg/dL (ref 1.7–2.4)

## 2019-07-14 MED ORDER — ALBUTEROL SULFATE (2.5 MG/3ML) 0.083% IN NEBU: 2.5000 mg | INHALATION_SOLUTION | Freq: Four times a day (QID) | RESPIRATORY_TRACT | Status: DC

## 2019-07-14 MED ORDER — ENOXAPARIN SODIUM 40 MG/0.4ML ~~LOC~~ SOLN
40.0000 mg | SUBCUTANEOUS | Status: DC
  Administered 2019-07-14 – 2019-07-20 (×7): 40 mg via SUBCUTANEOUS
  Filled 2019-07-14 (×7): qty 0.4

## 2019-07-14 MED ORDER — ONDANSETRON HCL 4 MG PO TABS: 4.0000 mg | ORAL_TABLET | Freq: Four times a day (QID) | ORAL | Status: DC | PRN

## 2019-07-14 MED ORDER — ONDANSETRON HCL 4 MG/2ML IJ SOLN: 4.0000 mg | Freq: Four times a day (QID) | INTRAMUSCULAR | Status: DC | PRN

## 2019-07-14 MED ORDER — SODIUM CHLORIDE 0.9 % IV SOLN: INTRAVENOUS | Status: DC

## 2019-07-14 MED ORDER — VANCOMYCIN HCL IN DEXTROSE 1-5 GM/200ML-% IV SOLN
1000.0000 mg | Freq: Three times a day (TID) | INTRAVENOUS | Status: DC
  Administered 2019-07-15 (×2): 1000 mg via INTRAVENOUS
  Filled 2019-07-14 (×2): qty 200

## 2019-07-14 MED ORDER — ALBUTEROL SULFATE (2.5 MG/3ML) 0.083% IN NEBU: 2.5000 mg | INHALATION_SOLUTION | RESPIRATORY_TRACT | Status: DC | PRN

## 2019-07-14 MED ORDER — SODIUM CHLORIDE (PF) 0.9 % IJ SOLN
INTRAMUSCULAR | Status: AC
  Filled 2019-07-14: qty 50

## 2019-07-14 MED ORDER — IPRATROPIUM-ALBUTEROL 0.5-2.5 (3) MG/3ML IN SOLN
3.0000 mL | Freq: Four times a day (QID) | RESPIRATORY_TRACT | Status: DC
  Administered 2019-07-14 – 2019-07-15 (×2): 3 mL via RESPIRATORY_TRACT
  Filled 2019-07-14 (×3): qty 3

## 2019-07-14 MED ORDER — SODIUM CHLORIDE 0.9 % IV SOLN
2.0000 g | Freq: Three times a day (TID) | INTRAVENOUS | Status: AC
  Administered 2019-07-14 – 2019-07-19 (×15): 2 g via INTRAVENOUS
  Filled 2019-07-14 (×15): qty 2

## 2019-07-14 MED ORDER — IOHEXOL 350 MG/ML SOLN
100.0000 mL | Freq: Once | INTRAVENOUS | Status: AC | PRN
  Administered 2019-07-14: 11:00:00 100 mL via INTRAVENOUS

## 2019-07-14 MED ORDER — IPRATROPIUM BROMIDE 0.02 % IN SOLN: 0.5000 mg | Freq: Four times a day (QID) | RESPIRATORY_TRACT | Status: DC

## 2019-07-14 MED ORDER — ACETAMINOPHEN 650 MG RE SUPP: 650.0000 mg | Freq: Four times a day (QID) | RECTAL | Status: DC | PRN

## 2019-07-14 MED ORDER — CHLORHEXIDINE GLUCONATE CLOTH 2 % EX PADS
6.0000 | MEDICATED_PAD | Freq: Every day | CUTANEOUS | Status: DC
  Administered 2019-07-14 – 2019-07-21 (×8): 6 via TOPICAL

## 2019-07-14 MED ORDER — GUAIFENESIN ER 600 MG PO TB12
600.0000 mg | ORAL_TABLET | Freq: Two times a day (BID) | ORAL | Status: DC
  Administered 2019-07-14 – 2019-07-21 (×14): 600 mg via ORAL
  Filled 2019-07-14 (×14): qty 1

## 2019-07-14 MED ORDER — VANCOMYCIN HCL 1750 MG/350ML IV SOLN
1750.0000 mg | INTRAVENOUS | Status: AC
  Administered 2019-07-14: 16:00:00 1750 mg via INTRAVENOUS
  Filled 2019-07-14: qty 350

## 2019-07-14 MED ORDER — ORAL CARE MOUTH RINSE
15.0000 mL | Freq: Two times a day (BID) | OROMUCOSAL | Status: DC
  Administered 2019-07-14 – 2019-07-21 (×13): 15 mL via OROMUCOSAL

## 2019-07-14 MED ORDER — ACETAMINOPHEN 325 MG PO TABS
650.0000 mg | ORAL_TABLET | Freq: Four times a day (QID) | ORAL | Status: DC | PRN
  Administered 2019-07-15 – 2019-07-17 (×2): 650 mg via ORAL
  Filled 2019-07-14 (×2): qty 2

## 2019-07-14 MED ORDER — METHYLPREDNISOLONE SODIUM SUCC 125 MG IJ SOLR
125.0000 mg | Freq: Once | INTRAMUSCULAR | Status: AC
  Administered 2019-07-14: 16:00:00 125 mg via INTRAVENOUS
  Filled 2019-07-14: qty 2

## 2019-07-14 NOTE — ED Notes (Addendum)
Milyssa, RN attempted report . Receiving RN in [place] room at this time.

## 2019-07-14 NOTE — ED Notes (Signed)
Respiratory notified need for BiPap

## 2019-07-14 NOTE — ED Triage Notes (Signed)
Per EMS-states SOB on and off since January-recent diagnosis of cancer-PET scan scheduled for today-mid 70's on 2 L at home-placed on 6 L [place]-SATS mid 80's-dim on left-patient states she feels better

## 2019-07-14 NOTE — ED Provider Notes (Signed)
[facility] EMERGENCY DEPARTMENT Provider Note  CSN: [id number] Arrival date & time: 07/14/19 4709    History Chief Complaint  Patient presents with  . Shortness of Breath    HPI  Sarah Chandler is a 34 y.o. female presents to the emerge department for evaluation of shortness of breath.  The patient is a very pleasant woman who was recently in the hospital for what was felt to be a multifocal pneumonia however with a bronchoscopy she was found to have poorly differentiated cancer cells.  She was hypoxic while in the hospital and was discharged with home oxygen however she states that she is she has been home in the last 5 to 6 days she still feels short of breath despite her home oxygen at 4 L/min.  She is continue to have a cough, no hemoptysis and no fever.  She denies any leg swelling, no nausea or vomiting.  She does report increase in her appetite since discharge.  She had an MRI of her brain which shows 4 small brain metastases, she was scheduled for a PET scan today for further staging and has not yet had the treatment plan formulated or initiated.   Past Medical History:  Diagnosis Date  . Cancer ([place])   . Pneumonia 05/08/2019    Past Surgical History:  Procedure Laterality Date  . BIOPSY  07/03/2019   Procedure: BIOPSY;  Surgeon: Rigoberto Noel, MD;  Location: [facility] ENDOSCOPY;  Service: Cardiopulmonary;;  . BRONCHIAL BRUSHINGS  07/03/2019   Procedure: BRONCHIAL BRUSHINGS;  Surgeon: Rigoberto Noel, MD;  Location: [facility] ENDOSCOPY;  Service: Cardiopulmonary;;  . BRONCHIAL WASHINGS  07/03/2019   Procedure: BRONCHIAL WASHINGS;  Surgeon: Rigoberto Noel, MD;  Location: [facility] ENDOSCOPY;  Service: Cardiopulmonary;;  . NO PAST SURGERIES    . TOOTH EXTRACTION    . VIDEO BRONCHOSCOPY N/A 07/03/2019   Procedure: VIDEO BRONCHOSCOPY WITH FLUORO;  Surgeon: Rigoberto Noel, MD;  Location: [facility];  Service: Cardiopulmonary;  Laterality: N/A;  LMA    Family History  Problem Relation Age of  Onset  . Diabetes Father   . Hyperlipidemia Father   . Hypertension Father   . Depression Maternal Uncle   . Diabetes Maternal Uncle   . Cancer Maternal Grandmother   . Hyperlipidemia Maternal Grandmother   . Cancer Paternal Grandfather     Social History   Tobacco Use  . Smoking status: Never Smoker  . Smokeless tobacco: Never Used  Substance Use Topics  . Alcohol use: Never  . Drug use: Never     Home Medications Prior to Admission medications   Medication Sig Start Date End Date Taking? Authorizing Provider  acetaminophen (TYLENOL) 500 MG tablet Take 1,000 mg by mouth every 6 (six) hours as needed for moderate pain or headache.    [provider]  albuterol (VENTOLIN HFA) 108 (90 Base) MCG/ACT inhaler Inhale 2 puffs into the lungs every 6 (six) hours as needed for wheezing or shortness of breath. 07/08/19   Amin, Jeanella Flattery, MD  guaifenesin (ROBITUSSIN) 100 MG/5ML syrup Take 200 mg by mouth 3 (three) times daily as needed for cough.    [provider]  hydrOXYzine (ATARAX/VISTARIL) 25 MG tablet Take 1 tablet (25 mg total) by mouth 3 (three) times daily as needed for anxiety. 07/08/19   Amin, Jeanella Flattery, MD  methimazole (TAPAZOLE) 5 MG tablet Take 1 tablet (5 mg total) by mouth 3 (three) times daily. 06/08/19   Shamleffer, Melanie Crazier, MD  Multiple Vitamin (MULTIVITAMIN WITH MINERALS) TABS tablet Take 2 tablets by mouth daily. Gummies    [provider]  OVER THE COUNTER MEDICATION Take 2 each by mouth daily as needed (anxiety). CBD gummies    [provider]  propranolol (INDERAL) 40 MG tablet Take 1 tablet (40 mg total) by mouth 3 (three) times daily. 06/08/19   Shamleffer, Melanie Crazier, MD     Allergies    Iron   Review of Systems   Review of Systems  Constitutional: Negative for fever.  HENT: Negative for congestion and sore throat.   Respiratory: Positive for cough and shortness of breath.   Cardiovascular: Negative for  chest pain.  Gastrointestinal: Negative for abdominal pain, diarrhea, nausea and vomiting.  Genitourinary: Negative for dysuria.  Musculoskeletal: Negative for myalgias.  Skin: Negative for rash.  Neurological: Negative for headaches.  Psychiatric/Behavioral: Negative for behavioral problems.     Physical Exam BP 135/86 (BP Location: Left Arm)   Pulse 100   Temp 98.2 F (36.8 C) (Oral)   Resp (!) 22   Ht 5\' 10"  (1.778 m)   Wt 86.6 kg   LMP 06/24/2019   SpO2 (!) 82%   BMI 27.41 kg/m   Physical Exam Constitutional:      Appearance: Normal appearance.  HENT:     Head: Normocephalic and atraumatic.     Nose: Nose normal.     Mouth/Throat:     Mouth: Mucous membranes are moist.  Eyes:     Extraocular Movements: Extraocular movements intact.     Conjunctiva/sclera: Conjunctivae normal.  Cardiovascular:     Rate and Rhythm: Normal rate.  Pulmonary:     Effort: Pulmonary effort is normal.     Breath sounds: Rales (Bilateral bases) present.  Abdominal:     General: Abdomen is flat.     Palpations: Abdomen is soft.     Tenderness: There is no abdominal tenderness.  Musculoskeletal:        General: No swelling. Normal range of motion.     Cervical back: Neck supple.  Skin:    General: Skin is warm and dry.  Neurological:     General: No focal deficit present.     Mental Status: She is alert.  Psychiatric:        Mood and Affect: Mood normal.      ED Results / Procedures / Treatments   Labs (all labs ordered are listed, but only abnormal results are displayed) Labs Reviewed  COMPREHENSIVE METABOLIC PANEL - Abnormal; Notable for the following components:      Result Value   Glucose, Bld 107 (*)    Calcium 8.8 (*)    Albumin 2.7 (*)    All other components within normal limits  CBC WITH DIFFERENTIAL/PLATELET - Abnormal; Notable for the following components:   WBC 18.2 (*)    Hemoglobin 11.5 (*)    RDW 16.0 (*)    Platelets 620 (*)    Neutro Abs 14.3 (*)     Monocytes Absolute 1.5 (*)    Abs Immature Granulocytes 0.09 (*)    All other components within normal limits  BLOOD GAS, ARTERIAL - Abnormal; Notable for the following components:   pO2, Arterial 68.4 (*)    All other components within normal limits  CULTURE, BLOOD (ROUTINE X 2)  CULTURE, BLOOD (ROUTINE X 2)  BRAIN NATRIURETIC PEPTIDE  POC SARS CORONAVIRUS 2 AG -  ED  TROPONIN I (HIGH SENSITIVITY)  TROPONIN I (HIGH SENSITIVITY)  EKG EKG Interpretation  Date/Time:  Tuesday July 14 2019 08:47:57 EDT Ventricular Rate:  99 PR Interval:    QRS Duration: 98 QT Interval:  342 QTC Calculation: 439 R Axis:   -21 Text Interpretation: Sinus rhythm Borderline left axis deviation RSR' in V1 or V2, probably normal variant No significant change since last tracing Confirmed by [facility]  MD, Furkan Keenum [phone]) on 07/14/2019 9:13:02 AM   Radiology DG Chest Port 1 View  Result Date: 07/14/2019 CLINICAL DATA:  Shortness of breath.  Lung carcinoma EXAM: PORTABLE CHEST 1 VIEW COMPARISON:  Chest CT July 03, 2019 and CT angiogram chest July 01, 2019 FINDINGS: There is widespread airspace opacity throughout the lungs bilaterally with slight progression in consolidation in the lower lung regions compared to most recent study. There is airspace opacity adjacent to the left superior hilum, stable. There are fairly small pleural effusions as well. The heart size is normal. Pulmonary vascularity is grossly normal. No adenopathy evident by radiography. No bone lesions appreciable by radiography. IMPRESSION: Widespread airspace opacity consistent with multifocal pneumonia. Pleural effusions present. It should be noted that an underlying parenchymal neoplastic involvement in the lungs cannot be excluded, particularly given findings on recent CT. Stable cardiac silhouette. No adenopathy demonstrable by radiography; see prior chest CT report, however. Electronically Signed   By: Lowella Grip III M.D.   On: 07/14/2019  09:28    Procedures .Critical Care Performed by: Truddie Hidden, MD Authorized by: Truddie Hidden, MD   Critical care provider statement:    Critical care time (minutes):  35   Critical care time was exclusive of:  Separately billable procedures and treating other patients   Critical care was necessary to treat or prevent imminent or life-threatening deterioration of the following conditions:  Respiratory failure   Critical care was time spent personally by me on the following activities:  Development of treatment plan with patient or surrogate, discussions with consultants, evaluation of patient's response to treatment, examination of patient, obtaining history from patient or surrogate, ordering and performing treatments and interventions, ordering and review of laboratory studies, ordering and review of radiographic studies, pulse oximetry, re-evaluation of patient's condition and review of old charts    Medications Ordered in the ED Medications  iohexol (OMNIPAQUE) 350 MG/ML injection 100 mL (has no administration in time range)     ED Course  I have reviewed the triage vital signs and the nursing notes.  Pertinent labs & imaging results that were available during my care of the patient were reviewed by me and considered in my medical decision making (see chart for details).  Clinical Course as of Jul 14 1054  Tue Jul 14, 2019  0932 Spoke with RT. Patient's NRB was not turned all the way up to 15L. With that change she is now 96%. CXR images reviewed, I do not see a large effusion or pneumothorax.    [CS]  0998 WBC is elevated of unclear etiology. She is not taking steroids. Blood cultures added.    [CS]  1012 No concerning findings on CMP. Will discuss admission with the hospitalist.    [CS]  1039 Spoke with Dr. Olevia Bowens, Hospitalists, who will admit. He requests a repeat CTA chest to further characterize her lung tissue.    [CS]    Clinical Course User Index [CS]  Truddie Hidden, MD    MDM Rules/Calculators/A&P MDM Number of Diagnoses or Management Options Diagnosis management comments: Patient here with worsening shortness of breath in the  setting of recently diagnosed lung cancer and acute on chronic hypoxic respiratory failure.  Her SpO2 was noted to be in the 70s at home by EMS.  She was placed on a nonrebreather on arrival to the ED and still has oxygen sats of 88%.  Lab test and chest x-ray were ordered to evaluate the above complaint.  Differential diagnosis includes worsening lung cancer, postobstructive pneumonia, pleural effusion, pulmonary embolus.  Patient be placed on BiPAP to see if this helps improve her oxygenation.  I discussed whether the patient would want [place] to proceed with intubation if necessary and she states she wanted to see how she did on BiPAP before making that decision.    Amount and/or Complexity of Data Reviewed Clinical lab tests: ordered and reviewed Tests in the radiology section of CPT: ordered and reviewed Review and summarize past medical records: yes Independent visualization of images, tracings, or specimens: yes  Risk of Complications, Morbidity, and/or Mortality Presenting problems: high Diagnostic procedures: high Management options: high    Final Clinical Impression(s) / ED Diagnoses Final diagnoses:  Acute on chronic respiratory failure with hypoxia [facility])    Rx / DC Orders ED Discharge Orders    None       Truddie Hidden, MD 07/14/19 1056

## 2019-07-14 NOTE — ED Notes (Signed)
ED Provider at bedside. 

## 2019-07-14 NOTE — H&P (Signed)
History and Physical    [name] WCH:[id number] DOB: 03-Oct-1985 DOA: 07/14/2019  PCP: Mellody Dance, DO  Patient coming from: Home.  I have personally briefly reviewed patient's old medical records in [place]  Chief Complaint: Shortness of breath  HPI: Sarah Chandler is a 34 y.o. female with medical history significant of lung adenocarcinoma, recently discharged for community-acquired pneumonia who is coming to the emergency department via EMS with progressively worse dyspnea.  She has had chills and night sweats.  She denies she was admitted for a week from 07/01/2019 until 07/08/2019 due to multifocal pneumonia and was discharged home on oxygen.  She continues to have productive cough, fatigue and malaise.  She denies fever or hemoptysis.  She denies headache, sore throat, rhinorrhea, chest pain, palpitations, dizziness, diaphoresis, PND, pitting edema of the lower extremities, abdominal pain, nausea, vomiting, diarrhea, constipation, melena or hematochezia.  No dysuria, frequency or materia.  No polyuria, polydipsia, polyphagia or blurred vision.  ED Course: Initial vital signs temperature 98.2 F, pulse 100, respirations 22, blood pressure 135/86 mmHg and O2 sat 82% on 6 LPM via [place].  The patient subsequently did require NRB mask 15 L/min.  ABG showed a PO2 of 68.4 on 100% oxygen, the rest of the values are within normal range.  CBC shows a white count of 18.2 with 78% neutrophils 11% lymphocytes and 8% monocytes.  Hemoglobin was 11.5 g/dL and platelets 620.  SARS antigen was negative.  PCR still pending.  CMP shows a glucose 107 mg/dL and albumin 2.7 g/dL.  The rest of the CMP values are normal when calcium is corrected to albumin.  Troponin x2 and BNP were normal.  Procalcitonin, magnesium and phosphorus were within expected values.  Imaging: There is widespread airspace disease consistent with multifocal pneumonia.  There is bilateral pleural effusions.  There is an  underlying parenchymal neoplastic involvement in the lungs cannot be excluded, given findings on recent CT.  No cardiomegaly.  New CTA chest was done which did not show any PE.  There was interval progression on extensive multifocal airspace opacities and consolidations.  Likely multifocal pneumonia however malignancy progression could not be excluded.  Please see images and full radiology report for further detail.  Review of Systems: As per HPI otherwise 10 point review of systems negative.   Past Medical History:  Diagnosis Date  . Cancer ([place])   . Pneumonia 05/08/2019    Past Surgical History:  Procedure Laterality Date  . BIOPSY  07/03/2019   Procedure: BIOPSY;  Surgeon: Rigoberto Noel, MD;  Location: [facility] ENDOSCOPY;  Service: Cardiopulmonary;;  . BRONCHIAL BRUSHINGS  07/03/2019   Procedure: BRONCHIAL BRUSHINGS;  Surgeon: Rigoberto Noel, MD;  Location: [facility] ENDOSCOPY;  Service: Cardiopulmonary;;  . BRONCHIAL WASHINGS  07/03/2019   Procedure: BRONCHIAL WASHINGS;  Surgeon: Rigoberto Noel, MD;  Location[facility] ENDOSCOPY;  Service: Cardiopulmonary;;  . NO PAST SURGERIES    . TOOTH EXTRACTION    . VIDEO BRONCHOSCOPY N/A 07/03/2019   Procedure: VIDEO BRONCHOSCOPY WITH FLUORO;  Surgeon: Rigoberto Noel, MD;  Location: [place];  Service: Cardiopulmonary;  Laterality: N/A;  LMA     reports that she has never smoked. She has never used smokeless tobacco. She reports that she does not drink alcohol or use drugs.  Allergies  Allergen Reactions  . Iron Other (See Comments)    Ferrosol - Liquid  Unknown reaction     Family History  Problem Relation Age of Onset  .  Diabetes Father   . Hyperlipidemia Father   . Hypertension Father   . Depression Maternal Uncle   . Diabetes Maternal Uncle   . Cancer Maternal Grandmother   . Hyperlipidemia Maternal Grandmother   . Cancer Paternal Grandfather    Prior to Admission medications   Medication Sig Start Date End Date Taking? Authorizing Provider    acetaminophen (TYLENOL) 500 MG tablet Take 1,000 mg by mouth every 6 (six) hours as needed for moderate pain or headache.   Yes [provider]  albuterol (VENTOLIN HFA) 108 (90 Base) MCG/ACT inhaler Inhale 2 puffs into the lungs every 6 (six) hours as needed for wheezing or shortness of breath. 07/08/19  Yes Amin, Jeanella Flattery, MD  guaifenesin (ROBITUSSIN) 100 MG/5ML syrup Take 200 mg by mouth 3 (three) times daily as needed for cough.   Yes [provider]  hydrOXYzine (ATARAX/VISTARIL) 25 MG tablet Take 1 tablet (25 mg total) by mouth 3 (three) times daily as needed for anxiety. 07/08/19  Yes Amin, Jeanella Flattery, MD  methimazole (TAPAZOLE) 5 MG tablet Take 1 tablet (5 mg total) by mouth 3 (three) times daily. 06/08/19  Yes Shamleffer, Melanie Crazier, MD  Multiple Vitamin (MULTIVITAMIN WITH MINERALS) TABS tablet Take 2 tablets by mouth daily. Gummies   Yes [provider]  OVER THE COUNTER MEDICATION Take 2 each by mouth daily as needed (anxiety). CBD gummies   Yes [provider]  propranolol (INDERAL) 40 MG tablet Take 1 tablet (40 mg total) by mouth 3 (three) times daily. 06/08/19  Yes Shamleffer, Melanie Crazier, MD    Physical Exam: Vitals:   07/14/19 1430 07/14/19 1500 07/14/19 1502 07/14/19 1630  BP: 113/69 (!) 128/59 116/72 125/72  Pulse: (!) 103 (!) 105 (!) 106 (!) 102  Resp: (!) 35 (!) 34 (!) 32 (!) 31  Temp:      TempSrc:      SpO2: 96% 93% 93% 95%  Weight:      Height:        Constitutional: NAD, calm, comfortable Eyes: PERRL, lids and conjunctivae normal ENMT: Mucous membranes are mildly dry. Posterior pharynx clear of any exudate or lesions. Neck: normal, supple, no masses, no thyromegaly Respiratory: Tachypneic, no accessory muscle use.  Mild bilateral wheezing, mild rhonchi, scattered crackles.  Cardiovascular: Tachycardic in the low 100s, no murmurs / rubs / gallops. No extremity edema. 2+ pedal pulses. No carotid bruits.  Abdomen:  Nondistended.  Soft, no tenderness, no masses palpated. No hepatosplenomegaly. Bowel sounds positive.  Musculoskeletal: no clubbing / cyanosis. Good ROM, no contractures. Normal muscle tone.  Skin: no rashes, lesions, ulcers on limited dermatological examination. Neurologic: CN 2-12 grossly intact. Sensation intact, DTR normal. Strength 5/5 in all 4.  Psychiatric: Normal judgment and insight. Alert and oriented x 3. Normal mood.   Labs on Admission: I have personally reviewed following labs and imaging studies  CBC: Recent Labs  Lab 07/08/19 0319 07/14/19 0915  WBC 12.2* 18.2*  NEUTROABS  --  14.3*  HGB 10.9* 11.5*  HCT 35.8* 37.2  MCV 87.1 87.5  PLT 541* 119*   Basic Metabolic Panel: Recent Labs  Lab 07/08/19 0319 07/14/19 0915 07/14/19 1126  NA 137 136  --   K 4.0 4.3  --   CL 101 103  --   CO2 25 22  --   GLUCOSE 89 107*  --   BUN <5* 7  --   CREATININE 0.62 0.57  --   CALCIUM 8.7* 8.8*  --  MG 1.9  --  1.9  PHOS  --   --  4.2   GFR: Estimated Creatinine Clearance: 120.6 mL/min (by C-G formula based on SCr of 0.57 mg/dL). Liver Function Tests: Recent Labs  Lab 07/14/19 0915  AST 27  ALT 26  ALKPHOS 89  BILITOT 0.6  PROT 7.6  ALBUMIN 2.7*   No results for input(s): LIPASE, AMYLASE in the last 168 hours. No results for input(s): AMMONIA in the last 168 hours. Coagulation Profile: No results for input(s): INR, PROTIME in the last 168 hours. Cardiac Enzymes: No results for input(s): CKTOTAL, CKMB, CKMBINDEX, TROPONINI in the last 168 hours. BNP (last 3 results) No results for input(s): PROBNP in the last 8760 hours. HbA1C: No results for input(s): HGBA1C in the last 72 hours. CBG: No results for input(s): GLUCAP in the last 168 hours. Lipid Profile: No results for input(s): CHOL, HDL, LDLCALC, TRIG, CHOLHDL, LDLDIRECT in the last 72 hours. Thyroid Function Tests: No results for input(s): TSH, T4TOTAL, FREET4, T3FREE, THYROIDAB in the last 72  hours. Anemia Panel: No results for input(s): VITAMINB12, FOLATE, FERRITIN, TIBC, IRON, RETICCTPCT in the last 72 hours. Urine analysis:    Component Value Date/Time   COLORURINE STRAW (A) 07/01/2019 2324   APPEARANCEUR CLEAR 07/01/2019 2324   LABSPEC 1.017 07/01/2019 2324   PHURINE 6.0 07/01/2019 2324   GLUCOSEU NEGATIVE 07/01/2019 2324   HGBUR NEGATIVE 07/01/2019 2324   BILIRUBINUR NEGATIVE 07/01/2019 2324   KETONESUR NEGATIVE 07/01/2019 2324   PROTEINUR NEGATIVE 07/01/2019 2324   NITRITE NEGATIVE 07/01/2019 2324   LEUKOCYTESUR NEGATIVE 07/01/2019 2324    Radiological Exams on Admission: CT Angio Chest PE W/Cm &/Or Wo Cm  Result Date: 07/14/2019 CLINICAL DATA:  Shortness of breath, recent diagnosis of lung cancer, COVID negative EXAM: CT ANGIOGRAPHY CHEST WITH CONTRAST TECHNIQUE: Multidetector CT imaging of the chest was performed using the standard protocol during bolus administration of intravenous contrast. Multiplanar CT image reconstructions and MIPs were obtained to evaluate the vascular anatomy. CONTRAST:  18mL OMNIPAQUE IOHEXOL 350 MG/ML SOLN COMPARISON:  Radiograph same day, CT chest July 01, 2019 FINDINGS: Cardiovascular: There is a optimal opacification of the pulmonary arteries. There is no central,segmental, or subsegmental filling defects within the pulmonary arteries. The heart is normal in size. A trace pericardial effusion/thickening is seen. No evidence right heart strain. There is normal three-vessel brachiocephalic anatomy without proximal stenosis. The thoracic aorta is normal in appearance. Mediastinum/Nodes: Slight interval progression in the scattered mediastinal and bilateral axillary adenopathy. For example within the left axilla there is a 1.2 cm lymph node, series 4, image 32 and previously 1 cm. There is also 1.0 cm subcarinal lymph node which was previously 8 mm. Lungs/Pleura: There is interval progression in the multifocal extensive airspace patchy opacities  throughout both lungs. There are areas of dense consolidation seen within the left lung base. Small bilateral pleural effusions are present, right greater than left. Upper Abdomen: Again noted are scattered left-sided para aortic lymph nodes. Musculoskeletal: Again noted are numerous tiny sclerotic foci within the visualized axial and appendicular skeleton. No destructive osseous lesion or fracture is identified. Review of the MIP images confirms the above findings. IMPRESSION: 1. No central, segmental, or subsegmental pulmonary embolism. 2. Interval progression in extensive multifocal airspace opacities and consolidation, likely multifocal pneumonia however cannot exclude interval progression in the patient's known primary lung neoplasm with metastatic disease. 3. Small bilateral pleural effusions, left greater than right 4. Mediastinal and bilateral axillary adenopathy, with slight interval progression  Electronically Signed   By: Prudencio Pair M.D.   On: 07/14/2019 11:40   DG Chest Port 1 View  Result Date: 07/14/2019 CLINICAL DATA:  Shortness of breath.  Lung carcinoma EXAM: PORTABLE CHEST 1 VIEW COMPARISON:  Chest CT July 03, 2019 and CT angiogram chest July 01, 2019 FINDINGS: There is widespread airspace opacity throughout the lungs bilaterally with slight progression in consolidation in the lower lung regions compared to most recent study. There is airspace opacity adjacent to the left superior hilum, stable. There are fairly small pleural effusions as well. The heart size is normal. Pulmonary vascularity is grossly normal. No adenopathy evident by radiography. No bone lesions appreciable by radiography. IMPRESSION: Widespread airspace opacity consistent with multifocal pneumonia. Pleural effusions present. It should be noted that an underlying parenchymal neoplastic involvement in the lungs cannot be excluded, particularly given findings on recent CT. Stable cardiac silhouette. No adenopathy demonstrable  by radiography; see prior chest CT report, however. Electronically Signed   By: Lowella Grip III M.D.   On: 07/14/2019 09:28    EKG: Independently reviewed. Vent. rate 99 BPM PR interval * ms QRS duration 98 ms QT/QTc 342/439 ms P-R-T axes 30 -21 2 Sinus rhythm Borderline left axis deviation RSR' in V1 or V2, probably normal variant No significant change since last tracing  Assessment/Plan Principal Problem:   HCAP (healthcare-associated pneumonia) Admit to stepdown/inpatient. Continue supplemental oxygen. Continue bronchodilators. Solu-Medrol 125 mg IVP x1 dose. Vancomycin per pharmacy. Cefepime per pharmacy. Check strep pneumoniae urinary antigen. Check sputum culture. Follow-up blood culture and sensitivity.  Active Problems:   Depression   GAD (generalized anxiety disorder) Hydroxyzine as needed. Consider SSRI.    Hyperthyroidism  Continue Tapazole. Continue propranolol.    Primary lung adenocarcinoma ([place]) She was scheduled for PET scan today. She is scheduled to see oncology on Thursday. May need oncology inpatient evaluation.    Normocytic anemia Iron deficient on previous admission anemia panel. Monitor hematocrit and hemoglobin.    DVT prophylaxis: Lovenox SQ Code Status: Full code. Family Communication: Disposition Plan: Admit for IV antibiotics for 2 to 3 days. Consults called:  Admission status: Inpatient/stepdown.   Reubin Milan MD [facility]  If 7PM-7AM, please contact night-coverage [email or web address]  07/14/2019, 4:56 PM   This document was prepared using Dragon voice recognition software and may contain some unintended transcription errors.

## 2019-07-14 NOTE — ED Notes (Addendum)
PATIENT PRESENTED TO EMERGENCY ROOM ON 6 LITERS OF OXYGEN THERAPY O2 VIA NASAL CANNULA STATS WERE 82%, SHE WAS PLACED ON NON-REBREATHER @ 14 LITERS OF OXYGEN THERAPY OSTATS WERE 88%. MD SHELDON NOTIFIED ALONG WITH RESPIRATORY.

## 2019-07-14 NOTE — ED Notes (Signed)
Save and hold Blood culture bottles collected from [facility] sent to lab

## 2019-07-14 NOTE — ED Notes (Signed)
Pt transported to CT at this time.

## 2019-07-14 NOTE — Progress Notes (Signed)
Pharmacy Antibiotic Note  Sarah Chandler is a 34 y.o. female admitted on 07/14/2019 with pneumonia.  Pharmacy has been consulted for Vancomycin dosing. Patient also placed on Cefepime per MD.   Plan: Vancomycin 1750mg  IV x 1, then 1g IV q8h. Vancomycin levels at steady state, as indicated. Cefepime 2g IV q8h per MD.  Monitor renal function, cultures, clinical course.   Height: 5\' 10"  (177.8 cm) Weight: 86.6 kg (191 lb) IBW/kg (Calculated) : 68.5  Temp (24hrs), Avg:98.2 F (36.8 C), Min:98.2 F (36.8 C), Max:98.2 F (36.8 C)  Recent Labs  Lab 07/08/19 0319 07/14/19 0915  WBC 12.2* 18.2*  CREATININE 0.62 0.57    Estimated Creatinine Clearance: 120.6 mL/min (by C-G formula based on SCr of 0.57 mg/dL).    Allergies  Allergen Reactions  . Iron Other (See Comments)    Ferrosol - Liquid  Unknown reaction     Antimicrobials this admission: 4/6 Vancomycin >> 4/6 Cefepime >>  Dose adjustments this admission: --  Microbiology results: 4/6 BCx: sent 4/6 COVID: sent 4/6 Sputum: ordered 4/6 Strep pneumo ur ag: ordered   Thank you for allowing pharmacy to be a part of this patient's care.  Luiz Ochoa 07/14/2019 3:25 PM

## 2019-07-15 DIAGNOSIS — J9621 Acute and chronic respiratory failure with hypoxia: Secondary | ICD-10-CM

## 2019-07-15 DIAGNOSIS — C3490 Malignant neoplasm of unspecified part of unspecified bronchus or lung: Secondary | ICD-10-CM

## 2019-07-15 LAB — CBC WITH DIFFERENTIAL/PLATELET
Abs Immature Granulocytes: 0.04 10*3/uL (ref 0.00–0.07)
Basophils Absolute: 0 10*3/uL (ref 0.0–0.1)
Basophils Relative: 0 %
Eosinophils Absolute: 0 10*3/uL (ref 0.0–0.5)
Eosinophils Relative: 0 %
HCT: 35.4 % — ABNORMAL LOW (ref 36.0–46.0)
Hemoglobin: 10.7 g/dL — ABNORMAL LOW (ref 12.0–15.0)
Immature Granulocytes: 0 %
Lymphocytes Relative: 14 %
Lymphs Abs: 1.5 10*3/uL (ref 0.7–4.0)
MCH: 26.9 pg (ref 26.0–34.0)
MCHC: 30.2 g/dL (ref 30.0–36.0)
MCV: 88.9 fL (ref 80.0–100.0)
Monocytes Absolute: 0.3 10*3/uL (ref 0.1–1.0)
Monocytes Relative: 3 %
Neutro Abs: 8.7 10*3/uL — ABNORMAL HIGH (ref 1.7–7.7)
Neutrophils Relative %: 83 %
Platelets: 561 10*3/uL — ABNORMAL HIGH (ref 150–400)
RBC: 3.98 MIL/uL (ref 3.87–5.11)
RDW: 15.9 % — ABNORMAL HIGH (ref 11.5–15.5)
WBC: 10.6 10*3/uL — ABNORMAL HIGH (ref 4.0–10.5)
nRBC: 0 % (ref 0.0–0.2)

## 2019-07-15 LAB — BASIC METABOLIC PANEL
Anion gap: 10 (ref 5–15)
BUN: 8 mg/dL (ref 6–20)
CO2: 21 mmol/L — ABNORMAL LOW (ref 22–32)
Calcium: 8.9 mg/dL (ref 8.9–10.3)
Chloride: 108 mmol/L (ref 98–111)
Creatinine, Ser: 0.39 mg/dL — ABNORMAL LOW (ref 0.44–1.00)
GFR calc Af Amer: >60 mL/min (ref >60)
GFR calc non Af Amer: >60 mL/min (ref >60)
Glucose, Bld: 130 mg/dL — ABNORMAL HIGH (ref 70–99)
Potassium: 4 mmol/L (ref 3.5–5.1)
Sodium: 139 mmol/L (ref 135–145)

## 2019-07-15 LAB — STREP PNEUMONIAE URINARY ANTIGEN: Strep Pneumo Urinary Antigen: NEGATIVE

## 2019-07-15 MED ORDER — METHIMAZOLE 5 MG PO TABS
5.0000 mg | ORAL_TABLET | Freq: Three times a day (TID) | ORAL | Status: DC
  Administered 2019-07-15 – 2019-07-21 (×20): 5 mg via ORAL
  Filled 2019-07-15 (×21): qty 1

## 2019-07-15 MED ORDER — HYDROXYZINE HCL 25 MG PO TABS
25.0000 mg | ORAL_TABLET | Freq: Three times a day (TID) | ORAL | Status: DC | PRN
  Administered 2019-07-15 – 2019-07-17 (×5): 25 mg via ORAL
  Filled 2019-07-15 (×6): qty 1

## 2019-07-15 MED ORDER — IPRATROPIUM-ALBUTEROL 0.5-2.5 (3) MG/3ML IN SOLN
3.0000 mL | Freq: Three times a day (TID) | RESPIRATORY_TRACT | Status: DC
  Administered 2019-07-15 (×3): 3 mL via RESPIRATORY_TRACT
  Filled 2019-07-15 (×4): qty 3

## 2019-07-15 MED ORDER — PROPRANOLOL HCL 40 MG PO TABS
40.0000 mg | ORAL_TABLET | Freq: Three times a day (TID) | ORAL | Status: DC
  Administered 2019-07-15 – 2019-07-21 (×19): 40 mg via ORAL
  Filled 2019-07-15 (×21): qty 1

## 2019-07-15 NOTE — Progress Notes (Signed)
PROGRESS NOTE    DIANNE WHELCHEL  VEL:[id number] DOB: 1986/02/01 DOA: 07/14/2019 PCP: Mellody Dance, DO   Brief Narrative:  Sarah Chandler is a 34 y.o. female with medical history significant of lung adenocarcinoma, recently discharged for community-acquired pneumonia who is coming to the emergency department via EMS with progressively worse dyspnea.  She has had chills and night sweats.  She denies she was admitted for a week from 07/01/2019 until 07/08/2019 due to multifocal pneumonia and was discharged home on oxygen.  She continues to have productive cough, fatigue and malaise.  She denies fever or hemoptysis.  She denies headache, sore throat, rhinorrhea, chest pain, palpitations, dizziness, diaphoresis, PND, pitting edema of the lower extremities, abdominal pain, nausea, vomiting, diarrhea, constipation, melena or hematochezia.  No dysuria, frequency or materia.  No polyuria, polydipsia, polyphagia or blurred vision.  Patient admitted via the ED on 07/14/2019 for acute hypoxic respiratory failure in the setting of bilateral diffuse airspace disease concerning for acutely worsening adenocarcinoma versus possible underlying infection.   Assessment & Plan:   Principal Problem:   HCAP (healthcare-associated pneumonia) Active Problems:   Depression   Hyperthyroidism   GAD (generalized anxiety disorder)   Primary lung adenocarcinoma (HCC)   Normocytic anemia  Acute on subacute hypoxic respiratory failure, poa Concern for worsening adenocarcinoma of the lung Rule out infectious process underlying SpO2: 92 % O2 Flow Rate (L/min): 15 L/min Continue supportive care including bronchodilators, albuterol, steroids Unclear if this is truly infectious versus worsening adenocarcinoma as below. Patient currently on broad-spectrum antibiotics, is afebrile, with moderately elevated leukocytosis PCCM/pulmonology following, appreciate insight and recommendations about patient's imaging and guidance on  what appears to be worsening airspace disease in the setting of lung cancer Vancomycin/Cefepime at intake - MRSA nares negative, discontinue vancomycin Strep pneumoniae urinary antigen negative Procalcitonin < 0.10 favoring worsening adenocarcinoma over infection  Primary lung adenocarcinoma ([place]) She was scheduled for PET scan 07/14/19 She is scheduled to see oncology on Thursday with Dr. Earlie Server Discussed the case with Dr. Earlie Server, he is awaiting further genetic markers and lab work to further delineate patient's treatment plan, we have asked that he evaluate the patient while inpatient given her worsening respiratory status and concern that this is acutely related to her worsening adenocarcinoma given infectious process as above is less than convincing  Depression GAD (generalized anxiety disorder) Resume home medications including hydroxyzine  Hyperthyroidism  Continue home medications including Tapazole, and propranolol.  Normocytic anemia Iron deficient on previous admission anemia panel. Monitor hematocrit and hemoglobin.  Thrombocytosis Likely reactive given above, follow with morning labs   DVT prophylaxis: Lovenox SQ Code Status: Full code. Family Communication: Disposition Plan:  Remains as inpatient, worsening hypoxic respiratory failure in the setting of primary lung adenocarcinoma as above, currently ruling out infectious process underlying.  Patient's markedly abnormal CT chest expect prolonged hospital stay and marked hypoxia requiring close observation further evaluation and possible procedure pending specialist evaluation Consults called: PCCM, oncology Admission status: Inpatient/stepdown  Procedures:   None  Antimicrobials:  Cefepime 07/14/19-->ongoing Vancomycin initiated 07/14/2019 --> discontinued 07/15/2019  Subjective: No acute issues or events overnight, patient's respiratory status appears to be improving per herself but she is requiring upwards of 15  L nasal cannula high flow, she is holding a nonrebreather and her hand for supplemental oxygen as needed for dyspnea.  She declines nausea, vomiting, diarrhea, constipation, headache, fevers, chills; but indicates her breathing is nowhere near baseline and worse with exertion.  Objective: Vitals:   07/15/19  5809 07/15/19 0400 07/15/19 0500 07/15/19 0600  BP:  (!) 148/82 (!) 143/84 (!) 155/72  Pulse:  (!) 108 (!) 105 (!) 111  Resp:  (!) 32 (!) 33 (!) 35  Temp: 97.9 F (36.6 C)     TempSrc: Oral     SpO2:  94% 98% 91%  Weight:      Height:        Intake/Output Summary (Last 24 hours) at 07/15/2019 0702 Last data filed at 07/15/2019 0630 Gross per 24 hour  Intake 1576.33 ml  Output 351 ml  Net 1225.33 ml   Filed Weights   07/14/19 0851  Weight: 86.6 kg    Examination:  General:  Pleasantly resting in bed, No acute distress. HEENT:  Normocephalic atraumatic.  Sclerae nonicteric, noninjected.  Extraocular movements intact bilaterally. Neck:  Without mass or deformity.  Trachea is midline. Lungs: Diffuse coarse breath sounds bilaterally, diminished breath sounds bibasilarly with end expiratory rhonchi biapically. Heart:  Regular rate and rhythm.  Without murmurs, rubs, or gallops. Abdomen:  Soft, nontender, nondistended.  Without guarding or rebound. Extremities: Without cyanosis, clubbing, edema, or obvious deformity. Vascular:  Dorsalis pedis and posterior tibial pulses palpable bilaterally. Skin:  Warm and dry, no erythema, no ulcerations.  Data Reviewed: I have personally reviewed following labs and imaging studies  CBC: Recent Labs  Lab 07/14/19 0915 07/15/19 0249  WBC 18.2* 10.6*  NEUTROABS 14.3* 8.7*  HGB 11.5* 10.7*  HCT 37.2 35.4*  MCV 87.5 88.9  PLT 620* 983*   Basic Metabolic Panel: Recent Labs  Lab 07/14/19 0915 07/14/19 1126 07/15/19 0249  NA 136  --  139  K 4.3  --  4.0  CL 103  --  108  CO2 22  --  21*  GLUCOSE 107*  --  130*  BUN 7  --  8    CREATININE 0.57  --  0.39*  CALCIUM 8.8*  --  8.9  MG  --  1.9  --   PHOS  --  4.2  --    GFR: Estimated Creatinine Clearance: 120.6 mL/min (A) (by C-G formula based on SCr of 0.39 mg/dL (L)). Liver Function Tests: Recent Labs  Lab 07/14/19 0915  AST 27  ALT 26  ALKPHOS 89  BILITOT 0.6  PROT 7.6  ALBUMIN 2.7*   No results for input(s): LIPASE, AMYLASE in the last 168 hours. No results for input(s): AMMONIA in the last 168 hours. Coagulation Profile: No results for input(s): INR, PROTIME in the last 168 hours. Cardiac Enzymes: No results for input(s): CKTOTAL, CKMB, CKMBINDEX, TROPONINI in the last 168 hours. BNP (last 3 results) No results for input(s): PROBNP in the last 8760 hours. HbA1C: No results for input(s): HGBA1C in the last 72 hours. CBG: No results for input(s): GLUCAP in the last 168 hours. Lipid Profile: No results for input(s): CHOL, HDL, LDLCALC, TRIG, CHOLHDL, LDLDIRECT in the last 72 hours. Thyroid Function Tests: No results for input(s): TSH, T4TOTAL, FREET4, T3FREE, THYROIDAB in the last 72 hours. Anemia Panel: No results for input(s): VITAMINB12, FOLATE, FERRITIN, TIBC, IRON, RETICCTPCT in the last 72 hours. Sepsis Labs: Recent Labs  Lab 07/14/19 1126  PROCALCITON <0.10    Recent Results (from the past 240 hour(s))  SARS CORONAVIRUS 2 (TAT 6-24 HRS) Nasopharyngeal Nasopharyngeal Swab     Status: None   Collection Time: 07/14/19  2:45 PM   Specimen: Nasopharyngeal Swab  Result Value Ref Range Status   SARS Coronavirus 2 NEGATIVE NEGATIVE Final  Comment: (NOTE) SARS-CoV-2 target nucleic acids are NOT DETECTED. The SARS-CoV-2 RNA is generally detectable in upper and lower respiratory specimens during the acute phase of infection. Negative results do not preclude SARS-CoV-2 infection, do not rule out co-infections with other pathogens, and should not be used as the sole basis for treatment or other patient management decisions. Negative  results must be combined with clinical observations, patient history, and epidemiological information. The expected result is Negative. Fact Sheet for Patients: SugarRoll.be Fact Sheet for Healthcare Providers: https://www.woods-mathews.com/ This test is not yet approved or cleared by the [place] FDA and  has been authorized for detection and/or diagnosis of SARS-CoV-2 by FDA under an Emergency Use Authorization (EUA). This EUA will remain  in effect (meaning this test can be used) for the duration of the COVID-19 declaration under Section 56 4(b)(1) of the Act, 21 U.S.C. section 360bbb-3(b)(1), unless the authorization is terminated or revoked sooner. Performed at [facility], [place] [address] [place], [place] [address]   MRSA PCR Screening     Status: None   Collection Time: 07/14/19  8:59 PM   Specimen: Nasal Mucosa; Nasopharyngeal  Result Value Ref Range Status   MRSA by PCR NEGATIVE NEGATIVE Final    Comment:        The GeneXpert MRSA Assay (FDA approved for NASAL specimens only), is one component of a comprehensive MRSA colonization surveillance program. It is not intended to diagnose MRSA infection nor to guide or monitor treatment for MRSA infections. Performed at [facility], [place] [address], [place], [place] [address]          Radiology Studies: CT Angio Chest PE W/Cm &/Or Wo Cm  Result Date: 07/14/2019 CLINICAL DATA:  Shortness of breath, recent diagnosis of lung cancer, COVID negative EXAM: CT ANGIOGRAPHY CHEST WITH CONTRAST TECHNIQUE: Multidetector CT imaging of the chest was performed using the standard protocol during bolus administration of intravenous contrast. Multiplanar CT image reconstructions and MIPs were obtained to evaluate the vascular anatomy. CONTRAST:  155mL OMNIPAQUE IOHEXOL 350 MG/ML SOLN COMPARISON:  Radiograph same day, CT chest July 01, 2019 FINDINGS: Cardiovascular:  There is a optimal opacification of the pulmonary arteries. There is no central,segmental, or subsegmental filling defects within the pulmonary arteries. The heart is normal in size. A trace pericardial effusion/thickening is seen. No evidence right heart strain. There is normal three-vessel brachiocephalic anatomy without proximal stenosis. The thoracic aorta is normal in appearance. Mediastinum/Nodes: Slight interval progression in the scattered mediastinal and bilateral axillary adenopathy. For example within the left axilla there is a 1.2 cm lymph node, series 4, image 32 and previously 1 cm. There is also 1.0 cm subcarinal lymph node which was previously 8 mm. Lungs/Pleura: There is interval progression in the multifocal extensive airspace patchy opacities throughout both lungs. There are areas of dense consolidation seen within the left lung base. Small bilateral pleural effusions are present, right greater than left. Upper Abdomen: Again noted are scattered left-sided para aortic lymph nodes. Musculoskeletal: Again noted are numerous tiny sclerotic foci within the visualized axial and appendicular skeleton. No destructive osseous lesion or fracture is identified. Review of the MIP images confirms the above findings. IMPRESSION: 1. No central, segmental, or subsegmental pulmonary embolism. 2. Interval progression in extensive multifocal airspace opacities and consolidation, likely multifocal pneumonia however cannot exclude interval progression in the patient's known primary lung neoplasm with metastatic disease. 3. Small bilateral pleural effusions, left greater than right 4. Mediastinal and bilateral axillary adenopathy, with  slight interval progression Electronically Signed   By: Prudencio Pair M.D.   On: 07/14/2019 11:40   DG Chest Port 1 View  Result Date: 07/14/2019 CLINICAL DATA:  Shortness of breath.  Lung carcinoma EXAM: PORTABLE CHEST 1 VIEW COMPARISON:  Chest CT July 03, 2019 and CT angiogram  chest July 01, 2019 FINDINGS: There is widespread airspace opacity throughout the lungs bilaterally with slight progression in consolidation in the lower lung regions compared to most recent study. There is airspace opacity adjacent to the left superior hilum, stable. There are fairly small pleural effusions as well. The heart size is normal. Pulmonary vascularity is grossly normal. No adenopathy evident by radiography. No bone lesions appreciable by radiography. IMPRESSION: Widespread airspace opacity consistent with multifocal pneumonia. Pleural effusions present. It should be noted that an underlying parenchymal neoplastic involvement in the lungs cannot be excluded, particularly given findings on recent CT. Stable cardiac silhouette. No adenopathy demonstrable by radiography; see prior chest CT report, however. Electronically Signed   By: Lowella Grip III M.D.   On: 07/14/2019 09:28        Scheduled Meds:  Chlorhexidine Gluconate Cloth  6 each Topical Daily   enoxaparin (LOVENOX) injection  40 mg Subcutaneous Q24H   guaiFENesin  600 mg Oral BID   ipratropium-albuterol  3 mL Nebulization TID   mouth rinse  15 mL Mouth Rinse BID   Continuous Infusions:  sodium chloride 88 mL/hr at 07/15/19 0500   ceFEPime (MAXIPIME) IV Stopped (07/15/19 0544)   vancomycin Stopped (07/15/19 0104)     LOS: 1 day    Time spent: 3min   Janda Cargo C Johnette Teigen, DO [facility]  If 7PM-7AM, please contact night-coverage [email or web address]  07/15/2019, 7:02 AM

## 2019-07-15 NOTE — Consult Note (Addendum)
NAME:  Sarah Chandler, MRN:  [id number], DOB:  Feb 20, 1986, LOS: 1 ADMISSION DATE:  07/14/2019, CONSULTATION DATE: 07/15/19 REFERRING MD:  Dr. Avon Gully, CHIEF COMPLAINT:  Shortness of breath    Brief History   34 y/o F with poorly differentiated adenocarcinoma of the lung admitted with worsening SOB.  History of present illness   34 y/o F who presented to [facility] on 4/6 with reports of ongoing dyspnea, fatigue, productive cough.    The patient began having shortness of breath, dry cough around November 2020.  She was admitted in January 2021 for pneumonia and treated accordingly. Infectious work up at that time was negative.  She was recently admitted from 3/24-3/31 for concerns of multifocal pneumonia & discharged home on O2.  CT Chest at that time showed progression of bilateral multifocal airspace disease with lymphadenopathy, small effusions. Bronchoscopy was performed on 3/26 with pathology findings of adenocarcinoma.       She returns 4/6 with increasing shortness of breath. The patient indicates she felt fine in the hospital on O2 but at home she did not feel as if she "got enough oxygen".  She reports ongoing fatigue, dyspnea with exertion.  Denies fevers, chills, n/v/d.  Indicates she has an aunt with adenocarcinoma who underwent immunotherapy.  Reports she quit smoking THC, had almost a daily habit for two years prior to admit. Patient remains on 15L HFNC.  CTA of the chest on admit was negative for PE but showed an interval progression of extensive multifocal airspace opacities, consolidation, small bilateral pleural effusions L>R, mediastinal and bilateral axillary adenopathy.   PCCM consulted for evaluation of hypoxemic respiratory failure in the setting of adenocarcinoma.   Past Medical History  Hyperthyroidism  Adenocarcinoma of the [place] Hospital Events   4/06 Admit with SOB, malaise, weakness 4/07 PCCM consulted   Consults:  PCCM ONC  Procedures:    Significant  Diagnostic Tests:   CTA Chest PE 4/6 >> negative for PE, interval progression in extensive multifocal airspace opacities and consolidation, small bilateral pleural effusions L>R, mediastinal & bilateral axillary adenopathy with slight progression   Micro Data:  COVID 4/6 >> negative  MRSA PCR 4/6 >> negative  BCx2 4/6 >>   Antimicrobials:  Vanco 4/6 >> 4/7 Cefepime 4/6 >>   Interim history/subjective:  Pt reports feeling better after sleeping last night.  Indicates shortness of breath with exertion.   Objective   Blood pressure (!) 155/72, pulse (!) 111, temperature 97.7 F (36.5 C), temperature source Oral, resp. rate (!) 35, height 5\' 10"  (1.778 m), weight 86.6 kg, last menstrual period 06/24/2019, SpO2 91 %.        Intake/Output Summary (Last 24 hours) at 07/15/2019 0905 Last data filed at 07/15/2019 0630 Gross per 24 hour  Intake 1576.33 ml  Output 351 ml  Net 1225.33 ml   Filed Weights   07/14/19 0851  Weight: 86.6 kg    Examination: General: young adult female sitting in bed in NAD   HEENT: MM pink/moist, [place] O2, good dentition  Neuro: AAOx4, speech clear, MAE, normal strength CV: s1s2 RRR, tachy, no m/r/g PULM: tachypnea but no distress, lungs bilaterally coarse with bibasilar crackles  GI: soft, bsx4 active  Extremities: warm/dry, no edema  Skin: no rashes or lesions  Resolved Hospital Problem list      Assessment & Plan:   Acute Hypoxemic Respiratory Failure in setting of Adenocarcinoma with Bilateral Infiltrates, Effusions Initial symptoms began Nov 2020, admit for PNA 04/2019, 06/2019  with diagnosis of poorly differentiated adenocarcinoma 3/26 by FOB.  Molecular genetics pending. Doubt bacterial component (PCT negative).  -wean O2 for sats >90% -encourage pulmonary hygiene -IS, mobilize -follow intermittent CXR  -await cultures  -follow up on molecular pathology -would like to start therapy as quickly as possible -appreciate ONC evaluation -continue  empiric therapy for possible bacterial component but suspect her symptoms are largely related to disease burden from adenocarcinoma. Stop vancomycin with neg PCR. -monitor pleural effusion size, if enlarging will need therapeutic thoracentesis for dyspnea relief  Best practice:  Diet: per primary  Pain/Anxiety/Delirium protocol (if indicated): per primary  VAP protocol (if indicated): n/a  DVT prophylaxis: per primary  GI prophylaxis: n/a  Glucose control: per primary  Mobility: As tolerated  Code Status: Full Code  Family Communication: Patient updated on plan of care Disposition: SDU  Labs   CBC: Recent Labs  Lab 07/14/19 0915 07/15/19 0249  WBC 18.2* 10.6*  NEUTROABS 14.3* 8.7*  HGB 11.5* 10.7*  HCT 37.2 35.4*  MCV 87.5 88.9  PLT 620* 561*    Basic Metabolic Panel: Recent Labs  Lab 07/14/19 0915 07/14/19 1126 07/15/19 0249  NA 136  --  139  K 4.3  --  4.0  CL 103  --  108  CO2 22  --  21*  GLUCOSE 107*  --  130*  BUN 7  --  8  CREATININE 0.57  --  0.39*  CALCIUM 8.8*  --  8.9  MG  --  1.9  --   PHOS  --  4.2  --    GFR: Estimated Creatinine Clearance: 120.6 mL/min (A) (by C-G formula based on SCr of 0.39 mg/dL (L)). Recent Labs  Lab 07/14/19 0915 07/14/19 1126 07/15/19 0249  PROCALCITON  --  <0.10  --   WBC 18.2*  --  10.6*    Liver Function Tests: Recent Labs  Lab 07/14/19 0915  AST 27  ALT 26  ALKPHOS 89  BILITOT 0.6  PROT 7.6  ALBUMIN 2.7*   No results for input(s): LIPASE, AMYLASE in the last 168 hours. No results for input(s): AMMONIA in the last 168 hours.  ABG    Component Value Date/Time   PHART 7.418 07/14/2019 0915   PCO2ART 37.3 07/14/2019 0915   PO2ART 68.4 (L) 07/14/2019 0915   HCO3 23.6 07/14/2019 0915   ACIDBASEDEF 0.1 07/14/2019 0915   O2SAT 92.3 07/14/2019 0915     Coagulation Profile: No results for input(s): INR, PROTIME in the last 168 hours.  Cardiac Enzymes: No results for input(s): CKTOTAL, CKMB,  CKMBINDEX, TROPONINI in the last 168 hours.  HbA1C: No results found for: HGBA1C  CBG: No results for input(s): GLUCAP in the last 168 hours.  Review of Systems: Positives in [place]   Gen: Denies fever, chills, weight change, fatigue, night sweats HEENT: Denies blurred vision, double vision, hearing loss, tinnitus, sinus congestion, rhinorrhea, sore throat, neck stiffness, dysphagia PULM: Denies shortness of breath, dry cough, sputum production, hemoptysis, wheezing CV: Denies chest pain, edema, orthopnea, paroxysmal nocturnal dyspnea, palpitations GI: Denies abdominal pain, nausea, vomiting, diarrhea, hematochezia, melena, constipation, change in bowel habits GU: Denies dysuria, hematuria, polyuria, oliguria, urethral discharge Endocrine: Denies hot or cold intolerance, polyuria, polyphagia or appetite change Derm: Denies rash, dry skin, scaling or peeling skin change Heme: Denies easy bruising, bleeding, bleeding gums Neuro: Denies headache, numbness, weakness, slurred speech, loss of memory or consciousness  Past Medical History  She,  has a past medical history of Cancer (  [place]) and Pneumonia (05/08/2019).   Surgical History    Past Surgical History:  Procedure Laterality Date  . BIOPSY  07/03/2019   Procedure: BIOPSY;  Surgeon: Rigoberto Noel, MD;  Location[facility] END[facility]COPY;  Service: Cardiopulmonary;;  . BRONCHIAL BRUSHINGS  07/03/2019   Procedure: BRONCHIAL BRUSHINGS;  Surgeon: Rigoberto Noel, MD;  Location: [facility] ENDOSCOPY;  Service: Cardiopulmonary;;  . BRONCHIAL WASHINGS  07/03/2019   Procedure: BRONCHIAL WASHINGS;  Surgeon: Rigoberto Noel, MD;  Location: [facility] ENDOSCOPY;  Service: Cardiopulmonary;;  . NO PAST SURGERIES    . TOOTH EXTRACTION    . VIDEO BRONCHOSCOPY N/A 07/03/2019   Procedure: VIDEO BRONCHOSCOPY WITH FLUORO;  Surgeon: Rigoberto Noel, MD;  Location: [place];  Service: Cardiopulmonary;  Laterality: N/A;  LMA     Social History   reports that she has never smoked.  She has never used smokeless tobacco. She reports that she does not drink alcohol or use drugs.   Family History   Her family history includes Cancer in her maternal grandmother and paternal grandfather; Depression in her maternal uncle; Diabetes in her father and maternal uncle; Hyperlipidemia in her father and maternal grandmother; Hypertension in her father.   Allergies Allergies  Allergen Reactions  . Iron Other (See Comments)    Ferrosol - Liquid  Unknown reaction      Home Medications  Prior to Admission medications   Medication Sig Start Date End Date Taking? Authorizing Provider  acetaminophen (TYLENOL) 500 MG tablet Take 1,000 mg by mouth every 6 (six) hours as needed for moderate pain or headache.   Yes [provider]  albuterol (VENTOLIN HFA) 108 (90 Base) MCG/ACT inhaler Inhale 2 puffs into the lungs every 6 (six) hours as needed for wheezing or shortness of breath. 07/08/19  Yes Amin, Jeanella Flattery, MD  guaifenesin (ROBITUSSIN) 100 MG/5ML syrup Take 200 mg by mouth 3 (three) times daily as needed for cough.   Yes [provider]  hydrOXYzine (ATARAX/VISTARIL) 25 MG tablet Take 1 tablet (25 mg total) by mouth 3 (three) times daily as needed for anxiety. 07/08/19  Yes Amin, Jeanella Flattery, MD  methimazole (TAPAZOLE) 5 MG tablet Take 1 tablet (5 mg total) by mouth 3 (three) times daily. 06/08/19  Yes Shamleffer, Melanie Crazier, MD  Multiple Vitamin (MULTIVITAMIN WITH MINERALS) TABS tablet Take 2 tablets by mouth daily. Gummies   Yes [provider]  OVER THE COUNTER MEDICATION Take 2 each by mouth daily as needed (anxiety). CBD gummies   Yes [provider]  propranolol (INDERAL) 40 MG tablet Take 1 tablet (40 mg total) by mouth 3 (three) times daily. 06/08/19  Yes Shamleffer, Melanie Crazier, MD     Critical care time: n/a    Noe Gens, MSN, NP-C [facility] 07/15/2019, 9:05 AM   Please see [email or web address] for pager details.

## 2019-07-16 ENCOUNTER — Encounter: Payer: Medicaid Other | Admitting: Family Medicine

## 2019-07-16 ENCOUNTER — Inpatient Hospital Stay: Payer: Medicaid Other | Admitting: Physician Assistant

## 2019-07-16 ENCOUNTER — Telehealth: Payer: Self-pay | Admitting: Physician Assistant

## 2019-07-16 DIAGNOSIS — J189 Pneumonia, unspecified organism: Secondary | ICD-10-CM

## 2019-07-16 LAB — BASIC METABOLIC PANEL
Anion gap: 9 (ref 5–15)
BUN: 10 mg/dL (ref 6–20)
CO2: 23 mmol/L (ref 22–32)
Calcium: 8.7 mg/dL — ABNORMAL LOW (ref 8.9–10.3)
Chloride: 108 mmol/L (ref 98–111)
Creatinine, Ser: 0.51 mg/dL (ref 0.44–1.00)
GFR calc Af Amer: >60 mL/min (ref >60)
GFR calc non Af Amer: >60 mL/min (ref >60)
Glucose, Bld: 109 mg/dL — ABNORMAL HIGH (ref 70–99)
Potassium: 4.4 mmol/L (ref 3.5–5.1)
Sodium: 140 mmol/L (ref 135–145)

## 2019-07-16 LAB — CBC WITH DIFFERENTIAL/PLATELET
Abs Immature Granulocytes: 0.11 10*3/uL — ABNORMAL HIGH (ref 0.00–0.07)
Basophils Absolute: 0.1 10*3/uL (ref 0.0–0.1)
Basophils Relative: 0 %
Eosinophils Absolute: 0.1 10*3/uL (ref 0.0–0.5)
Eosinophils Relative: 1 %
HCT: 38.3 % (ref 36.0–46.0)
Hemoglobin: 12.1 g/dL (ref 12.0–15.0)
Immature Granulocytes: 1 %
Lymphocytes Relative: 15 %
Lymphs Abs: 3.2 10*3/uL (ref 0.7–4.0)
MCH: 27.2 pg (ref 26.0–34.0)
MCHC: 31.6 g/dL (ref 30.0–36.0)
MCV: 86.1 fL (ref 80.0–100.0)
Monocytes Absolute: 1.2 10*3/uL — ABNORMAL HIGH (ref 0.1–1.0)
Monocytes Relative: 5 %
Neutro Abs: 17.1 10*3/uL — ABNORMAL HIGH (ref 1.7–7.7)
Neutrophils Relative %: 78 %
Platelets: 639 10*3/uL — ABNORMAL HIGH (ref 150–400)
RBC: 4.45 MIL/uL (ref 3.87–5.11)
RDW: 16.3 % — ABNORMAL HIGH (ref 11.5–15.5)
WBC: 21.8 10*3/uL — ABNORMAL HIGH (ref 4.0–10.5)
nRBC: 0 % (ref 0.0–0.2)

## 2019-07-16 MED ORDER — ALPRAZOLAM 0.25 MG PO TABS: 0.2500 mg | ORAL_TABLET | Freq: Two times a day (BID) | ORAL | Status: DC | PRN

## 2019-07-16 MED ORDER — GUAIFENESIN-DM 100-10 MG/5ML PO SYRP
5.0000 mL | ORAL_SOLUTION | ORAL | Status: DC | PRN
  Administered 2019-07-16 (×2): 5 mL via ORAL
  Filled 2019-07-16 (×2): qty 10

## 2019-07-16 NOTE — Progress Notes (Signed)
NAME:  Sarah Chandler, MRN:  [id number], DOB:  March 13, 1986, LOS: 2 ADMISSION DATE:  07/14/2019, CONSULTATION DATE: 07/15/19 REFERRING MD:  Dr. Avon Gully, CHIEF COMPLAINT:  Shortness of breath    Brief History   34 y/o F with poorly differentiated adenocarcinoma of the lung admitted with worsening SOB in the setting of progressive diffuse multifocal airspace disease.  Past Medical History  Hyperthyroidism  Adenocarcinoma of the [facility] Events   4/06 Admit with SOB, malaise, weakness 4/07 PCCM consulted   Consults:  PCCM ONC  Procedures:    Significant Diagnostic Tests:   CTA Chest PE 4/6 >> negative for PE, interval progression in extensive multifocal airspace opacities and consolidation, small bilateral pleural effusions L>R, mediastinal & bilateral axillary adenopathy with slight progression   Micro Data:  COVID 4/6 >> negative  MRSA PCR 4/6 >> negative  BCx2 4/6 >>   Antimicrobials:  Vanco 4/6 >> 4/7 Cefepime 4/6 >>   Interim history/subjective:  Pt reports she just experienced a panic attack.  Feeling better.  States she felt like the walls were closing in on her.   Objective   Blood pressure (!) 145/95, pulse 94, temperature 97.9 F (36.6 C), temperature source Axillary, resp. rate (!) 33, height 5\' 10"  (1.778 m), weight 86.6 kg, last menstrual period 06/24/2019, SpO2 92 %.        Intake/Output Summary (Last 24 hours) at 07/16/2019 0850 Last data filed at 07/16/2019 0530 Gross per 24 hour  Intake 2055.65 ml  Output 2100 ml  Net -44.35 ml   Filed Weights   07/14/19 0851  Weight: 86.6 kg    Examination: General: young adult female sitting up in bed in NAD HEENT: MM pink/moist,  + NRB O2 Neuro: AAOx4, speech clear, MAE CV: s1s2 RRR, ST, no m/r/g PULM: non-labored on NRB + HFNC, lungs bilaterally with crackles  GI: soft, bsx4 active  Extremities: warm/dry, no edema  Skin: no rashes or lesions  Resolved Hospital Problem list       Assessment & Plan:   Acute Hypoxemic Respiratory Failure in setting of Adenocarcinoma with Bilateral Infiltrates, Effusions Initial symptoms began Nov 2020, admit for PNA 04/2019, 06/2019 with diagnosis of poorly differentiated adenocarcinoma 3/26 by FOB.  Molecular genetics pending. Doubt bacterial component (PCT negative). Suspect this is progression of disease.  -O2 to support sats >90% -pulmonary hygiene - IS, mobilize -follow cultures -await molecular pathology  -would like to start therapy for malignancy as soon as possible  -follow intermittent CXR -continue empiric abx, consider short duration given low suspicion for infectious component -monitor effusion size, if enlarging, consider therapeutic thoracentesis for dyspnea relief    Best practice:  Diet: per primary  Pain/Anxiety/Delirium protocol (if indicated): per primary  VAP protocol (if indicated): n/a  DVT prophylaxis: per primary  GI prophylaxis: n/a  Glucose control: per primary  Mobility: As tolerated  Code Status: Full Code  Family Communication: Patient updated on plan of care 4/8 Disposition: SDU  Labs   CBC: Recent Labs  Lab 07/14/19 0915 07/15/19 0249 07/16/19 0313  WBC 18.2* 10.6* 21.8*  NEUTROABS 14.3* 8.7* 17.1*  HGB 11.5* 10.7* 12.1  HCT 37.2 35.4* 38.3  MCV 87.5 88.9 86.1  PLT 620* 561* 639*    Basic Metabolic Panel: Recent Labs  Lab 07/14/19 0915 07/14/19 1126 07/15/19 0249 07/16/19 0313  NA 136  --  139 140  K 4.3  --  4.0 4.4  CL 103  --  108 108  CO2 22  --  21* 23  GLUCOSE 107*  --  130* 109*  BUN 7  --  8 10  CREATININE 0.57  --  0.39* 0.51  CALCIUM 8.8*  --  8.9 8.7*  MG  --  1.9  --   --   PHOS  --  4.2  --   --    GFR: Estimated Creatinine Clearance: 120.6 mL/min (by C-G formula based on SCr of 0.51 mg/dL). Recent Labs  Lab 07/14/19 0915 07/14/19 1126 07/15/19 0249 07/16/19 0313  PROCALCITON  --  <0.10  --   --   WBC 18.2*  --  10.6* 21.8*    Liver Function  Tests: Recent Labs  Lab 07/14/19 0915  AST 27  ALT 26  ALKPHOS 89  BILITOT 0.6  PROT 7.6  ALBUMIN 2.7*   No results for input(s): LIPASE, AMYLASE in the last 168 hours. No results for input(s): AMMONIA in the last 168 hours.  ABG    Component Value Date/Time   PHART 7.418 07/14/2019 0915   PCO2ART 37.3 07/14/2019 0915   PO2ART 68.4 (L) 07/14/2019 0915   HCO3 23.6 07/14/2019 0915   ACIDBASEDEF 0.1 07/14/2019 0915   O2SAT 92.3 07/14/2019 0915     Coagulation Profile: No results for input(s): INR, PROTIME in the last 168 hours.  Cardiac Enzymes: No results for input(s): CKTOTAL, CKMB, CKMBINDEX, TROPONINI in the last 168 hours.  HbA1C: No results found for: HGBA1C  CBG: No results for input(s): GLUCAP in the last 168 hours.    Critical care time: n/a    Noe Gens, MSN, NP-C [facility] 07/16/2019, 8:50 AM   Please see [email or web address] for pager details.

## 2019-07-16 NOTE — Progress Notes (Addendum)
HEMATOLOGY-ONCOLOGY PROGRESS NOTE  SUBJECTIVE: The patient presented to the emergency room with shortness of breath.  She was noted to be hypoxic in the emergency room even on 6 L via nasal cannula.  She is requiring a nonrebreather mask.  CT angiogram of the chest showed no evidence of PE but did show interval progression and extensive multifocal airspace opacities and consolidation which is likely multifocal pneumonia but cannot exclude interval progression in the patient's known primary lung neoplasm with metastatic disease, small bilateral pleural effusions left greater than right, and mediastinal and bilateral axillary adenopathy with slight interval progression.  She has been seen by PCCM who thinks acute on chronic respiratory failure secondary to disease progression and less likely due to pneumonia.  Recommend antibiotics and weaning O2 when able.  When seen today, the patient reports ongoing shortness of breath.  She is on both a nasal cannula and a nonrebreather.  Reports that she is having some panic attacks which worsens her breathing.  She is receiving Xanax as needed.  She denies chest pain today.  She has no other complaints.  Tissue has been sent for foundation 1 studies and PD-L1 expression.  Foundation 1 results are currently pending and are expected to return on 07/20/2019.  PD-L1 expression was 0%.  Guardant 360 testing obtained and results currently pending.  REVIEW OF SYSTEMS:   Constitutional: Denies fevers, chills or abnormal weight loss Respiratory: Reports shortness of breath Cardiovascular: Denies palpitation, chest discomfort Gastrointestinal:  Denies nausea, heartburn or change in bowel habits Skin: Denies abnormal skin rashes Lymphatics: Denies new lymphadenopathy or easy bruising Neurological:Denies numbness, tingling or new weaknesses Behavioral/Psych: Reports intermittent panic attacks Extremities: No lower extremity edema All other systems were reviewed with the  patient and are negative.  I have reviewed the past medical history, past surgical history, social history and family history with the patient and they are unchanged from previous note.   PHYSICAL EXAMINATION: ECOG PERFORMANCE STATUS: 1 - Symptomatic but completely ambulatory  Vitals:   07/16/19 0900 07/16/19 0922  BP: (!) 144/81   Pulse: 96 (!) 104  Resp: (!) 36 (!) 32  Temp:    SpO2: 94% 94%   Filed Weights   07/14/19 0851  Weight: 86.6 kg    Intake/Output from previous day: 04/07 0701 - 04/08 0700 In: 2419.4 [I.V.:1923.2; IV Piggyback:496.2] Out: 2100 [Urine:2100]  GENERAL:alert, appears short of breath SKIN: skin color, texture, turgor are normal, no rashes or significant lesions EYES: normal, Conjunctiva are pink and non-injected, sclera clear OROPHARYNX:no exudate, no erythema and lips, buccal mucosa, and tongue normal  LUNGS: Tachypnea, lungs with coarse breath sounds bilaterally  HEART: Tachycardic, no murmurs, gallops, rubs ABDOMEN:abdomen soft, non-tender and normal bowel sounds Musculoskeletal:no cyanosis of digits and no clubbing  NEURO: alert & oriented x 3 with fluent speech, no focal motor/sensory deficits  LABORATORY DATA:  I have reviewed the data as listed CMP Latest Ref Rng & Units 07/16/2019 07/15/2019 07/14/2019  Glucose 70 - 99 mg/dL 109(H) 130(H) 107(H)  BUN 6 - 20 mg/dL '10 8 7  ' Creatinine 0.44 - 1.00 mg/dL 0.51 0.39(L) 0.57  Sodium 135 - 145 mmol/L 140 139 136  Potassium 3.5 - 5.1 mmol/L 4.4 4.0 4.3  Chloride 98 - 111 mmol/L 108 108 103  CO2 22 - 32 mmol/L 23 21(L) 22  Calcium 8.9 - 10.3 mg/dL 8.7(L) 8.9 8.8(L)  Total Protein 6.5 - 8.1 g/dL - - 7.6  Total Bilirubin 0.3 - 1.2 mg/dL - - 0.6  Alkaline Phos 38 - 126 U/L - - 89  AST 15 - 41 U/L - - 27  ALT 0 - 44 U/L - - 26    Lab Results  Component Value Date   WBC 21.8 (H) 07/16/2019   HGB 12.1 07/16/2019   HCT 38.3 07/16/2019   MCV 86.1 07/16/2019   PLT 639 (H) 07/16/2019   NEUTROABS 17.1  (H) 07/16/2019    CT Angio Chest PE W/Cm &/Or Wo Cm  Result Date: 07/14/2019 CLINICAL DATA:  Shortness of breath, recent diagnosis of lung cancer, COVID negative EXAM: CT ANGIOGRAPHY CHEST WITH CONTRAST TECHNIQUE: Multidetector CT imaging of the chest was performed using the standard protocol during bolus administration of intravenous contrast. Multiplanar CT image reconstructions and MIPs were obtained to evaluate the vascular anatomy. CONTRAST:  165m OMNIPAQUE IOHEXOL 350 MG/ML SOLN COMPARISON:  Radiograph same day, CT chest July 01, 2019 FINDINGS: Cardiovascular: There is a optimal opacification of the pulmonary arteries. There is no central,segmental, or subsegmental filling defects within the pulmonary arteries. The heart is normal in size. A trace pericardial effusion/thickening is seen. No evidence right heart strain. There is normal three-vessel brachiocephalic anatomy without proximal stenosis. The thoracic aorta is normal in appearance. Mediastinum/Nodes: Slight interval progression in the scattered mediastinal and bilateral axillary adenopathy. For example within the left axilla there is a 1.2 cm lymph node, series 4, image 32 and previously 1 cm. There is also 1.0 cm subcarinal lymph node which was previously 8 mm. Lungs/Pleura: There is interval progression in the multifocal extensive airspace patchy opacities throughout both lungs. There are areas of dense consolidation seen within the left lung base. Small bilateral pleural effusions are present, right greater than left. Upper Abdomen: Again noted are scattered left-sided para aortic lymph nodes. Musculoskeletal: Again noted are numerous tiny sclerotic foci within the visualized axial and appendicular skeleton. No destructive osseous lesion or fracture is identified. Review of the MIP images confirms the above findings. IMPRESSION: 1. No central, segmental, or subsegmental pulmonary embolism. 2. Interval progression in extensive multifocal  airspace opacities and consolidation, likely multifocal pneumonia however cannot exclude interval progression in the patient's known primary lung neoplasm with metastatic disease. 3. Small bilateral pleural effusions, left greater than right 4. Mediastinal and bilateral axillary adenopathy, with slight interval progression Electronically Signed   By: BPrudencio PairM.D.   On: 07/14/2019 11:40   CT Angio Chest PE W/Cm &/Or Wo Cm  Result Date: 07/01/2019 CLINICAL DATA:  Shortness of breath, cough, recent diagnosis of pneumonia EXAM: CT ANGIOGRAPHY CHEST WITH CONTRAST TECHNIQUE: Multidetector CT imaging of the chest was performed using the standard protocol during bolus administration of intravenous contrast. Multiplanar CT image reconstructions and MIPs were obtained to evaluate the vascular anatomy. CONTRAST:  717mOMNIPAQUE IOHEXOL 350 MG/ML SOLN COMPARISON:  05/08/2019, 07/01/2019 FINDINGS: Cardiovascular: This is a technically adequate evaluation of the pulmonary vasculature. There are no filling defects or pulmonary emboli. The heart is unremarkable without pericardial effusion. Thoracic aorta is normal in caliber with no evidence of dissection. Mediastinum/Nodes: There is bilateral axillary adenopathy. Largest lymph node in the right axilla measures 27 x 14 mm. Multiple subcentimeter lymph nodes within the mediastinum are stable. The thyroid, esophagus, and trachea are unremarkable. Lungs/Pleura: Since the prior exam, there has been marked progression of the bilateral nodular airspace disease. Marked increase in dense consolidation at the lung bases bilaterally. There is persistent diffuse interlobular septal thickening. Small bilateral pleural effusions have developed in the interim, right greater than left.  No pneumothorax. The central airways are patent. Upper Abdomen: Multiple subcentimeter lymph nodes are seen in the gastrohepatic ligament, largest measuring 9 mm reference image 116. No other acute  abnormalities. Musculoskeletal: Innumerable punctate sclerotic foci are seen throughout the thoracic spine and sternum, nonspecific. There are no acute or destructive bony lesions. Review of the MIP images confirms the above findings. IMPRESSION: 1. Progression of the multifocal bilateral airspace disease seen previously. The persistence an appearance would favor atypical bacterial or fungal pneumonia. Correlation with bronchoscopy and sputum analysis may be useful. 2. Small bilateral pleural effusions, right greater than left. 3. Lymphadenopathy within the bilateral axillary regions, mediastinum, and upper abdomen, nonspecific. No significant change since prior study. 4. No evidence of pulmonary embolus. Electronically Signed   By: Randa Ngo M.D.   On: 07/01/2019 22:32   MR BRAIN W WO CONTRAST  Result Date: 07/08/2019 CLINICAL DATA:  Non-small-cell lung cancer staging EXAM: MRI HEAD WITHOUT AND WITH CONTRAST TECHNIQUE: Multiplanar, multiecho pulse sequences of the brain and surrounding structures were obtained without and with intravenous contrast. CONTRAST:  69m GADAVIST GADOBUTROL 1 MMOL/ML IV SOLN COMPARISON:  None. FINDINGS: Brain: Ventricle size normal. Negative for acute infarct. Negative for hemorrhage. Small enhancing lesions are present in the brain compatible with metastatic disease. 2 mm enhancing lesion right parietal operculum axial image 32 2 x 4 mm enhancing lesion right occipital parietal lobe axial image 32 3 mm enhancing mass left posterior frontal cortex which appears anterior to the motor cortex axial image 43 2 mm enhancing lesion in the left frontal parietal lobe which may be within the motor cortex axial image 39. No significant brain edema.  No midline shift. Vascular: Normal arterial flow voids Skull and upper cervical spine: No focal skeletal lesion. Sinuses/Orbits: Negative Other: None IMPRESSION: At least 4 small metastatic deposits are present in the brain bilaterally as  described above. No significant edema or hemorrhage. Electronically Signed   By: CFranchot GalloM.D.   On: 07/08/2019 11:15   DG Chest Port 1 View  Result Date: 07/14/2019 CLINICAL DATA:  Shortness of breath.  Lung carcinoma EXAM: PORTABLE CHEST 1 VIEW COMPARISON:  Chest CT July 03, 2019 and CT angiogram chest July 01, 2019 FINDINGS: There is widespread airspace opacity throughout the lungs bilaterally with slight progression in consolidation in the lower lung regions compared to most recent study. There is airspace opacity adjacent to the left superior hilum, stable. There are fairly small pleural effusions as well. The heart size is normal. Pulmonary vascularity is grossly normal. No adenopathy evident by radiography. No bone lesions appreciable by radiography. IMPRESSION: Widespread airspace opacity consistent with multifocal pneumonia. Pleural effusions present. It should be noted that an underlying parenchymal neoplastic involvement in the lungs cannot be excluded, particularly given findings on recent CT. Stable cardiac silhouette. No adenopathy demonstrable by radiography; see prior chest CT report, however. Electronically Signed   By: WLowella GripIII M.D.   On: 07/14/2019 09:28   DG CHEST PORT 1 VIEW  Result Date: 07/03/2019 CLINICAL DATA:  Status post bronchoscopy for respiratory failure. EXAM: PORTABLE CHEST 1 VIEW COMPARISON:  July 01 2019 FINDINGS: The mediastinal contour and cardiac silhouette are stable. Bibasilar infiltrates are noted without interval change. Left suprahilar opacity is identified unchanged. There is no pleural line to suggest pneumothorax. The bony structures are stable. IMPRESSION: 1. No pneumothorax. 2. Bibasilar infiltrates unchanged. Left suprahilar opacity unchanged. Electronically Signed   By: WAbelardo DieselM.D.   On: 07/03/2019 15:18  DG Chest Portable 1 View  Result Date: 07/01/2019 CLINICAL DATA:  Shortness of breath. EXAM: PORTABLE CHEST 1 VIEW  COMPARISON:  May 07, 2019 FINDINGS: Worsening bibasilar pulmonary infiltrates. Probable small associated effusions. The heart, hila, and mediastinum are unchanged. No other interval changes. IMPRESSION: Worsening bibasilar infiltrates with small associated effusions. This may represent pneumonia or aspiration. It would be unusual for pneumonia two persist since May 07, 2019 however. Recurrent pneumonia or repeated aspiration are possibilities. Recommend clinical correlation and short-term follow-up imaging to ensure resolution. Electronically Signed   By: Dorise Bullion III M.D   On: 07/01/2019 21:14   DG C-ARM BRONCHOSCOPY  Result Date: 07/03/2019 C-ARM BRONCHOSCOPY: Fluoroscopy was utilized by the requesting physician.  No radiographic interpretation.    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  PD-L1 testing was 0% and molecular studies are still pending.  Additionally, guardant 360 testing is pending.  We anticipate results of molecular testing early next week.  Once we have these results, we can have a more detailed discussion of treatment options.  The patient will still need a PET scan on outpatient basis to complete her staging work-up at some point in the near future.   LOS: 2 days   Mikey Bussing, DNP, AGPCNP-BC, AOCNP 07/16/19  ADDENDUM: Hematology/Oncology Attending: I had a face-to-face encounter with the patient today.  I recommended her care plan and agree with the above note.  This is a very pleasant 34 years old never smoker white female who was recently diagnosed with a stage IV non-small cell lung cancer, poorly differentiated  adenocarcinoma with signet ring features presented with bilateral pulmonary nodules and masses as well as few small brain metastasis. PD-L1 expression was 0%.  Molecular studies was performed by foundation [employer] as well as guardant and the results are still pending.  We are trying to reach through this agency to see if the results can be released sooner so the patient can start her treatment as soon as possible. It is expected that the foundation 1 report could be released by Monday, July 20, 2019. If the patient has actionable mutations, we will start her treatment as soon as possible otherwise we would consider her for systemic treatment with chemotherapy and combination with immunotherapy. I updated the patient about her current situation and the pending results.  She is currently short of breath and on nasal cannula as well as facemask.  I do not expect her to be discharged over the weekend because of her current condition. Thank you so much for taking good care of Ms. curcumin, I will continue to follow up the patient with you and assist in her management.   Disclaimer: This note was dictated with voice recognition software. Similar sounding words can inadvertently be transcribed and may be missed upon review. Eilleen Kempf, MD

## 2019-07-16 NOTE — Progress Notes (Signed)
Pt awoke from sleep feeling SOB and anxious around 0200 this AM. She was also coughing. Robitussin and hydroxyzine given for these symptoms. In this instance, pt began endorsing O2 sats in the high 80s. After the medication was given, she expressed relief but continued to have oxygen saturations in the high 80s. She was conversational and reported no longer feeling SOB. Sharlet Salina NP paged to clarify oxygen saturation goal, whom felt that a goal of above 88% was acceptable at this time. RN will continue to monitor.

## 2019-07-16 NOTE — Progress Notes (Signed)
PROGRESS NOTE    Sarah Chandler  ZWC:[id number] DOB: 10-26-1985 DOA: 07/14/2019 PCP: Mellody Dance, DO   Brief Narrative:  Sarah Chandler is a 34 y.o. female with medical history significant of lung adenocarcinoma, recently discharged for community-acquired pneumonia who is coming to the emergency department via EMS with progressively worse dyspnea.  She has had chills and night sweats.  She denies she was admitted for a week from 07/01/2019 until 07/08/2019 due to multifocal pneumonia and was discharged home on oxygen.  She continues to have productive cough, fatigue and malaise.  She denies fever or hemoptysis.  She denies headache, sore throat, rhinorrhea, chest pain, palpitations, dizziness, diaphoresis, PND, pitting edema of the lower extremities, abdominal pain, nausea, vomiting, diarrhea, constipation, melena or hematochezia.  No dysuria, frequency or materia.  No polyuria, polydipsia, polyphagia or blurred vision.  Patient admitted via the ED on 07/14/2019 for acute hypoxic respiratory failure in the setting of bilateral diffuse airspace disease concerning for acutely worsening adenocarcinoma versus possible underlying infection.   Assessment & Plan:   Principal Problem:   HCAP (healthcare-associated pneumonia) Active Problems:   Depression   Hyperthyroidism   GAD (generalized anxiety disorder)   Primary lung adenocarcinoma (HCC)   Normocytic anemia   Acute on subacute hypoxic respiratory failure, poa Concern for worsening adenocarcinoma of the lung Rule out infectious process underlying SpO2: 92 % O2 Flow Rate (L/min): 15 L/min Continue supportive care including bronchodilators, albuterol, steroids Likely worsening adenocarcinoma as below. Patient currently on broad-spectrum antibiotics, with ongoing leukemoid reaction but remains afebrile PCCM/pulmonology following, appreciate insight and recommendations about patient's imaging and guidance on what appears to be worsening  airspace disease in the setting of lung cancer Vancomycin/Cefepime at intake - MRSA nares negative, discontinue vancomycin Strep pneumoniae urinary antigen negative Procalcitonin < 0.10 favoring worsening adenocarcinoma over infection  Primary lung adenocarcinoma ([place]) She was scheduled for PET scan 07/14/19 Discussed the case with Dr. Earlie Server, he is awaiting further genetic markers and lab work to further delineate patient's treatment plan, appears to be planning chemotherapy this definitive diagnosis can be made in hopes to give patient p.o. chemotherapy  Depression Generalized anxiety disorder Resume home medications including hydroxyzine Add as needed benzodiazepines today given patient's worsening anxiety in the setting of above  Hyperthyroidism  Continue home medications including Tapazole, and propranolol.  Normocytic anemia Iron deficient on previous admission anemia panel. Monitor hematocrit and hemoglobin.  Thrombocytosis Likely reactive given above, follow with morning labs   DVT prophylaxis: Lovenox SQ Code Status: Full code. Family Communication: Disposition Plan:  Remains as inpatient, worsening hypoxic respiratory failure in the setting of primary lung adenocarcinoma as above, currently ruling out infectious process underlying.  Patient's markedly abnormal CT chest expect prolonged hospital stay and marked hypoxia requiring close observation further evaluation and possible procedure pending specialist evaluation Consults called: PCCM, oncology Admission status: Inpatient/stepdown  Procedures:   None  Antimicrobials:  Cefepime 07/14/19-->ongoing Vancomycin initiated 07/14/2019 --> discontinued 07/15/2019  Subjective: Overnight patient had questionably worsening respiratory status but appears to be more of an anxiety/panic attack.  Oxygen saturations remained stable but patient became moderately tachypneic and tachycardic.  Otherwise she declines nausea, vomiting,  diarrhea, constipation, headache, fevers, chills.  Objective: Vitals:   07/16/19 0400 07/16/19 0436 07/16/19 0500 07/16/19 0600  BP: 135/83  (!) 157/98 (!) 145/95  Pulse: 86 82 87 94  Resp: (!) 29 (!) 32 (!) 31 (!) 33  Temp:      TempSrc:  SpO2: (!) 89% 93% 92% 92%  Weight:      Height:        Intake/Output Summary (Last 24 hours) at 07/16/2019 0701 Last data filed at 07/16/2019 0530 Gross per 24 hour  Intake 2419.38 ml  Output 2100 ml  Net 319.38 ml   Filed Weights   07/14/19 0851  Weight: 86.6 kg    Examination:  General:  Pleasantly resting in bed, No acute distress. HEENT:  Normocephalic atraumatic.  Sclerae nonicteric, noninjected.  Extraocular movements intact bilaterally. Neck:  Without mass or deformity.  Trachea is midline. Lungs: Diffuse coarse breath sounds bilaterally, diminished breath sounds bibasilarly with end expiratory rhonchi biapically. Heart:  Regular rate and rhythm.  Without murmurs, rubs, or gallops. Abdomen:  Soft, nontender, nondistended.  Without guarding or rebound. Extremities: Without cyanosis, clubbing, edema, or obvious deformity. Vascular:  Dorsalis pedis and posterior tibial pulses palpable bilaterally. Skin:  Warm and dry, no erythema, no ulcerations.  Data Reviewed: I have personally reviewed following labs and imaging studies  CBC: Recent Labs  Lab 07/14/19 0915 07/15/19 0249 07/16/19 0313  WBC 18.2* 10.6* 21.8*  NEUTROABS 14.3* 8.7* 17.1*  HGB 11.5* 10.7* 12.1  HCT 37.2 35.4* 38.3  MCV 87.5 88.9 86.1  PLT 620* 561* 299*   Basic Metabolic Panel: Recent Labs  Lab 07/14/19 0915 07/14/19 1126 07/15/19 0249 07/16/19 0313  NA 136  --  139 140  K 4.3  --  4.0 4.4  CL 103  --  108 108  CO2 22  --  21* 23  GLUCOSE 107*  --  130* 109*  BUN 7  --  8 10  CREATININE 0.57  --  0.39* 0.51  CALCIUM 8.8*  --  8.9 8.7*  MG  --  1.9  --   --   PHOS  --  4.2  --   --    GFR: Estimated Creatinine Clearance: 120.6 mL/min (by C-G  formula based on SCr of 0.51 mg/dL). Liver Function Tests: Recent Labs  Lab 07/14/19 0915  AST 27  ALT 26  ALKPHOS 89  BILITOT 0.6  PROT 7.6  ALBUMIN 2.7*   No results for input(s): LIPASE, AMYLASE in the last 168 hours. No results for input(s): AMMONIA in the last 168 hours. Coagulation Profile: No results for input(s): INR, PROTIME in the last 168 hours. Cardiac Enzymes: No results for input(s): CKTOTAL, CKMB, CKMBINDEX, TROPONINI in the last 168 hours. BNP (last 3 results) No results for input(s): PROBNP in the last 8760 hours. HbA1C: No results for input(s): HGBA1C in the last 72 hours. CBG: No results for input(s): GLUCAP in the last 168 hours. Lipid Profile: No results for input(s): CHOL, HDL, LDLCALC, TRIG, CHOLHDL, LDLDIRECT in the last 72 hours. Thyroid Function Tests: No results for input(s): TSH, T4TOTAL, FREET4, T3FREE, THYROIDAB in the last 72 hours. Anemia Panel: No results for input(s): VITAMINB12, FOLATE, FERRITIN, TIBC, IRON, RETICCTPCT in the last 72 hours. Sepsis Labs: Recent Labs  Lab 07/14/19 1126  PROCALCITON <0.10    Recent Results (from the past 240 hour(s))  Blood culture (routine x 2)     Status: None (Preliminary result)   Collection Time: 07/14/19  9:14 AM   Specimen: BLOOD  Result Value Ref Range Status   Specimen Description   Final    BLOOD RIGHT ANTECUBITAL Performed at [facility] [address] [place], [place] [address]    Special Requests   Final    BOTTLES DRAWN AEROBIC AND  ANAEROBIC Blood Culture adequate volume Performed at [facility] [address], [place], [place] [address]    Culture   Final    NO GROWTH < 24 HOURS Performed at [facility] [address], [place], [place] [address]    Report Status PENDING  Incomplete  Blood culture (routine x 2)     Status: None (Preliminary result)   Collection Time: 07/14/19 10:15 AM   Specimen: BLOOD  Result Value Ref Range  Status   Specimen Description   Final    BLOOD LEFT ANTECUBITAL Performed at [facility] [address], [place], [place] [address]    Special Requests   Final    BOTTLES DRAWN AEROBIC AND ANAEROBIC Blood Culture results may not be optimal due to an excessive volume of blood received in culture bottles Performed at [facility] [address], [place], [place] [address]    Culture   Final    NO GROWTH < 24 HOURS Performed at [facility] [address], [place], [place] [address]    Report Status PENDING  Incomplete  SARS CORONAVIRUS 2 (TAT 6-24 HRS) Nasopharyngeal Nasopharyngeal Swab     Status: None   Collection Time: 07/14/19  2:45 PM   Specimen: Nasopharyngeal Swab  Result Value Ref Range Status   SARS Coronavirus 2 NEGATIVE NEGATIVE Final    Comment: (NOTE) SARS-CoV-2 target nucleic acids are NOT DETECTED. The SARS-CoV-2 RNA is generally detectable in upper and lower respiratory specimens during the acute phase of infection. Negative results do not preclude SARS-CoV-2 infection, do not rule out co-infections with other pathogens, and should not be used as the sole basis for treatment or other patient management decisions. Negative results must be combined with clinical observations, patient history, and epidemiological information. The expected result is Negative. Fact Sheet for Patients: SugarRoll.be Fact Sheet for Healthcare Providers: https://www.woods-mathews.com/ This test is not yet approved or cleared by the [place] FDA and  has been authorized for detection and/or diagnosis of SARS-CoV-2 by FDA under an Emergency Use Authorization (EUA). This EUA will remain  in effect (meaning this test can be used) for the duration of the COVID-19 declaration under Section 56 4(b)(1) of the Act, 21 U.S.C. section 360bbb-3(b)(1), unless the authorization is terminated or revoked  sooner. Performed at [facility], [place] [address], [place], [place] [address]   MRSA PCR Screening     Status: None   Collection Time: 07/14/19  8:59 PM   Specimen: Nasal Mucosa; Nasopharyngeal  Result Value Ref Range Status   MRSA by PCR NEGATIVE NEGATIVE Final    Comment:        The GeneXpert MRSA Assay (FDA approved for NASAL specimens only), is one component of a comprehensive MRSA colonization surveillance program. It is not intended to diagnose MRSA infection nor to guide or monitor treatment for MRSA infections. Performed at [facility], [place] [address] [place], [place] [address]     Radiology Studies: CT Angio Chest PE W/Cm &/Or Wo Cm  Result Date: 07/14/2019 CLINICAL DATA:  Shortness of breath, recent diagnosis of lung cancer, COVID negative EXAM: CT ANGIOGRAPHY CHEST WITH CONTRAST TECHNIQUE: Multidetector CT imaging of the chest was performed using the standard protocol during bolus administration of intravenous contrast. Multiplanar CT image reconstructions and MIPs were obtained to evaluate the vascular anatomy. CONTRAST:  12mL OMNIPAQUE IOHEXOL 350 MG/ML SOLN COMPARISON:  Radiograph same day, CT chest July 01, 2019  FINDINGS: Cardiovascular: There is a optimal opacification of the pulmonary arteries. There is no central,segmental, or subsegmental filling defects within the pulmonary arteries. The heart is normal in size. A trace pericardial effusion/thickening is seen. No evidence right heart strain. There is normal three-vessel brachiocephalic anatomy without proximal stenosis. The thoracic aorta is normal in appearance. Mediastinum/Nodes: Slight interval progression in the scattered mediastinal and bilateral axillary adenopathy. For example within the left axilla there is a 1.2 cm lymph node, series 4, image 32 and previously 1 cm. There is also 1.0 cm subcarinal lymph node which was previously 8 mm. Lungs/Pleura: There is interval progression in the  multifocal extensive airspace patchy opacities throughout both lungs. There are areas of dense consolidation seen within the left lung base. Small bilateral pleural effusions are present, right greater than left. Upper Abdomen: Again noted are scattered left-sided para aortic lymph nodes. Musculoskeletal: Again noted are numerous tiny sclerotic foci within the visualized axial and appendicular skeleton. No destructive osseous lesion or fracture is identified. Review of the MIP images confirms the above findings. IMPRESSION: 1. No central, segmental, or subsegmental pulmonary embolism. 2. Interval progression in extensive multifocal airspace opacities and consolidation, likely multifocal pneumonia however cannot exclude interval progression in the patient's known primary lung neoplasm with metastatic disease. 3. Small bilateral pleural effusions, left greater than right 4. Mediastinal and bilateral axillary adenopathy, with slight interval progression Electronically Signed   By: Prudencio Pair M.D.   On: 07/14/2019 11:40   DG Chest Port 1 View  Result Date: 07/14/2019 CLINICAL DATA:  Shortness of breath.  Lung carcinoma EXAM: PORTABLE CHEST 1 VIEW COMPARISON:  Chest CT July 03, 2019 and CT angiogram chest July 01, 2019 FINDINGS: There is widespread airspace opacity throughout the lungs bilaterally with slight progression in consolidation in the lower lung regions compared to most recent study. There is airspace opacity adjacent to the left superior hilum, stable. There are fairly small pleural effusions as well. The heart size is normal. Pulmonary vascularity is grossly normal. No adenopathy evident by radiography. No bone lesions appreciable by radiography. IMPRESSION: Widespread airspace opacity consistent with multifocal pneumonia. Pleural effusions present. It should be noted that an underlying parenchymal neoplastic involvement in the lungs cannot be excluded, particularly given findings on recent CT. Stable  cardiac silhouette. No adenopathy demonstrable by radiography; see prior chest CT report, however. Electronically Signed   By: Lowella Grip III M.D.   On: 07/14/2019 09:28    Scheduled Meds: . Chlorhexidine Gluconate Cloth  6 each Topical Daily  . enoxaparin (LOVENOX) injection  40 mg Subcutaneous Q24H  . guaiFENesin  600 mg Oral BID  . ipratropium-albuterol  3 mL Nebulization TID  . mouth rinse  15 mL Mouth Rinse BID  . methimazole  5 mg Oral TID  . propranolol  40 mg Oral TID   Continuous Infusions: . sodium chloride 88 mL/hr at 07/16/19 0300  . ceFEPime (MAXIPIME) IV Stopped (07/16/19 5956)     LOS: 2 days    Time spent: 38min   Sarah Macnaughton C Cherrish Vitali, DO [facility]  If 7PM-7AM, please contact night-coverage [email or web address]  07/16/2019, 7:01 AM

## 2019-07-16 NOTE — Telephone Encounter (Signed)
Scheduled appt per 4/8 sch message - pt mother aware of change

## 2019-07-17 ENCOUNTER — Telehealth: Payer: Self-pay | Admitting: Medical Oncology

## 2019-07-17 DIAGNOSIS — J189 Pneumonia, unspecified organism: Secondary | ICD-10-CM

## 2019-07-17 LAB — CBC WITH DIFFERENTIAL/PLATELET
Abs Immature Granulocytes: 0.03 10*3/uL (ref 0.00–0.07)
Basophils Absolute: 0.1 10*3/uL (ref 0.0–0.1)
Basophils Relative: 0 %
Eosinophils Absolute: 0.6 10*3/uL — ABNORMAL HIGH (ref 0.0–0.5)
Eosinophils Relative: 4 %
HCT: 35.9 % — ABNORMAL LOW (ref 36.0–46.0)
Hemoglobin: 11.1 g/dL — ABNORMAL LOW (ref 12.0–15.0)
Immature Granulocytes: 0 %
Lymphocytes Relative: 16 %
Lymphs Abs: 2.2 10*3/uL (ref 0.7–4.0)
MCH: 27.2 pg (ref 26.0–34.0)
MCHC: 30.9 g/dL (ref 30.0–36.0)
MCV: 88 fL (ref 80.0–100.0)
Monocytes Absolute: 0.7 10*3/uL (ref 0.1–1.0)
Monocytes Relative: 5 %
Neutro Abs: 10.1 10*3/uL — ABNORMAL HIGH (ref 1.7–7.7)
Neutrophils Relative %: 75 %
Platelets: 578 10*3/uL — ABNORMAL HIGH (ref 150–400)
RBC: 4.08 MIL/uL (ref 3.87–5.11)
RDW: 16 % — ABNORMAL HIGH (ref 11.5–15.5)
WBC: 13.7 10*3/uL — ABNORMAL HIGH (ref 4.0–10.5)
nRBC: 0 % (ref 0.0–0.2)

## 2019-07-17 LAB — BASIC METABOLIC PANEL
Anion gap: 10 (ref 5–15)
BUN: 9 mg/dL (ref 6–20)
CO2: 21 mmol/L — ABNORMAL LOW (ref 22–32)
Calcium: 8.4 mg/dL — ABNORMAL LOW (ref 8.9–10.3)
Chloride: 106 mmol/L (ref 98–111)
Creatinine, Ser: 0.45 mg/dL (ref 0.44–1.00)
GFR calc Af Amer: >60 mL/min (ref >60)
GFR calc non Af Amer: >60 mL/min (ref >60)
Glucose, Bld: 91 mg/dL (ref 70–99)
Potassium: 3.9 mmol/L (ref 3.5–5.1)
Sodium: 137 mmol/L (ref 135–145)

## 2019-07-17 MED ORDER — METHYLPREDNISOLONE SODIUM SUCC 40 MG IJ SOLR: 40.0000 mg | Freq: Four times a day (QID) | INTRAMUSCULAR | Status: DC

## 2019-07-17 MED ORDER — METHYLPREDNISOLONE SODIUM SUCC 125 MG IJ SOLR
125.0000 mg | Freq: Once | INTRAMUSCULAR | Status: AC
  Administered 2019-07-17: 13:00:00 125 mg via INTRAVENOUS
  Filled 2019-07-17: qty 2

## 2019-07-17 MED ORDER — LIP MEDEX EX OINT
TOPICAL_OINTMENT | CUTANEOUS | Status: AC
  Filled 2019-07-17: qty 7

## 2019-07-17 MED ORDER — LORATADINE 10 MG PO TABS
10.0000 mg | ORAL_TABLET | Freq: Every day | ORAL | Status: DC
  Administered 2019-07-17 – 2019-07-21 (×5): 10 mg via ORAL
  Filled 2019-07-17 (×5): qty 1

## 2019-07-17 MED ORDER — METHYLPREDNISOLONE SODIUM SUCC 40 MG IJ SOLR
40.0000 mg | Freq: Four times a day (QID) | INTRAMUSCULAR | Status: DC
  Administered 2019-07-17 – 2019-07-21 (×15): 40 mg via INTRAVENOUS
  Filled 2019-07-17 (×15): qty 1

## 2019-07-17 MED ORDER — LIP MEDEX EX OINT: TOPICAL_OINTMENT | CUTANEOUS | Status: DC | PRN

## 2019-07-17 NOTE — Progress Notes (Signed)
PROGRESS NOTE    Sarah Chandler  YDX:[id number] DOB: 1985-07-11 DOA: 07/14/2019 PCP: Mellody Dance, DO   Brief Narrative:  Sarah Chandler is a 34 y.o. female with medical history significant of lung adenocarcinoma, recently discharged for community-acquired pneumonia who is coming to the emergency department via EMS with progressively worse dyspnea.  She has had chills and night sweats.  She denies she was admitted for a week from 07/01/2019 until 07/08/2019 due to multifocal pneumonia and was discharged home on oxygen.  She continues to have productive cough, fatigue and malaise.  She denies fever or hemoptysis.  She denies headache, sore throat, rhinorrhea, chest pain, palpitations, dizziness, diaphoresis, PND, pitting edema of the lower extremities, abdominal pain, nausea, vomiting, diarrhea, constipation, melena or hematochezia.  No dysuria, frequency or materia.  No polyuria, polydipsia, polyphagia or blurred vision.  Patient admitted via the ED on 07/14/2019 for acute hypoxic respiratory failure in the setting of bilateral diffuse airspace disease concerning for acutely worsening adenocarcinoma versus possible underlying infection.   Assessment & Plan:   Principal Problem:   HCAP (healthcare-associated pneumonia) Active Problems:   Depression   Hyperthyroidism   GAD (generalized anxiety disorder)   Primary lung adenocarcinoma (HCC)   Normocytic anemia  Acute on subacute hypoxic respiratory failure, poa Concern for worsening adenocarcinoma of the lung Rule out infectious process underlying SpO2: 94 % O2 Flow Rate (L/min): 15 L/min FiO2 (%): 100 % Transition off nebs given jittery and anxiety feeling -will give 1 dose of methylprednisolone and follow clinically, if symptomatically improving would continue methylprednisolone Likely worsening adenocarcinoma as below. Patient currently on broad-spectrum antibiotics, with ongoing leukemoid reaction but remains afebrile PCCM/pulmonology  following, appreciate insight and recommendations about patient's imaging and guidance on what appears to be worsening airspace disease in the setting of lung cancer Vancomycin/Cefepime at intake - MRSA nares negative, discontinue vancomycin Strep pneumoniae urinary antigen negative Procalcitonin < 0.10 favoring worsening adenocarcinoma over infection  Primary lung adenocarcinoma ([place]) She was scheduled for PET scan 07/14/19 Discussed the case with Dr. Mohammed/oncology team, who are awaiting further genetic markers and lab work to further delineate patient's treatment plan, appears to be planning chemotherapy this definitive diagnosis can be made in hopes to give patient p.o. chemotherapy  Depression Generalized anxiety disorder Continue home medications including hydroxyzine Continue as needed benzodiazepines today given patient's worsening anxiety in the setting of above Patient reports generally improving anxiety on both medications  Hyperthyroidism  Continue home medications including Tapazole, and propranolol.  Normocytic anemia Iron deficient on previous admission anemia panel. Monitor hematocrit and hemoglobin.  Thrombocytosis Likely reactive given above, follow with morning labs   DVT prophylaxis: Lovenox SQ Code Status: Full code. Family Communication: Disposition Plan:  Remains as inpatient, worsening hypoxic respiratory failure in the setting of primary lung adenocarcinoma as above, currently ruling out infectious process underlying.  Patient's markedly abnormal CT chest expect prolonged hospital stay and marked hypoxia requiring close observation further evaluation and possible procedure pending specialist evaluation Consults called: PCCM, oncology Admission status: Inpatient/stepdown  Procedures:   None  Antimicrobials:  Cefepime 07/14/19-->ongoing Vancomycin initiated 07/14/2019 --> discontinued 07/15/2019  Subjective: No acute issues or events overnight, denies  nausea, vomiting, chest pain, headache, fevers, chills.  Objective: Vitals:   07/16/19 2339 07/17/19 0000 07/17/19 0400 07/17/19 0417  BP:    128/82  Pulse: 80   87  Resp: (!) 34   (!) 29  Temp:  (!) 97.3 F (36.3 C) (!) 97.4 F (36.3 C)  TempSrc:  Axillary Axillary   SpO2: 94%   94%  Weight:      Height:        Intake/Output Summary (Last 24 hours) at 07/17/2019 0703 Last data filed at 07/17/2019 6503 Gross per 24 hour  Intake 2832.94 ml  Output 2900 ml  Net -67.06 ml   Filed Weights   07/14/19 0851  Weight: 86.6 kg    Examination:  General:  Pleasantly resting in bed, No acute distress. HEENT:  Normocephalic atraumatic.  Sclerae nonicteric, noninjected.  Extraocular movements intact bilaterally. Neck:  Without mass or deformity.  Trachea is midline. Lungs: Diffuse coarse breath sounds bilaterally, diminished breath sounds bibasilarly with end expiratory rhonchi biapically. Heart:  Regular rate and rhythm.  Without murmurs, rubs, or gallops. Abdomen:  Soft, nontender, nondistended.  Without guarding or rebound. Extremities: Without cyanosis, clubbing, edema, or obvious deformity. Vascular:  Dorsalis pedis and posterior tibial pulses palpable bilaterally. Skin:  Warm and dry, no erythema, no ulcerations.  Data Reviewed: I have personally reviewed following labs and imaging studies  CBC: Recent Labs  Lab 07/14/19 0915 07/15/19 0249 07/16/19 0313 07/17/19 0309  WBC 18.2* 10.6* 21.8* 13.7*  NEUTROABS 14.3* 8.7* 17.1* 10.1*  HGB 11.5* 10.7* 12.1 11.1*  HCT 37.2 35.4* 38.3 35.9*  MCV 87.5 88.9 86.1 88.0  PLT 620* 561* 639* 546*   Basic Metabolic Panel: Recent Labs  Lab 07/14/19 0915 07/14/19 1126 07/15/19 0249 07/16/19 0313 07/17/19 0309  NA 136  --  139 140 137  K 4.3  --  4.0 4.4 3.9  CL 103  --  108 108 106  CO2 22  --  21* 23 21*  GLUCOSE 107*  --  130* 109* 91  BUN 7  --  8 10 9   CREATININE 0.57  --  0.39* 0.51 0.45  CALCIUM 8.8*  --  8.9 8.7* 8.4*   MG  --  1.9  --   --   --   PHOS  --  4.2  --   --   --    GFR: Estimated Creatinine Clearance: 120.6 mL/min (by C-G formula based on SCr of 0.45 mg/dL). Liver Function Tests: Recent Labs  Lab 07/14/19 0915  AST 27  ALT 26  ALKPHOS 89  BILITOT 0.6  PROT 7.6  ALBUMIN 2.7*   No results for input(s): LIPASE, AMYLASE in the last 168 hours. No results for input(s): AMMONIA in the last 168 hours. Coagulation Profile: No results for input(s): INR, PROTIME in the last 168 hours. Cardiac Enzymes: No results for input(s): CKTOTAL, CKMB, CKMBINDEX, TROPONINI in the last 168 hours. BNP (last 3 results) No results for input(s): PROBNP in the last 8760 hours. HbA1C: No results for input(s): HGBA1C in the last 72 hours. CBG: No results for input(s): GLUCAP in the last 168 hours. Lipid Profile: No results for input(s): CHOL, HDL, LDLCALC, TRIG, CHOLHDL, LDLDIRECT in the last 72 hours. Thyroid Function Tests: No results for input(s): TSH, T4TOTAL, FREET4, T3FREE, THYROIDAB in the last 72 hours. Anemia Panel: No results for input(s): VITAMINB12, FOLATE, FERRITIN, TIBC, IRON, RETICCTPCT in the last 72 hours. Sepsis Labs: Recent Labs  Lab 07/14/19 1126  PROCALCITON <0.10    Recent Results (from the past 240 hour(s))  Blood culture (routine x 2)     Status: None (Preliminary result)   Collection Time: 07/14/19  9:14 AM   Specimen: BLOOD  Result Value Ref Range Status   Specimen Description   Final    BLOOD RIGHT  ANTECUBITAL Performed at [facility], [place] [address], [place], [place] [address]    Special Requests   Final    BOTTLES DRAWN AEROBIC AND ANAEROBIC Blood Culture adequate volume Performed at [facility] [address], [place], [place] [address]    Culture   Final    NO GROWTH 2 DAYS Performed at [facility] [address] [place], [place] [address]    Report Status PENDING  Incomplete  Blood culture (routine x 2)      Status: None (Preliminary result)   Collection Time: 07/14/19 10:15 AM   Specimen: BLOOD  Result Value Ref Range Status   Specimen Description   Final    BLOOD LEFT ANTECUBITAL Performed at [facility] [address], [place], [place] [address]    Special Requests   Final    BOTTLES DRAWN AEROBIC AND ANAEROBIC Blood Culture results may not be optimal due to an excessive volume of blood received in culture bottles Performed at [facility] [address], [place], [place] [address]    Culture   Final    NO GROWTH 2 DAYS Performed at [facility] [address] [place], [place] [address]    Report Status PENDING  Incomplete  SARS CORONAVIRUS 2 (TAT 6-24 HRS) Nasopharyngeal Nasopharyngeal Swab     Status: None   Collection Time: 07/14/19  2:45 PM   Specimen: Nasopharyngeal Swab  Result Value Ref Range Status   SARS Coronavirus 2 NEGATIVE NEGATIVE Final    Comment: (NOTE) SARS-CoV-2 target nucleic acids are NOT DETECTED. The SARS-CoV-2 RNA is generally detectable in upper and lower respiratory specimens during the acute phase of infection. Negative results do not preclude SARS-CoV-2 infection, do not rule out co-infections with other pathogens, and should not be used as the sole basis for treatment or other patient management decisions. Negative results must be combined with clinical observations, patient history, and epidemiological information. The expected result is Negative. Fact Sheet for Patients: SugarRoll.be Fact Sheet for Healthcare Providers: https://www.woods-mathews.com/ This test is not yet approved or cleared by the [place] FDA and  has been authorized for detection and/or diagnosis of SARS-CoV-2 by FDA under an Emergency Use Authorization (EUA). This EUA will remain  in effect (meaning this test can be used) for the duration of the COVID-19 declaration under Section 56  4(b)(1) of the Act, 21 U.S.C. section 360bbb-3(b)(1), unless the authorization is terminated or revoked sooner. Performed at [facility], [place] [address] [place], [place] [address]   MRSA PCR Screening     Status: None   Collection Time: 07/14/19  8:59 PM   Specimen: Nasal Mucosa; Nasopharyngeal  Result Value Ref Range Status   MRSA by PCR NEGATIVE NEGATIVE Final    Comment:        The GeneXpert MRSA Assay (FDA approved for NASAL specimens only), is one component of a comprehensive MRSA colonization surveillance program. It is not intended to diagnose MRSA infection nor to guide or monitor treatment for MRSA infections. Performed at [facility], [place] [address], [place], [place] [address]     Radiology Studies: No results found.  Scheduled Meds: . Chlorhexidine Gluconate Cloth  6 each Topical Daily  . enoxaparin (LOVENOX) injection  40 mg Subcutaneous Q24H  . guaiFENesin  600 mg Oral BID  . lip balm      . mouth rinse  15 mL Mouth Rinse BID  . methimazole  5 mg Oral TID  . propranolol  40 mg Oral TID   Continuous Infusions: . sodium chloride Stopped (07/17/19 0539)  . ceFEPime (MAXIPIME) IV Stopped (07/17/19 7225)     LOS: 3 days   Time spent: 63min  Regino Fournet C Ramelo Oetken, DO [facility]  If 7PM-7AM, please contact night-coverage [email or web address]  07/17/2019, 7:03 AM

## 2019-07-17 NOTE — Telephone Encounter (Signed)
[facility]- Urgent request for results -Sarah Chandler said she will expedite it and should be ready approx 4/15.  [facility] - Per Merry Proud he will expedite getting results , ,otherwise it will Monday.

## 2019-07-18 NOTE — Progress Notes (Addendum)
PROGRESS NOTE    Sarah Chandler  YNW:[id number] DOB: September 25, 1985 DOA: 07/14/2019 PCP: Mellody Dance, DO   Brief Narrative:  Sarah Chandler is a 34 y.o. female with medical history significant of lung adenocarcinoma, recently discharged for community-acquired pneumonia who is coming to the emergency department via EMS with progressively worse dyspnea.  She has had chills and night sweats.  She denies she was admitted for a week from 07/01/2019 until 07/08/2019 due to multifocal pneumonia and was discharged home on oxygen.  She continues to have productive cough, fatigue and malaise.  She denies fever or hemoptysis.  She denies headache, sore throat, rhinorrhea, chest pain, palpitations, dizziness, diaphoresis, PND, pitting edema of the lower extremities, abdominal pain, nausea, vomiting, diarrhea, constipation, melena or hematochezia.  No dysuria, frequency.  No polyuria, polydipsia, polyphagia or blurred vision.  Patient admitted via the ED on 07/14/2019 for acute hypoxic respiratory failure in the setting of bilateral diffuse airspace disease concerning for acutely worsening adenocarcinoma versus possible underlying infection.   Assessment & Plan:   Principal Problem:   HCAP (healthcare-associated pneumonia) Active Problems:   Depression   Hyperthyroidism   GAD (generalized anxiety disorder)   Primary lung adenocarcinoma (HCC)   Normocytic anemia  Acute on subacute hypoxic respiratory failure, poa Concern for worsening adenocarcinoma of the lung Unlikely infectious process SpO2: 99 % O2 Flow Rate (L/min): 8 L/min FiO2 (%): 100 % Patient markedly improved after single dose of 125 mg methylprednisolone, continue 40 every 6h at this point until we can initiate treatment with oncology, pending adenocarcinoma genetic markers Likely worsening adenocarcinoma as below. Patient initially on broad-spectrum antibiotics, with ongoing leukemoid reaction but remains afebrile -discontinue antibiotics  for 07/18/2019 PCCM/pulmonology previously following, appreciate insight and recommendations about patient's imaging and guidance on what appears to be worsening airspace disease in the setting of lung cancer and not truly infectious process Vancomycin/Cefepime at intake -discontinued 07/18/2019 Strep pneumoniae urinary antigen negative Procalcitonin < 0.10 favoring worsening adenocarcinoma/inflammatory process over infection  Primary lung adenocarcinoma ([place]) She was scheduled for PET scan 07/14/19 Discussed the case with Dr. Mohammed/oncology team, who are awaiting further genetic markers and lab work to further delineate patient's treatment plan, appears to be planning chemotherapy this definitive diagnosis can be made in hopes to give patient p.o. chemotherapy  Depression Generalized anxiety disorder Continue home medications including hydroxyzine Continue as needed benzodiazepines given patient's worsening anxiety in the setting of above Patient reports generally improving anxiety on both medications  Hyperthyroidism  Continue home medications including Tapazole, and propranolol.  Normocytic anemia Iron deficient on previous admission anemia panel. Monitor hematocrit and hemoglobin.  Thrombocytosis Likely reactive given above, follow with morning labs   DVT prophylaxis: Lovenox SQ Code Status: Full code. Family Communication: Disposition Plan:  Remains as inpatient, worsening hypoxic respiratory failure in the setting of primary lung adenocarcinoma as above, currently ruling out infectious process underlying.  Patient's markedly abnormal CT chest expect prolonged hospital stay and marked hypoxia requiring close observation further evaluation and possible procedure pending specialist evaluation Consults called: PCCM, oncology Admission status: Inpatient/stepdown  Procedures:   None  Antimicrobials:  Cefepime 07/14/19-->ongoing Vancomycin initiated 07/14/2019 --> discontinued  07/15/2019  Subjective: No acute issues or events overnight, patient indicates her respiratory status is markedly improving, was able to ambulate around her room yesterday without severe dyspnea which was her baseline over the last few days.  Today appears to be in much better hopes, anxiety medication helping, somewhat anxious about initiating chemotherapy but hopeful it  will improve her symptoms that she would be able to be discharged in the next few days.  Objective: Vitals:   07/17/19 2337 07/18/19 0000 07/18/19 0400 07/18/19 0447  BP: 108/65   131/69  Pulse: 80   69  Resp: (!) 25   20  Temp:  (!) 97.4 F (36.3 C) 98 F (36.7 C)   TempSrc:  Axillary Axillary   SpO2: 97%   99%  Weight:      Height:        Intake/Output Summary (Last 24 hours) at 07/18/2019 0705 Last data filed at 07/18/2019 0600 Gross per 24 hour  Intake 2338.56 ml  Output 1750 ml  Net 588.56 ml   Filed Weights   07/14/19 0851  Weight: 86.6 kg    Examination:  General:  Pleasantly resting in bed, No acute distress. HEENT:  Normocephalic atraumatic.  Sclerae nonicteric, noninjected.  Extraocular movements intact bilaterally. Neck:  Without mass or deformity.  Trachea is midline. Lungs: Diffuse coarse breath sounds bilaterally, diminished breath sounds bibasilarly with end expiratory rhonchi biapically. Heart:  Regular rate and rhythm.  Without murmurs, rubs, or gallops. Abdomen:  Soft, nontender, nondistended.  Without guarding or rebound. Extremities: Without cyanosis, clubbing, edema, or obvious deformity. Vascular:  Dorsalis pedis and posterior tibial pulses palpable bilaterally. Skin:  Warm and dry, no erythema, no ulcerations.  Data Reviewed: I have personally reviewed following labs and imaging studies  CBC: Recent Labs  Lab 07/14/19 0915 07/15/19 0249 07/16/19 0313 07/17/19 0309  WBC 18.2* 10.6* 21.8* 13.7*  NEUTROABS 14.3* 8.7* 17.1* 10.1*  HGB 11.5* 10.7* 12.1 11.1*  HCT 37.2 35.4* 38.3  35.9*  MCV 87.5 88.9 86.1 88.0  PLT 620* 561* 639* 333*   Basic Metabolic Panel: Recent Labs  Lab 07/14/19 0915 07/14/19 1126 07/15/19 0249 07/16/19 0313 07/17/19 0309  NA 136  --  139 140 137  K 4.3  --  4.0 4.4 3.9  CL 103  --  108 108 106  CO2 22  --  21* 23 21*  GLUCOSE 107*  --  130* 109* 91  BUN 7  --  8 10 9   CREATININE 0.57  --  0.39* 0.51 0.45  CALCIUM 8.8*  --  8.9 8.7* 8.4*  MG  --  1.9  --   --   --   PHOS  --  4.2  --   --   --    GFR: Estimated Creatinine Clearance: 120.6 mL/min (by C-G formula based on SCr of 0.45 mg/dL). Liver Function Tests: Recent Labs  Lab 07/14/19 0915  AST 27  ALT 26  ALKPHOS 89  BILITOT 0.6  PROT 7.6  ALBUMIN 2.7*   No results for input(s): LIPASE, AMYLASE in the last 168 hours. No results for input(s): AMMONIA in the last 168 hours. Coagulation Profile: No results for input(s): INR, PROTIME in the last 168 hours. Cardiac Enzymes: No results for input(s): CKTOTAL, CKMB, CKMBINDEX, TROPONINI in the last 168 hours. BNP (last 3 results) No results for input(s): PROBNP in the last 8760 hours. HbA1C: No results for input(s): HGBA1C in the last 72 hours. CBG: No results for input(s): GLUCAP in the last 168 hours. Lipid Profile: No results for input(s): CHOL, HDL, LDLCALC, TRIG, CHOLHDL, LDLDIRECT in the last 72 hours. Thyroid Function Tests: No results for input(s): TSH, T4TOTAL, FREET4, T3FREE, THYROIDAB in the last 72 hours. Anemia Panel: No results for input(s): VITAMINB12, FOLATE, FERRITIN, TIBC, IRON, RETICCTPCT in the last 72 hours. Sepsis Labs:  Recent Labs  Lab 07/14/19 1126  PROCALCITON <0.10    Recent Results (from the past 240 hour(s))  Blood culture (routine x 2)     Status: None (Preliminary result)   Collection Time: 07/14/19  9:14 AM   Specimen: BLOOD  Result Value Ref Range Status   Specimen Description   Final    BLOOD RIGHT ANTECUBITAL Performed at [facility] [address], [place], [place] [address]    Special Requests   Final    BOTTLES DRAWN AEROBIC AND ANAEROBIC Blood Culture adequate volume Performed at [facility] [address], [place], [place] [address]    Culture   Final    NO GROWTH 3 DAYS Performed at [facility], [place] [address] [place], [place] [address]    Report Status PENDING  Incomplete  Blood culture (routine x 2)     Status: None (Preliminary result)   Collection Time: 07/14/19 10:15 AM   Specimen: BLOOD  Result Value Ref Range Status   Specimen Description   Final    BLOOD LEFT ANTECUBITAL Performed at [facility] [address], [place], [place] [address]    Special Requests   Final    BOTTLES DRAWN AEROBIC AND ANAEROBIC Blood Culture results may not be optimal due to an excessive volume of blood received in culture bottles Performed at [facility] [address], [place], [place] [address]    Culture   Final    NO GROWTH 3 DAYS Performed at [facility], [place] [address], [place], [place] [address]    Report Status PENDING  Incomplete  SARS CORONAVIRUS 2 (TAT 6-24 HRS) Nasopharyngeal Nasopharyngeal Swab     Status: None   Collection Time: 07/14/19  2:45 PM   Specimen: Nasopharyngeal Swab  Result Value Ref Range Status   SARS Coronavirus 2 NEGATIVE NEGATIVE Final    Comment: (NOTE) SARS-CoV-2 target nucleic acids are NOT DETECTED. The SARS-CoV-2 RNA is generally detectable in upper and lower respiratory specimens during the acute phase of infection. Negative results do not preclude SARS-CoV-2 infection, do not rule out co-infections with other pathogens, and should not be used as the sole basis for treatment or other patient management decisions. Negative results must be combined with clinical observations, patient history, and epidemiological information. The expected result is Negative. Fact Sheet for  Patients: SugarRoll.be Fact Sheet for Healthcare Providers: https://www.woods-mathews.com/ This test is not yet approved or cleared by the [place] FDA and  has been authorized for detection and/or diagnosis of SARS-CoV-2 by FDA under an Emergency Use Authorization (EUA). This EUA will remain  in effect (meaning this test can be used) for the duration of the COVID-19 declaration under Section 56 4(b)(1) of the Act, 21 U.S.C. section 360bbb-3(b)(1), unless the authorization is terminated or revoked sooner. Performed at [facility], [place] [address] [place], [place] [address]   MRSA PCR Screening     Status: None   Collection Time: 07/14/19  8:59 PM   Specimen: Nasal Mucosa; Nasopharyngeal  Result Value Ref Range Status   MRSA by PCR NEGATIVE NEGATIVE Final    Comment:        The GeneXpert MRSA Assay (FDA approved for NASAL specimens only), is one component of a comprehensive MRSA colonization surveillance program. It is not intended to diagnose MRSA infection nor to guide or monitor treatment for MRSA infections. Performed at [facility], [place] [address]  [place], [place] [address]     Radiology Studies: No results found.  Scheduled Meds: . Chlorhexidine Gluconate Cloth  6 each Topical Daily  . enoxaparin (LOVENOX) injection  40 mg Subcutaneous Q24H  . guaiFENesin  600 mg Oral BID  . loratadine  10 mg Oral Daily  . mouth rinse  15 mL Mouth Rinse BID  . methimazole  5 mg Oral TID  . methylPREDNISolone (SOLU-MEDROL) injection  40 mg Intravenous Q6H  . propranolol  40 mg Oral TID   Continuous Infusions: . sodium chloride 88 mL/hr at 07/18/19 0600  . ceFEPime (MAXIPIME) IV Stopped (07/18/19 0552)     LOS: 4 days   Time spent: 11min  Comfort Iversen C Corena Tilson, DO [facility]  If 7PM-7AM, please contact night-coverage [email or web address]  07/18/2019, 7:05 AM

## 2019-07-19 LAB — CULTURE, BLOOD (ROUTINE X 2)
Culture: NO GROWTH
Culture: NO GROWTH
Special Requests: ADEQUATE

## 2019-07-19 NOTE — Progress Notes (Signed)
PROGRESS NOTE    Sarah Chandler  XBM:[id number] DOB: 1985-06-08 DOA: 07/14/2019 PCP: Mellody Dance, DO   Brief Narrative:  Sarah Chandler is a 34 y.o. female with medical history significant of lung adenocarcinoma, recently discharged for community-acquired pneumonia who is coming to the emergency department via EMS with progressively worse dyspnea.  She has had chills and night sweats.  She denies she was admitted for a week from 07/01/2019 until 07/08/2019 due to multifocal pneumonia and was discharged home on oxygen.  She continues to have productive cough, fatigue and malaise.  She denies fever or hemoptysis.  She denies headache, sore throat, rhinorrhea, chest pain, palpitations, dizziness, diaphoresis, PND, pitting edema of the lower extremities, abdominal pain, nausea, vomiting, diarrhea, constipation, melena or hematochezia.  No dysuria, frequency.  No polyuria, polydipsia, polyphagia or blurred vision.  Patient admitted via the ED on 07/14/2019 for acute hypoxic respiratory failure in the setting of bilateral diffuse airspace disease concerning for acutely worsening adenocarcinoma versus possible underlying infection.   Assessment & Plan:   Principal Problem:   HCAP (healthcare-associated pneumonia) Active Problems:   Depression   Hyperthyroidism   GAD (generalized anxiety disorder)   Primary lung adenocarcinoma (HCC)   Normocytic anemia   Acute on subacute hypoxic respiratory failure, poa Concern for worsening adenocarcinoma of the lung Unlikely infectious process SpO2: 95 % O2 Flow Rate (L/min): 10 L/min FiO2 (%): 45 % Patient markedly improved after single dose of 125 mg methylprednisolone, continue 40 every 6h at this point until we can initiate treatment with oncology, pending adenocarcinoma genetic markers Likely worsening adenocarcinoma as below and not infection. Patient initially on broad-spectrum antibiotics, with ongoing leukemoid reaction in the setting of steroids  but remains afebrile -discontinue antibiotics 07/18/2019 PCCM/pulmonology previously following, appreciate insight and recommendations about patient's imaging and guidance on what appears to be worsening airspace disease in the setting of lung cancer and not truly an infectious process Vancomycin/Cefepime at intake -discontinued 07/18/2019 Strep pneumoniae urinary antigen negative Procalcitonin < 0.10 favoring adenocarcinoma/inflammatory process over infection  Primary lung adenocarcinoma ([place]) She was scheduled for PET scan 07/14/19 Dr. Mohammed/oncology team following along, awaiting further genetic markers and lab work to further delineate patient's treatment plan, appears to be planning chemotherapy this definitive diagnosis can be made in hopes to give patient p.o. chemotherapy  Depression Generalized anxiety disorder Continue home medications including hydroxyzine Continue as needed benzodiazepines given patient's worsening anxiety in the setting of above Patient reports generally improving anxiety on both medications  Hyperthyroidism  Continue home medications including Tapazole, and propranolol.  Normocytic anemia Iron deficient on previous admission anemia panel. Monitor hematocrit and hemoglobin.  Thrombocytosis Likely reactive given above, follow with morning labs   DVT prophylaxis: Lovenox SQ Code Status: Full code. Family Communication: Disposition Plan:  Remains as inpatient, moderate to severe hypoxic respiratory failure in the setting of primary lung adenocarcinoma as above.  Given patient's markedly abnormal CT chest and profound hypoxia we expect prolonged hospital stay and marked hypoxia requiring close observation further evaluation and possible procedure pending specialist evaluation Consults called: PCCM, oncology Admission status: Inpatient/stepdown  Procedures:   None  Antimicrobials:  Cefepime 07/14/19--> discontinued 07/18/2019 Vancomycin initiated  07/14/2019 --> discontinued 07/15/2019  Subjective: No acute issues or events overnight indicates she feels much improved after steroid initiation still hypoxic and dyspneic with exertion but now at least able to ambulate to bedside chair and tolerate less oxygen.  Denies nausea, vomiting, diarrhea, constipation, chest pain, headache, fevers, chills.  Objective:  Vitals:   07/19/19 0700 07/19/19 0800 07/19/19 0900 07/19/19 1000  BP:    132/87  Pulse: 71 90 66 75  Resp: (!) 21 (!) 30 (!) 26 (!) 26  Temp:  98 F (36.7 C)    TempSrc:  Axillary    SpO2: 96% 98% 97% 95%  Weight:      Height:        Intake/Output Summary (Last 24 hours) at 07/19/2019 1013 Last data filed at 07/19/2019 0600 Gross per 24 hour  Intake 2255.4 ml  Output 1150 ml  Net 1105.4 ml   Filed Weights   07/14/19 0851  Weight: 86.6 kg    Examination:  General:  Pleasantly resting in bed, No acute distress. HEENT:  Normocephalic atraumatic.  Sclerae nonicteric, noninjected.  Extraocular movements intact bilaterally. Neck:  Without mass or deformity.  Trachea is midline. Lungs: Diffuse coarse breath sounds bilaterally, diminished breath sounds bibasilarly with end expiratory rhonchi biapically. Heart:  Regular rate and rhythm.  Without murmurs, rubs, or gallops. Abdomen:  Soft, nontender, nondistended.  Without guarding or rebound. Extremities: Without cyanosis, clubbing, edema, or obvious deformity. Vascular:  Dorsalis pedis and posterior tibial pulses palpable bilaterally. Skin:  Warm and dry, no erythema, no ulcerations.  Data Reviewed: I have personally reviewed following labs and imaging studies  CBC: Recent Labs  Lab 07/14/19 0915 07/15/19 0249 07/16/19 0313 07/17/19 0309  WBC 18.2* 10.6* 21.8* 13.7*  NEUTROABS 14.3* 8.7* 17.1* 10.1*  HGB 11.5* 10.7* 12.1 11.1*  HCT 37.2 35.4* 38.3 35.9*  MCV 87.5 88.9 86.1 88.0  PLT 620* 561* 639* 854*   Basic Metabolic Panel: Recent Labs  Lab 07/14/19 0915  07/14/19 1126 07/15/19 0249 07/16/19 0313 07/17/19 0309  NA 136  --  139 140 137  K 4.3  --  4.0 4.4 3.9  CL 103  --  108 108 106  CO2 22  --  21* 23 21*  GLUCOSE 107*  --  130* 109* 91  BUN 7  --  8 10 9   CREATININE 0.57  --  0.39* 0.51 0.45  CALCIUM 8.8*  --  8.9 8.7* 8.4*  MG  --  1.9  --   --   --   PHOS  --  4.2  --   --   --    GFR: Estimated Creatinine Clearance: 120.6 mL/min (by C-G formula based on SCr of 0.45 mg/dL). Liver Function Tests: Recent Labs  Lab 07/14/19 0915  AST 27  ALT 26  ALKPHOS 89  BILITOT 0.6  PROT 7.6  ALBUMIN 2.7*   No results for input(s): LIPASE, AMYLASE in the last 168 hours. No results for input(s): AMMONIA in the last 168 hours. Coagulation Profile: No results for input(s): INR, PROTIME in the last 168 hours. Cardiac Enzymes: No results for input(s): CKTOTAL, CKMB, CKMBINDEX, TROPONINI in the last 168 hours. BNP (last 3 results) No results for input(s): PROBNP in the last 8760 hours. HbA1C: No results for input(s): HGBA1C in the last 72 hours. CBG: No results for input(s): GLUCAP in the last 168 hours. Lipid Profile: No results for input(s): CHOL, HDL, LDLCALC, TRIG, CHOLHDL, LDLDIRECT in the last 72 hours. Thyroid Function Tests: No results for input(s): TSH, T4TOTAL, FREET4, T3FREE, THYROIDAB in the last 72 hours. Anemia Panel: No results for input(s): VITAMINB12, FOLATE, FERRITIN, TIBC, IRON, RETICCTPCT in the last 72 hours. Sepsis Labs: Recent Labs  Lab 07/14/19 1126  PROCALCITON <0.10    Recent Results (from the past 240 hour(s))  Blood culture (routine x 2)     Status: None (Preliminary result)   Collection Time: 07/14/19  9:14 AM   Specimen: BLOOD  Result Value Ref Range Status   Specimen Description   Final    BLOOD RIGHT ANTECUBITAL Performed at [facility] [address], [place], [place] [address]    Special Requests   Final    BOTTLES DRAWN AEROBIC AND ANAEROBIC Blood Culture adequate  volume Performed at [facility] [address] [place], [place] [address]    Culture   Final    NO GROWTH 4 DAYS Performed at [facility], [place] [address], [place], [place] [address]    Report Status PENDING  Incomplete  Blood culture (routine x 2)     Status: None (Preliminary result)   Collection Time: 07/14/19 10:15 AM   Specimen: BLOOD  Result Value Ref Range Status   Specimen Description   Final    BLOOD LEFT ANTECUBITAL Performed at [facility] [address], [place], [place] [address]    Special Requests   Final    BOTTLES DRAWN AEROBIC AND ANAEROBIC Blood Culture results may not be optimal due to an excessive volume of blood received in culture bottles Performed at [facility] [address], [place], [place] [address]    Culture   Final    NO GROWTH 4 DAYS Performed at [facility], [place] [address], [place], [place] [address]    Report Status PENDING  Incomplete  SARS CORONAVIRUS 2 (TAT 6-24 HRS) Nasopharyngeal Nasopharyngeal Swab     Status: None   Collection Time: 07/14/19  2:45 PM   Specimen: Nasopharyngeal Swab  Result Value Ref Range Status   SARS Coronavirus 2 NEGATIVE NEGATIVE Final    Comment: (NOTE) SARS-CoV-2 target nucleic acids are NOT DETECTED. The SARS-CoV-2 RNA is generally detectable in upper and lower respiratory specimens during the acute phase of infection. Negative results do not preclude SARS-CoV-2 infection, do not rule out Chandler-infections with other pathogens, and should not be used as the sole basis for treatment or other patient management decisions. Negative results must be combined with clinical observations, patient history, and epidemiological information. The expected result is Negative. Fact Sheet for Patients: SugarRoll.be Fact Sheet for Healthcare Providers: https://www.woods-mathews.com/ This test is not yet approved  or cleared by the [place] FDA and  has been authorized for detection and/or diagnosis of SARS-CoV-2 by FDA under an Emergency Use Authorization (EUA). This EUA will remain  in effect (meaning this test can be used) for the duration of the COVID-19 declaration under Section 56 4(b)(1) of the Act, 21 U.S.C. section 360bbb-3(b)(1), unless the authorization is terminated or revoked sooner. Performed at [facility], [place] [address] [place], [place] [address]   MRSA PCR Screening     Status: None   Collection Time: 07/14/19  8:59 PM   Specimen: Nasal Mucosa; Nasopharyngeal  Result Value Ref Range Status   MRSA by PCR NEGATIVE NEGATIVE Final    Comment:        The GeneXpert MRSA Assay (FDA approved for NASAL specimens only), is one component of a comprehensive MRSA colonization surveillance program. It is not intended to diagnose MRSA infection nor to guide or monitor treatment for MRSA infections. Performed at [facility], [place] [address] [place], [place] [address]     Radiology Studies: No results found.  Scheduled Meds: . Chlorhexidine Gluconate Cloth  6 each Topical Daily  . enoxaparin (LOVENOX) injection  40 mg Subcutaneous Q24H  . guaiFENesin  600 mg Oral BID  . loratadine  10 mg Oral Daily  . mouth rinse  15 mL Mouth Rinse BID  . methimazole  5 mg Oral TID  . methylPREDNISolone (SOLU-MEDROL) injection  40 mg Intravenous Q6H  . propranolol  40 mg Oral TID   Continuous Infusions: . sodium chloride 10 mL/hr at 07/19/19 0832     LOS: 5 days   Time spent: 52min  Sarah Grudzien C Celvin Taney, DO [facility]  If 7PM-7AM, please contact night-coverage [email or web address]  07/19/2019, 10:13 AM

## 2019-07-20 ENCOUNTER — Telehealth: Payer: Self-pay | Admitting: Pharmacist

## 2019-07-20 ENCOUNTER — Telehealth: Payer: Self-pay

## 2019-07-20 ENCOUNTER — Inpatient Hospital Stay: Payer: Medicaid Other | Admitting: Physician Assistant

## 2019-07-20 DIAGNOSIS — C3490 Malignant neoplasm of unspecified part of unspecified bronchus or lung: Secondary | ICD-10-CM

## 2019-07-20 LAB — COMPREHENSIVE METABOLIC PANEL
ALT: 77 U/L — ABNORMAL HIGH (ref 0–44)
AST: 50 U/L — ABNORMAL HIGH (ref 15–41)
Albumin: 2.8 g/dL — ABNORMAL LOW (ref 3.5–5.0)
Alkaline Phosphatase: 81 U/L (ref 38–126)
Anion gap: 11 (ref 5–15)
BUN: 14 mg/dL (ref 6–20)
CO2: 23 mmol/L (ref 22–32)
Calcium: 8.8 mg/dL — ABNORMAL LOW (ref 8.9–10.3)
Chloride: 104 mmol/L (ref 98–111)
Creatinine, Ser: 0.38 mg/dL — ABNORMAL LOW (ref 0.44–1.00)
GFR calc Af Amer: >60 mL/min (ref >60)
GFR calc non Af Amer: >60 mL/min (ref >60)
Glucose, Bld: 121 mg/dL — ABNORMAL HIGH (ref 70–99)
Potassium: 4.6 mmol/L (ref 3.5–5.1)
Sodium: 138 mmol/L (ref 135–145)
Total Bilirubin: 0.5 mg/dL (ref 0.3–1.2)
Total Protein: 7.3 g/dL (ref 6.5–8.1)

## 2019-07-20 LAB — CBC
HCT: 41.1 % (ref 36.0–46.0)
Hemoglobin: 12.6 g/dL (ref 12.0–15.0)
MCH: 27.4 pg (ref 26.0–34.0)
MCHC: 30.7 g/dL (ref 30.0–36.0)
MCV: 89.3 fL (ref 80.0–100.0)
Platelets: 682 10*3/uL — ABNORMAL HIGH (ref 150–400)
RBC: 4.6 MIL/uL (ref 3.87–5.11)
RDW: 15.9 % — ABNORMAL HIGH (ref 11.5–15.5)
WBC: 20.3 10*3/uL — ABNORMAL HIGH (ref 4.0–10.5)
nRBC: 0 % (ref 0.0–0.2)

## 2019-07-20 NOTE — Telephone Encounter (Signed)
Oral Oncology Patient Advocate Encounter  Met patient in ICU room to complete application for [place] in an effort to reduce patient's out of pocket expense for Alecensa to $0.    Application completed and faxed to [phone].   [employer] patient assistance phone number for follow up is [phone].   This encounter will be updated until final determination.   [place] Patient [place] Phone [phone] Fax [phone] 07/20/2019 2:22 PM

## 2019-07-20 NOTE — IPOC Note (Signed)
Pt was up and watching TV on phone and heart rate dropped (bradycardia) but was asymptomatic. Continue to monitor patient for other symptoms and heart rate

## 2019-07-20 NOTE — Telephone Encounter (Signed)
Oral Oncology Pharmacist Encounter  Received new prescription for Alecensa (alectinib) for the treatment of metastatic NSCLC, adenocarcimona, ALK positive, planned duration until disease progression or unacceptable drug toxicity.  CMP from 07/20/19 assessed, no relevant lab abnormalities. Prescription dose and frequency assessed.   Current medication list in Epic reviewed, one DDIs with alectinib identified: -Alectinib can cause bradycardia which may enhance the bradycardic affect of propranolol. Monitors heart rate and BP more closely and monitor for s/sx of bradycardia (eg. increased fatigue, syncope)  Patient uninsured and will need to apply for manufacturer assistance. Also attempting to acquire samples for the patient from the rep.  Darl Pikes, PharmD, BCPS, BCOP, CPP Hematology/Oncology Clinical Pharmacist [facility] [phone]  07/20/2019 9:43 AM

## 2019-07-20 NOTE — Telephone Encounter (Signed)
Oral Chemotherapy Pharmacist Encounter  Educated patient while she was inpatient at [facility] on 07/20/19. We are waiting for Alecensa samples to arrive at the cancer for the patient.  Patient Education I spoke with patient for overview of new oral chemotherapy medication: Alecensa (alectinib) for the treatment of metastatic NSCLC, adenocarcimona, ALK positive, planned duration until disease progression or unacceptable drug toxicity.   Counseled patient on administration, dosing, side effects, monitoring, drug-food interactions, safe handling, storage, and disposal. Patient will take 600 mg by mouth 2 (two) times daily with a meal.  Side effects include but not limited to: fatigue, constipation, edema, decreased hgb.    Reviewed with patient importance of keeping a medication schedule and plan for any missed doses.  [name] voiced understanding and appreciation. All questions answered. Medication handout provided.  Provided patient with [facility] phone number. Patient knows to call the office with questions or concerns. Oral [facility]otherapy [facility] will continue to follow.  Darl Pikes, PharmD, BCPS, BCOP, CPP Hematology/Oncology Clinical Pharmacist [facility] [phone]  07/20/2019 3:13 PM

## 2019-07-20 NOTE — Progress Notes (Signed)
PROGRESS NOTE    Sarah Chandler  FOY:[id number] DOB: 1985-04-29 DOA: 07/14/2019 PCP: Mellody Dance, DO   Brief Narrative:  Sarah Chandler is a 34 y.o. female with medical history significant of lung adenocarcinoma, recently discharged for community-acquired pneumonia who is coming to the emergency department via EMS with progressively worse dyspnea.  She has had chills and night sweats.  She denies she was admitted for a week from 07/01/2019 until 07/08/2019 due to multifocal pneumonia and was discharged home on oxygen.  She continues to have productive cough, fatigue and malaise.  She denies fever or hemoptysis.  She denies headache, sore throat, rhinorrhea, chest pain, palpitations, dizziness, diaphoresis, PND, pitting edema of the lower extremities, abdominal pain, nausea, vomiting, diarrhea, constipation, melena or hematochezia.  No dysuria, frequency.  No polyuria, polydipsia, polyphagia or blurred vision.  Sarah Chandler admitted via the ED on 07/14/2019 for acute hypoxic respiratory failure in the setting of bilateral diffuse airspace disease concerning for acutely worsening adenocarcinoma versus possible underlying infection.   Assessment & Plan:   Principal Problem:   HCAP (healthcare-associated pneumonia) Active Problems:   Depression   Hyperthyroidism   GAD (generalized anxiety disorder)   Primary lung adenocarcinoma (HCC)   Normocytic anemia   Acute on subacute hypoxic respiratory failure, poa Concern for worsening adenocarcinoma of the lung Unlikely infectious process SpO2: 93 % O2 Flow Rate (L/min): 6 L/min FiO2 (%): 40 % Sarah Chandler markedly improved after single dose of 125 mg methylprednisolone, continue 40 every 6h at this point until we can initiate treatment with oncology, pending adenocarcinoma genetic markers Likely worsening adenocarcinoma as below and not infection Sarah Chandler to start Alecensa (alectinib) per heme-onc Sarah Chandler initially on broad-spectrum antibiotics, with  ongoing leukemoid reaction in the setting of steroids but remains afebrile -discontinue antibiotics 07/18/2019 PCCM/pulmonology previously following, appreciate insight and recommendations about Sarah Chandler's imaging and guidance on what appears to be worsening airspace disease in the setting of lung cancer and not truly an infectious process Vancomycin/Cefepime at intake -discontinued 07/18/2019 Strep pneumoniae urinary antigen negative Procalcitonin < 0.10 favoring adenocarcinoma/inflammatory process over infection  Primary lung adenocarcinoma ([place]) She was scheduled for PET scan 07/14/19 Dr. Mohammed/oncology team following along -to begin Alecensa (alectinib) per their expertise  Depression Generalized anxiety disorder Continue home medications including hydroxyzine Continue as needed benzodiazepines given Sarah Chandler's worsening anxiety in the setting of above Sarah Chandler reports generally improving anxiety on both medications  Hyperthyroidism  Continue home medications including Tapazole, and propranolol.  Normocytic anemia Iron deficient on previous admission anemia panel. Monitor hematocrit and hemoglobin.  Thrombocytosis Likely reactive given above, follow with morning labs   DVT prophylaxis: Lovenox SQ Code Status: Full code. Family Communication: Disposition Plan:  Remains as inpatient requiring supplemental oxygen well above baseline, to initiate chemotherapy 07/20/2019 per heme-onc once clinical/respiratory status improves will discharge home Consults called: PCCM, oncology Admission status: Inpatient/stepdown  Procedures:   None  Antimicrobials:  Cefepime 07/14/19--> discontinued 07/18/2019 Vancomycin initiated 07/14/2019 --> discontinued 07/15/2019  Subjective: No acute issues or events overnight, Sarah Chandler already seen heme-onc to initiate therapy as above.  Denies chest pain, nausea, vomiting, diarrhea, constipation, headache, fevers, chills.  Objective: Vitals:   07/19/19  2000 07/19/19 2036 07/20/19 0000 07/20/19 0400  BP:  128/68 136/65   Pulse: 81  67   Resp: 18  (!) 23   Temp: 98.1 F (36.7 C)  97.9 F (36.6 C) (!) 97.5 F (36.4 C)  TempSrc: Oral  Axillary Oral  SpO2: 95% 98% 93%   Weight:  Height:        Intake/Output Summary (Last 24 hours) at 07/20/2019 0703 Last data filed at 07/19/2019 1700 Gross per 24 hour  Intake 15 ml  Output 1025 ml  Net -1010 ml   Filed Weights   07/14/19 0851  Weight: 86.6 kg    Examination:  General:  Pleasantly resting in bed, No acute distress. HEENT:  Normocephalic atraumatic.  Sclerae nonicteric, noninjected.  Extraocular movements intact bilaterally. Neck:  Without mass or deformity.  Trachea is midline. Lungs: Diffuse coarse breath sounds bilaterally, diminished breath sounds bibasilarly with end expiratory rhonchi biapically. Heart:  Regular rate and rhythm.  Without murmurs, rubs, or gallops. Abdomen:  Soft, nontender, nondistended.  Without guarding or rebound. Extremities: Without cyanosis, clubbing, edema, or obvious deformity. Vascular:  Dorsalis pedis and posterior tibial pulses palpable bilaterally. Skin:  Warm and dry, no erythema, no ulcerations.  Data Reviewed: I have personally reviewed following labs and imaging studies  CBC: Recent Labs  Lab 07/14/19 0915 07/15/19 0249 07/16/19 0313 07/17/19 0309 07/20/19 0305  WBC 18.2* 10.6* 21.8* 13.7* 20.3*  NEUTROABS 14.3* 8.7* 17.1* 10.1*  --   HGB 11.5* 10.7* 12.1 11.1* 12.6  HCT 37.2 35.4* 38.3 35.9* 41.1  MCV 87.5 88.9 86.1 88.0 89.3  PLT 620* 561* 639* 578* 818*   Basic Metabolic Panel: Recent Labs  Lab 07/14/19 0915 07/14/19 1126 07/15/19 0249 07/16/19 0313 07/17/19 0309 07/20/19 0305  NA 136  --  139 140 137 138  K 4.3  --  4.0 4.4 3.9 4.6  CL 103  --  108 108 106 104  CO2 22  --  21* 23 21* 23  GLUCOSE 107*  --  130* 109* 91 121*  BUN 7  --  8 10 9 14   CREATININE 0.57  --  0.39* 0.51 0.45 0.38*  CALCIUM 8.8*  --   8.9 8.7* 8.4* 8.8*  MG  --  1.9  --   --   --   --   PHOS  --  4.2  --   --   --   --    GFR: Estimated Creatinine Clearance: 120.6 mL/min (A) (by C-G formula based on SCr of 0.38 mg/dL (L)). Liver Function Tests: Recent Labs  Lab 07/14/19 0915 07/20/19 0305  AST 27 50*  ALT 26 77*  ALKPHOS 89 81  BILITOT 0.6 0.5  PROT 7.6 7.3  ALBUMIN 2.7* 2.8*   No results for input(s): LIPASE, AMYLASE in the last 168 hours. No results for input(s): AMMONIA in the last 168 hours. Coagulation Profile: No results for input(s): INR, PROTIME in the last 168 hours. Cardiac Enzymes: No results for input(s): CKTOTAL, CKMB, CKMBINDEX, TROPONINI in the last 168 hours. BNP (last 3 results) No results for input(s): PROBNP in the last 8760 hours. HbA1C: No results for input(s): HGBA1C in the last 72 hours. CBG: No results for input(s): GLUCAP in the last 168 hours. Lipid Profile: No results for input(s): CHOL, HDL, LDLCALC, TRIG, CHOLHDL, LDLDIRECT in the last 72 hours. Thyroid Function Tests: No results for input(s): TSH, T4TOTAL, FREET4, T3FREE, THYROIDAB in the last 72 hours. Anemia Panel: No results for input(s): VITAMINB12, FOLATE, FERRITIN, TIBC, IRON, RETICCTPCT in the last 72 hours. Sepsis Labs: Recent Labs  Lab 07/14/19 1126  PROCALCITON <0.10    Recent Results (from the past 240 hour(s))  Blood culture (routine x 2)     Status: None   Collection Time: 07/14/19  9:14 AM   Specimen: BLOOD  Result  Value Ref Range Status   Specimen Description   Final    BLOOD RIGHT ANTECUBITAL Performed at [facility] [address] [place], [place] [address]    Special Requests   Final    BOTTLES DRAWN AEROBIC AND ANAEROBIC Blood Culture adequate volume Performed at [facility] [address] [place], [place] [address]    Culture   Final    NO GROWTH 5 DAYS Performed at [facility], [place] [address] [place], [place] [address]    Report Status  07/19/2019 FINAL  Final  Blood culture (routine x 2)     Status: None   Collection Time: 07/14/19 10:15 AM   Specimen: BLOOD  Result Value Ref Range Status   Specimen Description   Final    BLOOD LEFT ANTECUBITAL Performed at [facility] [address], [place], [place] [address]    Special Requests   Final    BOTTLES DRAWN AEROBIC AND ANAEROBIC Blood Culture results may not be optimal due to an excessive volume of blood received in culture bottles Performed at [facility] [address] [place], [place] [address]    Culture   Final    NO GROWTH 5 DAYS Performed at [facility], [place] [address], [place], [place] [address]    Report Status 07/19/2019 FINAL  Final  SARS CORONAVIRUS 2 (TAT 6-24 HRS) Nasopharyngeal Nasopharyngeal Swab     Status: None   Collection Time: 07/14/19  2:45 PM   Specimen: Nasopharyngeal Swab  Result Value Ref Range Status   SARS Coronavirus 2 NEGATIVE NEGATIVE Final    Comment: (NOTE) SARS-CoV-2 target nucleic acids are NOT DETECTED. The SARS-CoV-2 RNA is generally detectable in upper and lower respiratory specimens during the acute phase of infection. Negative results do not preclude SARS-CoV-2 infection, do not rule out co-infections with other pathogens, and should not be used as the sole basis for treatment or other Sarah Chandler management decisions. Negative results must be combined with clinical observations, Sarah Chandler history, and epidemiological information. The expected result is Negative. Fact Sheet for Patients: SugarRoll.be Fact Sheet for Healthcare Providers: https://www.woods-mathews.com/ This test is not yet approved or cleared by the [place] FDA and  has been authorized for detection and/or diagnosis of SARS-CoV-2 by FDA under an Emergency Use Authorization (EUA). This EUA will remain  in effect (meaning this test can be used) for the duration of  the COVID-19 declaration under Section 56 4(b)(1) of the Act, 21 U.S.C. section 360bbb-3(b)(1), unless the authorization is terminated or revoked sooner. Performed at [facility], [place] [address], [place], [place] [address]   MRSA PCR Screening     Status: None   Collection Time: 07/14/19  8:59 PM   Specimen: Nasal Mucosa; Nasopharyngeal  Result Value Ref Range Status   MRSA by PCR NEGATIVE NEGATIVE Final    Comment:        The GeneXpert MRSA Assay (FDA approved for NASAL specimens only), is one component of a comprehensive MRSA colonization surveillance program. It is not intended to diagnose MRSA infection nor to guide or monitor treatment for MRSA infections. Performed at [facility], [place] [address] [place], [place] [address]     Radiology Studies: No results found.  Scheduled Meds: . Chlorhexidine Gluconate Cloth  6 each Topical Daily  . enoxaparin (LOVENOX) injection  40 mg Subcutaneous Q24H  . guaiFENesin  600 mg Oral BID  . loratadine  10  mg Oral Daily  . mouth rinse  15 mL Mouth Rinse BID  . methimazole  5 mg Oral TID  . methylPREDNISolone (SOLU-MEDROL) injection  40 mg Intravenous Q6H  . propranolol  40 mg Oral TID   Continuous Infusions: . sodium chloride Stopped (07/19/19 1000)     LOS: 6 days   Time spent: 94min  Jonathandavid Marlett C Krishay Faro, DO [facility]  If 7PM-7AM, please contact night-coverage [email or web address]  07/20/2019, 7:03 AM

## 2019-07-20 NOTE — Progress Notes (Signed)
Subjective: The patient is seen and examined today.  She is feeling a little bit better today with less oxygen requirement.  She denied having any current chest pain but continues to have cough with no hemoptysis.  She denied having any fever or chills.  She has no nausea, vomiting, diarrhea or constipation.  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  Objective: Vital signs in last 24 hours: Temp:  [97.5 F (36.4 C)-98.1 F (36.7 C)] 97.5 F (36.4 C) (04/12 0400) Pulse Rate:  [66-85] 67 (04/12 0000) Resp:  [18-32] 23 (04/12 0000) BP: (117-136)/(65-87) 136/65 (04/12 0000) SpO2:  [92 %-99 %] 93 % (04/12 0000) FiO2 (%):  [40 %] 40 % (04/11 1214)  Intake/Output from previous day: 04/11 0701 - 04/12 0700 In: 15 [I.V.:15] Out: 1025 [Urine:1025] Intake/Output this shift: No intake/output data recorded.  General appearance: alert, cooperative, fatigued and no distress Resp: rales bilaterally Cardio: regular rate and rhythm, S1, S2 normal, no murmur, click, rub or gallop GI: soft, non-tender; bowel sounds normal; no masses,  no organomegaly Extremities: extremities normal, atraumatic, no cyanosis or edema  Lab Results:  Recent Labs    07/20/19 0305  WBC 20.3*  HGB 12.6  HCT 41.1  PLT 682*   BMET Recent Labs    07/20/19 0305  NA 138  K 4.6  CL 104  CO2 23  GLUCOSE 121*  BUN 14  CREATININE 0.38*  CALCIUM 8.8*    Studies/Results: No results found.  Medications: I have reviewed the patient's current medications.   Assessment/Plan: This is a very pleasant 34 years old white female recently diagnosed with stage IV non-small cell lung cancer, adenocarcinoma presented with bilateral pulmonary nodules and masses as well as metastatic brain lesions. 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 I had a lengthy discussion with the patient today about her current condition and treatment options. I recommended for the patient treatment with target therapy with Alecensa 600 mg p.o. twice daily. I would work for the pharmacist for oncolytic oral medications to start the process to be able to obtain this medication for the patient.  It is an expensive medication and this would require some assistance from the manufacturer.  I discussed with the patient the adverse effect of this treatment and she is agreeable to proceed with the treatment as planned. For the suspicious pneumonia, continue with the current treatment as planned by the hospitalist. Thank you for taking good care of Ms. Martha, I will continue to follow up the patient with you and assist in her management.   LOS: 6 days    Eilleen Kempf 07/20/2019

## 2019-07-21 ENCOUNTER — Encounter: Payer: Self-pay | Admitting: *Deleted

## 2019-07-21 LAB — CBC
HCT: 38.4 % (ref 36.0–46.0)
Hemoglobin: 11.7 g/dL — ABNORMAL LOW (ref 12.0–15.0)
MCH: 27 pg (ref 26.0–34.0)
MCHC: 30.5 g/dL (ref 30.0–36.0)
MCV: 88.5 fL (ref 80.0–100.0)
Platelets: 654 10*3/uL — ABNORMAL HIGH (ref 150–400)
RBC: 4.34 MIL/uL (ref 3.87–5.11)
RDW: 15.8 % — ABNORMAL HIGH (ref 11.5–15.5)
WBC: 17.5 10*3/uL — ABNORMAL HIGH (ref 4.0–10.5)
nRBC: 0 % (ref 0.0–0.2)

## 2019-07-21 LAB — COMPREHENSIVE METABOLIC PANEL
ALT: 84 U/L — ABNORMAL HIGH (ref 0–44)
AST: 42 U/L — ABNORMAL HIGH (ref 15–41)
Albumin: 2.5 g/dL — ABNORMAL LOW (ref 3.5–5.0)
Alkaline Phosphatase: 72 U/L (ref 38–126)
Anion gap: 8 (ref 5–15)
BUN: 11 mg/dL (ref 6–20)
CO2: 27 mmol/L (ref 22–32)
Calcium: 8.6 mg/dL — ABNORMAL LOW (ref 8.9–10.3)
Chloride: 102 mmol/L (ref 98–111)
Creatinine, Ser: 0.43 mg/dL — ABNORMAL LOW (ref 0.44–1.00)
GFR calc Af Amer: >60 mL/min (ref >60)
GFR calc non Af Amer: >60 mL/min (ref >60)
Glucose, Bld: 120 mg/dL — ABNORMAL HIGH (ref 70–99)
Potassium: 4.3 mmol/L (ref 3.5–5.1)
Sodium: 137 mmol/L (ref 135–145)
Total Bilirubin: 0.6 mg/dL (ref 0.3–1.2)
Total Protein: 6.2 g/dL — ABNORMAL LOW (ref 6.5–8.1)

## 2019-07-21 MED ORDER — PREDNISONE 10 MG PO TABS: ORAL_TABLET | ORAL | 0 refills | Status: AC

## 2019-07-21 NOTE — Progress Notes (Signed)
Brief oncology note:  Spoke with hospitalist who indicates that he will likely discharge Ms. Sargeant later today.  I stopped by to visit with Ms. Cogar and let her know that our pharmacy is working on obtaining her Eyvonne Left and are hoping to have samples for her tomorrow.  They will contact her as soon as the samples arrive.  We are also arranging for follow-up at the cancer center next week for close monitoring of adverse effects of her treatment.  She will be called with the date and time of her appointment.  Mikey Bussing, DNP, AGPCNP-BC, AOCNP Mon/Tues/Thurs/Fri 7am-5pm; Off Wednesdays Cell: [phone]

## 2019-07-21 NOTE — Discharge Summary (Signed)
Physician Discharge Summary  Sarah Chandler:[id number] DOB: 1985-12-25 DOA: 07/14/2019  PCP: Mellody Dance, DO  Admit date: 07/14/2019 Discharge date: 07/21/2019  Admitted From: Home Disposition: Home  Recommendations for Outpatient Follow-up:  1. Follow up with PCP in 1-2 weeks 2. Please obtain BMP/CBC in one week   Equipment/Devices: Oxygen - 2L [place] with exertion  Discharge Condition: Stable CODE STATUS: Full Diet recommendation: As tolerated  Brief/Interim Summary: Sarah Chandler a 34 y.o.femalewith medical history significant oflung adenocarcinoma, recently discharged for community-acquired pneumonia who is coming to the emergency departmentvia EMSwith progressively worse dyspnea.She has had chills and night sweats. She denies she was admitted for a week from 07/01/2019 until 07/08/2019 due to multifocal pneumonia and was discharged home on oxygen. She continues to have productive cough, fatigue and malaise. She denies fever or hemoptysis. She denies headache, sore throat, rhinorrhea, chest pain, palpitations, dizziness, diaphoresis, PND, pitting edema of the lower extremities, abdominal pain, nausea, vomiting, diarrhea, constipation, melena or hematochezia. No dysuria, frequency. No polyuria, polydipsia, polyphagia or blurred vision.  Patient admitted via the ED on 07/14/2019 for acute hypoxic respiratory failure in the setting of bilateral diffuse airspace disease concerning for acutely worsening adenocarcinoma versus possible underlying infection  Patient admitted with acute hypoxic respiratory failure concerning for worsening community-acquired versus HCAP pneumonia after further discussion with ICU and review of images it appears patient has acute inflammatory response in the setting of worsening adenocarcinoma.  Patient was treated with antibiotics prophylactically to ensure no underlying infection but had very little improvement, patient was initiated on high-dose  steroids after discussion with pulmonology and oncology.  Over the past 72 hours patient's respiratory status has continued to improve quite drastically, so much so that she is now able to ambulate on 2 L nasal cannula without overt hypoxia or symptoms.  Patient following closely with oncology to initiate new medication (Alectinib) for adenocarcinoma.  She has close follow-up with oncology later this week scheduled to initiate this medication at their facility.  Patient otherwise feels quite well, stable and agreeable for discharge.  Discharge Diagnoses:  Principal Problem:   HCAP (healthcare-associated pneumonia) Active Problems:   Depression   Hyperthyroidism   GAD (generalized anxiety disorder)   Primary lung adenocarcinoma (HCC)   Normocytic anemia   Discharge Instructions  Discharge Instructions    Call MD for:  difficulty breathing, headache or visual disturbances   Complete by: As directed    Call MD for:  extreme fatigue   Complete by: As directed    Call MD for:  hives   Complete by: As directed    Call MD for:  persistant dizziness or light-headedness   Complete by: As directed    Call MD for:  persistant nausea and vomiting   Complete by: As directed    Call MD for:  severe uncontrolled pain   Complete by: As directed    Call MD for:  temperature >100.4   Complete by: As directed    Diet - low sodium heart healthy   Complete by: As directed    Increase activity slowly   Complete by: As directed      Allergies as of 07/21/2019      Reactions   Iron Other (See Comments)   Ferrosol - Liquid  Unknown reaction       Medication List    TAKE these medications   acetaminophen 500 MG tablet Commonly known as: TYLENOL Take 1,000 mg by mouth every 6 (six) hours as needed  for moderate pain or headache.   albuterol 108 (90 Base) MCG/ACT inhaler Commonly known as: VENTOLIN HFA Inhale 2 puffs into the lungs every 6 (six) hours as needed for wheezing or shortness of  breath.   alectinib 150 MG capsule Commonly known as: ALECENSA Take 600 mg by mouth 2 (two) times daily with a meal.   guaifenesin 100 MG/5ML syrup Commonly known as: ROBITUSSIN Take 200 mg by mouth 3 (three) times daily as needed for cough.   hydrOXYzine 25 MG tablet Commonly known as: ATARAX/VISTARIL Take 1 tablet (25 mg total) by mouth 3 (three) times daily as needed for anxiety.   methimazole 5 MG tablet Commonly known as: TAPAZOLE Take 1 tablet (5 mg total) by mouth 3 (three) times daily. Notes to patient: Next Dose Due 07/21/2019 pm   multivitamin with minerals Tabs tablet Take 2 tablets by mouth daily. Gummies Notes to patient: Next Dose Due 07/22/2019   OVER THE COUNTER MEDICATION Take 2 each by mouth daily as needed (anxiety). CBD gummies   predniSONE 10 MG tablet Commonly known as: DELTASONE Take 4 tablets (40 mg total) by mouth daily for 3 days, THEN 3 tablets (30 mg total) daily for 3 days, THEN 2 tablets (20 mg total) daily for 3 days, THEN 1 tablet (10 mg total) daily for 3 days. Start taking on: July 21, 2019 Notes to patient: Net Dose Due 07/22/2019   propranolol 40 MG tablet Commonly known as: INDERAL Take 1 tablet (40 mg total) by mouth 3 (three) times daily.            Durable Medical Equipment  (From admission, onward)         Start     Ordered   07/21/19 1639  DME Oxygen  Once    Question Answer Comment  Length of Need 6 Months   Mode or (Route) Nasal cannula   Liters per Minute 2   Frequency Continuous (stationary and portable oxygen unit needed)   Oxygen conserving device No   Oxygen delivery system Gas      07/21/19 1639          Allergies  Allergen Reactions  . Iron Other (See Comments)    Ferrosol - Liquid  Unknown reaction     Consultations:  Oncology Dr. Earlie Server  Pulmonology Dr. Lake Bells   Procedures/Studies: CT Angio Chest PE W/Cm &/Or Wo Cm  Result Date: 07/14/2019 CLINICAL DATA:  Shortness of breath, recent  diagnosis of lung cancer, COVID negative EXAM: CT ANGIOGRAPHY CHEST WITH CONTRAST TECHNIQUE: Multidetector CT imaging of the chest was performed using the standard protocol during bolus administration of intravenous contrast. Multiplanar CT image reconstructions and MIPs were obtained to evaluate the vascular anatomy. CONTRAST:  181mL OMNIPAQUE IOHEXOL 350 MG/ML SOLN COMPARISON:  Radiograph same day, CT chest July 01, 2019 FINDINGS: Cardiovascular: There is a optimal opacification of the pulmonary arteries. There is no central,segmental, or subsegmental filling defects within the pulmonary arteries. The heart is normal in size. A trace pericardial effusion/thickening is seen. No evidence right heart strain. There is normal three-vessel brachiocephalic anatomy without proximal stenosis. The thoracic aorta is normal in appearance. Mediastinum/Nodes: Slight interval progression in the scattered mediastinal and bilateral axillary adenopathy. For example within the left axilla there is a 1.2 cm lymph node, series 4, image 32 and previously 1 cm. There is also 1.0 cm subcarinal lymph node which was previously 8 mm. Lungs/Pleura: There is interval progression in the multifocal extensive airspace patchy opacities throughout both  lungs. There are areas of dense consolidation seen within the left lung base. Small bilateral pleural effusions are present, right greater than left. Upper Abdomen: Again noted are scattered left-sided para aortic lymph nodes. Musculoskeletal: Again noted are numerous tiny sclerotic foci within the visualized axial and appendicular skeleton. No destructive osseous lesion or fracture is identified. Review of the MIP images confirms the above findings. IMPRESSION: 1. No central, segmental, or subsegmental pulmonary embolism. 2. Interval progression in extensive multifocal airspace opacities and consolidation, likely multifocal pneumonia however cannot exclude interval progression in the patient's  known primary lung neoplasm with metastatic disease. 3. Small bilateral pleural effusions, left greater than right 4. Mediastinal and bilateral axillary adenopathy, with slight interval progression Electronically Signed   By: Prudencio Pair M.D.   On: 07/14/2019 11:40   CT Angio Chest PE W/Cm &/Or Wo Cm  Result Date: 07/01/2019 CLINICAL DATA:  Shortness of breath, cough, recent diagnosis of pneumonia EXAM: CT ANGIOGRAPHY CHEST WITH CONTRAST TECHNIQUE: Multidetector CT imaging of the chest was performed using the standard protocol during bolus administration of intravenous contrast. Multiplanar CT image reconstructions and MIPs were obtained to evaluate the vascular anatomy. CONTRAST:  9mL OMNIPAQUE IOHEXOL 350 MG/ML SOLN COMPARISON:  05/08/2019, 07/01/2019 FINDINGS: Cardiovascular: This is a technically adequate evaluation of the pulmonary vasculature. There are no filling defects or pulmonary emboli. The heart is unremarkable without pericardial effusion. Thoracic aorta is normal in caliber with no evidence of dissection. Mediastinum/Nodes: There is bilateral axillary adenopathy. Largest lymph node in the right axilla measures 27 x 14 mm. Multiple subcentimeter lymph nodes within the mediastinum are stable. The thyroid, esophagus, and trachea are unremarkable. Lungs/Pleura: Since the prior exam, there has been marked progression of the bilateral nodular airspace disease. Marked increase in dense consolidation at the lung bases bilaterally. There is persistent diffuse interlobular septal thickening. Small bilateral pleural effusions have developed in the interim, right greater than left. No pneumothorax. The central airways are patent. Upper Abdomen: Multiple subcentimeter lymph nodes are seen in the gastrohepatic ligament, largest measuring 9 mm reference image 116. No other acute abnormalities. Musculoskeletal: Innumerable punctate sclerotic foci are seen throughout the thoracic spine and sternum, nonspecific.  There are no acute or destructive bony lesions. Review of the MIP images confirms the above findings. IMPRESSION: 1. Progression of the multifocal bilateral airspace disease seen previously. The persistence an appearance would favor atypical bacterial or fungal pneumonia. Correlation with bronchoscopy and sputum analysis may be useful. 2. Small bilateral pleural effusions, right greater than left. 3. Lymphadenopathy within the bilateral axillary regions, mediastinum, and upper abdomen, nonspecific. No significant change since prior study. 4. No evidence of pulmonary embolus. Electronically Signed   By: Randa Ngo M.D.   On: 07/01/2019 22:32   MR BRAIN W WO CONTRAST  Result Date: 07/08/2019 CLINICAL DATA:  Non-small-cell lung cancer staging EXAM: MRI HEAD WITHOUT AND WITH CONTRAST TECHNIQUE: Multiplanar, multiecho pulse sequences of the brain and surrounding structures were obtained without and with intravenous contrast. CONTRAST:  54mL GADAVIST GADOBUTROL 1 MMOL/ML IV SOLN COMPARISON:  None. FINDINGS: Brain: Ventricle size normal. Negative for acute infarct. Negative for hemorrhage. Small enhancing lesions are present in the brain compatible with metastatic disease. 2 mm enhancing lesion right parietal operculum axial image 32 2 x 4 mm enhancing lesion right occipital parietal lobe axial image 32 3 mm enhancing mass left posterior frontal cortex which appears anterior to the motor cortex axial image 43 2 mm enhancing lesion in the left frontal parietal lobe  which may be within the motor cortex axial image 39. No significant brain edema.  No midline shift. Vascular: Normal arterial flow voids Skull and upper cervical spine: No focal skeletal lesion. 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 No significant edema or hemorrhage. Electronically Signed   By: Franchot Gallo M.D.   On: 07/08/2019 11:15   DG Chest  Port 1 View  Result Date: 07/14/2019 CLINICAL DATA:  Shortness of breath.  Lung carcinoma EXAM: PORTABLE CHEST 1 VIEW COMPARISON:  Chest CT July 03, 2019 and CT angiogram chest July 01, 2019 FINDINGS: There is widespread airspace opacity throughout the lungs bilaterally with slight progression in consolidation in the lower lung regions compared to most recent study. There is airspace opacity adjacent to the left superior hilum, stable. There are fairly small pleural effusions as well. The heart size is normal. Pulmonary vascularity is grossly normal. No adenopathy evident by radiography. No bone lesions appreciable by radiography. IMPRESSION: Widespread airspace opacity consistent with multifocal pneumonia. Pleural effusions present. It should be noted that an underlying parenchymal neoplastic involvement in the lungs cannot be excluded, particularly given findings on recent CT. Stable cardiac silhouette. No adenopathy demonstrable by radiography; see prior chest CT report, however. Electronically Signed   By: Lowella Grip III M.D.   On: 07/14/2019 09:28   DG CHEST PORT 1 VIEW  Result Date: 07/03/2019 CLINICAL DATA:  Status post bronchoscopy for respiratory failure. EXAM: PORTABLE CHEST 1 VIEW COMPARISON:  July 01 2019 FINDINGS: The mediastinal contour and cardiac silhouette are stable. Bibasilar infiltrates are noted without interval change. Left suprahilar opacity is identified unchanged. There is no pleural line to suggest pneumothorax. The bony structures are stable. IMPRESSION: 1. No pneumothorax. 2. Bibasilar infiltrates unchanged. Left suprahilar opacity unchanged. Electronically Signed   By: Abelardo Diesel M.D.   On: 07/03/2019 15:18   DG Chest Portable 1 View  Result Date: 07/01/2019 CLINICAL DATA:  Shortness of breath. EXAM: PORTABLE CHEST 1 VIEW COMPARISON:  May 07, 2019 FINDINGS: Worsening bibasilar pulmonary infiltrates. Probable small associated effusions. The heart, hila, and  mediastinum are unchanged. No other interval changes. IMPRESSION: Worsening bibasilar infiltrates with small associated effusions. This may represent pneumonia or aspiration. It would be unusual for pneumonia two persist since May 07, 2019 however. Recurrent pneumonia or repeated aspiration are possibilities. Recommend clinical correlation and short-term follow-up imaging to ensure resolution. Electronically Signed   By: Dorise Bullion III M.D   On: 07/01/2019 21:14   DG C-ARM BRONCHOSCOPY  Result Date: 07/03/2019 C-ARM BRONCHOSCOPY: Fluoroscopy was utilized by the requesting physician.  No radiographic interpretation.    Subjective: No acute issues or events overnight, patient's respiratory status continues to improve, requesting discharge home.  Declines chest pain, nausea, vomiting, diarrhea, constipation, headache, fevers, chills.   Discharge Exam: Vitals:   07/21/19 1600 07/21/19 1605  BP:  125/88  Pulse: 94 (!) 111  Resp: 16 (!) 26  Temp:    SpO2: 92% 93%   Vitals:   07/21/19 1250 07/21/19 1557 07/21/19 1600 07/21/19 1605  BP:    125/88  Pulse: 88 99 94 (!) 111  Resp: (!) 23 (!) 23 16 (!) 26  Temp:      TempSrc:      SpO2: (!) 88% 91% 92% 93%  Weight:      Height:        General:  Pleasantly resting in bed, No acute distress. HEENT:  Normocephalic atraumatic.  Sclerae nonicteric, noninjected.  Extraocular movements intact bilaterally. Neck:  Without mass or deformity.  Trachea is midline. Lungs: Coarse breath sounds bilaterally without overt wheeze or rales Heart:  Regular rate and rhythm.  Without murmurs, rubs, or gallops. Abdomen:  Soft, nontender, nondistended.  Without guarding or rebound. Extremities: Without cyanosis, clubbing, edema, or obvious deformity. Vascular:  Dorsalis pedis and posterior tibial pulses palpable bilaterally. Skin:  Warm and dry, no erythema, no ulcerations.   The results of significant diagnostics from this hospitalization (including  imaging, microbiology, ancillary and laboratory) are listed below for reference.     Microbiology: Recent Results (from the past 240 hour(s))  Blood culture (routine x 2)     Status: None   Collection Time: 07/14/19  9:14 AM   Specimen: BLOOD  Result Value Ref Range Status   Specimen Description   Final    BLOOD RIGHT ANTECUBITAL Performed at [facility] [address] [place], [place] [address]    Special Requests   Final    BOTTLES DRAWN AEROBIC AND ANAEROBIC Blood Culture adequate volume Performed at [facility] [address] [place], [place] [address]    Culture   Final    NO GROWTH 5 DAYS Performed at [facility], [place] [address] [place], [place] [address]    Report Status 07/19/2019 FINAL  Final  Blood culture (routine x 2)     Status: None   Collection Time: 07/14/19 10:15 AM   Specimen: BLOOD  Result Value Ref Range Status   Specimen Description   Final    BLOOD LEFT ANTECUBITAL Performed at [facility] [address] [place], [place] [address]    Special Requests   Final    BOTTLES DRAWN AEROBIC AND ANAEROBIC Blood Culture results may not be optimal due to an excessive volume of blood received in culture bottles Performed at [facility] [address], [place], [place] [address]    Culture   Final    NO GROWTH 5 DAYS Performed at [facility], [place] [address], [place], [place] [address]    Report Status 07/19/2019 FINAL  Final  SARS CORONAVIRUS 2 (TAT 6-24 HRS) Nasopharyngeal Nasopharyngeal Swab     Status: None   Collection Time: 07/14/19  2:45 PM   Specimen: Nasopharyngeal Swab  Result Value Ref Range Status   SARS Coronavirus 2 NEGATIVE NEGATIVE Final    Comment: (NOTE) SARS-CoV-2 target nucleic acids are NOT DETECTED. The SARS-CoV-2 RNA is generally detectable in upper and lower respiratory specimens during the acute phase of infection. Negative results do  not preclude SARS-CoV-2 infection, do not rule out co-infections with other pathogens, and should not be used as the sole basis for treatment or other patient management decisions. Negative results must be combined with clinical observations, patient history, and epidemiological information. The expected result is Negative. Fact Sheet for Patients: SugarRoll.be Fact Sheet for Healthcare Providers: https://www.woods-mathews.com/ This test is not yet approved or cleared by the [place] FDA and  has been authorized for detection and/or diagnosis of SARS-CoV-2 by FDA under an Emergency Use Authorization (EUA). This EUA will remain  in effect (meaning this test can be used) for the duration of the COVID-19 declaration under Section 56 4(b)(1) of the Act, 21 U.S.C. section 360bbb-3(b)(1), unless the authorization is terminated or revoked sooner. Performed at [facility], [place] [address], [place],  [address]   MRSA PCR Screening  Status: None   Collection Time: 07/14/19  8:59 PM   Specimen: Nasal Mucosa; Nasopharyngeal  Result Value Ref Range Status   MRSA by PCR NEGATIVE NEGATIVE Final    Comment:        The GeneXpert MRSA Assay (FDA approved for NASAL specimens only), is one component of a comprehensive MRSA colonization surveillance program. It is not intended to diagnose MRSA infection nor to guide or monitor treatment for MRSA infections. Performed at [facility], [place] [address] [place], [place] [address]      Labs: BNP (last 3 results) Recent Labs    07/01/19 2200 07/07/19 1319 07/14/19 0915  BNP 20.0 13.8 93.7   Basic Metabolic Panel: Recent Labs  Lab 07/15/19 0249 07/16/19 0313 07/17/19 0309 07/20/19 0305 07/21/19 0229  NA 139 140 137 138 137  K 4.0 4.4 3.9 4.6 4.3  CL 108 108 106 104 102  CO2 21* 23 21* 23 27  GLUCOSE 130* 109* 91 121* 120*  BUN 8 10 9 14 11   CREATININE  0.39* 0.51 0.45 0.38* 0.43*  CALCIUM 8.9 8.7* 8.4* 8.8* 8.6*   Liver Function Tests: Recent Labs  Lab 07/20/19 0305 07/21/19 0229  AST 50* 42*  ALT 77* 84*  ALKPHOS 81 72  BILITOT 0.5 0.6  PROT 7.3 6.2*  ALBUMIN 2.8* 2.5*   No results for input(s): LIPASE, AMYLASE in the last 168 hours. No results for input(s): AMMONIA in the last 168 hours. CBC: Recent Labs  Lab 07/15/19 0249 07/16/19 0313 07/17/19 0309 07/20/19 0305 07/21/19 0229  WBC 10.6* 21.8* 13.7* 20.3* 17.5*  NEUTROABS 8.7* 17.1* 10.1*  --   --   HGB 10.7* 12.1 11.1* 12.6 11.7*  HCT 35.4* 38.3 35.9* 41.1 38.4  MCV 88.9 86.1 88.0 89.3 88.5  PLT 561* 639* 578* 682* 654*   Cardiac Enzymes: No results for input(s): CKTOTAL, CKMB, CKMBINDEX, TROPONINI in the last 168 hours. BNP: Invalid input(s): POCBNP CBG: No results for input(s): GLUCAP in the last 168 hours. D-Dimer No results for input(s): DDIMER in the last 72 hours. Hgb A1c No results for input(s): HGBA1C in the last 72 hours. Lipid Profile No results for input(s): CHOL, HDL, LDLCALC, TRIG, CHOLHDL, LDLDIRECT in the last 72 hours. Thyroid function studies No results for input(s): TSH, T4TOTAL, T3FREE, THYROIDAB in the last 72 hours.  Invalid input(s): FREET3 Anemia work up No results for input(s): VITAMINB12, FOLATE, FERRITIN, TIBC, IRON, RETICCTPCT in the last 72 hours. Urinalysis    Component Value Date/Time   COLORURINE STRAW (A) 07/01/2019 2324   APPEARANCEUR CLEAR 07/01/2019 2324   LABSPEC 1.017 07/01/2019 2324   PHURINE 6.0 07/01/2019 2324   GLUCOSEU NEGATIVE 07/01/2019 2324   HGBUR NEGATIVE 07/01/2019 2324   BILIRUBINUR NEGATIVE 07/01/2019 2324   KETONESUR NEGATIVE 07/01/2019 2324   PROTEINUR NEGATIVE 07/01/2019 2324   NITRITE NEGATIVE 07/01/2019 2324   LEUKOCYTESUR NEGATIVE 07/01/2019 2324   Sepsis Labs Invalid input(s): PROCALCITONIN,  WBC,  LACTICIDVEN Microbiology Recent Results (from the past 240 hour(s))  Blood culture  (routine x 2)     Status: None   Collection Time: 07/14/19  9:14 AM   Specimen: BLOOD  Result Value Ref Range Status   Specimen Description   Final    BLOOD RIGHT ANTECUBITAL Performed at [facility], [place] [address], [place], [place] [address]    Special Requests   Final    BOTTLES DRAWN AEROBIC AND ANAEROBIC Blood Culture adequate volume Performed at [facility],  [place] [address], [place], [place] [address]    Culture   Final    NO GROWTH 5 DAYS Performed at [facility], [place] [address], [place], [place] [address]    Report Status 07/19/2019 FINAL  Final  Blood culture (routine x 2)     Status: None   Collection Time: 07/14/19 10:15 AM   Specimen: BLOOD  Result Value Ref Range Status   Specimen Description   Final    BLOOD LEFT ANTECUBITAL Performed at [facility] [address], [place], [place] [address]    Special Requests   Final    BOTTLES DRAWN AEROBIC AND ANAEROBIC Blood Culture results may not be optimal due to an excessive volume of blood received in culture bottles Performed at [facility] [address], [place], [place] [address]    Culture   Final    NO GROWTH 5 DAYS Performed at [facility], [place] [address], [place], [place] [address]    Report Status 07/19/2019 FINAL  Final  SARS CORONAVIRUS 2 (TAT 6-24 HRS) Nasopharyngeal Nasopharyngeal Swab     Status: None   Collection Time: 07/14/19  2:45 PM   Specimen: Nasopharyngeal Swab  Result Value Ref Range Status   SARS Coronavirus 2 NEGATIVE NEGATIVE Final    Comment: (NOTE) SARS-CoV-2 target nucleic acids are NOT DETECTED. The SARS-CoV-2 RNA is generally detectable in upper and lower respiratory specimens during the acute phase of infection. Negative results do not preclude SARS-CoV-2 infection, do not rule out co-infections with other pathogens, and should not be used as the sole basis for treatment or other patient  management decisions. Negative results must be combined with clinical observations, patient history, and epidemiological information. The expected result is Negative. Fact Sheet for Patients: SugarRoll.be Fact Sheet for Healthcare Providers: https://www.woods-mathews.com/ This test is not yet approved or cleared by the [place] FDA and  has been authorized for detection and/or diagnosis of SARS-CoV-2 by FDA under an Emergency Use Authorization (EUA). This EUA will remain  in effect (meaning this test can be used) for the duration of the COVID-19 declaration under Section 56 4(b)(1) of the Act, 21 U.S.C. section 360bbb-3(b)(1), unless the authorization is terminated or revoked sooner. Performed at [facility], [place] [address], [place], [place] [address]   MRSA PCR Screening     Status: None   Collection Time: 07/14/19  8:59 PM   Specimen: Nasal Mucosa; Nasopharyngeal  Result Value Ref Range Status   MRSA by PCR NEGATIVE NEGATIVE Final    Comment:        The GeneXpert MRSA Assay (FDA approved for NASAL specimens only), is one component of a comprehensive MRSA colonization surveillance program. It is not intended to diagnose MRSA infection nor to guide or monitor treatment for MRSA infections. Performed at [facility], [place] [address] [place], [place] [address]      Time coordinating discharge: Over 30 minutes  SIGNED:   Little Ishikawa, DO [facility] 07/21/2019, 4:43 PM Pager   If 7PM-7AM, please contact night-coverage [email or web address]

## 2019-07-21 NOTE — Progress Notes (Deleted)
PROGRESS NOTE    JOURY ALLCORN  WNI:[id number] DOB: 1986-02-13 DOA: 07/14/2019 PCP: Mellody Dance, DO   Brief Narrative:  Sarah Chandler is a 34 y.o. female with medical history significant of lung adenocarcinoma, recently discharged for community-acquired pneumonia who is coming to the emergency department via EMS with progressively worse dyspnea.  She has had chills and night sweats.  She denies she was admitted for a week from 07/01/2019 until 07/08/2019 due to multifocal pneumonia and was discharged home on oxygen.  She continues to have productive cough, fatigue and malaise.  She denies fever or hemoptysis.  She denies headache, sore throat, rhinorrhea, chest pain, palpitations, dizziness, diaphoresis, PND, pitting edema of the lower extremities, abdominal pain, nausea, vomiting, diarrhea, constipation, melena or hematochezia.  No dysuria, frequency.  No polyuria, polydipsia, polyphagia or blurred vision.  Patient admitted via the ED on 07/14/2019 for acute hypoxic respiratory failure in the setting of bilateral diffuse airspace disease concerning for acutely worsening adenocarcinoma versus possible underlying infection.   Assessment & Plan:   Principal Problem:   HCAP (healthcare-associated pneumonia) Active Problems:   Depression   Hyperthyroidism   GAD (generalized anxiety disorder)   Primary lung adenocarcinoma (HCC)   Normocytic anemia   Acute on subacute hypoxic respiratory failure, poa Concern for worsening adenocarcinoma of the lung Unlikely infectious process SpO2: 90 % O2 Flow Rate (L/min): 4 L/min FiO2 (%): 40 % Patient markedly improved after single dose of 125 mg methylprednisolone, continue 40 every 6h at this point until we can initiate treatment with oncology, pending adenocarcinoma genetic markers Likely worsening adenocarcinoma as below and not infection Patient to start Alecensa (alectinib) per heme-onc - hopefully next 24 to 48 hours Patient initially on  broad-spectrum antibiotics, with ongoing leukemoid reaction in the setting of steroids but remains afebrile -discontinue antibiotics 07/18/2019 PCCM/pulmonology previously following, appreciate insight and recommendations about patient's imaging and guidance on what appears to be worsening airspace disease in the setting of lung cancer and not truly an infectious process Vancomycin/Cefepime at intake -discontinued 07/18/2019 Strep pneumoniae urinary antigen negative Procalcitonin < 0.10 favoring adenocarcinoma/inflammatory process over infection  Primary lung adenocarcinoma ([place]) She was scheduled for PET scan 07/14/19 Dr. Mohammed/oncology team following along -to begin Alecensa (alectinib) per their expertise  Depression Generalized anxiety disorder Continue home medications including hydroxyzine Continue as needed benzodiazepines given patient's worsening anxiety in the setting of above Patient reports generally improving anxiety on both medications  Hyperthyroidism  Continue home medications including Tapazole, and propranolol.  Normocytic anemia Iron deficient on previous admission anemia panel. Monitor hematocrit and hemoglobin.  Thrombocytosis Likely reactive given above, follow with morning labs   DVT prophylaxis: Lovenox SQ Code Status: Full code. Family Communication: Disposition Plan:  Remains as inpatient requiring supplemental oxygen well above baseline, to initiate chemotherapy 07/20/2019 per heme-onc once clinical/respiratory status improves will discharge home Consults called: PCCM, oncology Admission status: Inpatient/stepdown  Procedures:   None  Antimicrobials:  Cefepime 07/14/19--> discontinued 07/18/2019 Vancomycin initiated 07/14/2019 --> discontinued 07/15/2019  Subjective: No acute issues or events overnight, patient already seen heme-onc to initiate therapy as above.  Denies chest pain, nausea, vomiting, diarrhea, constipation, headache, fevers,  chills.  Objective: Vitals:   07/20/19 2338 07/21/19 0354 07/21/19 0400 07/21/19 0850  BP:  108/60  140/70  Pulse:  (!) 43  90  Resp:  19  (!) 28  Temp: (!) 97.5 F (36.4 C)  (!) 97.4 F (36.3 C) (!) 97.5 F (36.4 C)  TempSrc: Oral  Oral  Oral  SpO2:  100%  90%  Weight:      Height:        Intake/Output Summary (Last 24 hours) at 07/21/2019 1203 Last data filed at 07/21/2019 0400 Gross per 24 hour  Intake 0 ml  Output 0 ml  Net 0 ml   Filed Weights   07/14/19 0851  Weight: 86.6 kg    Examination:  General:  Pleasantly resting in bed, No acute distress. HEENT:  Normocephalic atraumatic.  Sclerae nonicteric, noninjected.  Extraocular movements intact bilaterally. Neck:  Without mass or deformity.  Trachea is midline. Lungs: Diffuse coarse breath sounds bilaterally, diminished breath sounds bibasilarly with end expiratory rhonchi biapically. Heart:  Regular rate and rhythm.  Without murmurs, rubs, or gallops. Abdomen:  Soft, nontender, nondistended.  Without guarding or rebound. Extremities: Without cyanosis, clubbing, edema, or obvious deformity. Vascular:  Dorsalis pedis and posterior tibial pulses palpable bilaterally. Skin:  Warm and dry, no erythema, no ulcerations.  Data Reviewed: I have personally reviewed following labs and imaging studies  CBC: Recent Labs  Lab 07/15/19 0249 07/16/19 0313 07/17/19 0309 07/20/19 0305 07/21/19 0229  WBC 10.6* 21.8* 13.7* 20.3* 17.5*  NEUTROABS 8.7* 17.1* 10.1*  --   --   HGB 10.7* 12.1 11.1* 12.6 11.7*  HCT 35.4* 38.3 35.9* 41.1 38.4  MCV 88.9 86.1 88.0 89.3 88.5  PLT 561* 639* 578* 682* 035*   Basic Metabolic Panel: Recent Labs  Lab 07/15/19 0249 07/16/19 0313 07/17/19 0309 07/20/19 0305 07/21/19 0229  NA 139 140 137 138 137  K 4.0 4.4 3.9 4.6 4.3  CL 108 108 106 104 102  CO2 21* 23 21* 23 27  GLUCOSE 130* 109* 91 121* 120*  BUN 8 10 9 14 11   CREATININE 0.39* 0.51 0.45 0.38* 0.43*  CALCIUM 8.9 8.7* 8.4* 8.8*  8.6*   GFR: Estimated Creatinine Clearance: 120.6 mL/min (A) (by C-G formula based on SCr of 0.43 mg/dL (L)). Liver Function Tests: Recent Labs  Lab 07/20/19 0305 07/21/19 0229  AST 50* 42*  ALT 77* 84*  ALKPHOS 81 72  BILITOT 0.5 0.6  PROT 7.3 6.2*  ALBUMIN 2.8* 2.5*   No results for input(s): LIPASE, AMYLASE in the last 168 hours. No results for input(s): AMMONIA in the last 168 hours. Coagulation Profile: No results for input(s): INR, PROTIME in the last 168 hours. Cardiac Enzymes: No results for input(s): CKTOTAL, CKMB, CKMBINDEX, TROPONINI in the last 168 hours. BNP (last 3 results) No results for input(s): PROBNP in the last 8760 hours. HbA1C: No results for input(s): HGBA1C in the last 72 hours. CBG: No results for input(s): GLUCAP in the last 168 hours. Lipid Profile: No results for input(s): CHOL, HDL, LDLCALC, TRIG, CHOLHDL, LDLDIRECT in the last 72 hours. Thyroid Function Tests: No results for input(s): TSH, T4TOTAL, FREET4, T3FREE, THYROIDAB in the last 72 hours. Anemia Panel: No results for input(s): VITAMINB12, FOLATE, FERRITIN, TIBC, IRON, RETICCTPCT in the last 72 hours. Sepsis Labs: No results for input(s): PROCALCITON, LATICACIDVEN in the last 168 hours.  Recent Results (from the past 240 hour(s))  Blood culture (routine x 2)     Status: None   Collection Time: 07/14/19  9:14 AM   Specimen: BLOOD  Result Value Ref Range Status   Specimen Description   Final    BLOOD RIGHT ANTECUBITAL Performed at [facility] [address], [place], [place] [address]    Special Requests   Final    BOTTLES DRAWN AEROBIC AND ANAEROBIC  Blood Culture adequate volume Performed at [facility] [address], [place], [place] [address]    Culture   Final    NO GROWTH 5 DAYS Performed at [facility], [place] [address] [place], [place] [address]    Report Status 07/19/2019 FINAL  Final  Blood culture (routine x 2)      Status: None   Collection Time: 07/14/19 10:15 AM   Specimen: BLOOD  Result Value Ref Range Status   Specimen Description   Final    BLOOD LEFT ANTECUBITAL Performed at [facility] [address], [place], [place] [address]    Special Requests   Final    BOTTLES DRAWN AEROBIC AND ANAEROBIC Blood Culture results may not be optimal due to an excessive volume of blood received in culture bottles Performed at [facility] [address] [place], [place] [address]    Culture   Final    NO GROWTH 5 DAYS Performed at [facility], [place] [address], [place], [place] [address]    Report Status 07/19/2019 FINAL  Final  SARS CORONAVIRUS 2 (TAT 6-24 HRS) Nasopharyngeal Nasopharyngeal Swab     Status: None   Collection Time: 07/14/19  2:45 PM   Specimen: Nasopharyngeal Swab  Result Value Ref Range Status   SARS Coronavirus 2 NEGATIVE NEGATIVE Final    Comment: (NOTE) SARS-CoV-2 target nucleic acids are NOT DETECTED. The SARS-CoV-2 RNA is generally detectable in upper and lower respiratory specimens during the acute phase of infection. Negative results do not preclude SARS-CoV-2 infection, do not rule out co-infections with other pathogens, and should not be used as the sole basis for treatment or other patient management decisions. Negative results must be combined with clinical observations, patient history, and epidemiological information. The expected result is Negative. Fact Sheet for Patients: SugarRoll.be Fact Sheet for Healthcare Providers: https://www.woods-mathews.com/ This test is not yet approved or cleared by the [place] FDA and  has been authorized for detection and/or diagnosis of SARS-CoV-2 by FDA under an Emergency Use Authorization (EUA). This EUA will remain  in effect (meaning this test can be used) for the duration of the COVID-19 declaration under Section 56 4(b)(1) of the Act,  21 U.S.C. section 360bbb-3(b)(1), unless the authorization is terminated or revoked sooner. Performed at [facility], [place] [address], [place], [place] [address]   MRSA PCR Screening     Status: None   Collection Time: 07/14/19  8:59 PM   Specimen: Nasal Mucosa; Nasopharyngeal  Result Value Ref Range Status   MRSA by PCR NEGATIVE NEGATIVE Final    Comment:        The GeneXpert MRSA Assay (FDA approved for NASAL specimens only), is one component of a comprehensive MRSA colonization surveillance program. It is not intended to diagnose MRSA infection nor to guide or monitor treatment for MRSA infections. Performed at [facility], [place] [address], [place], [place] [address]     Radiology Studies: No results found.  Scheduled Meds: . Chlorhexidine Gluconate Cloth  6 each Topical Daily  . enoxaparin (LOVENOX) injection  40 mg Subcutaneous Q24H  . guaiFENesin  600 mg Oral BID  . loratadine  10 mg Oral Daily  . mouth rinse  15 mL Mouth Rinse BID  . methimazole  5 mg Oral TID  . methylPREDNISolone (SOLU-MEDROL) injection  40 mg Intravenous Q6H  . propranolol  40 mg Oral TID   Continuous Infusions: . sodium chloride  Stopped (07/19/19 1000)     LOS: 7 days   Time spent: 40min  Zared Knoth C Caraline Deutschman, DO [facility]  If 7PM-7AM, please contact night-coverage [email or web address]  07/21/2019, 12:03 PM

## 2019-07-21 NOTE — Progress Notes (Signed)
Oncology Nurse Navigator Documentation  Oncology Nurse Navigator Flowsheets 07/21/2019  Diagnosis Status Confirmed Diagnosis Complete  Planned Course of Treatment Targeted Therapy  Phase of Treatment Targeted Therapy  Targeted Therapy Pending Reason: Authorization  Navigator Follow Up Date: 07/23/2019  Navigator Follow Up Reason: Appointment Review  Navigator Location [facility]  Referral Date to RadOnc/MedOnc -  Navigator Encounter Type Other/spoke with Dr. Julien Nordmann and Mikey Bussing NP regarding Sarah Chandler.  Patient needs a follow up with Dr. Julien Nordmann next week.  I notified scheduling to call and schedule her to be seen next week.   Patient Visit Type Inpatient  Treatment Phase Other  Barriers/Navigation Needs Coordination of Care  Interventions Coordination of Care  Acuity Level 2-Minimal Needs (1-2 Barriers Identified)  Coordination of Care Other  Time Spent with Patient 30

## 2019-07-22 ENCOUNTER — Encounter (HOSPITAL_COMMUNITY): Payer: Self-pay | Admitting: Internal Medicine

## 2019-07-22 ENCOUNTER — Telehealth: Payer: Self-pay | Admitting: Internal Medicine

## 2019-07-22 NOTE — Telephone Encounter (Signed)
Scheduled appt per 4/13 sch message - pt is aware of appt date and time

## 2019-07-22 NOTE — Telephone Encounter (Signed)
Patient is approved for Alecensa at no charge from [facility] 07/22/19 until notified.  [employer] phone [phone]  [facility] Patient [place] Phone [phone] Fax [phone] 07/22/2019 11:11 AM

## 2019-07-23 ENCOUNTER — Telehealth: Payer: Self-pay | Admitting: *Deleted

## 2019-07-23 NOTE — Telephone Encounter (Signed)
Oncology Nurse Navigator Documentation  Oncology Nurse Navigator Flowsheets 07/23/2019  Diagnosis Status -  Planned Course of Treatment -  Phase of Treatment -  Targeted Therapy Pending Reason: -  Navigator Follow Up Date: -  Navigator Follow Up Reason: -  Navigator Location [facility]  Referral Date to RadOnc/MedOnc -  Navigator Encounter Type Telephone/I spoke with Sarah Chandler.  She updated me that her mom is coming to pick her medication up today from the cancer center.  She will start her medication today.    Telephone Outgoing Call  Treatment Initiated Date 07/23/2019  Patient Visit Type Other  Treatment Phase Treatment  Barriers/Navigation Needs Education  Education Other  Interventions Education  Acuity Level 2-Minimal Needs (1-2 Barriers Identified)  Coordination of Care Other  Time Spent with Patient 15

## 2019-07-28 ENCOUNTER — Inpatient Hospital Stay (HOSPITAL_BASED_OUTPATIENT_CLINIC_OR_DEPARTMENT_OTHER): Payer: Medicaid Other | Admitting: Internal Medicine

## 2019-07-28 ENCOUNTER — Inpatient Hospital Stay: Payer: Medicaid Other

## 2019-07-28 ENCOUNTER — Encounter: Payer: Self-pay | Admitting: *Deleted

## 2019-07-28 ENCOUNTER — Other Ambulatory Visit: Payer: Self-pay | Admitting: Medical Oncology

## 2019-07-28 ENCOUNTER — Other Ambulatory Visit: Payer: Self-pay

## 2019-07-28 ENCOUNTER — Encounter: Payer: Self-pay | Admitting: Internal Medicine

## 2019-07-28 ENCOUNTER — Telehealth: Payer: Self-pay | Admitting: Internal Medicine

## 2019-07-28 VITALS — BP 123/70 | HR 100 | Temp 98.7°F | Resp 17 | Ht 70.0 in | Wt 198.1 lb

## 2019-07-28 DIAGNOSIS — Z79899 Other long term (current) drug therapy: Secondary | ICD-10-CM | POA: Insufficient documentation

## 2019-07-28 DIAGNOSIS — C3490 Malignant neoplasm of unspecified part of unspecified bronchus or lung: Secondary | ICD-10-CM

## 2019-07-28 DIAGNOSIS — Z7189 Other specified counseling: Secondary | ICD-10-CM | POA: Insufficient documentation

## 2019-07-28 DIAGNOSIS — Z5111 Encounter for antineoplastic chemotherapy: Secondary | ICD-10-CM | POA: Diagnosis not present

## 2019-07-28 DIAGNOSIS — R0602 Shortness of breath: Secondary | ICD-10-CM

## 2019-07-28 DIAGNOSIS — C7931 Secondary malignant neoplasm of brain: Secondary | ICD-10-CM | POA: Insufficient documentation

## 2019-07-28 DIAGNOSIS — C3492 Malignant neoplasm of unspecified part of left bronchus or lung: Secondary | ICD-10-CM | POA: Diagnosis not present

## 2019-07-28 DIAGNOSIS — C3491 Malignant neoplasm of unspecified part of right bronchus or lung: Secondary | ICD-10-CM | POA: Diagnosis not present

## 2019-07-28 LAB — CBC WITH DIFFERENTIAL (CANCER CENTER ONLY)
Abs Immature Granulocytes: 0.17 10*3/uL — ABNORMAL HIGH (ref 0.00–0.07)
Basophils Absolute: 0.1 10*3/uL (ref 0.0–0.1)
Basophils Relative: 0 %
Eosinophils Absolute: 0.1 10*3/uL (ref 0.0–0.5)
Eosinophils Relative: 0 %
HCT: 36.5 % (ref 36.0–46.0)
Hemoglobin: 11.7 g/dL — ABNORMAL LOW (ref 12.0–15.0)
Immature Granulocytes: 1 %
Lymphocytes Relative: 9 %
Lymphs Abs: 2.3 10*3/uL (ref 0.7–4.0)
MCH: 27.3 pg (ref 26.0–34.0)
MCHC: 32.1 g/dL (ref 30.0–36.0)
MCV: 85.3 fL (ref 80.0–100.0)
Monocytes Absolute: 0.8 10*3/uL (ref 0.1–1.0)
Monocytes Relative: 3 %
Neutro Abs: 20.8 10*3/uL — ABNORMAL HIGH (ref 1.7–7.7)
Neutrophils Relative %: 87 %
Platelet Count: 536 10*3/uL — ABNORMAL HIGH (ref 150–400)
RBC: 4.28 MIL/uL (ref 3.87–5.11)
RDW: 18.5 % — ABNORMAL HIGH (ref 11.5–15.5)
WBC Count: 24.2 10*3/uL — ABNORMAL HIGH (ref 4.0–10.5)
nRBC: 0 % (ref 0.0–0.2)

## 2019-07-28 LAB — CMP (CANCER CENTER ONLY)
ALT: 69 U/L — ABNORMAL HIGH (ref 0–44)
AST: 24 U/L (ref 15–41)
Albumin: 2.7 g/dL — ABNORMAL LOW (ref 3.5–5.0)
Alkaline Phosphatase: 121 U/L (ref 38–126)
Anion gap: 9 (ref 5–15)
BUN: 5 mg/dL — ABNORMAL LOW (ref 6–20)
CO2: 27 mmol/L (ref 22–32)
Calcium: 8.1 mg/dL — ABNORMAL LOW (ref 8.9–10.3)
Chloride: 102 mmol/L (ref 98–111)
Creatinine: 0.66 mg/dL (ref 0.44–1.00)
GFR, Est AFR Am: >60 mL/min (ref >60)
GFR, Estimated: >60 mL/min (ref >60)
Glucose, Bld: 119 mg/dL — ABNORMAL HIGH (ref 70–99)
Potassium: 3.8 mmol/L (ref 3.5–5.1)
Sodium: 138 mmol/L (ref 135–145)
Total Bilirubin: 0.3 mg/dL (ref 0.3–1.2)
Total Protein: 6.5 g/dL (ref 6.5–8.1)

## 2019-07-28 NOTE — Progress Notes (Signed)
Oncology Nurse Navigator Documentation  Oncology Nurse Navigator Flowsheets 07/28/2019  Diagnosis Status -  Planned Course of Treatment -  Phase of Treatment -  Targeted Therapy Pending Reason: -  Navigator Follow Up Date: -  Navigator Follow Up Reason: -  Navigator Location [facility]  Referral Date to RadOnc/MedOnc -  Navigator Encounter Type Clinic/MDC/Patient has not gotten her PET scan yet.  I called radiology and was able to get it scheduled.  I updated her on appt and pre-procedure instructions.   Telephone -  Treatment Initiated Date -  Patient Visit Type MedOnc  Treatment Phase Treatment  Barriers/Navigation Needs Coordination of Care;Education  Education Other  Interventions Coordination of Care;Education;Psycho-Social Support  Acuity Level 2-Minimal Needs (1-2 Barriers Identified)  Coordination of Care Appts  Education Method Verbal  Time Spent with Patient 30

## 2019-07-28 NOTE — Telephone Encounter (Signed)
Scheduled per los. Gave avs and calendar  

## 2019-07-28 NOTE — Progress Notes (Signed)
[facility] [phone]   [phone]  OFFICE PROGRESS NOTE  Mellody Dance, DO [place] [address]  DIAGNOSIS: Stage IV (T3, N2, M1c) non-small cell lung cancer, adenocarcinoma with ALK gene translocation presented with multiple bilateral pulmonary nodules and masses as well as small mediastinal and bilateral axillary lymphadenopathy and metastatic disease to the brain diagnosed in March 2021.  PRIOR THERAPY: None  CURRENT THERAPY: Alecensa (Alectinib) 600 mg p.o. twice daily.  First dose started on July 23 2019.  INTERVAL HISTORY: Sarah Chandler 34 y.o. female returns to the clinic today for follow-up visit.  The patient is feeling much better today.  She started the first dose of Alecensa (Alectinib) on 07/23/2019 evening.  She already notices a difference in her condition with improvement of her shortness of breath after 4 days of the treatment.  Her oxygen saturation at rest without oxygen is up to 97% but it dropped with exercise.  She is still on home oxygen 2 L/min.  The patient also noticed decrease in the size of the left supraclavicular lymph node.  She denied having any current fever or chills.  She has no nausea, vomiting, diarrhea or constipation.  She has no chest pain but continues to have the shortness of breath increased with exertion with mild cough and no hemoptysis.  She has no significant weight loss or night sweats she has a good appetite.  She is here today for evaluation and repeat blood work.  MEDICAL HISTORY: Past Medical History:  Diagnosis Date  . Cancer ([place])   . Pneumonia 05/08/2019    ALLERGIES:  is allergic to iron.  MEDICATIONS:  Current Outpatient Medications  Medication Sig Dispense Refill  . acetaminophen (TYLENOL) 500 MG tablet Take 1,000 mg by mouth every 6 (six) hours as needed for moderate pain or headache.    . albuterol (VENTOLIN HFA) 108 (90 Base) MCG/ACT inhaler Inhale 2 puffs  into the lungs every 6 (six) hours as needed for wheezing or shortness of breath. 8 g 1  . alectinib (ALECENSA) 150 MG capsule Take 600 mg by mouth 2 (two) times daily with a meal.    . guaifenesin (ROBITUSSIN) 100 MG/5ML syrup Take 200 mg by mouth 3 (three) times daily as needed for cough.    . hydrOXYzine (ATARAX/VISTARIL) 25 MG tablet Take 1 tablet (25 mg total) by mouth 3 (three) times daily as needed for anxiety. 30 tablet 0  . methimazole (TAPAZOLE) 5 MG tablet Take 1 tablet (5 mg total) by mouth 3 (three) times daily. 90 tablet 1  . Multiple Vitamin (MULTIVITAMIN WITH MINERALS) TABS tablet Take 2 tablets by mouth daily. Gummies    . OVER THE COUNTER MEDICATION Take 2 each by mouth daily as needed (anxiety). CBD gummies    . predniSONE (DELTASONE) 10 MG tablet Take 4 tablets (40 mg total) by mouth daily for 3 days, THEN 3 tablets (30 mg total) daily for 3 days, THEN 2 tablets (20 mg total) daily for 3 days, THEN 1 tablet (10 mg total) daily for 3 days. 30 tablet 0  . propranolol (INDERAL) 40 MG tablet Take 1 tablet (40 mg total) by mouth 3 (three) times daily. 90 tablet 1   No current facility-administered medications for this visit.    SURGICAL HISTORY:  Past Surgical History:  Procedure Laterality Date  . BIOPSY  07/03/2019   Procedure: BIOPSY;  Surgeon: Rigoberto Noel, MD;  Location: [facility] ENDOSCOPY;  Service: Cardiopulmonary;;  . BRONCHIAL BRUSHINGS  07/03/2019   Procedure: BRONCHIAL BRUSHINGS;  Surgeon: Rigoberto Noel, MD;  Location: [facility] ENDOSCOPY;  Service: Cardiopulmonary;;  . BRONCHIAL WASHINGS  07/03/2019   Procedure: BRONCHIAL WASHINGS;  Surgeon: Rigoberto Noel, MD;  Location: [facility] ENDOSCOPY;  Service: Cardiopulmonary;;  . NO PAST SURGERIES    . TOOTH EXTRACTION    . VIDEO BRONCHOSCOPY N/A 07/03/2019   Procedure: VIDEO BRONCHOSCOPY WITH FLUORO;  Surgeon: Rigoberto Noel, MD;  Location: [facility];  Service: Cardiopulmonary;  Laterality: N/A;  LMA    REVIEW OF SYSTEMS:   Constitutional: negative Eyes: negative Ears, nose, mouth, throat, and face: negative Respiratory: positive for dyspnea on exertion Cardiovascular: negative Gastrointestinal: negative Genitourinary:negative Integument/breast: negative Hematologic/lymphatic: negative Musculoskeletal:negative Neurological: negative Behavioral/Psych: negative Endocrine: negative Allergic/Immunologic: negative   PHYSICAL EXAMINATION: General appearance: alert, cooperative and no distress Head: Normocephalic, without obvious abnormality, atraumatic Neck: no adenopathy, no JVD, supple, symmetrical, trachea midline and thyroid not enlarged, symmetric, no tenderness/mass/nodules Lymph nodes: Cervical, supraclavicular, and axillary nodes normal. Resp: rales bilaterally Back: symmetric, no curvature. ROM normal. No CVA tenderness. Cardio: regular rate and rhythm, S1, S2 normal, no murmur, click, rub or gallop GI: soft, non-tender; bowel sounds normal; no masses,  no organomegaly Extremities: extremities normal, atraumatic, no cyanosis or edema Neurologic: Alert and oriented X 3, normal strength and tone. Normal symmetric reflexes. Normal coordination and gait  ECOG PERFORMANCE STATUS: 1 - Symptomatic but completely ambulatory  Blood pressure 123/70, pulse 100, temperature 98.7 F (37.1 C), temperature source Temporal, resp. rate 17, height '5\' 10"'  (1.778 m), weight 198 lb 1.6 oz (89.9 kg), SpO2 98 %.  LABORATORY DATA: Lab Results  Component Value Date   WBC 24.2 (H) 07/28/2019   HGB 11.7 (L) 07/28/2019   HCT 36.5 07/28/2019   MCV 85.3 07/28/2019   PLT 536 (H) 07/28/2019      Chemistry      Component Value Date/Time   NA 137 07/21/2019 0229   NA 139 05/19/2019 1146   K 4.3 07/21/2019 0229   CL 102 07/21/2019 0229   CO2 27 07/21/2019 0229   BUN 11 07/21/2019 0229   BUN 5 (L) 05/19/2019 1146   CREATININE 0.43 (L) 07/21/2019 0229      Component Value Date/Time   CALCIUM 8.6 (L) 07/21/2019 0229     ALKPHOS 72 07/21/2019 0229   AST 42 (H) 07/21/2019 0229   ALT 84 (H) 07/21/2019 0229   BILITOT 0.6 07/21/2019 0229       RADIOGRAPHIC STUDIES: CT Angio Chest PE W/Cm &/Or Wo Cm  Result Date: 07/14/2019 CLINICAL DATA:  Shortness of breath, recent diagnosis of lung cancer, COVID negative EXAM: CT ANGIOGRAPHY CHEST WITH CONTRAST TECHNIQUE: Multidetector CT imaging of the chest was performed using the standard protocol during bolus administration of intravenous contrast. Multiplanar CT image reconstructions and MIPs were obtained to evaluate the vascular anatomy. CONTRAST:  125m OMNIPAQUE IOHEXOL 350 MG/ML SOLN COMPARISON:  Radiograph same day, CT chest July 01, 2019 FINDINGS: Cardiovascular: There is a optimal opacification of the pulmonary arteries. There is no central,segmental, or subsegmental filling defects within the pulmonary arteries. The heart is normal in size. A trace pericardial effusion/thickening is seen. No evidence right heart strain. There is normal three-vessel brachiocephalic anatomy without proximal stenosis. The thoracic aorta is normal in appearance. Mediastinum/Nodes: Slight interval progression in the scattered mediastinal and bilateral axillary adenopathy. For example within the left axilla there is a 1.2 cm lymph node, series 4, image 32  and previously 1 cm. There is also 1.0 cm subcarinal lymph node which was previously 8 mm. Lungs/Pleura: There is interval progression in the multifocal extensive airspace patchy opacities throughout both lungs. There are areas of dense consolidation seen within the left lung base. Small bilateral pleural effusions are present, right greater than left. Upper Abdomen: Again noted are scattered left-sided para aortic lymph nodes. Musculoskeletal: Again noted are numerous tiny sclerotic foci within the visualized axial and appendicular skeleton. No destructive osseous lesion or fracture is identified. Review of the MIP images confirms the above  findings. IMPRESSION: 1. No central, segmental, or subsegmental pulmonary embolism. 2. Interval progression in extensive multifocal airspace opacities and consolidation, likely multifocal pneumonia however cannot exclude interval progression in the patient's known primary lung neoplasm with metastatic disease. 3. Small bilateral pleural effusions, left greater than right 4. Mediastinal and bilateral axillary adenopathy, with slight interval progression Electronically Signed   By: Prudencio Pair M.D.   On: 07/14/2019 11:40   CT Angio Chest PE W/Cm &/Or Wo Cm  Result Date: 07/01/2019 CLINICAL DATA:  Shortness of breath, cough, recent diagnosis of pneumonia EXAM: CT ANGIOGRAPHY CHEST WITH CONTRAST TECHNIQUE: Multidetector CT imaging of the chest was performed using the standard protocol during bolus administration of intravenous contrast. Multiplanar CT image reconstructions and MIPs were obtained to evaluate the vascular anatomy. CONTRAST:  67m OMNIPAQUE IOHEXOL 350 MG/ML SOLN COMPARISON:  05/08/2019, 07/01/2019 FINDINGS: Cardiovascular: This is a technically adequate evaluation of the pulmonary vasculature. There are no filling defects or pulmonary emboli. The heart is unremarkable without pericardial effusion. Thoracic aorta is normal in caliber with no evidence of dissection. Mediastinum/Nodes: There is bilateral axillary adenopathy. Largest lymph node in the right axilla measures 27 x 14 mm. Multiple subcentimeter lymph nodes within the mediastinum are stable. The thyroid, esophagus, and trachea are unremarkable. Lungs/Pleura: Since the prior exam, there has been marked progression of the bilateral nodular airspace disease. Marked increase in dense consolidation at the lung bases bilaterally. There is persistent diffuse interlobular septal thickening. Small bilateral pleural effusions have developed in the interim, right greater than left. No pneumothorax. The central airways are patent. Upper Abdomen: Multiple  subcentimeter lymph nodes are seen in the gastrohepatic ligament, largest measuring 9 mm reference image 116. No other acute abnormalities. Musculoskeletal: Innumerable punctate sclerotic foci are seen throughout the thoracic spine and sternum, nonspecific. There are no acute or destructive bony lesions. Review of the MIP images confirms the above findings. IMPRESSION: 1. Progression of the multifocal bilateral airspace disease seen previously. The persistence an appearance would favor atypical bacterial or fungal pneumonia. Correlation with bronchoscopy and sputum analysis may be useful. 2. Small bilateral pleural effusions, right greater than left. 3. Lymphadenopathy within the bilateral axillary regions, mediastinum, and upper abdomen, nonspecific. No significant change since prior study. 4. No evidence of pulmonary embolus. Electronically Signed   By: MRanda NgoM.D.   On: 07/01/2019 22:32   MR BRAIN W WO CONTRAST  Result Date: 07/08/2019 CLINICAL DATA:  Non-small-cell lung cancer staging EXAM: MRI HEAD WITHOUT AND WITH CONTRAST TECHNIQUE: Multiplanar, multiecho pulse sequences of the brain and surrounding structures were obtained without and with intravenous contrast. CONTRAST:  875mGADAVIST GADOBUTROL 1 MMOL/ML IV SOLN COMPARISON:  None. FINDINGS: Brain: Ventricle size normal. Negative for acute infarct. Negative for hemorrhage. Small enhancing lesions are present in the brain compatible with metastatic disease. 2 mm enhancing lesion right parietal operculum axial image 32 2 x 4 mm enhancing lesion right occipital parietal lobe  axial image 32 3 mm enhancing mass left posterior frontal cortex which appears anterior to the motor cortex axial image 43 2 mm enhancing lesion in the left frontal parietal lobe which may be within the motor cortex axial image 39. No significant brain edema.  No midline shift. Vascular: Normal arterial flow voids Skull and upper cervical spine: No focal skeletal lesion.  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 No significant edema or hemorrhage. Electronically Signed   By: Franchot Gallo M.D.   On: 07/08/2019 11:15   DG Chest Port 1 View  Result Date: 07/14/2019 CLINICAL DATA:  Shortness of breath.  Lung carcinoma EXAM: PORTABLE CHEST 1 VIEW COMPARISON:  Chest CT July 03, 2019 and CT angiogram chest July 01, 2019 FINDINGS: There is widespread airspace opacity throughout the lungs bilaterally with slight progression in consolidation in the lower lung regions compared to most recent study. There is airspace opacity adjacent to the left superior hilum, stable. There are fairly small pleural effusions as well. The heart size is normal. Pulmonary vascularity is grossly normal. No adenopathy evident by radiography. No bone lesions appreciable by radiography. IMPRESSION: Widespread airspace opacity consistent with multifocal pneumonia. Pleural effusions present. It should be noted that an underlying parenchymal neoplastic involvement in the lungs cannot be excluded, particularly given findings on recent CT. Stable cardiac silhouette. No adenopathy demonstrable by radiography; see prior chest CT report, however. Electronically Signed   By: Lowella Grip III M.D.   On: 07/14/2019 09:28   DG CHEST PORT 1 VIEW  Result Date: 07/03/2019 CLINICAL DATA:  Status post bronchoscopy for respiratory failure. EXAM: PORTABLE CHEST 1 VIEW COMPARISON:  July 01 2019 FINDINGS: The mediastinal contour and cardiac silhouette are stable. Bibasilar infiltrates are noted without interval change. Left suprahilar opacity is identified unchanged. There is no pleural line to suggest pneumothorax. The bony structures are stable. IMPRESSION: 1. No pneumothorax. 2. Bibasilar infiltrates unchanged. Left suprahilar opacity unchanged. Electronically Signed   By: Abelardo Diesel M.D.   On: 07/03/2019 15:18    DG Chest Portable 1 View  Result Date: 07/01/2019 CLINICAL DATA:  Shortness of breath. EXAM: PORTABLE CHEST 1 VIEW COMPARISON:  May 07, 2019 FINDINGS: Worsening bibasilar pulmonary infiltrates. Probable small associated effusions. The heart, hila, and mediastinum are unchanged. No other interval changes. IMPRESSION: Worsening bibasilar infiltrates with small associated effusions. This may represent pneumonia or aspiration. It would be unusual for pneumonia two persist since May 07, 2019 however. Recurrent pneumonia or repeated aspiration are possibilities. Recommend clinical correlation and short-term follow-up imaging to ensure resolution. Electronically Signed   By: Dorise Bullion III M.D   On: 07/01/2019 21:14   DG C-ARM BRONCHOSCOPY  Result Date: 07/03/2019 C-ARM BRONCHOSCOPY: Fluoroscopy was utilized by the requesting physician.  No radiographic interpretation.    ASSESSMENT AND PLAN: This is a very pleasant 34 years old white female recently diagnosed with stage IV non-small cell lung cancer, adenocarcinoma with positive ALK gene translocation in March 2021.  The patient started treatment with Alecensa on July 23, 2019 and within few days she started not significant improvement in her condition with less shortness of breath as well as decrease in supraclavicular lymphadenopathy. I recommended for the patient to continue her current treatment with Alecensa with the same dose. I will arrange for her to have a PET scan performed next week as a baseline and to rule out any other metastatic areas. For the  metastatic brain lesion, we will continue to monitor this closely and will consider the patient for repeat MRI of the brain with the first staging scan in 2 months after her treatment. She will come back for follow-up visit in 2 weeks for evaluation and repeat blood work. The patient was advised to call immediately if she has any other concerning symptoms in the interval. The patient  voices understanding of current disease status and treatment options and is in agreement with the current care plan.  All questions were answered. The patient knows to call the clinic with any problems, questions or concerns. We can certainly see the patient much sooner if necessary.  The total time spent in the appointment was 30 minutes.  Disclaimer: This note was dictated with voice recognition software. Similar sounding words can inadvertently be transcribed and may not be corrected upon review.

## 2019-07-29 ENCOUNTER — Telehealth: Payer: Self-pay | Admitting: Medical Oncology

## 2019-07-29 NOTE — Telephone Encounter (Signed)
It could be related to [facility] but the patient needs to have very effective contraceptive method to avoid pregnancy while she is on treatment.  This drug can cause fetal harm and she has to reach out to her primary care physician or gynecologist for effective contraceptive method while she is on treatment.  Thank you.

## 2019-07-29 NOTE — Telephone Encounter (Signed)
"  Does Alecensa affect menstrual cycle?" Her last menstrual cycle ended on 4/11 and she started bleeding again today. ( 11 days later)

## 2019-07-29 NOTE — Telephone Encounter (Signed)
Alecensa and fetal harm-Pt instructed that per [facility] ,  there is nothing in the literature regarding Alecensa interfering with menstrual cycles. However Alecensa can cause fetal abnormalities so if  she can potentially get pregnant and is sexually active she  needs to be on birth control and to contact PCP for contraception. She voiced understanding.

## 2019-08-05 ENCOUNTER — Encounter (HOSPITAL_COMMUNITY)
Admission: RE | Admit: 2019-08-05 | Discharge: 2019-08-05 | Disposition: A | Discharge status: Home / Self Care | Payer: Medicaid Other | Source: Ambulatory Visit | Attending: Internal Medicine | Admitting: Internal Medicine

## 2019-08-05 ENCOUNTER — Telehealth: Payer: Self-pay | Admitting: Emergency Medicine

## 2019-08-05 ENCOUNTER — Other Ambulatory Visit: Payer: Self-pay

## 2019-08-05 DIAGNOSIS — C3490 Malignant neoplasm of unspecified part of unspecified bronchus or lung: Secondary | ICD-10-CM | POA: Diagnosis not present

## 2019-08-05 LAB — GLUCOSE, CAPILLARY: Glucose-Capillary: 79 mg/dL (ref 70–99)

## 2019-08-05 MED ORDER — FLUDEOXYGLUCOSE F - 18 (FDG) INJECTION
10.2000 | Freq: Once | INTRAVENOUS | Status: AC | PRN
  Administered 2019-08-05: 10.2 via INTRAVENOUS

## 2019-08-05 NOTE — Telephone Encounter (Signed)
Received call on triage line.  Diane with [facility], read report on PET scan done this am on pt.  Particular attention requested for impressions 2 & 3 per radiologist, with emphasis on concern about impressions if patient is also experiencing any new or increased SOB.  Information passed along to [place] for MD [facility] office.

## 2019-08-06 LAB — FUNGUS CULTURE WITH STAIN

## 2019-08-06 LAB — FUNGUS CULTURE RESULT

## 2019-08-06 LAB — FUNGAL ORGANISM REFLEX

## 2019-08-11 ENCOUNTER — Encounter: Payer: Self-pay | Admitting: Internal Medicine

## 2019-08-11 ENCOUNTER — Inpatient Hospital Stay: Payer: Medicaid Other

## 2019-08-11 ENCOUNTER — Other Ambulatory Visit: Payer: Self-pay

## 2019-08-11 ENCOUNTER — Inpatient Hospital Stay: Payer: Medicaid Other | Attending: Internal Medicine | Admitting: Internal Medicine

## 2019-08-11 VITALS — BP 126/69 | HR 89 | Temp 97.8°F | Resp 18 | Ht 70.0 in | Wt 211.3 lb

## 2019-08-11 DIAGNOSIS — C3490 Malignant neoplasm of unspecified part of unspecified bronchus or lung: Secondary | ICD-10-CM

## 2019-08-11 DIAGNOSIS — C7931 Secondary malignant neoplasm of brain: Secondary | ICD-10-CM | POA: Insufficient documentation

## 2019-08-11 DIAGNOSIS — C7951 Secondary malignant neoplasm of bone: Secondary | ICD-10-CM | POA: Insufficient documentation

## 2019-08-11 DIAGNOSIS — Z5111 Encounter for antineoplastic chemotherapy: Secondary | ICD-10-CM

## 2019-08-11 DIAGNOSIS — C3491 Malignant neoplasm of unspecified part of right bronchus or lung: Secondary | ICD-10-CM | POA: Insufficient documentation

## 2019-08-11 LAB — CBC WITH DIFFERENTIAL (CANCER CENTER ONLY)
Abs Immature Granulocytes: 0.01 10*3/uL (ref 0.00–0.07)
Basophils Absolute: 0 10*3/uL (ref 0.0–0.1)
Basophils Relative: 1 %
Eosinophils Absolute: 0.5 10*3/uL (ref 0.0–0.5)
Eosinophils Relative: 9 %
HCT: 34.3 % — ABNORMAL LOW (ref 36.0–46.0)
Hemoglobin: 11 g/dL — ABNORMAL LOW (ref 12.0–15.0)
Immature Granulocytes: 0 %
Lymphocytes Relative: 36 %
Lymphs Abs: 2.2 10*3/uL (ref 0.7–4.0)
MCH: 28.9 pg (ref 26.0–34.0)
MCHC: 32.1 g/dL (ref 30.0–36.0)
MCV: 90 fL (ref 80.0–100.0)
Monocytes Absolute: 0.4 10*3/uL (ref 0.1–1.0)
Monocytes Relative: 6 %
Neutro Abs: 2.9 10*3/uL (ref 1.7–7.7)
Neutrophils Relative %: 48 %
Platelet Count: 313 10*3/uL (ref 150–400)
RBC: 3.81 MIL/uL — ABNORMAL LOW (ref 3.87–5.11)
RDW: 19.8 % — ABNORMAL HIGH (ref 11.5–15.5)
WBC Count: 6 10*3/uL (ref 4.0–10.5)
nRBC: 0 % (ref 0.0–0.2)

## 2019-08-11 LAB — CMP (CANCER CENTER ONLY)
ALT: 87 U/L — ABNORMAL HIGH (ref 0–44)
AST: 83 U/L — ABNORMAL HIGH (ref 15–41)
Albumin: 2.8 g/dL — ABNORMAL LOW (ref 3.5–5.0)
Alkaline Phosphatase: 160 U/L — ABNORMAL HIGH (ref 38–126)
Anion gap: 5 (ref 5–15)
BUN: 7 mg/dL (ref 6–20)
CO2: 28 mmol/L (ref 22–32)
Calcium: 8.2 mg/dL — ABNORMAL LOW (ref 8.9–10.3)
Chloride: 107 mmol/L (ref 98–111)
Creatinine: 0.72 mg/dL (ref 0.44–1.00)
GFR, Est AFR Am: >60 mL/min (ref >60)
GFR, Estimated: >60 mL/min (ref >60)
Glucose, Bld: 86 mg/dL (ref 70–99)
Potassium: 4.1 mmol/L (ref 3.5–5.1)
Sodium: 140 mmol/L (ref 135–145)
Total Bilirubin: 0.7 mg/dL (ref 0.3–1.2)
Total Protein: 6.3 g/dL — ABNORMAL LOW (ref 6.5–8.1)

## 2019-08-11 LAB — CK: Total CK: 48 U/L (ref 38–234)

## 2019-08-11 NOTE — Progress Notes (Signed)
[place] Telephone:([phone]) [phone]   Fax:([phone]) [phone]  OFFICE PROGRESS NOTE  [name], PA-C [place] [address]  DIAGNOSIS: Stage IV (T3, N2, M1c) non-small cell lung cancer, adenocarcinoma with ALK gene translocation presented with multiple bilateral pulmonary nodules and masses as well as small mediastinal and bilateral axillary lymphadenopathy and metastatic disease to the brain diagnosed in March 2021.  PRIOR THERAPY: None  CURRENT THERAPY: Alecensa (Alectinib) 600 mg p.o. twice daily.  First dose started on July 23 2019.  INTERVAL HISTORY: Sarah Chandler 34 y.o. female returns to the clinic today for follow-up visit.  The patient started feeling much better and less dependent on home oxygen.  She denied having any current chest pain but continues to have shortness of breath with exertion with mild cough and no hemoptysis.  She denied having any recent weight loss or night sweats.  She has no nausea, vomiting, diarrhea or constipation.  She denied having any headache or visual changes.  She has been tolerating her treatment with Alecensa fairly well.  She is here today for evaluation and repeat blood work.  MEDICAL HISTORY: Past Medical History:  Diagnosis Date  . Cancer ([place])   . Pneumonia 05/08/2019    ALLERGIES:  is allergic to iron.  MEDICATIONS:  Current Outpatient Medications  Medication Sig Dispense Refill  . acetaminophen (TYLENOL) 500 MG tablet Take 1,000 mg by mouth every 6 (six) hours as needed for moderate pain or headache.    . albuterol (VENTOLIN HFA) 108 (90 Base) MCG/ACT inhaler Inhale 2 puffs into the lungs every 6 (six) hours as needed for wheezing or shortness of breath. 8 g 1  . alectinib (ALECENSA) 150 MG capsule Take 600 mg by mouth 2 (two) times daily with a meal.    . guaifenesin (ROBITUSSIN) 100 MG/5ML syrup Take 200 mg by mouth 3 (three) times daily as needed for cough.    . hydrOXYzine  (ATARAX/VISTARIL) 25 MG tablet Take 1 tablet (25 mg total) by mouth 3 (three) times daily as needed for anxiety. 30 tablet 0  . methimazole (TAPAZOLE) 5 MG tablet Take 1 tablet (5 mg total) by mouth 3 (three) times daily. 90 tablet 1  . Multiple Vitamin (MULTIVITAMIN WITH MINERALS) TABS tablet Take 2 tablets by mouth daily. Gummies    . OVER THE COUNTER MEDICATION Take 2 each by mouth daily as needed (anxiety). CBD gummies    . propranolol (INDERAL) 40 MG tablet Take 1 tablet (40 mg total) by mouth 3 (three) times daily. 90 tablet 1   No current facility-administered medications for this visit.    SURGICAL HISTORY:  Past Surgical History:  Procedure Laterality Date  . BIOPSY  07/03/2019   Procedure: BIOPSY;  Surgeon: Rigoberto Noel, MD;  Location: [facility] ENDOSCOPY;  Service: Cardiopulmonary;;  . BRONCHIAL BRUSHINGS  07/03/2019   Procedure: BRONCHIAL BRUSHINGS;  Surgeon: Rigoberto Noel, MD;  Location: [facility] ENDOSCOPY;  Service: Cardiopulmonary;;  . BRONCHIAL WASHINGS  07/03/2019   Procedure: BRONCHIAL WASHINGS;  Surgeon: Rigoberto Noel, MD;  Location: [facility] ENDOSCOPY;  Service: Cardiopulmonary;;  . NO PAST SURGERIES    . TOOTH EXTRACTION    . VIDEO BRONCHOSCOPY N/A 07/03/2019   Procedure: VIDEO BRONCHOSCOPY WITH FLUORO;  Surgeon: Rigoberto Noel, MD;  Location: [place];  Service: Cardiopulmonary;  Laterality: N/A;  LMA    REVIEW OF SYSTEMS:  A comprehensive review of systems was negative except for: Respiratory: positive for  dyspnea on exertion   PHYSICAL EXAMINATION: General appearance: alert, cooperative, fatigued and no distress Head: Normocephalic, without obvious abnormality, atraumatic Neck: no adenopathy, no JVD, supple, symmetrical, trachea midline and thyroid not enlarged, symmetric, no tenderness/mass/nodules Lymph nodes: Cervical, supraclavicular, and axillary nodes normal. Resp: rales bilaterally Back: symmetric, no curvature. ROM normal. No CVA tenderness. Cardio: regular rate and  rhythm, S1, S2 normal, no murmur, click, rub or gallop GI: soft, non-tender; bowel sounds normal; no masses,  no organomegaly Extremities: extremities normal, atraumatic, no cyanosis or edema  ECOG PERFORMANCE STATUS: 1 - Symptomatic but completely ambulatory  Blood pressure 126/69, pulse 89, temperature 97.8 F (36.6 C), temperature source Oral, resp. rate 18, height '5\' 10"'  (1.778 m), weight 211 lb 4.8 oz (95.8 kg), last menstrual period 07/22/2019, SpO2 99 %.  LABORATORY DATA: Lab Results  Component Value Date   WBC 6.0 08/11/2019   HGB 11.0 (L) 08/11/2019   HCT 34.3 (L) 08/11/2019   MCV 90.0 08/11/2019   PLT 313 08/11/2019      Chemistry      Component Value Date/Time   NA 140 08/11/2019 1030   NA 139 05/19/2019 1146   K 4.1 08/11/2019 1030   CL 107 08/11/2019 1030   CO2 28 08/11/2019 1030   BUN 7 08/11/2019 1030   BUN 5 (L) 05/19/2019 1146   CREATININE 0.72 08/11/2019 1030      Component Value Date/Time   CALCIUM 8.2 (L) 08/11/2019 1030   ALKPHOS 160 (H) 08/11/2019 1030   AST 83 (H) 08/11/2019 1030   ALT 87 (H) 08/11/2019 1030   BILITOT 0.7 08/11/2019 1030       RADIOGRAPHIC STUDIES: CT Angio Chest PE W/Cm &/Or Wo Cm  Result Date: 07/14/2019 CLINICAL DATA:  Shortness of breath, recent diagnosis of lung cancer, COVID negative EXAM: CT ANGIOGRAPHY CHEST WITH CONTRAST TECHNIQUE: Multidetector CT imaging of the chest was performed using the standard protocol during bolus administration of intravenous contrast. Multiplanar CT image reconstructions and MIPs were obtained to evaluate the vascular anatomy. CONTRAST:  134m OMNIPAQUE IOHEXOL 350 MG/ML SOLN COMPARISON:  Radiograph same day, CT chest July 01, 2019 FINDINGS: Cardiovascular: There is a optimal opacification of the pulmonary arteries. There is no central,segmental, or subsegmental filling defects within the pulmonary arteries. The heart is normal in size. A trace pericardial effusion/thickening is seen. No evidence  right heart strain. There is normal three-vessel brachiocephalic anatomy without proximal stenosis. The thoracic aorta is normal in appearance. Mediastinum/Nodes: Slight interval progression in the scattered mediastinal and bilateral axillary adenopathy. For example within the left axilla there is a 1.2 cm lymph node, series 4, image 32 and previously 1 cm. There is also 1.0 cm subcarinal lymph node which was previously 8 mm. Lungs/Pleura: There is interval progression in the multifocal extensive airspace patchy opacities throughout both lungs. There are areas of dense consolidation seen within the left lung base. Small bilateral pleural effusions are present, right greater than left. Upper Abdomen: Again noted are scattered left-sided para aortic lymph nodes. Musculoskeletal: Again noted are numerous tiny sclerotic foci within the visualized axial and appendicular skeleton. No destructive osseous lesion or fracture is identified. Review of the MIP images confirms the above findings. IMPRESSION: 1. No central, segmental, or subsegmental pulmonary embolism. 2. Interval progression in extensive multifocal airspace opacities and consolidation, likely multifocal pneumonia however cannot exclude interval progression in the patient's known primary lung neoplasm with metastatic disease. 3. Small bilateral pleural effusions, left greater than right 4. Mediastinal and bilateral  axillary adenopathy, with slight interval progression Electronically Signed   By: Prudencio Pair M.D.   On: 07/14/2019 11:40   NM PET Image Initial (PI) Skull Base To Thigh  Result Date: 08/05/2019 CLINICAL DATA:  Initial treatment strategy for non-small cell lung cancer. EXAM: NUCLEAR MEDICINE PET SKULL BASE TO THIGH TECHNIQUE: 10.2 mCi F-18 FDG was injected intravenously. Full-ring PET imaging was performed from the skull base to thigh after the radiotracer. CT data was obtained and used for attenuation correction and anatomic localization.  Fasting blood glucose: 79 mg/dl COMPARISON:  07/14/2019 FINDINGS: Mediastinal blood pool activity: SUV max 1.90 Liver activity: SUV max NA NECK: No hypermetabolic lymph nodes in the neck. Incidental CT findings: None CHEST: Marked interval decrease in the soft tissue component associated with pulmonary lesions over the course of the last 3 examinations as comparing imaging from July 01, 2019 and July 14, 2019 to the current evaluation. LEFT upper lobe ground-glass at the site of mixed density on the most recent prior but without significant soft tissue mass seen in this location on the study from March of 2020 shows hypermetabolic features (SUVmax = 8.2) (image 65, series 4) this measures 3.9 x 3.2 cm Lingular consolidative changes and masslike features less confluent, seen along the fissure and seen with adjacent ground-glass attenuation (image 40, series 8) (SUVmax = 3.9 the areas of ground-glass within the adjacent LEFT lower lobe and the lingula show more FDG uptake than the fissural area. Consolidative change in this location measuring 4.7 x 1.8 cm as compared to 4.8 x 5.0 cm on the study of July 14, 2019 Minimal soft tissue persists along the LEFT mediastinal border. And nodular area in this location previously measured approximately 3.5 x 2.9 cm now with mixed density measuring approximately 3.1 x 2.5 cm. Variable metabolic activity (SUVmax = 4.7) Numerous areas of intense hypermetabolic activity associated with ground-glass attenuation. More confluent area in the RIGHT middle lobe associated with loculated pleural fluid is mainly ground-glass density is representative of areas of multifocal hypermetabolic change (image 78 of series 4) (SUVmax = 4.9.) This areas geographic measuring approximately 4.7 x 3.9 cm. More intense metabolic activity is present along the RIGHT lower lobe dependently. This is moderately hypermetabolic closer to areas in the LEFT upper lobe that were pure ground-glass. This shows mixed  septal thickening and ground-glass and involves the dependent RIGHT lower lobe on image 85 of series 4 (SUVmax = 6.4) is Lymph nodes: RIGHT axillary lymph nodes with interval decrease in size, largest 1.5 cm. Previously approximately 1.7 cm short axis (image 61 of series 4) (SUVmax = 4.2) smaller lymph nodes with slightly less FDG uptake in this location. Small RIGHT subpleural to oral lymph nodes were bulky on the previous exam, particularly the exam of March 24th of 2021. Largest node in this location 0.9 cm, previously 1.7 cm (image 55, series 4) (SUVmax = 2.5) Increased activity seen in the scalene musculature an upper intercostal musculature. Incidental CT findings: Heart size is stable. The diminished soft tissue along the LEFT and RIGHT heart border. No pericardial effusion. Aortic caliber is normal without significant atherosclerosis. Areas of ground-glass opacity in the chest as referenced above, diminished particularly along the lateral and anterior aspect of the chest compared to the previous study. Small amount of pleural fluid in the LEFT chest also slightly decreased, subjectively compared to the previous exam. ABDOMEN/PELVIS: Mildly enlarged hepatic gastric lymph nodes have diminished in size and or resolve, largest approximately 6 mm previously 9 mm  no associated increased metabolic activity above baseline. Focal area of increased FDG uptake at the gastroesophageal junction (SUVmax = 4.8 only slightly above adjacent stomach but more focal (image 95, series 4) no visible lesion or lymph node in this location.) No additional areas of FDG uptake or signs of nodal disease. Incidental CT findings: Liver is unremarkable on noncontrast imaging. No pericholecystic stranding. No biliary ductal dilation. Pancreas unremarkable. Spleen is normal. No evidence of retroperitoneal lymphadenopathy. No pelvic lymphadenopathy. Noncontrast appearance of uterus and adnexa are unremarkable. Visualized gastrointestinal  tract is normal. SKELETON: Heterogeneous appearance of skeletal structures in the setting of diffuse bony sclerosis. Marrow activity in the spine and elsewhere without distinguishing characteristics, no area greatly increased over any other area with maximum SUV of 2.6 in the midthoracic spine as an example but with similar SUV uptake throughout all visualized spinal levels. Incidental skeletal findings. No acute bone process. Multifocal areas of bony sclerosis with tiny sclerotic foci throughout all visualized bones. Most pronounced within the thoracic and lumbar spine as well as in the pelvis. IMPRESSION: 1. Marked interval decrease in soft tissue component with respect to pulmonary lesions and paramediastinal soft tissue. 2. Areas of ground-glass occupy areas of previous soft tissue and areas that did not show soft tissue density or masslike features on previous imaging. Areas of greatest metabolic activity seen in the LEFT upper lobe in particular did not show soft tissue involvement on prior imaging evaluations at least not to the extent of current ground-glass changes. Constellation of findings may represent a mixture of residual tumor and pneumonitis. 3. Increased metabolic activity in the scalene and intercostal musculature raising the question of increased work of breathing, correlate with any clinical evidence that would support the potential for superimposed pneumonitis as outlined above. 4. Decreased effusions over a series of prior imaging studies. 5. Diffuse skeletal sclerotic foci presumably metastatic disease but without areas of FDG uptake that distinguish 1 area from another on today's scan. Attention on follow-up. 6. These results will be called to the ordering clinician or representative by the Radiologist Assistant, and communication documented in the PACS or [employer]. Electronically Signed   By: Zetta Bills M.D.   On: 08/05/2019 09:38   DG Chest Port 1 View  Result Date:  07/14/2019 CLINICAL DATA:  Shortness of breath.  Lung carcinoma EXAM: PORTABLE CHEST 1 VIEW COMPARISON:  Chest CT July 03, 2019 and CT angiogram chest July 01, 2019 FINDINGS: There is widespread airspace opacity throughout the lungs bilaterally with slight progression in consolidation in the lower lung regions compared to most recent study. There is airspace opacity adjacent to the left superior hilum, stable. There are fairly small pleural effusions as well. The heart size is normal. Pulmonary vascularity is grossly normal. No adenopathy evident by radiography. No bone lesions appreciable by radiography. IMPRESSION: Widespread airspace opacity consistent with multifocal pneumonia. Pleural effusions present. It should be noted that an underlying parenchymal neoplastic involvement in the lungs cannot be excluded, particularly given findings on recent CT. Stable cardiac silhouette. No adenopathy demonstrable by radiography; see prior chest CT report, however. Electronically Signed   By: Lowella Grip III M.D.   On: 07/14/2019 09:28    ASSESSMENT AND PLAN: This is a very pleasant 34 years old white female recently diagnosed with stage IV non-small cell lung cancer, adenocarcinoma with positive ALK gene translocation in March 2021.  The patient started treatment with Alecensa on July 23, 2019 and within few days she started not significant improvement  in her condition with less shortness of breath as well as decrease in supraclavicular lymphadenopathy. The patient has been tolerating this treatment well with no concerning adverse effects. She had a PET scan for baseline assessment of her disease that already showing marked improvement in her disease. I recommended for the patient to continue with the current treatment with Capmatinib with the same dose. I will see the patient back for follow-up visit in 5 weeks for evaluation with repeat CT scan of the chest for restaging of her disease. The patient was  advised to call immediately if she has any concerning symptoms in the interval. The patient voices understanding of current disease status and treatment options and is in agreement with the current care plan.  All questions were answered. The patient knows to call the clinic with any problems, questions or concerns. We can certainly see the patient much sooner if necessary.  Disclaimer: This note was dictated with voice recognition software. Similar sounding words can inadvertently be transcribed and may not be corrected upon review.

## 2019-08-12 ENCOUNTER — Telehealth: Payer: Self-pay | Admitting: Internal Medicine

## 2019-08-12 NOTE — Telephone Encounter (Signed)
Scheduled per los. Called, not able to leave msg. Mailed printout  

## 2019-08-17 LAB — ACID FAST CULTURE WITH REFLEXED SENSITIVITIES (MYCOBACTERIA): Acid Fast Culture: NEGATIVE

## 2019-08-19 ENCOUNTER — Other Ambulatory Visit: Payer: Self-pay | Admitting: Internal Medicine

## 2019-08-19 DIAGNOSIS — E0590 Thyrotoxicosis, unspecified without thyrotoxic crisis or storm: Secondary | ICD-10-CM

## 2019-08-20 ENCOUNTER — Telehealth: Payer: Self-pay

## 2019-08-20 NOTE — Telephone Encounter (Signed)
  ATC pt, no answer. Left message for pt to call back.     Message Received: 1 week ago Message Contents  Rigoberto Noel, MD sent to Jannette Spanner, CMA  She needs routine FU OV with me -n ext available   RA

## 2019-09-01 NOTE — Telephone Encounter (Signed)
Sarah Chandler mother is returning phone call. Sarah Chandler phone number is [phone].

## 2019-09-04 ENCOUNTER — Other Ambulatory Visit: Payer: Self-pay

## 2019-09-09 ENCOUNTER — Encounter: Payer: Self-pay | Admitting: Internal Medicine

## 2019-09-09 ENCOUNTER — Ambulatory Visit (INDEPENDENT_AMBULATORY_CARE_PROVIDER_SITE_OTHER): Payer: Medicaid Other | Admitting: Internal Medicine

## 2019-09-09 ENCOUNTER — Other Ambulatory Visit: Payer: Self-pay

## 2019-09-09 VITALS — BP 122/62 | HR 83 | Temp 98.0°F | Ht 70.0 in | Wt 230.6 lb

## 2019-09-09 DIAGNOSIS — E0500 Thyrotoxicosis with diffuse goiter without thyrotoxic crisis or storm: Secondary | ICD-10-CM

## 2019-09-09 DIAGNOSIS — Z8639 Personal history of other endocrine, nutritional and metabolic disease: Secondary | ICD-10-CM | POA: Insufficient documentation

## 2019-09-09 DIAGNOSIS — E0590 Thyrotoxicosis, unspecified without thyrotoxic crisis or storm: Secondary | ICD-10-CM | POA: Diagnosis not present

## 2019-09-09 LAB — T4, FREE: Free T4: 0.22 ng/dL — ABNORMAL LOW (ref 0.60–1.60)

## 2019-09-09 LAB — TSH: TSH: 91.08 u[IU]/mL — ABNORMAL HIGH (ref 0.35–4.50)

## 2019-09-09 NOTE — Progress Notes (Signed)
Name: Sarah Chandler  MRN/ DOB: [id number], 03/09/1986    Age/ Sex: 34 y.o., female     PCP: [name], PA-C   Reason for Endocrinology Evaluation: Hyperthyroidism     Initial Endocrinology Clinic Visit: 06/08/2019    PATIENT IDENTIFIER: Sarah Chandler is a 34 y.o., female with a past medical history of hyperthyroidism and metastatic non-small cell carcinoma (06/2019). She has followed with [facility] since 06/08/2019 for consultative assistance with management of her hyperthyroidism.   HISTORICAL SUMMARY:  Pt presented to the ED on 05/10/2019 with palpitations and fatigue. She was noted to have a suppressed TSH < 0.005 uIU/mL with elevated FT4 at 1.70 ng/dL .She was started on Methimazole at the time.   No FH of thyroid disease SUBJECTIVE:   Today (09/09/2019):  [name] is here for a follow up on hyperthyroidism  She has been diagnosed with metastatic lung Ca and currently on Alectinib started 07/2019  She is having fatigue and muscle aches  She denies palpitations nor diarrhea.   Denies local neck symptoms   ROS:  As per HPI.   HISTORY:  Past Medical History:  Past Medical History:  Diagnosis Date  . Cancer ([place])   . Pneumonia 05/08/2019   Past Surgical History:  Past Surgical History:  Procedure Laterality Date  . BIOPSY  07/03/2019   Procedure: BIOPSY;  Surgeon: Rigoberto Noel, MD;  Location: [facility] ENDOSCOPY;  Service: Cardiopulmonary;;  . BRONCHIAL BRUSHINGS  07/03/2019   Procedure: BRONCHIAL BRUSHINGS;  Surgeon: Rigoberto Noel, MD;  Location: [facility] ENDOSCOPY;  Service: Cardiopulmonary;;  . BRONCHIAL WASHINGS  07/03/2019   Procedure: BRONCHIAL WASHINGS;  Surgeon: Rigoberto Noel, MD;  Location: [facility] ENDOSCOPY;  Service: Cardiopulmonary;;  . NO PAST SURGERIES    . TOOTH EXTRACTION    . VIDEO BRONCHOSCOPY N/A 07/03/2019   Procedure: VIDEO BRONCHOSCOPY WITH FLUORO;  Surgeon: Rigoberto Noel, MD;  Location: [place];  Service: Cardiopulmonary;   Laterality: N/A;  LMA    Social History:  reports that she has never smoked. She has never used smokeless tobacco. She reports that she does not drink alcohol or use drugs. Family History:  Family History  Problem Relation Age of Onset  . Diabetes Father   . Hyperlipidemia Father   . Hypertension Father   . Depression Maternal Uncle   . Diabetes Maternal Uncle   . Cancer Maternal Grandmother   . Hyperlipidemia Maternal Grandmother   . Cancer Paternal Grandfather      HOME MEDICATIONS: Allergies as of 09/09/2019      Reactions   Iron Other (See Comments)   Ferrosol - Liquid  Unknown reaction       Medication List       Accurate as of September 09, 2019  4:48 PM. If you have any questions, ask your nurse or doctor.        STOP taking these medications   propranolol 40 MG tablet Commonly known as: INDERAL Stopped by: Dorita Sciara, MD     TAKE these medications   acetaminophen 500 MG tablet Commonly known as: TYLENOL Take 1,000 mg by mouth every 6 (six) hours as needed for moderate pain or headache.   albuterol 108 (90 Base) MCG/ACT inhaler Commonly known as: VENTOLIN HFA Inhale 2 puffs into the lungs every 6 (six) hours as needed for wheezing or shortness of breath.   alectinib 150 MG capsule Commonly known as: ALECENSA Take 600 mg by mouth 2 (two) times  daily with a meal.   guaifenesin 100 MG/5ML syrup Commonly known as: ROBITUSSIN Take 200 mg by mouth 3 (three) times daily as needed for cough.   hydrOXYzine 25 MG tablet Commonly known as: ATARAX/VISTARIL Take 1 tablet (25 mg total) by mouth 3 (three) times daily as needed for anxiety.   methimazole 5 MG tablet Commonly known as: TAPAZOLE TAKE 1 TABLET BY MOUTH THREE TIMES DAILY   multivitamin with minerals Tabs tablet Take 2 tablets by mouth daily. Gummies   OVER THE COUNTER MEDICATION Take 2 each by mouth daily as needed (anxiety). CBD gummies         OBJECTIVE:   PHYSICAL EXAM: VS: BP  122/62 (BP Location: Right Arm, Patient Position: Sitting, Cuff Size: Large)   Pulse 83   Temp 98 F (36.7 C)   Ht 5\' 10"  (1.778 m)   Wt 230 lb 9.6 oz (104.6 kg)   LMP 09/04/2019 (Exact Date)   SpO2 99%   BMI 33.09 kg/m    EXAM: General: Pt appears well and is in NAD  Neck: General: Supple without adenopathy. Thyroid: Thyroid size normal.  No goiter or nodules appreciated. No thyroid bruit.  Lungs: Clear with good BS bilat with no rales, rhonchi, or wheezes  Heart: Auscultation: RRR.  Abdomen: Normoactive bowel sounds, soft, nontender, without masses or organomegaly palpable  Extremities:  BL LE: No pretibial edema normal ROM and strength.  Mental Status: Judgment, insight: Intact Orientation: Oriented to time, place, and person Mood and affect: No depression, anxiety, or agitation     DATA REVIEWED:  Results for TYKERIA, WAWRZYNIAK (MRN [id number]) as of 09/09/2019 16:49  Ref. Range 09/09/2019 14:29  TSH Latest Ref Range: 0.35 - 4.50 uIU/mL 91.08 (H)  T4,Free(Direct) Latest Ref Range: 0.60 - 1.60 ng/dL 0.22 (L)    ASSESSMENT / PLAN / RECOMMENDATIONS:   1. Hyperthyroidism Secondary to Graves' Disease:  - Pt is clinically and biochemically hypothyroid. This is due to excessive therapy with methimazole. Pt was advised to hold off on methimazole intake for now and recheck next week on 6/7th    Medications   Hold methimazole 5 mg, TID    F/U in 4 months Labs in 8 weeks   Labs discussed with the pt on 6/2 at 1650 with the above recommendations   Signed electronically by: Mack Guise, MD  [facility]  [facility] [address] [place], [place] [address] Phone: [phone] FAX: [phone]      CC: [name], PA-C [place] [address] Phone: [phone]  Fax: [phone]   Return to Endocrinology clinic as below: Future Appointments  Date Time Provider [place]    09/11/2019  8:00 AM [facility] 1 [facility] [place]  09/14/2019  9:00 AM [facility] LAB 3 [facility] None  09/15/2019  8:45 AM Curt Bears, MD [facility] None  11/06/2019  8:00 AM [facility] LAB [facility] None  01/15/2020  2:00 PM Jazzman Loughmiller, Melanie Crazier, MD [facility] None

## 2019-09-09 NOTE — Patient Instructions (Addendum)
-   Continue methimazole 5 mg , three times a day for now

## 2019-09-11 ENCOUNTER — Encounter (HOSPITAL_COMMUNITY): Payer: Self-pay

## 2019-09-11 ENCOUNTER — Ambulatory Visit (HOSPITAL_COMMUNITY)
Admission: RE | Admit: 2019-09-11 | Discharge: 2019-09-11 | Disposition: A | Discharge status: Home / Self Care | Payer: Medicaid Other | Source: Ambulatory Visit | Attending: Internal Medicine | Admitting: Internal Medicine

## 2019-09-11 ENCOUNTER — Other Ambulatory Visit: Payer: Self-pay

## 2019-09-11 DIAGNOSIS — C3490 Malignant neoplasm of unspecified part of unspecified bronchus or lung: Secondary | ICD-10-CM | POA: Insufficient documentation

## 2019-09-11 MED ORDER — IOHEXOL 300 MG/ML  SOLN
75.0000 mL | Freq: Once | INTRAMUSCULAR | Status: AC | PRN
  Administered 2019-09-11: 75 mL via INTRAVENOUS

## 2019-09-14 ENCOUNTER — Other Ambulatory Visit: Payer: Self-pay

## 2019-09-14 ENCOUNTER — Inpatient Hospital Stay: Payer: Medicaid Other | Attending: Internal Medicine

## 2019-09-14 ENCOUNTER — Other Ambulatory Visit (INDEPENDENT_AMBULATORY_CARE_PROVIDER_SITE_OTHER): Payer: Medicaid Other

## 2019-09-14 ENCOUNTER — Other Ambulatory Visit: Payer: Self-pay | Admitting: Internal Medicine

## 2019-09-14 DIAGNOSIS — C7931 Secondary malignant neoplasm of brain: Secondary | ICD-10-CM | POA: Insufficient documentation

## 2019-09-14 DIAGNOSIS — E039 Hypothyroidism, unspecified: Secondary | ICD-10-CM | POA: Diagnosis not present

## 2019-09-14 DIAGNOSIS — E0590 Thyrotoxicosis, unspecified without thyrotoxic crisis or storm: Secondary | ICD-10-CM | POA: Diagnosis not present

## 2019-09-14 DIAGNOSIS — C3491 Malignant neoplasm of unspecified part of right bronchus or lung: Secondary | ICD-10-CM | POA: Insufficient documentation

## 2019-09-14 DIAGNOSIS — C3492 Malignant neoplasm of unspecified part of left bronchus or lung: Secondary | ICD-10-CM | POA: Diagnosis not present

## 2019-09-14 DIAGNOSIS — I700 Atherosclerosis of aorta: Secondary | ICD-10-CM | POA: Insufficient documentation

## 2019-09-14 DIAGNOSIS — C3490 Malignant neoplasm of unspecified part of unspecified bronchus or lung: Secondary | ICD-10-CM

## 2019-09-14 LAB — CBC WITH DIFFERENTIAL (CANCER CENTER ONLY)
Abs Immature Granulocytes: 0.01 10*3/uL (ref 0.00–0.07)
Basophils Absolute: 0.1 10*3/uL (ref 0.0–0.1)
Basophils Relative: 1 %
Eosinophils Absolute: 0.4 10*3/uL (ref 0.0–0.5)
Eosinophils Relative: 6 %
HCT: 34.7 % — ABNORMAL LOW (ref 36.0–46.0)
Hemoglobin: 11.3 g/dL — ABNORMAL LOW (ref 12.0–15.0)
Immature Granulocytes: 0 %
Lymphocytes Relative: 42 %
Lymphs Abs: 2.9 10*3/uL (ref 0.7–4.0)
MCH: 29.7 pg (ref 26.0–34.0)
MCHC: 32.6 g/dL (ref 30.0–36.0)
MCV: 91.1 fL (ref 80.0–100.0)
Monocytes Absolute: 0.7 10*3/uL (ref 0.1–1.0)
Monocytes Relative: 10 %
Neutro Abs: 2.8 10*3/uL (ref 1.7–7.7)
Neutrophils Relative %: 41 %
Platelet Count: 269 10*3/uL (ref 150–400)
RBC: 3.81 MIL/uL — ABNORMAL LOW (ref 3.87–5.11)
RDW: 17.6 % — ABNORMAL HIGH (ref 11.5–15.5)
WBC Count: 6.8 10*3/uL (ref 4.0–10.5)
nRBC: 0 % (ref 0.0–0.2)

## 2019-09-14 LAB — T4, FREE: Free T4: 0.27 ng/dL — ABNORMAL LOW (ref 0.60–1.60)

## 2019-09-14 LAB — TSH: TSH: 84.72 u[IU]/mL — ABNORMAL HIGH (ref 0.35–4.50)

## 2019-09-14 LAB — CMP (CANCER CENTER ONLY)
ALT: 39 U/L (ref 0–44)
AST: 39 U/L (ref 15–41)
Albumin: 3.5 g/dL (ref 3.5–5.0)
Alkaline Phosphatase: 117 U/L (ref 38–126)
Anion gap: 9 (ref 5–15)
BUN: 12 mg/dL (ref 6–20)
CO2: 25 mmol/L (ref 22–32)
Calcium: 8.5 mg/dL — ABNORMAL LOW (ref 8.9–10.3)
Chloride: 106 mmol/L (ref 98–111)
Creatinine: 0.84 mg/dL (ref 0.44–1.00)
GFR, Est AFR Am: >60 mL/min (ref >60)
GFR, Estimated: >60 mL/min (ref >60)
Glucose, Bld: 81 mg/dL (ref 70–99)
Potassium: 4.5 mmol/L (ref 3.5–5.1)
Sodium: 140 mmol/L (ref 135–145)
Total Bilirubin: 0.9 mg/dL (ref 0.3–1.2)
Total Protein: 6.5 g/dL (ref 6.5–8.1)

## 2019-09-15 ENCOUNTER — Other Ambulatory Visit: Payer: Self-pay

## 2019-09-15 ENCOUNTER — Encounter: Payer: Self-pay | Admitting: Internal Medicine

## 2019-09-15 ENCOUNTER — Inpatient Hospital Stay (HOSPITAL_BASED_OUTPATIENT_CLINIC_OR_DEPARTMENT_OTHER): Payer: Medicaid Other | Admitting: Internal Medicine

## 2019-09-15 VITALS — BP 124/69 | HR 93 | Temp 97.7°F | Resp 17 | Ht 70.0 in | Wt 238.8 lb

## 2019-09-15 DIAGNOSIS — Z5111 Encounter for antineoplastic chemotherapy: Secondary | ICD-10-CM | POA: Diagnosis not present

## 2019-09-15 DIAGNOSIS — C3491 Malignant neoplasm of unspecified part of right bronchus or lung: Secondary | ICD-10-CM | POA: Diagnosis not present

## 2019-09-15 DIAGNOSIS — C3492 Malignant neoplasm of unspecified part of left bronchus or lung: Secondary | ICD-10-CM | POA: Diagnosis not present

## 2019-09-15 DIAGNOSIS — C3490 Malignant neoplasm of unspecified part of unspecified bronchus or lung: Secondary | ICD-10-CM | POA: Diagnosis not present

## 2019-09-15 DIAGNOSIS — C7931 Secondary malignant neoplasm of brain: Secondary | ICD-10-CM | POA: Diagnosis not present

## 2019-09-15 DIAGNOSIS — E039 Hypothyroidism, unspecified: Secondary | ICD-10-CM | POA: Diagnosis not present

## 2019-09-15 DIAGNOSIS — I700 Atherosclerosis of aorta: Secondary | ICD-10-CM | POA: Diagnosis not present

## 2019-09-15 NOTE — Progress Notes (Signed)
[place] [phone]   [phone]  OFFICE PROGRESS NOTE  [name], PA-C [place] [address]  DIAGNOSIS: Stage IV (T3, N2, M1c) non-small cell lung cancer, adenocarcinoma with ALK gene translocation presented with multiple bilateral pulmonary nodules and masses as well as small mediastinal and bilateral axillary lymphadenopathy and metastatic disease to the brain diagnosed in March 2021.  PRIOR THERAPY: None  CURRENT THERAPY: Alecensa (Alectinib) 600 mg p.o. twice daily.  First dose started on July 23 2019.  INTERVAL HISTORY: Sarah Chandler 34 y.o. female returns to the clinic today for follow-up visit accompanied by her cousin.  The patient is feeling much better today with no concerning complaints.  She has not currently on any home oxygen.  She is more active.  She gained more weight over the last few weeks.  She denied having any current chest pain, shortness of breath, cough or hemoptysis.  She denied having any fever or chills.  She has no nausea, vomiting, diarrhea or constipation.  She has no headache or visual changes.  The patient denied having any bleeding issues.  She has been tolerating her treatment with Alecensa fairly well.  She is here today for evaluation with repeat CT scan of the chest for restaging of her disease.  MEDICAL HISTORY: Past Medical History:  Diagnosis Date  . Cancer ([place])   . Pneumonia 05/08/2019    ALLERGIES:  is allergic to iron.  MEDICATIONS:  Current Outpatient Medications  Medication Sig Dispense Refill  . acetaminophen (TYLENOL) 500 MG tablet Take 1,000 mg by mouth every 6 (six) hours as needed for moderate pain or headache.    . albuterol (VENTOLIN HFA) 108 (90 Base) MCG/ACT inhaler Inhale 2 puffs into the lungs every 6 (six) hours as needed for wheezing or shortness of breath. 8 g 1  . alectinib (ALECENSA) 150 MG capsule Take 600 mg by mouth 2 (two) times daily  with a meal.    . guaifenesin (ROBITUSSIN) 100 MG/5ML syrup Take 200 mg by mouth 3 (three) times daily as needed for cough.    . hydrOXYzine (ATARAX/VISTARIL) 25 MG tablet Take 1 tablet (25 mg total) by mouth 3 (three) times daily as needed for anxiety. 30 tablet 0  . methimazole (TAPAZOLE) 5 MG tablet TAKE 1 TABLET BY MOUTH THREE TIMES DAILY 90 tablet 0  . Multiple Vitamin (MULTIVITAMIN WITH MINERALS) TABS tablet Take 2 tablets by mouth daily. Gummies    . OVER THE COUNTER MEDICATION Take 2 each by mouth daily as needed (anxiety). CBD gummies     No current facility-administered medications for this visit.    SURGICAL HISTORY:  Past Surgical History:  Procedure Laterality Date  . BIOPSY  07/03/2019   Procedure: BIOPSY;  Surgeon: Rigoberto Noel, MD;  Location: [facility] ENDOSCOPY;  Service: Cardiopulmonary;;  . BRONCHIAL BRUSHINGS  07/03/2019   Procedure: BRONCHIAL BRUSHINGS;  Surgeon: Rigoberto Noel, MD;  Location: [facility] ENDOSCOPY;  Service: Cardiopulmonary;;  . BRONCHIAL WASHINGS  07/03/2019   Procedure: BRONCHIAL WASHINGS;  Surgeon: Rigoberto Noel, MD;  Location: [facility] ENDOSCOPY;  Service: Cardiopulmonary;;  . NO PAST SURGERIES    . TOOTH EXTRACTION    . VIDEO BRONCHOSCOPY N/A 07/03/2019   Procedure: VIDEO BRONCHOSCOPY WITH FLUORO;  Surgeon: Rigoberto Noel, MD;  Location: [place];  Service: Cardiopulmonary;  Laterality: N/A;  LMA    REVIEW OF SYSTEMS:  Constitutional: negative Eyes: negative Ears, nose, mouth, throat,  and face: negative Respiratory: negative Cardiovascular: negative Gastrointestinal: negative Genitourinary:negative Integument/breast: negative Hematologic/lymphatic: negative Musculoskeletal:negative Neurological: negative Behavioral/Psych: negative Endocrine: negative Allergic/Immunologic: negative   PHYSICAL EXAMINATION: General appearance: alert, cooperative and no distress Head: Normocephalic, without obvious abnormality, atraumatic Neck: no adenopathy, no JVD,  supple, symmetrical, trachea midline and thyroid not enlarged, symmetric, no tenderness/mass/nodules Lymph nodes: Cervical, supraclavicular, and axillary nodes normal. Resp: clear to auscultation bilaterally Back: symmetric, no curvature. ROM normal. No CVA tenderness. Cardio: regular rate and rhythm, S1, S2 normal, no murmur, click, rub or gallop GI: soft, non-tender; bowel sounds normal; no masses,  no organomegaly Extremities: extremities normal, atraumatic, no cyanosis or edema Neurologic: Alert and oriented X 3, normal strength and tone. Normal symmetric reflexes. Normal coordination and gait  ECOG PERFORMANCE STATUS: 0 - Asymptomatic  Blood pressure 124/69, pulse 93, temperature 97.7 F (36.5 C), temperature source Temporal, resp. rate 17, height '5\' 10"'$  (1.778 m), weight 238 lb 12.8 oz (108.3 kg), last menstrual period 09/04/2019, SpO2 96 %.  LABORATORY DATA: Lab Results  Component Value Date   WBC 6.8 09/14/2019   HGB 11.3 (L) 09/14/2019   HCT 34.7 (L) 09/14/2019   MCV 91.1 09/14/2019   PLT 269 09/14/2019      Chemistry      Component Value Date/Time   NA 140 09/14/2019 0921   NA 139 05/19/2019 1146   K 4.5 09/14/2019 0921   CL 106 09/14/2019 0921   CO2 25 09/14/2019 0921   BUN 12 09/14/2019 0921   BUN 5 (L) 05/19/2019 1146   CREATININE 0.84 09/14/2019 0921      Component Value Date/Time   CALCIUM 8.5 (L) 09/14/2019 0921   ALKPHOS 117 09/14/2019 0921   AST 39 09/14/2019 0921   ALT 39 09/14/2019 0921   BILITOT 0.9 09/14/2019 0921       RADIOGRAPHIC STUDIES: CT Chest W Contrast  Result Date: 09/11/2019 CLINICAL DATA:  Non-small cell lung cancer staging. Lung cancer diagnosed in 2021. Currently undergoing systemic therapy EXAM: CT CHEST WITH CONTRAST TECHNIQUE: Multidetector CT imaging of the chest was performed during intravenous contrast administration. CONTRAST:  39m OMNIPAQUE IOHEXOL 300 MG/ML  SOLN COMPARISON:  07/14/2019 FINDINGS: Cardiovascular: Heart size  is stable. No pericardial effusion. Aortic caliber is normal. Venous assessment of central pulmonary vasculature is unremarkable. Mediastinum/Nodes: Thoracic inlet structures are normal. No axillary lymphadenopathy. Marked improvement of nearly confluent mediastinal soft tissue density that was noted on the previous chest CT. No discrete lymph nodes with residual stranding in the mediastinum but with less mass effect upon structures within the mediastinum when compared to the prior study. Best area for comparison is the subcarinal region where there was previously approximately 14 mm soft tissue, now without discrete soft tissue density. The esophagus is now visible as a discrete structure in the mediastinum posteriorly and is grossly unremarkable. Lungs/Pleura: Residual pleural thickening is circumferential and bilateral, areas of septal thickening within the chest persist but with marked improvement of the nearly confluent parenchymal disease and interstitial thickening that was seen on the previous exam. Nodular thickening above the RIGHT hemidiaphragm associated with inferior margin of the major fissure measures 2.8 x 2.2 cm. This area was nearly confluent with soft tissue density throughout the entire lung on the previous exam Numerous areas of subcentimeter nodularity are associated with extensive irregular septal and fissural thickening in the chest. Resolution of pleural fluid that was seen on the previous exam. Upper Abdomen: Adenopathy noted on the previous study in the upper abdomen is  no longer evident. This area shows limited assessment on today's study without new or acute finding. Musculoskeletal: No chest wall mass. Multifocal bony sclerosis is again noted without interval change. No acute musculoskeletal process. IMPRESSION: 1. Marked improvement of nearly confluent mediastinal soft tissue density that was noted on the previous chest CT, following the trend of improvement that was noted on the  PET-CT performed on August 05, 2019. 2. Marked improvement of parenchymal and interstitial disease following a trend seen on previous imaging studies as described. Nodule along the major fissure in the inferior RIGHT chest chest is measured for reference. There is a similar area along the lingular interface with the major fissure in the LEFT chest. Findings remain suspicious for lymphangitic carcinomatosis but show marked improvement. 3. Resolution of upper abdominal adenopathy since the prior chest CT. 4. Aortic atherosclerosis. Aortic Atherosclerosis (ICD10-I70.0). Electronically Signed   By: Zetta Bills M.D.   On: 09/11/2019 13:29    ASSESSMENT AND PLAN: This is a very pleasant 34 years old white female recently diagnosed with stage IV non-small cell lung cancer, adenocarcinoma with positive ALK gene translocation in March 2021.  The patient started treatment with Alecensa on July 23, 2019 and within few days she started not significant improvement in her condition with less shortness of breath as well as decrease in supraclavicular lymphadenopathy. The patient is feeling much better now and she does not require any home oxygen.  She is more active.  She gained several pounds since her last visit.  She has been tolerating her treatment with Alecensa fairly well. The patient had repeat CT scan of the chest performed recently.  I personally and independently reviewed the scan images and discussed the result and showed the images to the patient and her cousin today. Her scan showed significant improvement of her disease. I recommended for her to continue her current treatment with Alecensa with the same dose. I will see her back for follow-up visit in 1 months for evaluation with repeat blood work. For the hypothyroidism, she is followed by her endocrinologist and primary care provider. The patient was advised to call immediately if she has any concerning symptoms in the interval. The patient voices  understanding of current disease status and treatment options and is in agreement with the current care plan.  All questions were answered. The patient knows to call the clinic with any problems, questions or concerns. We can certainly see the patient much sooner if necessary.  Disclaimer: This note was dictated with voice recognition software. Similar sounding words can inadvertently be transcribed and may not be corrected upon review.

## 2019-09-16 ENCOUNTER — Telehealth: Payer: Self-pay | Admitting: Internal Medicine

## 2019-09-16 ENCOUNTER — Other Ambulatory Visit: Payer: Self-pay | Admitting: Medical Oncology

## 2019-09-16 DIAGNOSIS — C3490 Malignant neoplasm of unspecified part of unspecified bronchus or lung: Secondary | ICD-10-CM

## 2019-09-16 MED ORDER — ALECTINIB HCL 150 MG PO CAPS: 600.0000 mg | ORAL_CAPSULE | Freq: Two times a day (BID) | ORAL | 1 refills | Status: DC

## 2019-09-16 NOTE — Telephone Encounter (Signed)
Scheduled apt per 6/8 los - pt mother is aware of appts.

## 2019-09-17 NOTE — Telephone Encounter (Signed)
We do not have samples and I also called [facility] to see if they could expedite, and it is already marked as expedite. They are behind from the holiday weekend and Friday is the soonest they can get med to her.  It would be just as long if not longer for [place] to obtain more samples also.  Patient should call for a refill as soon as she gets her next shipment. They are 2-3 weeks behind on shipping.  [facility] [place] Phone [phone] Fax [phone] 09/17/2019 10:25 AM

## 2019-09-17 NOTE — Telephone Encounter (Signed)
[facility] refill will be her next Friday . She only has enough pills through Tuesday.will not

## 2019-09-21 ENCOUNTER — Telehealth: Payer: Self-pay | Admitting: Medical Oncology

## 2019-09-21 ENCOUNTER — Other Ambulatory Visit: Payer: Self-pay | Admitting: Medical Oncology

## 2019-09-21 DIAGNOSIS — C3490 Malignant neoplasm of unspecified part of unspecified bronchus or lung: Secondary | ICD-10-CM

## 2019-09-21 NOTE — Telephone Encounter (Signed)
I told Sarah Chandler that Dr Julien Nordmann does not want her to spit her alcensa to cover the 3 days she will not have medication.  I instructed her to call if she has any concerns or new symptoms.

## 2019-09-22 LAB — GUARDANT 360

## 2019-09-24 NOTE — Telephone Encounter (Signed)
Error

## 2019-09-25 ENCOUNTER — Telehealth: Payer: Self-pay | Admitting: Medical Oncology

## 2019-09-25 ENCOUNTER — Other Ambulatory Visit: Payer: Self-pay | Admitting: Medical Oncology

## 2019-09-25 DIAGNOSIS — C3490 Malignant neoplasm of unspecified part of unspecified bronchus or lung: Secondary | ICD-10-CM

## 2019-09-25 MED ORDER — ALECTINIB HCL 150 MG PO CAPS: 600.0000 mg | ORAL_CAPSULE | Freq: Two times a day (BID) | ORAL | 1 refills | Status: DC

## 2019-09-25 NOTE — Progress Notes (Signed)
Faxed Alecensa Rx to [facility].

## 2019-09-25 NOTE — Telephone Encounter (Signed)
Please send in refill for next month-She received a supply today -it was 4 days late and she missed 3 days of drug. She does not want to run short again and miss more treatment days . Refill request sent.

## 2019-10-05 ENCOUNTER — Encounter: Payer: Self-pay | Admitting: Internal Medicine

## 2019-10-05 ENCOUNTER — Ambulatory Visit: Payer: Medicaid Other | Admitting: Internal Medicine

## 2019-10-05 ENCOUNTER — Other Ambulatory Visit: Payer: Self-pay

## 2019-10-05 VITALS — BP 118/62 | HR 75 | Ht 70.0 in | Wt 228.2 lb

## 2019-10-05 DIAGNOSIS — E0590 Thyrotoxicosis, unspecified without thyrotoxic crisis or storm: Secondary | ICD-10-CM

## 2019-10-05 DIAGNOSIS — E559 Vitamin D deficiency, unspecified: Secondary | ICD-10-CM | POA: Diagnosis not present

## 2019-10-05 DIAGNOSIS — E0500 Thyrotoxicosis with diffuse goiter without thyrotoxic crisis or storm: Secondary | ICD-10-CM

## 2019-10-05 DIAGNOSIS — M2550 Pain in unspecified joint: Secondary | ICD-10-CM | POA: Diagnosis not present

## 2019-10-05 LAB — T4, FREE: Free T4: 0.82 ng/dL (ref 0.60–1.60)

## 2019-10-05 LAB — TSH: TSH: 9.84 u[IU]/mL — ABNORMAL HIGH (ref 0.35–4.50)

## 2019-10-05 LAB — VITAMIN D 25 HYDROXY (VIT D DEFICIENCY, FRACTURES): VITD: 17.24 ng/mL — ABNORMAL LOW (ref 30.00–100.00)

## 2019-10-05 NOTE — Progress Notes (Signed)
Name: Sarah Chandler  MRN/ DOB: [id number], Aug 19, 1985    Age/ Sex: 34 y.o., female     PCP: [name], PA-C   Reason for Endocrinology Evaluation: Hyperthyroidism     Initial Endocrinology Clinic Visit: 06/08/2019    PATIENT IDENTIFIER: Sarah Chandler is a 34 y.o., female with a past medical history of hyperthyroidism and metastatic non-small cell carcinoma (06/2019). She has followed with [facility] since 06/08/2019 for consultative assistance with management of her hyperthyroidism.   HISTORICAL SUMMARY:  Pt presented to the ED on 05/10/2019 with palpitations and fatigue. She was noted to have a suppressed TSH < 0.005 uIU/mL with elevated FT4 at 1.70 ng/dL .She was started on Methimazole at the time.   In the meantime she was diagnosed with metastatic lung ca and was on Alectinib. TSH trended up to 91 uIU/mL by 09/2019 and methimazole was stopped.     No FH of thyroid disease SUBJECTIVE:   Today (10/05/2019):  Sarah Chandler is here for a follow up on hyperthyroidism.  She has been diagnosed with metastatic lung Ca and currently on Alectinib started 07/2019   She has been noted with weight loss since her last visit here and stopping the methimazole She continues with fatigue and arthralgias in the legs  Denies constipation     ROS:  As per HPI.   HISTORY:  Past Medical History:  Past Medical History:  Diagnosis Date  . Cancer ([place])   . Pneumonia 05/08/2019   Past Surgical History:  Past Surgical History:  Procedure Laterality Date  . BIOPSY  07/03/2019   Procedure: BIOPSY;  Surgeon: Rigoberto Noel, MD;  Location: [facility] ENDOSCOPY;  Service: Cardiopulmonary;;  . BRONCHIAL BRUSHINGS  07/03/2019   Procedure: BRONCHIAL BRUSHINGS;  Surgeon: Rigoberto Noel, MD;  Location: [facility] ENDOSCOPY;  Service: Cardiopulmonary;;  . BRONCHIAL WASHINGS  07/03/2019   Procedure: BRONCHIAL WASHINGS;  Surgeon: Rigoberto Noel, MD;  Location: [facility] ENDOSCOPY;  Service:  Cardiopulmonary;;  . NO PAST SURGERIES    . TOOTH EXTRACTION    . VIDEO BRONCHOSCOPY N/A 07/03/2019   Procedure: VIDEO BRONCHOSCOPY WITH FLUORO;  Surgeon: Rigoberto Noel, MD;  Location: [place];  Service: Cardiopulmonary;  Laterality: N/A;  LMA    Social History:  reports that she has never smoked. She has never used smokeless tobacco. She reports that she does not drink alcohol and does not use drugs. Family History:  Family History  Problem Relation Age of Onset  . Diabetes Father   . Hyperlipidemia Father   . Hypertension Father   . Depression Maternal Uncle   . Diabetes Maternal Uncle   . Cancer Maternal Grandmother   . Hyperlipidemia Maternal Grandmother   . Cancer Paternal Grandfather      HOME MEDICATIONS: Allergies as of 10/05/2019      Reactions   Iron Other (See Comments)   Ferrosol - Liquid  Unknown reaction       Medication List       Accurate as of October 05, 2019 11:48 AM. If you have any questions, ask your nurse or doctor.        acetaminophen 500 MG tablet Commonly known as: TYLENOL Take 1,000 mg by mouth every 6 (six) hours as needed for moderate pain or headache.   albuterol 108 (90 Base) MCG/ACT inhaler Commonly known as: VENTOLIN HFA Inhale 2 puffs into the lungs every 6 (six) hours as needed for wheezing or shortness of breath.   alectinib 150  MG capsule Commonly known as: ALECENSA Take 4 capsules (600 mg total) by mouth 2 (two) times daily with a meal. This is for future refills -Today she received a supply from [facility] from a rx dated 6/9. With this rx  she should have one more refill on that rx. She was out of drug for 3 days. We are trying to avoid it from happening again. If you have question scall [phone]   guaifenesin 100 MG/5ML syrup Commonly known as: ROBITUSSIN Take 200 mg by mouth 3 (three) times daily as needed for cough.   hydrOXYzine 25 MG tablet Commonly known as: ATARAX/VISTARIL Take 1 tablet (25 mg total) by mouth 3  (three) times daily as needed for anxiety.   methimazole 5 MG tablet Commonly known as: TAPAZOLE TAKE 1 TABLET BY MOUTH THREE TIMES DAILY   multivitamin with minerals Tabs tablet Take 2 tablets by mouth daily. Gummies   OVER THE COUNTER MEDICATION Take 2 each by mouth daily as needed (anxiety). CBD gummies         OBJECTIVE:   PHYSICAL EXAM: VS: BP 118/62 (BP Location: Left Arm, Patient Position: Sitting, Cuff Size: Large)   Pulse 75   Ht 5\' 10"  (1.778 m)   Wt 228 lb 3.2 oz (103.5 kg)   SpO2 99%   BMI 32.74 kg/m    EXAM: General: Pt appears well and is in NAD  Neck: General: Supple without adenopathy. Thyroid: Thyroid size normal.  No goiter or nodules appreciated. No thyroid bruit.  Lungs: Clear with good BS bilat with no rales, rhonchi, or wheezes  Heart: Auscultation: RRR.  Abdomen: Normoactive bowel sounds, soft, nontender, without masses or organomegaly palpable  Extremities:  BL LE: No pretibial edema normal ROM and strength.  Mental Status: Judgment, insight: Intact Orientation: Oriented to time, place, and person Mood and affect: No depression, anxiety, or agitation     DATA REVIEWED: Results for Sarah Chandler, Sarah Chandler (MRN [id number]) as of 10/06/2019 08:14  Ref. Range 10/05/2019 08:13  VITD Latest Ref Range: 30.00 - 100.00 ng/mL 17.24 (L)  TSH Latest Ref Range: 0.35 - 4.50 uIU/mL 9.84 (H)  T4,Free(Direct) Latest Ref Range: 0.60 - 1.60 ng/dL 0.82     ASSESSMENT / PLAN / RECOMMENDATIONS:   1. 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  - She is biochemically hypothyroid, but TSH has come down rapidly and will restart Methimazole at a small dose as below     Medications   Restart  methimazole 5 mg,at half a tablet daily     2. Vitamin D deficiency:   - Will start OTC Vitamin D3 2000 iu daily    F/U in 2 months Labs in 3 weeks    Signed electronically by: Mack Guise, MD  [facility]  [facility] [address] [place], [place] [address] Phone: [phone] FAX: [phone]      CC: [name], PA-C [place] [address] Phone: [phone]  Fax: [phone]   Return to Endocrinology clinic as below: Future Appointments  Date Time Provider [place]  10/19/2019  2:45 PM [facility] 3 [facility] None  10/19/2019  3:15 PM Curt Bears, MD [facility] None  11/06/2019  8:00 AM [facility] LAB [facility] None  12/25/2019  8:10 AM Gianpaolo Mindel, Melanie Crazier, MD [facility] None  01/15/2020  2:00 PM Joanna Hall, Melanie Crazier, MD [facility] None

## 2019-10-06 DIAGNOSIS — E559 Vitamin D deficiency, unspecified: Secondary | ICD-10-CM | POA: Insufficient documentation

## 2019-10-06 MED ORDER — METHIMAZOLE 5 MG PO TABS: 2.5000 mg | ORAL_TABLET | Freq: Every day | ORAL | 3 refills | Status: DC

## 2019-10-19 ENCOUNTER — Encounter: Payer: Self-pay | Admitting: Internal Medicine

## 2019-10-19 ENCOUNTER — Other Ambulatory Visit: Payer: Self-pay

## 2019-10-19 ENCOUNTER — Inpatient Hospital Stay: Payer: Medicaid Other

## 2019-10-19 ENCOUNTER — Inpatient Hospital Stay: Payer: Medicaid Other | Attending: Internal Medicine | Admitting: Internal Medicine

## 2019-10-19 ENCOUNTER — Telehealth: Payer: Self-pay | Admitting: Internal Medicine

## 2019-10-19 VITALS — BP 145/75 | HR 93 | Temp 98.1°F | Resp 18 | Ht 70.0 in | Wt 236.6 lb

## 2019-10-19 DIAGNOSIS — E0590 Thyrotoxicosis, unspecified without thyrotoxic crisis or storm: Secondary | ICD-10-CM

## 2019-10-19 DIAGNOSIS — C3491 Malignant neoplasm of unspecified part of right bronchus or lung: Secondary | ICD-10-CM | POA: Diagnosis present

## 2019-10-19 DIAGNOSIS — Z5111 Encounter for antineoplastic chemotherapy: Secondary | ICD-10-CM

## 2019-10-19 DIAGNOSIS — Z79899 Other long term (current) drug therapy: Secondary | ICD-10-CM | POA: Diagnosis not present

## 2019-10-19 DIAGNOSIS — C7931 Secondary malignant neoplasm of brain: Secondary | ICD-10-CM | POA: Insufficient documentation

## 2019-10-19 DIAGNOSIS — C3490 Malignant neoplasm of unspecified part of unspecified bronchus or lung: Secondary | ICD-10-CM

## 2019-10-19 LAB — CBC WITH DIFFERENTIAL (CANCER CENTER ONLY)
Abs Immature Granulocytes: 0.02 10*3/uL (ref 0.00–0.07)
Basophils Absolute: 0.1 10*3/uL (ref 0.0–0.1)
Basophils Relative: 1 %
Eosinophils Absolute: 0.2 10*3/uL (ref 0.0–0.5)
Eosinophils Relative: 2 %
HCT: 32.8 % — ABNORMAL LOW (ref 36.0–46.0)
Hemoglobin: 10.9 g/dL — ABNORMAL LOW (ref 12.0–15.0)
Immature Granulocytes: 0 %
Lymphocytes Relative: 34 %
Lymphs Abs: 3.1 10*3/uL (ref 0.7–4.0)
MCH: 30.5 pg (ref 26.0–34.0)
MCHC: 33.2 g/dL (ref 30.0–36.0)
MCV: 91.9 fL (ref 80.0–100.0)
Monocytes Absolute: 0.8 10*3/uL (ref 0.1–1.0)
Monocytes Relative: 8 %
Neutro Abs: 4.9 10*3/uL (ref 1.7–7.7)
Neutrophils Relative %: 55 %
Platelet Count: 277 10*3/uL (ref 150–400)
RBC: 3.57 MIL/uL — ABNORMAL LOW (ref 3.87–5.11)
RDW: 14.8 % (ref 11.5–15.5)
WBC Count: 9.1 10*3/uL (ref 4.0–10.5)
nRBC: 0 % (ref 0.0–0.2)

## 2019-10-19 LAB — CMP (CANCER CENTER ONLY)
ALT: 18 U/L (ref 0–44)
AST: 19 U/L (ref 15–41)
Albumin: 3.9 g/dL (ref 3.5–5.0)
Alkaline Phosphatase: 112 U/L (ref 38–126)
Anion gap: 10 (ref 5–15)
BUN: 12 mg/dL (ref 6–20)
CO2: 22 mmol/L (ref 22–32)
Calcium: 8.8 mg/dL — ABNORMAL LOW (ref 8.9–10.3)
Chloride: 108 mmol/L (ref 98–111)
Creatinine: 0.76 mg/dL (ref 0.44–1.00)
GFR, Est AFR Am: >60 mL/min (ref >60)
GFR, Estimated: >60 mL/min (ref >60)
Glucose, Bld: 82 mg/dL (ref 70–99)
Potassium: 3.9 mmol/L (ref 3.5–5.1)
Sodium: 140 mmol/L (ref 135–145)
Total Bilirubin: 1.1 mg/dL (ref 0.3–1.2)
Total Protein: 7.1 g/dL (ref 6.5–8.1)

## 2019-10-19 LAB — CK: Total CK: 61 U/L (ref 38–234)

## 2019-10-19 NOTE — Telephone Encounter (Signed)
Scheduled appt per 7/12 los   - pt is aware of appt.

## 2019-10-19 NOTE — Progress Notes (Signed)
[facility] [phone]   Fax:(336) [phone]  OFFICE PROGRESS NOTE  [name], PA-C [place] [address]  DIAGNOSIS: Stage IV (T3, N2, M1c) non-small cell lung cancer, adenocarcinoma with ALK gene translocation presented with multiple bilateral pulmonary nodules and masses as well as small mediastinal and bilateral axillary lymphadenopathy and metastatic disease to the brain diagnosed in March 2021.  PRIOR THERAPY: None  CURRENT THERAPY: Alecensa (Alectinib) 600 mg p.o. twice daily.  First dose started on July 23 2019.  Status post 3 months of treatment.  INTERVAL HISTORY: Sarah Chandler 34 y.o. female returns to the clinic today for follow-up visit accompanied by her cousin.  The patient is feeling fine today with no concerning complaints she is more active.  She denied having any current chest pain, shortness of breath, cough or hemoptysis.  She denied having any nausea, vomiting, diarrhea or constipation.  She has no headache or visual changes.  The patient denied having any fever or chills.  She continues to tolerate her treatment with Alecensa fairly well.  She is here today for evaluation and repeat blood work.  MEDICAL HISTORY: Past Medical History:  Diagnosis Date  . Cancer ([place])   . Pneumonia 05/08/2019    ALLERGIES:  is allergic to iron.  MEDICATIONS:  Current Outpatient Medications  Medication Sig Dispense Refill  . acetaminophen (TYLENOL) 500 MG tablet Take 1,000 mg by mouth every 6 (six) hours as needed for moderate pain or headache.    . albuterol (VENTOLIN HFA) 108 (90 Base) MCG/ACT inhaler Inhale 2 puffs into the lungs every 6 (six) hours as needed for wheezing or shortness of breath. 8 g 1  . alectinib (ALECENSA) 150 MG capsule Take 4 capsules (600 mg total) by mouth 2 (two) times daily with a meal. This is for future refills -Today she received a supply from [facility] from a rx dated 6/9. With this  rx  she should have one more refill on that rx. She was out of drug for 3 days. We are trying to avoid it from happening again. If you have question scall [phone] 240 capsule 1  . guaifenesin (ROBITUSSIN) 100 MG/5ML syrup Take 200 mg by mouth 3 (three) times daily as needed for cough.    . hydrOXYzine (ATARAX/VISTARIL) 25 MG tablet Take 1 tablet (25 mg total) by mouth 3 (three) times daily as needed for anxiety. 30 tablet 0  . methimazole (TAPAZOLE) 5 MG tablet Take 0.5 tablets (2.5 mg total) by mouth daily. 30 tablet 3  . Multiple Vitamin (MULTIVITAMIN WITH MINERALS) TABS tablet Take 2 tablets by mouth daily. Gummies    . OVER THE COUNTER MEDICATION Take 2 each by mouth daily as needed (anxiety). CBD gummies     No current facility-administered medications for this visit.    SURGICAL HISTORY:  Past Surgical History:  Procedure Laterality Date  . BIOPSY  07/03/2019   Procedure: BIOPSY;  Surgeon: Rigoberto Noel, MD;  Location: [facility] ENDOSCOPY;  Service: Cardiopulmonary;;  . BRONCHIAL BRUSHINGS  07/03/2019   Procedure: BRONCHIAL BRUSHINGS;  Surgeon: Rigoberto Noel, MD;  Location: [facility] ENDOSCOPY;  Service: Cardiopulmonary;;  . BRONCHIAL WASHINGS  07/03/2019   Procedure: BRONCHIAL WASHINGS;  Surgeon: Rigoberto Noel, MD;  Location: [facility] ENDOSCOPY;  Service: Cardiopulmonary;;  . NO PAST SURGERIES    . TOOTH EXTRACTION    . VIDEO BRONCHOSCOPY N/A 07/03/2019   Procedure: VIDEO BRONCHOSCOPY WITH FLUORO;  Surgeon: Elsworth Soho,  Leanna Sato, MD;  Location: [facility];  Service: Cardiopulmonary;  Laterality: N/A;  LMA    REVIEW OF SYSTEMS:  A comprehensive review of systems was negative.   PHYSICAL EXAMINATION: General appearance: alert, cooperative and no distress Head: Normocephalic, without obvious abnormality, atraumatic Neck: no adenopathy, no JVD, supple, symmetrical, trachea midline and thyroid not enlarged, symmetric, no tenderness/mass/nodules Lymph nodes: Cervical, supraclavicular, and axillary nodes  normal. Resp: clear to auscultation bilaterally Back: symmetric, no curvature. ROM normal. No CVA tenderness. Cardio: regular rate and rhythm, S1, S2 normal, no murmur, click, rub or gallop GI: soft, non-tender; bowel sounds normal; no masses,  no organomegaly Extremities: extremities normal, atraumatic, no cyanosis or edema  ECOG PERFORMANCE STATUS: 0 - Asymptomatic  Blood pressure (!) 145/75, pulse 93, temperature 98.1 F (36.7 C), temperature source Temporal, resp. rate 18, height '5\' 10"'  (1.778 m), weight 236 lb 9.6 oz (107.3 kg), SpO2 97 %.  LABORATORY DATA: Lab Results  Component Value Date   WBC 9.1 10/19/2019   HGB 10.9 (L) 10/19/2019   HCT 32.8 (L) 10/19/2019   MCV 91.9 10/19/2019   PLT 277 10/19/2019      Chemistry      Component Value Date/Time   NA 140 09/14/2019 0921   NA 139 05/19/2019 1146   K 4.5 09/14/2019 0921   CL 106 09/14/2019 0921   CO2 25 09/14/2019 0921   BUN 12 09/14/2019 0921   BUN 5 (L) 05/19/2019 1146   CREATININE 0.84 09/14/2019 0921      Component Value Date/Time   CALCIUM 8.5 (L) 09/14/2019 0921   ALKPHOS 117 09/14/2019 0921   AST 39 09/14/2019 0921   ALT 39 09/14/2019 0921   BILITOT 0.9 09/14/2019 0921       RADIOGRAPHIC STUDIES: No results found.  ASSESSMENT AND PLAN: This is a very pleasant 34 years old white female recently diagnosed with stage IV non-small cell lung cancer, adenocarcinoma with positive ALK gene translocation in March 2021.  The patient started treatment with Alecensa on July 23, 2019 and within few days she started not significant improvement in her condition with less shortness of breath as well as decrease in supraclavicular lymphadenopathy.  She is status post 3 months of treatment. The patient continues to do well and tolerating her treatment with no significant adverse effects. I recommended for her to continue her current treatment with Alecensa. She will come back for follow-up visit in 1 months for  evaluation and repeat blood work. She was advised to call immediately if she has any concerning symptoms in the interval. The patient voices understanding of current disease status and treatment options and is in agreement with the current care plan.  All questions were answered. The patient knows to call the clinic with any problems, questions or concerns. We can certainly see the patient much sooner if necessary.  Disclaimer: This note was dictated with voice recognition software. Similar sounding words can inadvertently be transcribed and may not be corrected upon review.

## 2019-10-23 ENCOUNTER — Other Ambulatory Visit: Payer: Medicaid Other

## 2019-11-06 ENCOUNTER — Other Ambulatory Visit (INDEPENDENT_AMBULATORY_CARE_PROVIDER_SITE_OTHER): Payer: Medicaid Other

## 2019-11-06 ENCOUNTER — Other Ambulatory Visit: Payer: Self-pay | Admitting: Internal Medicine

## 2019-11-06 ENCOUNTER — Other Ambulatory Visit: Payer: Self-pay

## 2019-11-06 DIAGNOSIS — E559 Vitamin D deficiency, unspecified: Secondary | ICD-10-CM

## 2019-11-06 DIAGNOSIS — E0590 Thyrotoxicosis, unspecified without thyrotoxic crisis or storm: Secondary | ICD-10-CM | POA: Diagnosis not present

## 2019-11-06 LAB — T4, FREE: Free T4: 0.83 ng/dL (ref 0.60–1.60)

## 2019-11-06 LAB — VITAMIN D 25 HYDROXY (VIT D DEFICIENCY, FRACTURES): VITD: 16.53 ng/mL — ABNORMAL LOW (ref 30.00–100.00)

## 2019-11-06 LAB — TSH: TSH: 16.7 u[IU]/mL — ABNORMAL HIGH (ref 0.35–4.50)

## 2019-11-06 MED ORDER — ERGOCALCIFEROL 1.25 MG (50000 UT) PO CAPS: 50000.0000 [IU] | ORAL_CAPSULE | ORAL | 1 refills | Status: DC

## 2019-11-06 NOTE — Progress Notes (Unsigned)
vitamind

## 2019-11-19 ENCOUNTER — Inpatient Hospital Stay: Payer: Medicaid Other | Attending: Internal Medicine | Admitting: Internal Medicine

## 2019-11-19 ENCOUNTER — Inpatient Hospital Stay: Payer: Medicaid Other

## 2019-11-19 ENCOUNTER — Other Ambulatory Visit: Payer: Self-pay

## 2019-11-19 ENCOUNTER — Encounter: Payer: Self-pay | Admitting: Internal Medicine

## 2019-11-19 VITALS — BP 136/69 | HR 99 | Temp 98.1°F | Resp 18 | Ht 70.0 in | Wt 252.1 lb

## 2019-11-19 DIAGNOSIS — F419 Anxiety disorder, unspecified: Secondary | ICD-10-CM | POA: Diagnosis not present

## 2019-11-19 DIAGNOSIS — C3492 Malignant neoplasm of unspecified part of left bronchus or lung: Secondary | ICD-10-CM | POA: Diagnosis not present

## 2019-11-19 DIAGNOSIS — C3491 Malignant neoplasm of unspecified part of right bronchus or lung: Secondary | ICD-10-CM | POA: Insufficient documentation

## 2019-11-19 DIAGNOSIS — C3490 Malignant neoplasm of unspecified part of unspecified bronchus or lung: Secondary | ICD-10-CM

## 2019-11-19 DIAGNOSIS — Z5111 Encounter for antineoplastic chemotherapy: Secondary | ICD-10-CM | POA: Diagnosis not present

## 2019-11-19 DIAGNOSIS — C7931 Secondary malignant neoplasm of brain: Secondary | ICD-10-CM | POA: Insufficient documentation

## 2019-11-19 LAB — CBC WITH DIFFERENTIAL (CANCER CENTER ONLY)
Abs Immature Granulocytes: 0.02 10*3/uL (ref 0.00–0.07)
Basophils Absolute: 0.1 10*3/uL (ref 0.0–0.1)
Basophils Relative: 1 %
Eosinophils Absolute: 0.2 10*3/uL (ref 0.0–0.5)
Eosinophils Relative: 3 %
HCT: 33.7 % — ABNORMAL LOW (ref 36.0–46.0)
Hemoglobin: 11.1 g/dL — ABNORMAL LOW (ref 12.0–15.0)
Immature Granulocytes: 0 %
Lymphocytes Relative: 35 %
Lymphs Abs: 3 10*3/uL (ref 0.7–4.0)
MCH: 30.7 pg (ref 26.0–34.0)
MCHC: 32.9 g/dL (ref 30.0–36.0)
MCV: 93.4 fL (ref 80.0–100.0)
Monocytes Absolute: 0.8 10*3/uL (ref 0.1–1.0)
Monocytes Relative: 9 %
Neutro Abs: 4.6 10*3/uL (ref 1.7–7.7)
Neutrophils Relative %: 52 %
Platelet Count: 267 10*3/uL (ref 150–400)
RBC: 3.61 MIL/uL — ABNORMAL LOW (ref 3.87–5.11)
RDW: 14.2 % (ref 11.5–15.5)
WBC Count: 8.8 10*3/uL (ref 4.0–10.5)
nRBC: 0 % (ref 0.0–0.2)

## 2019-11-19 LAB — CMP (CANCER CENTER ONLY)
ALT: 21 U/L (ref 0–44)
AST: 24 U/L (ref 15–41)
Albumin: 3.9 g/dL (ref 3.5–5.0)
Alkaline Phosphatase: 124 U/L (ref 38–126)
Anion gap: 10 (ref 5–15)
BUN: 10 mg/dL (ref 6–20)
CO2: 22 mmol/L (ref 22–32)
Calcium: 9.4 mg/dL (ref 8.9–10.3)
Chloride: 109 mmol/L (ref 98–111)
Creatinine: 0.82 mg/dL (ref 0.44–1.00)
GFR, Est AFR Am: >60 mL/min (ref >60)
GFR, Estimated: >60 mL/min (ref >60)
Glucose, Bld: 83 mg/dL (ref 70–99)
Potassium: 4.5 mmol/L (ref 3.5–5.1)
Sodium: 141 mmol/L (ref 135–145)
Total Bilirubin: 1.4 mg/dL — ABNORMAL HIGH (ref 0.3–1.2)
Total Protein: 7.5 g/dL (ref 6.5–8.1)

## 2019-11-19 LAB — CK: Total CK: 128 U/L (ref 38–234)

## 2019-11-19 NOTE — Progress Notes (Signed)
[place] [phone]   Fax:(336) [phone]  OFFICE PROGRESS NOTE  [name], PA-C [place] [address]  DIAGNOSIS: Stage IV (T3, N2, M1c) non-small cell lung cancer, adenocarcinoma with ALK gene translocation presented with multiple bilateral pulmonary nodules and masses as well as small mediastinal and bilateral axillary lymphadenopathy and metastatic disease to the brain diagnosed in March 2021.  PRIOR THERAPY: None  CURRENT THERAPY: Alecensa (Alectinib) 600 mg p.o. twice daily.  First dose started on July 23 2019.  Status post 4 months of treatment.  INTERVAL HISTORY: Sarah Chandler 34 y.o. female returns to the clinic today for follow-up visit accompanied by her cousin.  The patient is feeling fine today with no concerning complaints but she is currently under a lot of stress dealing with her parents with medical issues and also concern about possibility of her disease recurrence.  The patient denied having any current chest pain, shortness of breath, cough or hemoptysis.  She denied having any nausea, vomiting, diarrhea or constipation.  She has no headache or visual changes.  She gained several pounds since her last visit.  She is here today for evaluation and repeat blood work.  MEDICAL HISTORY: Past Medical History:  Diagnosis Date  . Cancer ([place])   . Pneumonia 05/08/2019    ALLERGIES:  is allergic to iron.  MEDICATIONS:  Current Outpatient Medications  Medication Sig Dispense Refill  . acetaminophen (TYLENOL) 500 MG tablet Take 1,000 mg by mouth every 6 (six) hours as needed for moderate pain or headache.    . albuterol (VENTOLIN HFA) 108 (90 Base) MCG/ACT inhaler Inhale 2 puffs into the lungs every 6 (six) hours as needed for wheezing or shortness of breath. 8 g 1  . alectinib (ALECENSA) 150 MG capsule Take 4 capsules (600 mg total) by mouth 2 (two) times daily with a meal. This is for future refills  -Today she received a supply from [facility] from a rx dated 6/9. With this rx  she should have one more refill on that rx. She was out of drug for 3 days. We are trying to avoid it from happening again. If you have question scall [phone] 240 capsule 1  . ergocalciferol (VITAMIN D2) 1.25 MG (50000 UT) capsule Take 1 capsule (50,000 Units total) by mouth once a week. 13 capsule 1  . guaifenesin (ROBITUSSIN) 100 MG/5ML syrup Take 200 mg by mouth 3 (three) times daily as needed for cough.    . hydrOXYzine (ATARAX/VISTARIL) 25 MG tablet Take 1 tablet (25 mg total) by mouth 3 (three) times daily as needed for anxiety. 30 tablet 0  . Multiple Vitamin (MULTIVITAMIN WITH MINERALS) TABS tablet Take 2 tablets by mouth daily. Gummies    . OVER THE COUNTER MEDICATION Take 2 each by mouth daily as needed (anxiety). CBD gummies     No current facility-administered medications for this visit.    SURGICAL HISTORY:  Past Surgical History:  Procedure Laterality Date  . BIOPSY  07/03/2019   Procedure: BIOPSY;  Surgeon: Rigoberto Noel, MD;  Location: [facility] ENDOSCOPY;  Service: Cardiopulmonary;;  . BRONCHIAL BRUSHINGS  07/03/2019   Procedure: BRONCHIAL BRUSHINGS;  Surgeon: Rigoberto Noel, MD;  Location: [facility] ENDOSCOPY;  Service: Cardiopulmonary;;  . BRONCHIAL WASHINGS  07/03/2019   Procedure: BRONCHIAL WASHINGS;  Surgeon: Rigoberto Noel, MD;  Location: [facility] ENDOSCOPY;  Service: Cardiopulmonary;;  . NO PAST SURGERIES    . TOOTH EXTRACTION    .  VIDEO BRONCHOSCOPY N/A 07/03/2019   Procedure: VIDEO BRONCHOSCOPY WITH FLUORO;  Surgeon: Rigoberto Noel, MD;  Location: [place];  Service: Cardiopulmonary;  Laterality: N/A;  LMA    REVIEW OF SYSTEMS:  A comprehensive review of systems was negative except for: Behavioral/Psych: positive for anxiety   PHYSICAL EXAMINATION: General appearance: alert, cooperative and no distress Head: Normocephalic, without obvious abnormality, atraumatic Neck: no adenopathy, no JVD, supple,  symmetrical, trachea midline and thyroid not enlarged, symmetric, no tenderness/mass/nodules Lymph nodes: Cervical, supraclavicular, and axillary nodes normal. Resp: clear to auscultation bilaterally Back: symmetric, no curvature. ROM normal. No CVA tenderness. Cardio: regular rate and rhythm, S1, S2 normal, no murmur, click, rub or gallop GI: soft, non-tender; bowel sounds normal; no masses,  no organomegaly Extremities: extremities normal, atraumatic, no cyanosis or edema  ECOG PERFORMANCE STATUS: 1 - Symptomatic but completely ambulatory  Blood pressure 136/69, pulse 99, temperature 98.1 F (36.7 C), temperature source Tympanic, resp. rate 18, height '5\' 10"'  (1.778 m), weight 252 lb 1.6 oz (114.4 kg), SpO2 98 %.  LABORATORY DATA: Lab Results  Component Value Date   WBC 9.1 10/19/2019   HGB 10.9 (L) 10/19/2019   HCT 32.8 (L) 10/19/2019   MCV 91.9 10/19/2019   PLT 277 10/19/2019      Chemistry      Component Value Date/Time   NA 140 10/19/2019 1432   NA 139 05/19/2019 1146   K 3.9 10/19/2019 1432   CL 108 10/19/2019 1432   CO2 22 10/19/2019 1432   BUN 12 10/19/2019 1432   BUN 5 (L) 05/19/2019 1146   CREATININE 0.76 10/19/2019 1432      Component Value Date/Time   CALCIUM 8.8 (L) 10/19/2019 1432   ALKPHOS 112 10/19/2019 1432   AST 19 10/19/2019 1432   ALT 18 10/19/2019 1432   BILITOT 1.1 10/19/2019 1432       RADIOGRAPHIC STUDIES: No results found.  ASSESSMENT AND PLAN: This is a very pleasant 34 years old white female recently diagnosed with stage IV non-small cell lung cancer, adenocarcinoma with positive ALK gene translocation in March 2021.  The patient started treatment with Alecensa on July 23, 2019 and within few days she started not significant improvement in her condition with less shortness of breath as well as decrease in supraclavicular lymphadenopathy.  She is status post 4 months of treatment. The patient continues to tolerate her treatment with Alecensa  fairly well. She will have repeat CBC and comprehensive metabolic panel today. I will see her back for follow-up visit in 1 months for evaluation with repeat CT scan of the chest, abdomen pelvis for restaging of her disease. For the stress issues and anxiety, I will refer her to the social worker for evaluation and help with her social situation. She was advised to call immediately if she has any concerning symptoms in the interval. The patient voices understanding of current disease status and treatment options and is in agreement with the current care plan.  All questions were answered. The patient knows to call the clinic with any problems, questions or concerns. We can certainly see the patient much sooner if necessary.  Disclaimer: This note was dictated with voice recognition software. Similar sounding words can inadvertently be transcribed and may not be corrected upon review.

## 2019-11-23 ENCOUNTER — Telehealth: Payer: Self-pay | Admitting: Medical Oncology

## 2019-11-23 ENCOUNTER — Other Ambulatory Visit: Payer: Self-pay | Admitting: Medical Oncology

## 2019-11-23 DIAGNOSIS — C3490 Malignant neoplasm of unspecified part of unspecified bronchus or lung: Secondary | ICD-10-CM

## 2019-11-23 MED ORDER — ALECTINIB HCL 150 MG PO CAPS: 600.0000 mg | ORAL_CAPSULE | Freq: Two times a day (BID) | ORAL | 5 refills | Status: DC

## 2019-11-23 NOTE — Telephone Encounter (Signed)
Faxed Alecensa to [facility] with received receipt.

## 2019-11-23 NOTE — Telephone Encounter (Signed)
Per Dr Julien Nordmann , I told pt mother that Sarah Chandler can get the COVID booster.

## 2019-11-23 NOTE — Telephone Encounter (Signed)
Requests refill for Alecensa ,   COVID Booster? Can she get it now? Appts-f/u lab /ct and provider requested.  CT and labs expected 9/7-and [facility] a few days later.

## 2019-11-24 ENCOUNTER — Telehealth: Payer: Self-pay | Admitting: Internal Medicine

## 2019-11-24 ENCOUNTER — Encounter: Payer: Self-pay | Admitting: *Deleted

## 2019-11-24 NOTE — Telephone Encounter (Signed)
Scheduled appointments per 8/16 scheduling message. Spoke with patient's mother who is aware of appointments dates and times.

## 2019-11-24 NOTE — Progress Notes (Signed)
[place] Work  Clinical Social Work received referral from medical oncology for support.  CSW contacted patient at home to offer support and assess for needs.  Patient stated she was experiencing increased stress and anxiety due to medical issues and multiple responsibilities.  Patient expressed interest in counseling options.  CSW and patient discussed counseling options in the community and [facility].  Patient stated she would like to start with exploring options in the community.  CSW emailed patient a list of counseling providers in the community; as well as a list formulated from her insurance company as Museum/gallery exhibitions officer.  CSW provided contact information and encouraged patient to call with questions or concerns.  Johnnye Lana, MSW, LCSW, OSW-C Clinical Social Worker [facility] [phone]

## 2019-12-16 ENCOUNTER — Inpatient Hospital Stay: Payer: Medicaid Other | Attending: Internal Medicine

## 2019-12-16 ENCOUNTER — Ambulatory Visit (HOSPITAL_COMMUNITY)
Admission: RE | Admit: 2019-12-16 | Discharge: 2019-12-16 | Disposition: A | Discharge status: Home / Self Care | Payer: Medicaid Other | Source: Ambulatory Visit | Attending: Internal Medicine | Admitting: Internal Medicine

## 2019-12-16 ENCOUNTER — Encounter (HOSPITAL_COMMUNITY): Payer: Self-pay

## 2019-12-16 ENCOUNTER — Other Ambulatory Visit: Payer: Self-pay

## 2019-12-16 DIAGNOSIS — C3492 Malignant neoplasm of unspecified part of left bronchus or lung: Secondary | ICD-10-CM | POA: Diagnosis not present

## 2019-12-16 DIAGNOSIS — C7951 Secondary malignant neoplasm of bone: Secondary | ICD-10-CM | POA: Diagnosis not present

## 2019-12-16 DIAGNOSIS — C3490 Malignant neoplasm of unspecified part of unspecified bronchus or lung: Secondary | ICD-10-CM

## 2019-12-16 DIAGNOSIS — C3491 Malignant neoplasm of unspecified part of right bronchus or lung: Secondary | ICD-10-CM | POA: Insufficient documentation

## 2019-12-16 DIAGNOSIS — C7931 Secondary malignant neoplasm of brain: Secondary | ICD-10-CM | POA: Diagnosis present

## 2019-12-16 LAB — CMP (CANCER CENTER ONLY)
ALT: 18 U/L (ref 0–44)
AST: 19 U/L (ref 15–41)
Albumin: 3.8 g/dL (ref 3.5–5.0)
Alkaline Phosphatase: 128 U/L — ABNORMAL HIGH (ref 38–126)
Anion gap: 9 (ref 5–15)
BUN: 17 mg/dL (ref 6–20)
CO2: 22 mmol/L (ref 22–32)
Calcium: 9 mg/dL (ref 8.9–10.3)
Chloride: 110 mmol/L (ref 98–111)
Creatinine: 0.77 mg/dL (ref 0.44–1.00)
GFR, Est AFR Am: >60 mL/min (ref >60)
GFR, Estimated: >60 mL/min (ref >60)
Glucose, Bld: 84 mg/dL (ref 70–99)
Potassium: 4 mmol/L (ref 3.5–5.1)
Sodium: 141 mmol/L (ref 135–145)
Total Bilirubin: 1.3 mg/dL — ABNORMAL HIGH (ref 0.3–1.2)
Total Protein: 7.2 g/dL (ref 6.5–8.1)

## 2019-12-16 LAB — CBC WITH DIFFERENTIAL (CANCER CENTER ONLY)
Abs Immature Granulocytes: 0.02 10*3/uL (ref 0.00–0.07)
Basophils Absolute: 0.1 10*3/uL (ref 0.0–0.1)
Basophils Relative: 1 %
Eosinophils Absolute: 0.2 10*3/uL (ref 0.0–0.5)
Eosinophils Relative: 3 %
HCT: 33.8 % — ABNORMAL LOW (ref 36.0–46.0)
Hemoglobin: 11.3 g/dL — ABNORMAL LOW (ref 12.0–15.0)
Immature Granulocytes: 0 %
Lymphocytes Relative: 39 %
Lymphs Abs: 3.1 10*3/uL (ref 0.7–4.0)
MCH: 30.9 pg (ref 26.0–34.0)
MCHC: 33.4 g/dL (ref 30.0–36.0)
MCV: 92.3 fL (ref 80.0–100.0)
Monocytes Absolute: 0.7 10*3/uL (ref 0.1–1.0)
Monocytes Relative: 9 %
Neutro Abs: 3.8 10*3/uL (ref 1.7–7.7)
Neutrophils Relative %: 48 %
Platelet Count: 254 10*3/uL (ref 150–400)
RBC: 3.66 MIL/uL — ABNORMAL LOW (ref 3.87–5.11)
RDW: 14.2 % (ref 11.5–15.5)
WBC Count: 7.9 10*3/uL (ref 4.0–10.5)
nRBC: 0 % (ref 0.0–0.2)

## 2019-12-16 MED ORDER — IOHEXOL 300 MG/ML  SOLN
100.0000 mL | Freq: Once | INTRAMUSCULAR | Status: AC | PRN
  Administered 2019-12-16: 100 mL via INTRAVENOUS

## 2019-12-16 MED ORDER — IOHEXOL 9 MG/ML PO SOLN
500.0000 mL | ORAL | Status: AC
  Administered 2019-12-16: 1000 mL via ORAL

## 2019-12-16 MED ORDER — IOHEXOL 9 MG/ML PO SOLN
ORAL | Status: AC
  Filled 2019-12-16: qty 1000

## 2019-12-17 ENCOUNTER — Encounter: Payer: Self-pay | Admitting: Internal Medicine

## 2019-12-17 ENCOUNTER — Inpatient Hospital Stay (HOSPITAL_BASED_OUTPATIENT_CLINIC_OR_DEPARTMENT_OTHER): Payer: Medicaid Other | Admitting: Internal Medicine

## 2019-12-17 ENCOUNTER — Telehealth: Payer: Self-pay | Admitting: Internal Medicine

## 2019-12-17 ENCOUNTER — Telehealth: Payer: Self-pay | Admitting: Physician Assistant

## 2019-12-17 ENCOUNTER — Other Ambulatory Visit: Payer: Self-pay

## 2019-12-17 VITALS — BP 138/71 | HR 108 | Temp 98.6°F | Resp 18 | Ht 70.0 in | Wt 261.8 lb

## 2019-12-17 DIAGNOSIS — C3490 Malignant neoplasm of unspecified part of unspecified bronchus or lung: Secondary | ICD-10-CM

## 2019-12-17 DIAGNOSIS — C3491 Malignant neoplasm of unspecified part of right bronchus or lung: Secondary | ICD-10-CM | POA: Diagnosis not present

## 2019-12-17 DIAGNOSIS — Z5111 Encounter for antineoplastic chemotherapy: Secondary | ICD-10-CM

## 2019-12-17 MED ORDER — HYDROXYZINE HCL 25 MG PO TABS: 25.0000 mg | ORAL_TABLET | Freq: Three times a day (TID) | ORAL | 0 refills | Status: DC | PRN

## 2019-12-17 NOTE — Telephone Encounter (Signed)
Patient has not been seen in our office since 2/21 and was advised them to follow up in 2 months.   Patient declines to schedule apt at this time. Stating she has too much going on.   Patient is aware I am only sending in #30 tabs and she needs to schedule apt for further refills. AS, CMA

## 2019-12-17 NOTE — Telephone Encounter (Signed)
Patient is requesting a refill of her hydroxyzine, if approved please send to [facility] on [address].

## 2019-12-17 NOTE — Progress Notes (Signed)
[place] [phone]   [phone]  OFFICE PROGRESS NOTE  [name], PA-C [place] [address]  DIAGNOSIS: Stage IV (T3, N2, M1c) non-small cell lung cancer, adenocarcinoma with ALK gene translocation presented with multiple bilateral pulmonary nodules and masses as well as small mediastinal and bilateral axillary lymphadenopathy and metastatic disease to the brain diagnosed in March 2021.  PRIOR THERAPY: None  CURRENT THERAPY: Alecensa (Alectinib) 600 mg p.o. twice daily.  First dose started on July 23 2019.  Status post 5 months of treatment.  INTERVAL HISTORY: Sarah Chandler 34 y.o. female returns to the clinic today for follow-up visit accompanied by her mother. The patient is feeling fine today with no concerning complaints except for fatigue. She denied having any chest pain, shortness of breath, cough or hemoptysis. She denied having any weight loss or night sweats. She has no nausea, vomiting, diarrhea or constipation. She denied having any headache or visual changes. She continues to tolerate her treatment with Alecensa fairly well. The patient had repeat CT scan of the chest, abdomen pelvis performed recently and she is here today for evaluation and discussion of her scan results.  MEDICAL HISTORY: Past Medical History:  Diagnosis Date   nscl ca dx'd 06/2019   Pneumonia 05/08/2019    ALLERGIES:  is allergic to iron.  MEDICATIONS:  Current Outpatient Medications  Medication Sig Dispense Refill   acetaminophen (TYLENOL) 500 MG tablet Take 1,000 mg by mouth every 6 (six) hours as needed for moderate pain or headache.     albuterol (VENTOLIN HFA) 108 (90 Base) MCG/ACT inhaler Inhale 2 puffs into the lungs every 6 (six) hours as needed for wheezing or shortness of breath. 8 g 1   alectinib (ALECENSA) 150 MG capsule Take 4 capsules (600 mg total) by mouth 2 (two) times daily with a meal. 240 capsule  5   ergocalciferol (VITAMIN D2) 1.25 MG (50000 UT) capsule Take 1 capsule (50,000 Units total) by mouth once a week. 13 capsule 1   guaifenesin (ROBITUSSIN) 100 MG/5ML syrup Take 200 mg by mouth 3 (three) times daily as needed for cough.     hydrOXYzine (ATARAX/VISTARIL) 25 MG tablet Take 1 tablet (25 mg total) by mouth 3 (three) times daily as needed for anxiety. 30 tablet 0   Multiple Vitamin (MULTIVITAMIN WITH MINERALS) TABS tablet Take 2 tablets by mouth daily. Gummies     OVER THE COUNTER MEDICATION Take 2 each by mouth daily as needed (anxiety). CBD gummies     No current facility-administered medications for this visit.    SURGICAL HISTORY:  Past Surgical History:  Procedure Laterality Date   BIOPSY  07/03/2019   Procedure: BIOPSY;  Surgeon: Rigoberto Noel, MD;  Location: [facility] ENDOSCOPY;  Service: Cardiopulmonary;;   BRONCHIAL BRUSHINGS  07/03/2019   Procedure: BRONCHIAL BRUSHINGS;  Surgeon: Rigoberto Noel, MD;  Location: [place];  Service: Cardiopulmonary;;   BRONCHIAL WASHINGS  07/03/2019   Procedure: BRONCHIAL WASHINGS;  Surgeon: Rigoberto Noel, MD;  Location: [place];  Service: Cardiopulmonary;;   NO PAST SURGERIES     TOOTH EXTRACTION     VIDEO BRONCHOSCOPY N/A 07/03/2019   Procedure: VIDEO BRONCHOSCOPY WITH FLUORO;  Surgeon: Rigoberto Noel, MD;  Location: [place];  Service: Cardiopulmonary;  Laterality: N/A;  LMA    REVIEW OF SYSTEMS:  Constitutional: positive for fatigue Eyes: negative Ears, nose, mouth, throat, and face: negative Respiratory: negative Cardiovascular: negative  Gastrointestinal: negative Genitourinary:negative Integument/breast: negative Hematologic/lymphatic: negative Musculoskeletal:negative Neurological: negative Behavioral/Psych: negative Endocrine: negative Allergic/Immunologic: negative   PHYSICAL EXAMINATION: General appearance: alert, cooperative and no distress Head: Normocephalic, without obvious abnormality,  atraumatic Neck: no adenopathy, no JVD, supple, symmetrical, trachea midline and thyroid not enlarged, symmetric, no tenderness/mass/nodules Lymph nodes: Cervical, supraclavicular, and axillary nodes normal. Resp: clear to auscultation bilaterally Back: symmetric, no curvature. ROM normal. No CVA tenderness. Cardio: regular rate and rhythm, S1, S2 normal, no murmur, click, rub or gallop GI: soft, non-tender; bowel sounds normal; no masses,  no organomegaly Extremities: extremities normal, atraumatic, no cyanosis or edema Neurologic: Alert and oriented X 3, normal strength and tone. Normal symmetric reflexes. Normal coordination and gait  ECOG PERFORMANCE STATUS: 1 - Symptomatic but completely ambulatory  Blood pressure 138/71, pulse (!) 108, temperature 98.6 F (37 C), temperature source Tympanic, resp. rate 18, height _0  (1.778 m), weight 261 lb 12.8 oz (118.8 kg), last menstrual period 11/28/2019, SpO2 99 %.  LABORATORY DATA: Lab Results  Component Value Date   WBC 7.9 12/16/2019   HGB 11.3 (L) 12/16/2019   HCT 33.8 (L) 12/16/2019   MCV 92.3 12/16/2019   PLT 254 12/16/2019      Chemistry      Component Value Date/Time   NA 141 12/16/2019 0816   NA 139 05/19/2019 1146   K 4.0 12/16/2019 0816   CL 110 12/16/2019 0816   CO2 22 12/16/2019 0816   BUN 17 12/16/2019 0816   BUN 5 (L) 05/19/2019 1146   CREATININE 0.77 12/16/2019 0816      Component Value Date/Time   CALCIUM 9.0 12/16/2019 0816   ALKPHOS 128 (H) 12/16/2019 0816   AST 19 12/16/2019 0816   ALT 18 12/16/2019 0816   BILITOT 1.3 (H) 12/16/2019 0816       RADIOGRAPHIC STUDIES: CT Chest W Contrast  Result Date: 12/16/2019 CLINICAL DATA:  Primary Cancer Type: Lung Imaging Indication: Routine surveillance Interval therapy since last imaging? Yes Initial Cancer Diagnosis Date: 07/03/2019; Established by: Biopsy-proven Detailed Pathology: Stage IV non-small cell lung cancer, poorly differentiated adenocarcinoma with  signet ring features. Primary Tumor location: Multiple bilateral pulmonary nodules and masses. Metastatic disease to the brain. Surgeries: None. Chemotherapy: No Immunotherapy? No Radiation therapy? No Other Cancer Therapies: Alecensa, started 07/23/2019. EXAM: CT CHEST, ABDOMEN, AND PELVIS WITH CONTRAST TECHNIQUE: Multidetector CT imaging of the chest, abdomen and pelvis was performed following the standard protocol during bolus administration of intravenous contrast. CONTRAST:  170m OMNIPAQUE IOHEXOL 300 MG/ML  SOLN COMPARISON:  Most recent CT chest 09/11/2019.  08/05/2019 PET-CT. FINDINGS: CT CHEST FINDINGS Cardiovascular: Heart size is normal. There is no significant pericardial fluid, thickening or pericardial calcification. No atherosclerotic calcifications in the thoracic aorta or the coronary arteries. Mediastinum/Nodes: No pathologically enlarged mediastinal or hilar lymph nodes. Esophagus is unremarkable in appearance. No axillary lymphadenopathy. Lungs/Pleura: Widespread areas of nodular septal thickening and fissural thickening in the lungs bilaterally, overall improved compared to the prior examination, favored to reflect resolving lymphangitic spread of tumor. Some patchy ground-glass attenuation is also noted. No new dominant suspicious appearing pulmonary nodule or mass noted. No confluent consolidative airspace disease. No pleural effusions. Musculoskeletal: Innumerable predominantly sclerotic lesions noted throughout the visualized axial and appendicular skeleton, similar to the prior study. CT ABDOMEN PELVIS FINDINGS Hepatobiliary: No suspicious cystic or solid hepatic lesions. No intra or extrahepatic biliary ductal dilatation. Gallbladder is normal in appearance. Pancreas: No pancreatic mass. No pancreatic ductal dilatation. No pancreatic or peripancreatic fluid collections  or inflammatory changes. Spleen: Calcified granuloma in the spleen.  Otherwise, unremarkable. Adrenals/Urinary Tract:  Bilateral kidneys and adrenal glands are normal in appearance. No hydroureteronephrosis. Urinary bladder is normal in appearance. Stomach/Bowel: Normal appearance of the stomach. No pathologic dilatation of small bowel or colon. Normal appendix. Vascular/Lymphatic: No significant atherosclerotic disease, aneurysm or dissection noted in the abdominal or pelvic vasculature. No lymphadenopathy noted in the abdomen or pelvis. Reproductive: Uterus and ovaries are unremarkable in appearance. Other: No significant volume of ascites.  No pneumoperitoneum. Musculoskeletal: Numerous predominantly sclerotic lesions are again noted throughout the visualized axial and appendicular skeleton. IMPRESSION: 1. Today's study demonstrates what appears to be regression of widespread lymphangitic spread of tumor in the lungs bilaterally, indicating a positive response to therapy. 2. No mediastinal or hilar lymphadenopathy. 3. Widespread metastatic disease to the bones appears unchanged compared to prior examinations. 4. No new extraskeletal metastatic disease noted in the abdomen or pelvis. Electronically Signed   By: Vinnie Langton M.D.   On: 12/16/2019 11:03   CT Abdomen Pelvis W Contrast  Result Date: 12/16/2019 CLINICAL DATA:  Primary Cancer Type: Lung Imaging Indication: Routine surveillance Interval therapy since last imaging? Yes Initial Cancer Diagnosis Date: 07/03/2019; Established by: Biopsy-proven Detailed Pathology: Stage IV non-small cell lung cancer, poorly differentiated adenocarcinoma with signet ring features. Primary Tumor location: Multiple bilateral pulmonary nodules and masses. Metastatic disease to the brain. Surgeries: None. Chemotherapy: No Immunotherapy? No Radiation therapy? No Other Cancer Therapies: Alecensa, started 07/23/2019. EXAM: CT CHEST, ABDOMEN, AND PELVIS WITH CONTRAST TECHNIQUE: Multidetector CT imaging of the chest, abdomen and pelvis was performed following the standard protocol during bolus  administration of intravenous contrast. CONTRAST:  167m OMNIPAQUE IOHEXOL 300 MG/ML  SOLN COMPARISON:  Most recent CT chest 09/11/2019.  08/05/2019 PET-CT. FINDINGS: CT CHEST FINDINGS Cardiovascular: Heart size is normal. There is no significant pericardial fluid, thickening or pericardial calcification. No atherosclerotic calcifications in the thoracic aorta or the coronary arteries. Mediastinum/Nodes: No pathologically enlarged mediastinal or hilar lymph nodes. Esophagus is unremarkable in appearance. No axillary lymphadenopathy. Lungs/Pleura: Widespread areas of nodular septal thickening and fissural thickening in the lungs bilaterally, overall improved compared to the prior examination, favored to reflect resolving lymphangitic spread of tumor. Some patchy ground-glass attenuation is also noted. No new dominant suspicious appearing pulmonary nodule or mass noted. No confluent consolidative airspace disease. No pleural effusions. Musculoskeletal: Innumerable predominantly sclerotic lesions noted throughout the visualized axial and appendicular skeleton, similar to the prior study. CT ABDOMEN PELVIS FINDINGS Hepatobiliary: No suspicious cystic or solid hepatic lesions. No intra or extrahepatic biliary ductal dilatation. Gallbladder is normal in appearance. Pancreas: No pancreatic mass. No pancreatic ductal dilatation. No pancreatic or peripancreatic fluid collections or inflammatory changes. Spleen: Calcified granuloma in the spleen.  Otherwise, unremarkable. Adrenals/Urinary Tract: Bilateral kidneys and adrenal glands are normal in appearance. No hydroureteronephrosis. Urinary bladder is normal in appearance. Stomach/Bowel: Normal appearance of the stomach. No pathologic dilatation of small bowel or colon. Normal appendix. Vascular/Lymphatic: No significant atherosclerotic disease, aneurysm or dissection noted in the abdominal or pelvic vasculature. No lymphadenopathy noted in the abdomen or pelvis.  Reproductive: Uterus and ovaries are unremarkable in appearance. Other: No significant volume of ascites.  No pneumoperitoneum. Musculoskeletal: Numerous predominantly sclerotic lesions are again noted throughout the visualized axial and appendicular skeleton. IMPRESSION: 1. Today's study demonstrates what appears to be regression of widespread lymphangitic spread of tumor in the lungs bilaterally, indicating a positive response to therapy. 2. No mediastinal or hilar lymphadenopathy. 3. Widespread metastatic disease  to the bones appears unchanged compared to prior examinations. 4. No new extraskeletal metastatic disease noted in the abdomen or pelvis. Electronically Signed   By: Vinnie Langton M.D.   On: 12/16/2019 11:03    ASSESSMENT AND PLAN: This is a very pleasant 34 years old white female recently diagnosed with stage IV non-small cell lung cancer, adenocarcinoma with positive ALK gene translocation in March 2021.  The patient started treatment with Alecensa on July 23, 2019 and within few days she started not significant improvement in her condition with less shortness of breath as well as decrease in supraclavicular lymphadenopathy.  She is status post 5 months of treatment. The patient continues to tolerate her treatment with Alecensa fairly well with no concerning complaints. She had repeat CT scan of the chest, abdomen pelvis performed recently. I personally and independently reviewed the scan images and discussed the result and showed the images to the patient and her mother. Her scan showed continuous improvement of her disease with no concerning findings for disease progression. I recommended for the patient to continue her current treatment with Alecensa with the same dose. I will see her back for follow-up visit in 6 weeks for evaluation and repeat blood work. The patient was advised to call immediately if she has any concerning symptoms in the interval. The patient voices understanding  of current disease status and treatment options and is in agreement with the current care plan.  All questions were answered. The patient knows to call the clinic with any problems, questions or concerns. We can certainly see the patient much sooner if necessary. The total time spent in the appointment was 30 minutes. Disclaimer: This note was dictated with voice recognition software. Similar sounding words can inadvertently be transcribed and may not be corrected upon review.

## 2019-12-17 NOTE — Telephone Encounter (Signed)
Scheduled appointments per 9/9 los. Gave patient calendar print out.

## 2019-12-17 NOTE — Addendum Note (Signed)
Addended by: Mickel Crow on: 12/17/2019 01:24 PM   Modules accepted: Orders

## 2019-12-17 NOTE — Progress Notes (Signed)
Contacted pt PCP and requested refill for hydroxyzine.

## 2019-12-25 ENCOUNTER — Other Ambulatory Visit: Payer: Self-pay

## 2019-12-25 ENCOUNTER — Ambulatory Visit (INDEPENDENT_AMBULATORY_CARE_PROVIDER_SITE_OTHER): Payer: Medicaid Other | Admitting: Internal Medicine

## 2019-12-25 ENCOUNTER — Encounter: Payer: Self-pay | Admitting: Internal Medicine

## 2019-12-25 VITALS — BP 122/60 | HR 93 | Ht 70.0 in | Wt 265.4 lb

## 2019-12-25 DIAGNOSIS — E0500 Thyrotoxicosis with diffuse goiter without thyrotoxic crisis or storm: Secondary | ICD-10-CM | POA: Diagnosis not present

## 2019-12-25 DIAGNOSIS — E559 Vitamin D deficiency, unspecified: Secondary | ICD-10-CM

## 2019-12-25 NOTE — Progress Notes (Signed)
Name: Sarah Chandler  MRN/ DOB: [id number], Nov 24, 1985    Age/ Sex: 34 y.o., female     PCP: Sarah Reid, PA-C   Reason for Endocrinology Evaluation: Hyperthyroidism     Initial Endocrinology Clinic Visit: 06/08/2019    PATIENT IDENTIFIER: 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 She has followed with [facility] since 06/08/2019 for consultative assistance with management of her hyperthyroidism.   HISTORICAL SUMMARY:  Pt presented to the ED on 05/10/2019 with palpitations and fatigue. She was noted to have a suppressed TSH < 0.005 uIU/mL with elevated FT4 at 1.70 ng/dL .She was started on Methimazole at the time.   In the meantime she was diagnosed with metastatic lung ca and was on Alectinib. TSH trended up to 91 uIU/mL by 09/2019 and methimazole was stopped.     No FH of thyroid disease SUBJECTIVE:   Today (12/25/2019):  Sarah Chandler is here for a follow up on hyperthyroidism.  She has been diagnosed with metastatic lung Ca and currently on Alectinib started 07/2019    She has been off methimazole since 10/2019   Weight has been stable  She has been stressed and anxious  Arthralgias are improving   Denies constipation  Denies local neck symptoms    HISTORY:  Past Medical History:  Past Medical History:  Diagnosis Date  . nscl ca dx'd 06/2019  . Pneumonia 05/08/2019   Past Surgical History:  Past Surgical History:  Procedure Laterality Date  . BIOPSY  07/03/2019   Procedure: BIOPSY;  Surgeon: Rigoberto Noel, MD;  [facility] ENDOSCOPY;  Service: Cardiopulmonary;;  . BRONCHIAL BRUSHINGS  07/03/2019   Procedure: BRONCHIAL BRUSHINGS;  Surgeon: Rigoberto Noel, MD;  Location: [facility] ENDOSCOPY;  Service: Cardiopulmonary;;  . BRONCHIAL WASHINGS  07/03/2019   Procedure: BRONCHIAL WASHINGS;  Surgeon: Rigoberto Noel, MD;  Location: [facility] ENDOSCOPY;  Service:  Cardiopulmonary;;  . NO PAST SURGERIES    . TOOTH EXTRACTION    . VIDEO BRONCHOSCOPY N/A 07/03/2019   Procedure: VIDEO BRONCHOSCOPY WITH FLUORO;  Surgeon: Rigoberto Noel, MD;  Location: [place];  Service: Cardiopulmonary;  Laterality: N/A;  LMA    Social History:  reports that she has never smoked. She has never used smokeless tobacco. She reports that she does not drink alcohol and does not use drugs. Family History:  Family History  Problem Relation Age of Onset  . Diabetes Father   . Hyperlipidemia Father   . Hypertension Father   . Depression Maternal Uncle   . Diabetes Maternal Uncle   . Cancer Maternal Grandmother   . Hyperlipidemia Maternal Grandmother   . Cancer Paternal Grandfather      HOME MEDICATIONS: Allergies as of 12/25/2019      Reactions   Iron Other (See Comments)   Ferrosol - Liquid  Unknown reaction       Medication List       Accurate as of December 25, 2019  9:03 AM. If you have any questions, ask your nurse or doctor.        STOP taking these medications   OVER THE COUNTER MEDICATION Stopped by: Sarah Sciara, MD     TAKE these medications   acetaminophen 500 MG tablet Commonly known as: TYLENOL Take 1,000 mg by mouth every 6 (six) hours as needed for moderate pain or headache.   albuterol 108 (90 Base) MCG/ACT inhaler Commonly known  as: VENTOLIN HFA Inhale 2 puffs into the lungs every 6 (six) hours as needed for wheezing or shortness of breath.   alectinib 150 MG capsule Commonly known as: ALECENSA Take 4 capsules (600 mg total) by mouth 2 (two) times daily with a meal.   ergocalciferol 1.25 MG (50000 UT) capsule Commonly known as: VITAMIN D2 Take 1 capsule (50,000 Units total) by mouth once a week.   guaifenesin 100 MG/5ML syrup Commonly known as: ROBITUSSIN Take 200 mg by mouth 3 (three) times daily as needed for cough.   hydrOXYzine 25 MG tablet Commonly known as: ATARAX/VISTARIL Take 1 tablet (25 mg total) by  mouth 3 (three) times daily as needed for anxiety. **NEEDS APT FOR FURTHER REFILL**   multivitamin with minerals Tabs tablet Take 2 tablets by mouth daily. Gummies         OBJECTIVE:   PHYSICAL EXAM: VS: BP 122/60 (BP Location: Left Arm, Patient Position: Sitting, Cuff Size: Large)   Pulse 93   Ht 5\' 10"  (1.778 m)   Wt 265 lb 6.4 oz (120.4 kg)   LMP 11/28/2019   SpO2 97%   BMI 38.08 kg/m    EXAM: General: Pt appears well and is in NAD  Neck: General: Supple without adenopathy. Thyroid: Thyroid size normal.  No goiter or nodules appreciated. No thyroid bruit.  Lungs: Clear with good BS bilat with no rales, rhonchi, or wheezes  Heart: Auscultation: RRR.  Abdomen: Normoactive bowel sounds, soft, nontender, without masses or organomegaly palpable  Extremities:  BL LE: No pretibial edema normal ROM and strength.  Mental Status: Judgment, insight: Intact Orientation: Oriented to time, place, and person Mood and affect: No depression, anxiety, or agitation     DATA REVIEWED:  Results for Sarah, Chandler (MRN [id number]) as of 12/25/2019 09:03  Ref. Range 11/06/2019 08:08  TSH Latest Ref Range: 0.35 - 4.50 uIU/mL 16.70 (H)  T4,Free(Direct) Latest Ref Range: 0.60 - 1.60 ng/dL 0.83   Results for Sarah, [name] (MRN [id number]) as of 12/25/2019 09:03  Ref. Range 11/06/2019 08:08  VITD Latest Ref Range: 30.00 - 100.00 ng/mL 16.53 (L)   Results for Sarah, Chandler (MRN [id number]) as of 12/25/2019 09:03  Ref. Range 06/08/2019 14:52  TRAB Latest Ref Range: <=2.00 IU/L 5.19 (H)   ASSESSMENT / PLAN / RECOMMENDATIONS:   1. Hyperthyroidism Secondary to Graves' Disease:  - Pt is clinically euthyroid  - No local neck symptoms  - She has been off methimazole since July,2021  - She will have TFt's repeated next week ,as we do not have a phlebotomist available today and prefers to come back next week rather then go to the [place] office.      2. Vitamin D deficiency:   - Continue  ergocalciferol 50,000 iu weekly      F/U in 3 months Labs in 6 weeks    Signed electronically by: Sarah Guise, MD  [facility]  [facility] [address], [place] [address] Phone: [phone] FAX: [phone]      CC: Sarah Reid, PA-C [place] [address] Phone: [phone]  Fax: [phone]   Return to Endocrinology clinic as below: Future Appointments  Date Time Provider [place]  12/30/2019  8:45 AM [facility] LAB [facility] None  01/27/2020  8:30 AM [facility] None  01/27/2020  9:00 AM Curt Bears, MD [facility] None  01/27/2020 10:00 AM [facility] LAB [facility] None  03/18/2020  8:50  AM Anberlyn Feimster, Melanie Crazier, MD [facility] None

## 2019-12-25 NOTE — Patient Instructions (Signed)
-   Please have labs soon and again  6 weeks

## 2019-12-30 ENCOUNTER — Other Ambulatory Visit (INDEPENDENT_AMBULATORY_CARE_PROVIDER_SITE_OTHER): Payer: Medicaid Other

## 2019-12-30 ENCOUNTER — Other Ambulatory Visit: Payer: Self-pay

## 2019-12-30 DIAGNOSIS — E0500 Thyrotoxicosis with diffuse goiter without thyrotoxic crisis or storm: Secondary | ICD-10-CM | POA: Diagnosis not present

## 2019-12-30 DIAGNOSIS — E559 Vitamin D deficiency, unspecified: Secondary | ICD-10-CM

## 2019-12-30 LAB — VITAMIN D 25 HYDROXY (VIT D DEFICIENCY, FRACTURES): VITD: 22.75 ng/mL — ABNORMAL LOW (ref 30.00–100.00)

## 2019-12-30 LAB — TSH: TSH: 11.22 u[IU]/mL — ABNORMAL HIGH (ref 0.35–4.50)

## 2019-12-30 LAB — T4, FREE: Free T4: 0.81 ng/dL (ref 0.60–1.60)

## 2019-12-31 ENCOUNTER — Telehealth: Payer: Self-pay | Admitting: Internal Medicine

## 2019-12-31 MED ORDER — LEVOTHYROXINE SODIUM 50 MCG PO TABS: 50.0000 ug | ORAL_TABLET | Freq: Every day | ORAL | 3 refills | Status: DC

## 2019-12-31 NOTE — Telephone Encounter (Signed)
I have attempted to call the patient twice, on 9/22 and 9/23 without answer  A portal message has been sent  Her TSH remains elevated despite being off methimazole for 2 months, patient is hypothyroid at this time and L T4 replacement will be initiated  Medication Levothyroxine 50 MCG daily  Patient has a lab appointment in October   Abby Nena Jordan, MD  [facility]  [facility] [address] [place], [place] [address] Phone: [phone] FAX: [phone]

## 2020-01-01 ENCOUNTER — Telehealth: Payer: Self-pay

## 2020-01-01 MED ORDER — LEVOTHYROXINE SODIUM 50 MCG PO TABS: 50.0000 ug | ORAL_TABLET | Freq: Every day | ORAL | 3 refills | Status: DC

## 2020-01-01 NOTE — Telephone Encounter (Signed)
Pt states that medication was supposed to be called in for her thyroid at [facility] on [address], please give patient a call.

## 2020-01-01 NOTE — Telephone Encounter (Signed)
Per result note Dr. Kelton Pillar sent it in to a different pharmacy. MyChart message sent to patient asking if she still wants it sent to the [facility].

## 2020-01-04 ENCOUNTER — Other Ambulatory Visit: Payer: Self-pay

## 2020-01-04 MED ORDER — LEVOTHYROXINE SODIUM 50 MCG PO TABS: 50.0000 ug | ORAL_TABLET | Freq: Every day | ORAL | 3 refills | Status: DC

## 2020-01-15 ENCOUNTER — Ambulatory Visit: Payer: Medicaid Other | Admitting: Internal Medicine

## 2020-01-22 ENCOUNTER — Ambulatory Visit: Payer: Medicaid Other | Admitting: Physician Assistant

## 2020-01-27 ENCOUNTER — Inpatient Hospital Stay (HOSPITAL_BASED_OUTPATIENT_CLINIC_OR_DEPARTMENT_OTHER): Payer: Medicaid Other | Admitting: Internal Medicine

## 2020-01-27 ENCOUNTER — Encounter: Payer: Self-pay | Admitting: Internal Medicine

## 2020-01-27 ENCOUNTER — Inpatient Hospital Stay: Payer: Medicaid Other | Attending: Internal Medicine

## 2020-01-27 ENCOUNTER — Other Ambulatory Visit (INDEPENDENT_AMBULATORY_CARE_PROVIDER_SITE_OTHER): Payer: Medicaid Other

## 2020-01-27 ENCOUNTER — Other Ambulatory Visit: Payer: Self-pay

## 2020-01-27 VITALS — BP 128/69 | HR 71 | Temp 98.2°F | Wt 271.0 lb

## 2020-01-27 DIAGNOSIS — C3491 Malignant neoplasm of unspecified part of right bronchus or lung: Secondary | ICD-10-CM | POA: Insufficient documentation

## 2020-01-27 DIAGNOSIS — C3490 Malignant neoplasm of unspecified part of unspecified bronchus or lung: Secondary | ICD-10-CM

## 2020-01-27 DIAGNOSIS — C7931 Secondary malignant neoplasm of brain: Secondary | ICD-10-CM | POA: Diagnosis present

## 2020-01-27 DIAGNOSIS — D649 Anemia, unspecified: Secondary | ICD-10-CM | POA: Insufficient documentation

## 2020-01-27 DIAGNOSIS — E0590 Thyrotoxicosis, unspecified without thyrotoxic crisis or storm: Secondary | ICD-10-CM

## 2020-01-27 DIAGNOSIS — Z5111 Encounter for antineoplastic chemotherapy: Secondary | ICD-10-CM | POA: Diagnosis not present

## 2020-01-27 LAB — CMP (CANCER CENTER ONLY)
ALT: 23 U/L (ref 0–44)
AST: 21 U/L (ref 15–41)
Albumin: 3.7 g/dL (ref 3.5–5.0)
Alkaline Phosphatase: 165 U/L — ABNORMAL HIGH (ref 38–126)
Anion gap: 8 (ref 5–15)
BUN: 10 mg/dL (ref 6–20)
CO2: 26 mmol/L (ref 22–32)
Calcium: 9.2 mg/dL (ref 8.9–10.3)
Chloride: 106 mmol/L (ref 98–111)
Creatinine: 0.77 mg/dL (ref 0.44–1.00)
GFR, Estimated: >60 mL/min (ref >60)
Glucose, Bld: 74 mg/dL (ref 70–99)
Potassium: 3.6 mmol/L (ref 3.5–5.1)
Sodium: 140 mmol/L (ref 135–145)
Total Bilirubin: 1.3 mg/dL — ABNORMAL HIGH (ref 0.3–1.2)
Total Protein: 7.2 g/dL (ref 6.5–8.1)

## 2020-01-27 LAB — TSH: TSH: 3.24 u[IU]/mL (ref 0.35–4.50)

## 2020-01-27 LAB — CBC WITH DIFFERENTIAL (CANCER CENTER ONLY)
Abs Immature Granulocytes: 0.02 10*3/uL (ref 0.00–0.07)
Basophils Absolute: 0.1 10*3/uL (ref 0.0–0.1)
Basophils Relative: 1 %
Eosinophils Absolute: 0.2 10*3/uL (ref 0.0–0.5)
Eosinophils Relative: 2 %
HCT: 34.3 % — ABNORMAL LOW (ref 36.0–46.0)
Hemoglobin: 11.3 g/dL — ABNORMAL LOW (ref 12.0–15.0)
Immature Granulocytes: 0 %
Lymphocytes Relative: 40 %
Lymphs Abs: 3.3 10*3/uL (ref 0.7–4.0)
MCH: 30.9 pg (ref 26.0–34.0)
MCHC: 32.9 g/dL (ref 30.0–36.0)
MCV: 93.7 fL (ref 80.0–100.0)
Monocytes Absolute: 0.7 10*3/uL (ref 0.1–1.0)
Monocytes Relative: 8 %
Neutro Abs: 4.1 10*3/uL (ref 1.7–7.7)
Neutrophils Relative %: 49 %
Platelet Count: 298 10*3/uL (ref 150–400)
RBC: 3.66 MIL/uL — ABNORMAL LOW (ref 3.87–5.11)
RDW: 14.2 % (ref 11.5–15.5)
WBC Count: 8.3 10*3/uL (ref 4.0–10.5)
nRBC: 0 % (ref 0.0–0.2)

## 2020-01-27 LAB — T4, FREE: Free T4: 0.96 ng/dL (ref 0.60–1.60)

## 2020-01-27 NOTE — Progress Notes (Signed)
[place] [phone]   [phone]  OFFICE PROGRESS NOTE  [name], PA-C [place] [address]  DIAGNOSIS: Stage IV (T3, N2, M1c) non-small cell lung cancer, adenocarcinoma with ALK gene translocation presented with multiple bilateral pulmonary nodules and masses as well as small mediastinal and bilateral axillary lymphadenopathy and metastatic disease to the brain diagnosed in March 2021.  PRIOR THERAPY: None  CURRENT THERAPY: Alecensa (Alectinib) 600 mg p.o. twice daily.  First dose started on July 23 2019.  Status post 6 months of treatment.  INTERVAL HISTORY: Sarah Chandler 34 y.o. female returns to the clinic today for follow-up visit.  The patient is feeling fine today with no concerning complaints.  She denied having any current chest pain, shortness of breath, cough or hemoptysis.  She denied having any nausea, vomiting, diarrhea or constipation.  She has no headache or visual changes.  She denied having any significant weight loss or night sweats.  She started treatment with levothyroxine recently.  She continues to tolerate her treatment with Alecensa fairly well.  She is here today for evaluation and repeat blood work.   MEDICAL HISTORY: Past Medical History:  Diagnosis Date  . nscl ca dx'd 06/2019  . Pneumonia 05/08/2019    ALLERGIES:  is allergic to iron.  MEDICATIONS:  Current Outpatient Medications  Medication Sig Dispense Refill  . acetaminophen (TYLENOL) 500 MG tablet Take 1,000 mg by mouth every 6 (six) hours as needed for moderate pain or headache.    . albuterol (VENTOLIN HFA) 108 (90 Base) MCG/ACT inhaler Inhale 2 puffs into the lungs every 6 (six) hours as needed for wheezing or shortness of breath. 8 g 1  . alectinib (ALECENSA) 150 MG capsule Take 4 capsules (600 mg total) by mouth 2 (two) times daily with a meal. 240 capsule 5  . ergocalciferol (VITAMIN D2) 1.25 MG (50000 UT) capsule  Take 1 capsule (50,000 Units total) by mouth once a week. 13 capsule 1  . guaifenesin (ROBITUSSIN) 100 MG/5ML syrup Take 200 mg by mouth 3 (three) times daily as needed for cough.    . hydrOXYzine (ATARAX/VISTARIL) 25 MG tablet Take 1 tablet (25 mg total) by mouth 3 (three) times daily as needed for anxiety. **NEEDS APT FOR FURTHER REFILL** 30 tablet 0  . levothyroxine (SYNTHROID) 50 MCG tablet Take 1 tablet (50 mcg total) by mouth daily. 30 tablet 3  . Multiple Vitamin (MULTIVITAMIN WITH MINERALS) TABS tablet Take 2 tablets by mouth daily. Gummies     No current facility-administered medications for this visit.    SURGICAL HISTORY:  Past Surgical History:  Procedure Laterality Date  . BIOPSY  07/03/2019   Procedure: BIOPSY;  Surgeon: Rigoberto Noel, MD;  Location: [facility] ENDOSCOPY;  Service: Cardiopulmonary;;  . BRONCHIAL BRUSHINGS  07/03/2019   Procedure: BRONCHIAL BRUSHINGS;  Surgeon: Rigoberto Noel, MD;  Location: [facility] ENDOSCOPY;  Service: Cardiopulmonary;;  . BRONCHIAL WASHINGS  07/03/2019   Procedure: BRONCHIAL WASHINGS;  Surgeon: Rigoberto Noel, MD;  Location: [facility] ENDOSCOPY;  Service: Cardiopulmonary;;  . NO PAST SURGERIES    . TOOTH EXTRACTION    . VIDEO BRONCHOSCOPY N/A 07/03/2019   Procedure: VIDEO BRONCHOSCOPY WITH FLUORO;  Surgeon: Rigoberto Noel, MD;  Location: [place];  Service: Cardiopulmonary;  Laterality: N/A;  LMA    REVIEW OF SYSTEMS:  A comprehensive review of systems was negative.   PHYSICAL EXAMINATION: General appearance: alert, cooperative and no distress  Head: Normocephalic, without obvious abnormality, atraumatic Neck: no adenopathy, no JVD, supple, symmetrical, trachea midline and thyroid not enlarged, symmetric, no tenderness/mass/nodules Lymph nodes: Cervical, supraclavicular, and axillary nodes normal. Resp: clear to auscultation bilaterally Back: symmetric, no curvature. ROM normal. No CVA tenderness. Cardio: regular rate and rhythm, S1, S2 normal, no murmur,  click, rub or gallop GI: soft, non-tender; bowel sounds normal; no masses,  no organomegaly Extremities: extremities normal, atraumatic, no cyanosis or edema  ECOG PERFORMANCE STATUS: 1 - Symptomatic but completely ambulatory  Blood pressure 128/69, pulse 71, temperature 98.2 F (36.8 C), temperature source Tympanic, weight 271 lb (122.9 kg), SpO2 100 %.  LABORATORY DATA: Lab Results  Component Value Date   WBC 8.3 01/27/2020   HGB 11.3 (L) 01/27/2020   HCT 34.3 (L) 01/27/2020   MCV 93.7 01/27/2020   PLT 298 01/27/2020      Chemistry      Component Value Date/Time   NA 141 12/16/2019 0816   NA 139 05/19/2019 1146   K 4.0 12/16/2019 0816   CL 110 12/16/2019 0816   CO2 22 12/16/2019 0816   BUN 17 12/16/2019 0816   BUN 5 (L) 05/19/2019 1146   CREATININE 0.77 12/16/2019 0816      Component Value Date/Time   CALCIUM 9.0 12/16/2019 0816   ALKPHOS 128 (H) 12/16/2019 0816   AST 19 12/16/2019 0816   ALT 18 12/16/2019 0816   BILITOT 1.3 (H) 12/16/2019 0816       RADIOGRAPHIC STUDIES: No results found.  ASSESSMENT AND PLAN: This is a very pleasant 34 years old white female recently diagnosed with stage IV non-small cell lung cancer, adenocarcinoma with positive ALK gene translocation in March 2021.  The patient started treatment with Alecensa on July 23, 2019 and within few days she started not significant improvement in her condition with less shortness of breath as well as decrease in supraclavicular lymphadenopathy.  She is status post 6 months of treatment. The patient has been tolerating her treatment with Alecensa fairly well. Repeat CBC today showed mild persistent anemia.  Comprehensive metabolic panel is still pending. I recommended for the patient to continue her current treatment with Alecensa with the same dose. She will come back for follow-up visit in 6 weeks for evaluation with repeat CT scan of the chest, abdomen pelvis for restaging of her disease. The patient  was advised to call immediately if she has any concerning symptoms in the interval. The patient voices understanding of current disease status and treatment options and is in agreement with the current care plan.  All questions were answered. The patient knows to call the clinic with any problems, questions or concerns. We can certainly see the patient much sooner if necessary.  Disclaimer: This note was dictated with voice recognition software. Similar sounding words can inadvertently be transcribed and may not be corrected upon review.

## 2020-03-07 ENCOUNTER — Other Ambulatory Visit: Payer: Self-pay

## 2020-03-07 ENCOUNTER — Inpatient Hospital Stay: Payer: Medicaid Other | Attending: Internal Medicine

## 2020-03-07 ENCOUNTER — Encounter (HOSPITAL_COMMUNITY): Payer: Self-pay

## 2020-03-07 ENCOUNTER — Ambulatory Visit (HOSPITAL_COMMUNITY)
Admission: RE | Admit: 2020-03-07 | Discharge: 2020-03-07 | Disposition: A | Discharge status: Home / Self Care | Payer: Medicaid Other | Source: Ambulatory Visit | Attending: Internal Medicine | Admitting: Internal Medicine

## 2020-03-07 DIAGNOSIS — M47816 Spondylosis without myelopathy or radiculopathy, lumbar region: Secondary | ICD-10-CM | POA: Diagnosis not present

## 2020-03-07 DIAGNOSIS — C3490 Malignant neoplasm of unspecified part of unspecified bronchus or lung: Secondary | ICD-10-CM

## 2020-03-07 DIAGNOSIS — C3491 Malignant neoplasm of unspecified part of right bronchus or lung: Secondary | ICD-10-CM | POA: Insufficient documentation

## 2020-03-07 DIAGNOSIS — J8489 Other specified interstitial pulmonary diseases: Secondary | ICD-10-CM | POA: Diagnosis not present

## 2020-03-07 DIAGNOSIS — C7931 Secondary malignant neoplasm of brain: Secondary | ICD-10-CM | POA: Insufficient documentation

## 2020-03-07 DIAGNOSIS — Z85118 Personal history of other malignant neoplasm of bronchus and lung: Secondary | ICD-10-CM | POA: Diagnosis not present

## 2020-03-07 DIAGNOSIS — Z85819 Personal history of malignant neoplasm of unspecified site of lip, oral cavity, and pharynx: Secondary | ICD-10-CM | POA: Diagnosis not present

## 2020-03-07 DIAGNOSIS — C7951 Secondary malignant neoplasm of bone: Secondary | ICD-10-CM | POA: Diagnosis not present

## 2020-03-07 LAB — CMP (CANCER CENTER ONLY)
ALT: 19 U/L (ref 0–44)
AST: 18 U/L (ref 15–41)
Albumin: 3.6 g/dL (ref 3.5–5.0)
Alkaline Phosphatase: 131 U/L — ABNORMAL HIGH (ref 38–126)
Anion gap: 9 (ref 5–15)
BUN: 12 mg/dL (ref 6–20)
CO2: 23 mmol/L (ref 22–32)
Calcium: 9 mg/dL (ref 8.9–10.3)
Chloride: 111 mmol/L (ref 98–111)
Creatinine: 0.77 mg/dL (ref 0.44–1.00)
GFR, Estimated: >60 mL/min (ref >60)
Glucose, Bld: 88 mg/dL (ref 70–99)
Potassium: 4.5 mmol/L (ref 3.5–5.1)
Sodium: 143 mmol/L (ref 135–145)
Total Bilirubin: 0.9 mg/dL (ref 0.3–1.2)
Total Protein: 7.1 g/dL (ref 6.5–8.1)

## 2020-03-07 LAB — CBC WITH DIFFERENTIAL (CANCER CENTER ONLY)
Abs Immature Granulocytes: 0.02 10*3/uL (ref 0.00–0.07)
Basophils Absolute: 0.1 10*3/uL (ref 0.0–0.1)
Basophils Relative: 1 %
Eosinophils Absolute: 0.4 10*3/uL (ref 0.0–0.5)
Eosinophils Relative: 5 %
HCT: 34.7 % — ABNORMAL LOW (ref 36.0–46.0)
Hemoglobin: 11.3 g/dL — ABNORMAL LOW (ref 12.0–15.0)
Immature Granulocytes: 0 %
Lymphocytes Relative: 38 %
Lymphs Abs: 2.9 10*3/uL (ref 0.7–4.0)
MCH: 30.7 pg (ref 26.0–34.0)
MCHC: 32.6 g/dL (ref 30.0–36.0)
MCV: 94.3 fL (ref 80.0–100.0)
Monocytes Absolute: 0.7 10*3/uL (ref 0.1–1.0)
Monocytes Relative: 9 %
Neutro Abs: 3.6 10*3/uL (ref 1.7–7.7)
Neutrophils Relative %: 47 %
Platelet Count: 291 10*3/uL (ref 150–400)
RBC: 3.68 MIL/uL — ABNORMAL LOW (ref 3.87–5.11)
RDW: 13.8 % (ref 11.5–15.5)
WBC Count: 7.7 10*3/uL (ref 4.0–10.5)
nRBC: 0 % (ref 0.0–0.2)

## 2020-03-07 MED ORDER — IOHEXOL 300 MG/ML  SOLN
100.0000 mL | Freq: Once | INTRAMUSCULAR | Status: AC | PRN
  Administered 2020-03-07: 100 mL via INTRAVENOUS

## 2020-03-10 ENCOUNTER — Encounter: Payer: Self-pay | Admitting: Internal Medicine

## 2020-03-10 ENCOUNTER — Other Ambulatory Visit: Payer: Self-pay

## 2020-03-10 ENCOUNTER — Inpatient Hospital Stay: Payer: Medicaid Other | Attending: Internal Medicine | Admitting: Internal Medicine

## 2020-03-10 VITALS — BP 148/79 | HR 87 | Temp 97.9°F | Resp 18 | Ht 70.0 in | Wt 278.9 lb

## 2020-03-10 DIAGNOSIS — C7931 Secondary malignant neoplasm of brain: Secondary | ICD-10-CM | POA: Diagnosis present

## 2020-03-10 DIAGNOSIS — Z79899 Other long term (current) drug therapy: Secondary | ICD-10-CM | POA: Insufficient documentation

## 2020-03-10 DIAGNOSIS — C3490 Malignant neoplasm of unspecified part of unspecified bronchus or lung: Secondary | ICD-10-CM

## 2020-03-10 DIAGNOSIS — C3491 Malignant neoplasm of unspecified part of right bronchus or lung: Secondary | ICD-10-CM | POA: Diagnosis present

## 2020-03-10 DIAGNOSIS — Z5111 Encounter for antineoplastic chemotherapy: Secondary | ICD-10-CM

## 2020-03-10 NOTE — Progress Notes (Signed)
[place] [phone]   Fax:([phone]) [phone]  OFFICE PROGRESS NOTE  [name], PA-C [place] [address]  DIAGNOSIS: Stage IV (T3, N2, M1c) non-small cell lung cancer, adenocarcinoma with ALK gene translocation presented with multiple bilateral pulmonary nodules and masses as well as small mediastinal and bilateral axillary lymphadenopathy and metastatic disease to the brain diagnosed in March 2021.  PRIOR THERAPY: None  CURRENT THERAPY: Alecensa (Alectinib) 600 mg p.o. twice daily.  First dose started on July 23 2019.  Status post 7 months of treatment.  INTERVAL HISTORY: Sarah Chandler 34 y.o. female returns to the clinic today for follow-up visit accompanied by her cousin.  The patient is feeling fine today with no concerning complaints.  She was able to moew the yard with no shortness of breath.  She denied having any current chest pain, shortness of breath, cough or hemoptysis.  She has no nausea, vomiting, diarrhea or constipation.  She denied having any headache or visual changes.  She continues to tolerate her treatment with Alecensa fairly well.  The patient had repeat CT scan of the chest, abdomen pelvis performed recently and she is here for evaluation and discussion of her risk her results.   MEDICAL HISTORY: Past Medical History:  Diagnosis Date   nscl ca dx'd 06/2019   Pneumonia 05/08/2019    ALLERGIES:  is allergic to iron.  MEDICATIONS:  Current Outpatient Medications  Medication Sig Dispense Refill   acetaminophen (TYLENOL) 500 MG tablet Take 1,000 mg by mouth every 6 (six) hours as needed for moderate pain or headache.     albuterol (VENTOLIN HFA) 108 (90 Base) MCG/ACT inhaler Inhale 2 puffs into the lungs every 6 (six) hours as needed for wheezing or shortness of breath. 8 g 1   alectinib (ALECENSA) 150 MG capsule Take 4 capsules (600 mg total) by mouth 2 (two) times daily with a meal. 240  capsule 5   ergocalciferol (VITAMIN D2) 1.25 MG (50000 UT) capsule Take 1 capsule (50,000 Units total) by mouth once a week. 13 capsule 1   guaifenesin (ROBITUSSIN) 100 MG/5ML syrup Take 200 mg by mouth 3 (three) times daily as needed for cough.     hydrOXYzine (ATARAX/VISTARIL) 25 MG tablet Take 1 tablet (25 mg total) by mouth 3 (three) times daily as needed for anxiety. **NEEDS APT FOR FURTHER REFILL** 30 tablet 0   levothyroxine (SYNTHROID) 50 MCG tablet Take 1 tablet (50 mcg total) by mouth daily. 30 tablet 3   Multiple Vitamin (MULTIVITAMIN WITH MINERALS) TABS tablet Take 2 tablets by mouth daily. Gummies     No current facility-administered medications for this visit.    SURGICAL HISTORY:  Past Surgical History:  Procedure Laterality Date   BIOPSY  07/03/2019   Procedure: BIOPSY;  Surgeon: Rigoberto Noel, MD;  Location: [facility] ENDOSCOPY;  Service: Cardiopulmonary;;   BRONCHIAL BRUSHINGS  07/03/2019   Procedure: BRONCHIAL BRUSHINGS;  Surgeon: Rigoberto Noel, MD;  Location: [place];  Service: Cardiopulmonary;;   BRONCHIAL WASHINGS  07/03/2019   Procedure: BRONCHIAL WASHINGS;  Surgeon: Rigoberto Noel, MD;  Location: [place];  Service: Cardiopulmonary;;   NO PAST SURGERIES     TOOTH EXTRACTION     VIDEO BRONCHOSCOPY N/A 07/03/2019   Procedure: VIDEO BRONCHOSCOPY WITH FLUORO;  Surgeon: Rigoberto Noel, MD;  Location: [place];  Service: Cardiopulmonary;  Laterality: N/A;  LMA    REVIEW OF SYSTEMS:  Constitutional: negative  Eyes: negative Ears, nose, mouth, throat, and face: negative Respiratory: negative Cardiovascular: negative Gastrointestinal: negative Genitourinary:negative Integument/breast: negative Hematologic/lymphatic: negative Musculoskeletal:negative Neurological: negative Behavioral/Psych: negative Endocrine: negative Allergic/Immunologic: negative   PHYSICAL EXAMINATION: General appearance: alert, cooperative and no distress Head: Normocephalic,  without obvious abnormality, atraumatic Neck: no adenopathy, no JVD, supple, symmetrical, trachea midline and thyroid not enlarged, symmetric, no tenderness/mass/nodules Lymph nodes: Cervical, supraclavicular, and axillary nodes normal. Resp: clear to auscultation bilaterally Back: symmetric, no curvature. ROM normal. No CVA tenderness. Cardio: regular rate and rhythm, S1, S2 normal, no murmur, click, rub or gallop GI: soft, non-tender; bowel sounds normal; no masses,  no organomegaly Extremities: extremities normal, atraumatic, no cyanosis or edema Neurologic: Alert and oriented X 3, normal strength and tone. Normal symmetric reflexes. Normal coordination and gait  ECOG PERFORMANCE STATUS: 0 - Asymptomatic  Blood pressure (!) 148/79, pulse 87, temperature 97.9 F (36.6 C), temperature source Tympanic, resp. rate 18, height 5' 10" (1.778 m), weight 278 lb 14.4 oz (126.5 kg), last menstrual period 02/09/2020, SpO2 98 %.  LABORATORY DATA: Lab Results  Component Value Date   WBC 7.7 03/07/2020   HGB 11.3 (L) 03/07/2020   HCT 34.7 (L) 03/07/2020   MCV 94.3 03/07/2020   PLT 291 03/07/2020      Chemistry      Component Value Date/Time   NA 143 03/07/2020 0933   NA 139 05/19/2019 1146   K 4.5 03/07/2020 0933   CL 111 03/07/2020 0933   CO2 23 03/07/2020 0933   BUN 12 03/07/2020 0933   BUN 5 (L) 05/19/2019 1146   CREATININE 0.77 03/07/2020 0933      Component Value Date/Time   CALCIUM 9.0 03/07/2020 0933   ALKPHOS 131 (H) 03/07/2020 0933   AST 18 03/07/2020 0933   ALT 19 03/07/2020 0933   BILITOT 0.9 03/07/2020 0933       RADIOGRAPHIC STUDIES: CT Chest W Contrast  Result Date: 03/07/2020 CLINICAL DATA:  Restaging of non-small cell lung cancer with ongoing oral chemotherapy. EXAM: CT CHEST, ABDOMEN, AND PELVIS WITH CONTRAST TECHNIQUE: Multidetector CT imaging of the chest, abdomen and pelvis was performed following the standard protocol during bolus administration of  intravenous contrast. CONTRAST:  132m OMNIPAQUE IOHEXOL 300 MG/ML  SOLN COMPARISON:  12/16/2019 FINDINGS: CT CHEST FINDINGS Cardiovascular: Unremarkable Mediastinum/Nodes: Unremarkable Lungs/Pleura: Peripheral reticular along with some very faint micronodular opacities in the lungs are generally stable although there is some mild improvement in the lingula. Stable faint nodularity along the fissures. Stable minimal ground-glass nodularity at the right lung apex including a 0.6 cm nodule on image 48 of series 6. Musculoskeletal: Widespread tiny sclerotic lesions are present throughout the skeleton, but most appreciable in the spine and sternum, no change from 12/16/2019. CT ABDOMEN PELVIS FINDINGS Hepatobiliary: Unremarkable Pancreas: Unremarkable Spleen: Unremarkable Adrenals/Urinary Tract: Unremarkable Stomach/Bowel: Prominent stool throughout the colon favors constipation. Vascular/Lymphatic: Unremarkable Reproductive: Unremarkable Other: No supplemental non-categorized findings. Musculoskeletal: Innumerable punctate sclerotic foci in the skeleton unchanged, likely related to remote metastatic disease. There is lower lumbar spondylosis and degenerative disc disease with mild resulting impingement at L4-5 and L5-S1. IMPRESSION: 1. Essentially stable appearance of the lungs with scattered mild interstitial accentuation with some very faint micro nodularity, but with some mild improvement in the lingula. Based on prior examinations the appearance suggests effectively treated lymphangitic tumor. 2. Innumerable punctate sclerotic foci in the skeleton likely related to remote metastatic disease. 3. Prominent stool throughout the colon favors constipation. 4. Lower lumbar spondylosis and degenerative disc disease  with mild resulting impingement at L4-5 and L5-S1. Electronically Signed   By: Van Clines M.D.   On: 03/07/2020 13:16   CT Abdomen Pelvis W Contrast  Result Date: 03/07/2020 CLINICAL DATA:   Restaging of non-small cell lung cancer with ongoing oral chemotherapy. EXAM: CT CHEST, ABDOMEN, AND PELVIS WITH CONTRAST TECHNIQUE: Multidetector CT imaging of the chest, abdomen and pelvis was performed following the standard protocol during bolus administration of intravenous contrast. CONTRAST:  119m OMNIPAQUE IOHEXOL 300 MG/ML  SOLN COMPARISON:  12/16/2019 FINDINGS: CT CHEST FINDINGS Cardiovascular: Unremarkable Mediastinum/Nodes: Unremarkable Lungs/Pleura: Peripheral reticular along with some very faint micronodular opacities in the lungs are generally stable although there is some mild improvement in the lingula. Stable faint nodularity along the fissures. Stable minimal ground-glass nodularity at the right lung apex including a 0.6 cm nodule on image 48 of series 6. Musculoskeletal: Widespread tiny sclerotic lesions are present throughout the skeleton, but most appreciable in the spine and sternum, no change from 12/16/2019. CT ABDOMEN PELVIS FINDINGS Hepatobiliary: Unremarkable Pancreas: Unremarkable Spleen: Unremarkable Adrenals/Urinary Tract: Unremarkable Stomach/Bowel: Prominent stool throughout the colon favors constipation. Vascular/Lymphatic: Unremarkable Reproductive: Unremarkable Other: No supplemental non-categorized findings. Musculoskeletal: Innumerable punctate sclerotic foci in the skeleton unchanged, likely related to remote metastatic disease. There is lower lumbar spondylosis and degenerative disc disease with mild resulting impingement at L4-5 and L5-S1. IMPRESSION: 1. Essentially stable appearance of the lungs with scattered mild interstitial accentuation with some very faint micro nodularity, but with some mild improvement in the lingula. Based on prior examinations the appearance suggests effectively treated lymphangitic tumor. 2. Innumerable punctate sclerotic foci in the skeleton likely related to remote metastatic disease. 3. Prominent stool throughout the colon favors constipation.  4. Lower lumbar spondylosis and degenerative disc disease with mild resulting impingement at L4-5 and L5-S1. Electronically Signed   By: WVan ClinesM.D.   On: 03/07/2020 13:16    ASSESSMENT AND PLAN: This is a very pleasant 34years old white female recently diagnosed with stage IV non-small cell lung cancer, adenocarcinoma with positive ALK gene translocation in March 2021.  The patient started treatment with Alecensa on July 23, 2019 and within few days she started not significant improvement in her condition with less shortness of breath as well as decrease in supraclavicular lymphadenopathy.  She is status post 7 months of treatment. The patient continues to tolerate this treatment fairly well with no concerning adverse effects. She had repeat CT scan of the chest, abdomen pelvis performed recently.  I personally and independently reviewed the scan and discussed the results with the patient and her cousin today. Her scan showed no concerning findings for disease progression. I recommended for her to continue her current treatment with Alecensa with the same dose. I will see her back for follow-up visit in 2 months for evaluation and repeat blood work. She was advised to call immediately if she has any concerning symptoms in the interval. The patient voices understanding of current disease status and treatment options and is in agreement with the current care plan.  All questions were answered. The patient knows to call the clinic with any problems, questions or concerns. We can certainly see the patient much sooner if necessary.  Disclaimer: This note was dictated with voice recognition software. Similar sounding words can inadvertently be transcribed and may not be corrected upon review.

## 2020-03-15 ENCOUNTER — Telehealth: Payer: Self-pay | Admitting: Internal Medicine

## 2020-03-15 NOTE — Telephone Encounter (Signed)
Scheduled per los. Called and spoke with patient. Confirmed appt 

## 2020-03-18 ENCOUNTER — Encounter: Payer: Self-pay | Admitting: Internal Medicine

## 2020-03-18 ENCOUNTER — Other Ambulatory Visit: Payer: Self-pay

## 2020-03-18 ENCOUNTER — Ambulatory Visit (INDEPENDENT_AMBULATORY_CARE_PROVIDER_SITE_OTHER): Payer: Medicaid Other | Admitting: Internal Medicine

## 2020-03-18 VITALS — BP 136/82 | HR 77 | Ht 70.0 in | Wt 280.1 lb

## 2020-03-18 DIAGNOSIS — E559 Vitamin D deficiency, unspecified: Secondary | ICD-10-CM | POA: Diagnosis not present

## 2020-03-18 DIAGNOSIS — L309 Dermatitis, unspecified: Secondary | ICD-10-CM | POA: Insufficient documentation

## 2020-03-18 DIAGNOSIS — E039 Hypothyroidism, unspecified: Secondary | ICD-10-CM | POA: Insufficient documentation

## 2020-03-18 LAB — VITAMIN D 25 HYDROXY (VIT D DEFICIENCY, FRACTURES): VITD: 23.63 ng/mL — ABNORMAL LOW (ref 30.00–100.00)

## 2020-03-18 LAB — TSH: TSH: 4.33 u[IU]/mL (ref 0.35–4.50)

## 2020-03-18 MED ORDER — HYDROCORTISONE 1 % EX CREA: 1.0000 "application " | TOPICAL_CREAM | Freq: Two times a day (BID) | CUTANEOUS | 0 refills | Status: DC

## 2020-03-18 MED ORDER — LEVOTHYROXINE SODIUM 75 MCG PO TABS: 75.0000 ug | ORAL_TABLET | Freq: Every day | ORAL | 3 refills | Status: DC

## 2020-03-18 MED ORDER — ERGOCALCIFEROL 1.25 MG (50000 UT) PO CAPS
50000.0000 [IU] | ORAL_CAPSULE | ORAL | 2 refills | Status: DC
Start: 2020-03-21 — End: 2020-09-30

## 2020-03-18 NOTE — Patient Instructions (Signed)
Please use hydrocortisone cream on both hands twice daily until the rash improves, then switch to as needed  Please use moisturizer cream multiple times a day  Wear rubber gloves when washing dishes etc

## 2020-03-18 NOTE — Progress Notes (Signed)
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 She has followed with [facility] since 06/08/2019 for consultative assistance with management of her hyperthyroidism.   HISTORICAL SUMMARY:  Pt presented to the ED on 05/10/2019 with palpitations and fatigue. She was noted to have a suppressed TSH < 0.005 uIU/mL with elevated FT4 at 1.70 ng/dL .She was started on Methimazole at the time.   In the meantime she was diagnosed with metastatic lung ca and was on Alectinib. TSH trended up to 91 uIU/mL by 09/2019 and methimazole was stopped.   LT-4 replacement was started 12/2019  No FH of thyroid disease SUBJECTIVE:   Today (03/18/2020):  Sarah Chandler is here for a follow up on hypothyroidism   She continues on  Alectinib started 07/2019 for metastatic lung cancer     Weight continues to trend up   Arthralgias have resolved   Has a hand rash on the hand that statred a month ago , which is pruritic . OTC eczema cream has helped    Denies constipation  Denies local neck symptoms    Levothyroxine 50 mcg daily  Ergocalciferol 50, 000 iu weekly     HISTORY:  Past Medical History:  Past Medical History:  Diagnosis Date  . nscl ca dx'd 06/2019  . Pneumonia 05/08/2019   Past Surgical History:  Past Surgical History:  Procedure Laterality Date  . BIOPSY  07/03/2019   Procedure: BIOPSY;  Surgeon: Sarah Noel, MD;  Location: [facility] ENDOSCOPY;  Service: Cardiopulmonary;;  . BRONCHIAL BRUSHINGS  07/03/2019   Procedure: BRONCHIAL BRUSHINGS;  Surgeon: Sarah Noel, MD;  Location: [facility] ENDOSCOPY;  Service: Cardiopulmonary;;  . BRONCHIAL WASHINGS   07/03/2019   Procedure: BRONCHIAL WASHINGS;  Surgeon: Sarah Noel, MD;  Location: [facility] ENDOSCOPY;  Service: Cardiopulmonary;;  . NO PAST SURGERIES    . TOOTH EXTRACTION    . VIDEO BRONCHOSCOPY N/A 07/03/2019   Procedure: VIDEO BRONCHOSCOPY WITH FLUORO;  Surgeon: Sarah Noel, MD;  Location: [place];  Service: Cardiopulmonary;  Laterality: N/A;  LMA    Social History:  reports that she has never smoked. She has never used smokeless tobacco. She reports that she does not drink alcohol and does not use drugs. Family History:  Family History  Problem Relation Age of Onset  . Diabetes Father   . Hyperlipidemia Father   . Hypertension Father   . Depression Maternal Uncle   . Diabetes Maternal Uncle   . Cancer Maternal Grandmother   . Hyperlipidemia Maternal Grandmother   . Cancer Paternal Grandfather      HOME MEDICATIONS: Allergies as of 03/18/2020      Reactions   Iron Other (See Comments)   Ferrosol - Liquid  Unknown reaction       Medication List       Accurate as of March 18, 2020  2:11 PM. If you have any questions, ask your nurse or doctor.        STOP taking these medications   guaifenesin 100 MG/5ML syrup Commonly known as: ROBITUSSIN Stopped by: Sarah Sciara, MD     TAKE these medications   acetaminophen 500  MG tablet Commonly known as: TYLENOL Take 1,000 mg by mouth every 6 (six) hours as needed for moderate pain or headache.   albuterol 108 (90 Base) MCG/ACT inhaler Commonly known as: VENTOLIN HFA Inhale 2 puffs into the lungs every 6 (six) hours as needed for wheezing or shortness of breath.   alectinib 150 MG capsule Commonly known as: ALECENSA Take 4 capsules (600 mg total) by mouth 2 (two) times daily with a meal.   ergocalciferol 1.25 MG (50000 UT) capsule Commonly known as: VITAMIN D2 Take 1 capsule (50,000 Units total) by mouth once a week.   hydrocortisone cream 1 % Apply 1 application topically 2 (two) times daily. Started  by: Sarah Sciara, MD   hydrOXYzine 25 MG tablet Commonly known as: ATARAX/VISTARIL Take 1 tablet (25 mg total) by mouth 3 (three) times daily as needed for anxiety. **NEEDS APT FOR FURTHER REFILL**   levothyroxine 50 MCG tablet Commonly known as: SYNTHROID Take 1 tablet (50 mcg total) by mouth daily.   multivitamin with minerals Tabs tablet Take 2 tablets by mouth daily. Gummies         OBJECTIVE:   PHYSICAL EXAM: VS: BP 136/82   Pulse 77   Ht 5\' 10"  (1.778 m)   Wt 280 lb 2 oz (127.1 kg)   LMP 03/10/2020   SpO2 98%   BMI 40.19 kg/m    EXAM: General: Pt appears well and is in NAD  Neck: General: Supple without adenopathy. Thyroid: Thyroid size normal.  No goiter or nodules appreciated.   Lungs: Clear with good BS bilat with no rales, rhonchi, or wheezes  Heart: Auscultation: RRR.  Abdomen: Normoactive bowel sounds, soft, nontender, without masses or organomegaly palpable  Skin: Erythematous papular and vesicular rash on both palms   Extremities:  BL LE: No pretibial edema normal ROM and strength.  Mental Status: Judgment, insight: Intact Orientation: Oriented to time, place, and person Mood and affect: No depression, anxiety, or agitation     DATA REVIEWED: Results for Sarah Chandler, Sarah Chandler (MRN [id number]) as of 03/18/2020 14:11  Ref. Range 03/18/2020 09:13  VITD Latest Ref Range: 30.00 - 100.00 ng/mL 23.63 (L)  TSH Latest Ref Range: 0.35 - 4.50 uIU/mL 4.33    Results for Sarah Chandler, Sarah Chandler (MRN [id number]) as of 12/25/2019 09:03  Ref. Range 06/08/2019 14:52  TRAB Latest Ref Range: <=2.00 IU/L 5.19 (H)   ASSESSMENT / PLAN / RECOMMENDATIONS:   1. Hypothyroidism :    - She was initially diagnosed with hyperthyroidism secondary to Graves' disease in 04/2019, she had to be started on Alectinib in 07/2019 for metastatic lung cancer and by 09/2019 she was off Methimazole followed by hypothyroidism . I suspect Alectinib may have to do with hypothyroidism development.   - No local neck symptoms  - TSH at 4.33 uIU/Ml , she continues with weight gain. Will increase levothyroxine as below    Medication Stop Levothyroxine 50 mcg daily  Start Levothyroxine 75 mcg daily   2. Graves' Disease:  - No extra thyroidal manifestations of thyroid disease  3. Vitamin D deficiency:  - Repeat Vitamin D still low , will increase as below   Medication   - Increase ergocalciferol 50,000 iu twice weekly   4. 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 She will be proscribed HC cream, she was advised to wear rubber gloves with cotton lining when washing dishes etc and keep the hands moisturized as much as possible  Medication  HC cream 1% BID   5. Weight gain :  - We discussed following  A certain diet such weight watchers, noom or optavia    F/U in 6 months     Signed electronically by: Mack Guise, MD  [facility] Endocrinology  [facility] [address] [place], [place] [address] Phone: [phone] FAX: [phone]      CC: Sarah Reid, PA-C [place] [address] Phone: [phone]  Fax: [phone]   Return to Endocrinology clinic as below: Future Appointments  Date Time Provider [place]  05/11/2020 11:00 AM [facility] None  05/11/2020 11:30 AM Curt Bears, MD [facility] None  09/30/2020  8:30 AM Atari Novick, Melanie Crazier, MD [facility] None

## 2020-05-11 ENCOUNTER — Other Ambulatory Visit: Payer: Self-pay

## 2020-05-11 ENCOUNTER — Inpatient Hospital Stay: Payer: Medicaid Other

## 2020-05-11 ENCOUNTER — Inpatient Hospital Stay: Payer: Medicaid Other | Attending: Internal Medicine | Admitting: Internal Medicine

## 2020-05-11 VITALS — BP 156/90 | HR 88 | Temp 97.9°F | Resp 17 | Ht 70.0 in | Wt 290.1 lb

## 2020-05-11 DIAGNOSIS — C3490 Malignant neoplasm of unspecified part of unspecified bronchus or lung: Secondary | ICD-10-CM

## 2020-05-11 DIAGNOSIS — C3492 Malignant neoplasm of unspecified part of left bronchus or lung: Secondary | ICD-10-CM | POA: Diagnosis not present

## 2020-05-11 DIAGNOSIS — R21 Rash and other nonspecific skin eruption: Secondary | ICD-10-CM | POA: Insufficient documentation

## 2020-05-11 DIAGNOSIS — Z5111 Encounter for antineoplastic chemotherapy: Secondary | ICD-10-CM

## 2020-05-11 DIAGNOSIS — C3491 Malignant neoplasm of unspecified part of right bronchus or lung: Secondary | ICD-10-CM | POA: Insufficient documentation

## 2020-05-11 DIAGNOSIS — C7931 Secondary malignant neoplasm of brain: Secondary | ICD-10-CM | POA: Diagnosis present

## 2020-05-11 LAB — CBC WITH DIFFERENTIAL (CANCER CENTER ONLY)
Abs Immature Granulocytes: 0.02 10*3/uL (ref 0.00–0.07)
Basophils Absolute: 0.1 10*3/uL (ref 0.0–0.1)
Basophils Relative: 1 %
Eosinophils Absolute: 0.1 10*3/uL (ref 0.0–0.5)
Eosinophils Relative: 1 %
HCT: 34.3 % — ABNORMAL LOW (ref 36.0–46.0)
Hemoglobin: 11.7 g/dL — ABNORMAL LOW (ref 12.0–15.0)
Immature Granulocytes: 0 %
Lymphocytes Relative: 34 %
Lymphs Abs: 3 10*3/uL (ref 0.7–4.0)
MCH: 30.9 pg (ref 26.0–34.0)
MCHC: 34.1 g/dL (ref 30.0–36.0)
MCV: 90.5 fL (ref 80.0–100.0)
Monocytes Absolute: 0.8 10*3/uL (ref 0.1–1.0)
Monocytes Relative: 9 %
Neutro Abs: 4.8 10*3/uL (ref 1.7–7.7)
Neutrophils Relative %: 55 %
Platelet Count: 286 10*3/uL (ref 150–400)
RBC: 3.79 MIL/uL — ABNORMAL LOW (ref 3.87–5.11)
RDW: 13.7 % (ref 11.5–15.5)
WBC Count: 8.7 10*3/uL (ref 4.0–10.5)
nRBC: 0 % (ref 0.0–0.2)

## 2020-05-11 LAB — CMP (CANCER CENTER ONLY)
ALT: 30 U/L (ref 0–44)
AST: 22 U/L (ref 15–41)
Albumin: 3.9 g/dL (ref 3.5–5.0)
Alkaline Phosphatase: 154 U/L — ABNORMAL HIGH (ref 38–126)
Anion gap: 7 (ref 5–15)
BUN: 13 mg/dL (ref 6–20)
CO2: 25 mmol/L (ref 22–32)
Calcium: 9.2 mg/dL (ref 8.9–10.3)
Chloride: 107 mmol/L (ref 98–111)
Creatinine: 0.79 mg/dL (ref 0.44–1.00)
GFR, Estimated: >60 mL/min (ref >60)
Glucose, Bld: 85 mg/dL (ref 70–99)
Potassium: 4.1 mmol/L (ref 3.5–5.1)
Sodium: 139 mmol/L (ref 135–145)
Total Bilirubin: 1 mg/dL (ref 0.3–1.2)
Total Protein: 7.5 g/dL (ref 6.5–8.1)

## 2020-05-11 NOTE — Progress Notes (Signed)
[name]   [phone]  OFFICE PROGRESS NOTE  [name], PA-C [name] [address]  DIAGNOSIS: Stage IV (T3, N2, M1c) non-small cell lung cancer, adenocarcinoma with ALK gene translocation presented with multiple bilateral pulmonary nodules and masses as well as small mediastinal and bilateral axillary lymphadenopathy and metastatic disease to the brain diagnosed in March 2021.  PRIOR THERAPY: None  CURRENT THERAPY: Alecensa (Alectinib) 600 mg p.o. twice daily.  First dose started on July 23 2019.  Status post 9 months of treatment.  INTERVAL HISTORY: Sarah Chandler 35 y.o. female returns to the clinic today for follow-up visit accompanied by her cousin.  The patient is feeling fine today with no concerning complaints.  She gained 10 more pounds since her last visit.  She is dealing with some thyroid issues but this could be also secondary to her treatment with Alecensa.  The patient denied having any current chest pain, shortness of breath, cough or hemoptysis.  She denied having any nausea, vomiting, diarrhea or constipation.  She has no headache or visual changes.  She is here today for evaluation and repeat blood work.  She had a small area of skin rash on the right leg.   MEDICAL HISTORY: Past Medical History:  Diagnosis Date  . nscl ca dx'd 06/2019  . Pneumonia 05/08/2019    ALLERGIES:  is allergic to iron.  MEDICATIONS:  Current Outpatient Medications  Medication Sig Dispense Refill  . acetaminophen (TYLENOL) 500 MG tablet Take 1,000 mg by mouth every 6 (six) hours as needed for moderate pain or headache.    . albuterol (VENTOLIN HFA) 108 (90 Base) MCG/ACT inhaler Inhale 2 puffs into the lungs every 6 (six) hours as needed for wheezing or shortness of breath. 8 g 1  . alectinib (ALECENSA) 150 MG capsule Take 4 capsules (600 mg total) by mouth 2 (two) times daily with a meal. 240 capsule 5   . ergocalciferol (VITAMIN D2) 1.25 MG (50000 UT) capsule Take 1 capsule (50,000 Units total) by mouth 2 (two) times a week. 26 capsule 2  . hydrocortisone cream 1 % Apply 1 application topically 2 (two) times daily. 30 g 0  . hydrOXYzine (ATARAX/VISTARIL) 25 MG tablet Take 1 tablet (25 mg total) by mouth 3 (three) times daily as needed for anxiety. **NEEDS APT FOR FURTHER REFILL** 30 tablet 0  . levothyroxine (SYNTHROID) 75 MCG tablet Take 1 tablet (75 mcg total) by mouth daily. 90 tablet 3  . Multiple Vitamin (MULTIVITAMIN WITH MINERALS) TABS tablet Take 2 tablets by mouth daily. Gummies     No current facility-administered medications for this visit.    SURGICAL HISTORY:  Past Surgical History:  Procedure Laterality Date  . BIOPSY  07/03/2019   Procedure: BIOPSY;  Surgeon: Rigoberto Noel, MD;  Location: [facility] ENDOSCOPY;  Service: Cardiopulmonary;;  . BRONCHIAL BRUSHINGS  07/03/2019   Procedure: BRONCHIAL BRUSHINGS;  Surgeon: Rigoberto Noel, MD;  Location: [facility] ENDOSCOPY;  Service: Cardiopulmonary;;  . BRONCHIAL WASHINGS  07/03/2019   Procedure: BRONCHIAL WASHINGS;  Surgeon: Rigoberto Noel, MD;  Location: [facility] ENDOSCOPY;  Service: Cardiopulmonary;;  . NO PAST SURGERIES    . TOOTH EXTRACTION    . VIDEO BRONCHOSCOPY N/A 07/03/2019   Procedure: VIDEO BRONCHOSCOPY WITH FLUORO;  Surgeon: Rigoberto Noel, MD;  Location: [place];  Service: Cardiopulmonary;  Laterality: N/A;  LMA    REVIEW OF SYSTEMS:  A comprehensive review  of systems was negative except for: Integument/breast: positive for rash   PHYSICAL EXAMINATION: General appearance: alert, cooperative and no distress Head: Normocephalic, without obvious abnormality, atraumatic Neck: no adenopathy, no JVD, supple, symmetrical, trachea midline and thyroid not enlarged, symmetric, no tenderness/mass/nodules Lymph nodes: Cervical, supraclavicular, and axillary nodes normal. Resp: clear to auscultation bilaterally Back: symmetric, no curvature.  ROM normal. No CVA tenderness. Cardio: regular rate and rhythm, S1, S2 normal, no murmur, click, rub or gallop GI: soft, non-tender; bowel sounds normal; no masses,  no organomegaly Extremities: extremities normal, atraumatic, no cyanosis or edema  ECOG PERFORMANCE STATUS: 0 - Asymptomatic  Blood pressure (!) 156/90, pulse 88, temperature 97.9 F (36.6 C), temperature source Tympanic, resp. rate 17, height _0  (1.778 m), weight 290 lb 1.6 oz (131.6 kg), SpO2 99 %.  LABORATORY DATA: Lab Results  Component Value Date   WBC 8.7 05/11/2020   HGB 11.7 (L) 05/11/2020   HCT 34.3 (L) 05/11/2020   MCV 90.5 05/11/2020   PLT 286 05/11/2020      Chemistry      Component Value Date/Time   NA 139 05/11/2020 1100   NA 139 05/19/2019 1146   K 4.1 05/11/2020 1100   CL 107 05/11/2020 1100   CO2 25 05/11/2020 1100   BUN 13 05/11/2020 1100   BUN 5 (L) 05/19/2019 1146   CREATININE 0.79 05/11/2020 1100      Component Value Date/Time   CALCIUM 9.2 05/11/2020 1100   ALKPHOS 154 (H) 05/11/2020 1100   AST 22 05/11/2020 1100   ALT 30 05/11/2020 1100   BILITOT 1.0 05/11/2020 1100       RADIOGRAPHIC STUDIES: No results found.  ASSESSMENT AND PLAN: This is a very pleasant 35 years old white female recently diagnosed with stage IV non-small cell lung cancer, adenocarcinoma with positive ALK gene translocation in March 2021.  The patient started treatment with Alecensa on July 23, 2019 and within few days she started not significant improvement in her condition with less shortness of breath as well as decrease in supraclavicular lymphadenopathy.  She is currently on treatment with Alecensa 600 mg p.o. twice daily status post 9 months of treatment. The patient has been tolerating this treatment well with no concerning adverse effects. I recommended for her to continue her current treatment with Alecensa with the same dose. I will see her back for follow-up visit in 2 months for evaluation with  repeat CT scan of the chest, abdomen pelvis for restaging of her disease. For the hypothyroidism, she is followed by endocrinology. For the skin rash I asked her to apply hydrocortisone cream to that area. The patient was advised to call immediately if she has any concerning symptoms in the interval. The patient voices understanding of current disease status and treatment options and is in agreement with the current care plan.  All questions were answered. The patient knows to call the clinic with any problems, questions or concerns. We can certainly see the patient much sooner if necessary.  Disclaimer: This note was dictated with voice recognition software. Similar sounding words can inadvertently be transcribed and may not be corrected upon review.

## 2020-05-20 ENCOUNTER — Other Ambulatory Visit: Payer: Self-pay | Admitting: Physician Assistant

## 2020-05-20 DIAGNOSIS — C3490 Malignant neoplasm of unspecified part of unspecified bronchus or lung: Secondary | ICD-10-CM

## 2020-05-20 MED ORDER — ALECTINIB HCL 150 MG PO CAPS: 600.0000 mg | ORAL_CAPSULE | Freq: Two times a day (BID) | ORAL | 5 refills | Status: DC

## 2020-06-28 ENCOUNTER — Other Ambulatory Visit (HOSPITAL_COMMUNITY): Payer: Self-pay | Admitting: Internal Medicine

## 2020-06-28 ENCOUNTER — Telehealth: Payer: Self-pay | Admitting: Medical Oncology

## 2020-06-28 ENCOUNTER — Other Ambulatory Visit: Payer: Self-pay | Admitting: Medical Oncology

## 2020-06-28 DIAGNOSIS — C3490 Malignant neoplasm of unspecified part of unspecified bronchus or lung: Secondary | ICD-10-CM

## 2020-06-28 MED ORDER — ALECTINIB HCL 150 MG PO CAPS: 600.0000 mg | ORAL_CAPSULE | Freq: Two times a day (BID) | ORAL | 5 refills | Status: DC

## 2020-06-28 NOTE — Telephone Encounter (Signed)
transferred her call to [facility] pharmacy team.

## 2020-07-08 ENCOUNTER — Telehealth: Payer: Self-pay | Admitting: Internal Medicine

## 2020-07-08 NOTE — Telephone Encounter (Signed)
Scheduled appt per 4/1 sch msg. Called pt, no answer. Left msg with appt date and time.

## 2020-07-11 ENCOUNTER — Ambulatory Visit (HOSPITAL_COMMUNITY)
Admission: RE | Admit: 2020-07-11 | Discharge: 2020-07-11 | Disposition: A | Discharge status: Home / Self Care | Payer: Medicaid Other | Source: Ambulatory Visit | Attending: Internal Medicine | Admitting: Internal Medicine

## 2020-07-11 ENCOUNTER — Other Ambulatory Visit: Payer: Self-pay

## 2020-07-11 ENCOUNTER — Inpatient Hospital Stay: Payer: Medicaid Other | Attending: Internal Medicine

## 2020-07-11 DIAGNOSIS — C7931 Secondary malignant neoplasm of brain: Secondary | ICD-10-CM | POA: Diagnosis present

## 2020-07-11 DIAGNOSIS — N8311 Corpus luteum cyst of right ovary: Secondary | ICD-10-CM | POA: Diagnosis not present

## 2020-07-11 DIAGNOSIS — C3491 Malignant neoplasm of unspecified part of right bronchus or lung: Secondary | ICD-10-CM | POA: Insufficient documentation

## 2020-07-11 DIAGNOSIS — C3492 Malignant neoplasm of unspecified part of left bronchus or lung: Secondary | ICD-10-CM | POA: Insufficient documentation

## 2020-07-11 DIAGNOSIS — C773 Secondary and unspecified malignant neoplasm of axilla and upper limb lymph nodes: Secondary | ICD-10-CM | POA: Diagnosis not present

## 2020-07-11 DIAGNOSIS — C7951 Secondary malignant neoplasm of bone: Secondary | ICD-10-CM | POA: Diagnosis not present

## 2020-07-11 DIAGNOSIS — C3490 Malignant neoplasm of unspecified part of unspecified bronchus or lung: Secondary | ICD-10-CM | POA: Diagnosis not present

## 2020-07-11 LAB — CMP (CANCER CENTER ONLY)
ALT: 26 U/L (ref 0–44)
AST: 22 U/L (ref 15–41)
Albumin: 3.9 g/dL (ref 3.5–5.0)
Alkaline Phosphatase: 145 U/L — ABNORMAL HIGH (ref 38–126)
Anion gap: 12 (ref 5–15)
BUN: 13 mg/dL (ref 6–20)
CO2: 25 mmol/L (ref 22–32)
Calcium: 8.7 mg/dL — ABNORMAL LOW (ref 8.9–10.3)
Chloride: 103 mmol/L (ref 98–111)
Creatinine: 0.81 mg/dL (ref 0.44–1.00)
GFR, Estimated: >60 mL/min (ref >60)
Glucose, Bld: 85 mg/dL (ref 70–99)
Potassium: 3.9 mmol/L (ref 3.5–5.1)
Sodium: 140 mmol/L (ref 135–145)
Total Bilirubin: 0.8 mg/dL (ref 0.3–1.2)
Total Protein: 7.5 g/dL (ref 6.5–8.1)

## 2020-07-11 LAB — CBC WITH DIFFERENTIAL (CANCER CENTER ONLY)
Abs Immature Granulocytes: 0.03 10*3/uL (ref 0.00–0.07)
Basophils Absolute: 0.1 10*3/uL (ref 0.0–0.1)
Basophils Relative: 1 %
Eosinophils Absolute: 0.1 10*3/uL (ref 0.0–0.5)
Eosinophils Relative: 1 %
HCT: 34.8 % — ABNORMAL LOW (ref 36.0–46.0)
Hemoglobin: 11.6 g/dL — ABNORMAL LOW (ref 12.0–15.0)
Immature Granulocytes: 0 %
Lymphocytes Relative: 34 %
Lymphs Abs: 3.6 10*3/uL (ref 0.7–4.0)
MCH: 30.9 pg (ref 26.0–34.0)
MCHC: 33.3 g/dL (ref 30.0–36.0)
MCV: 92.8 fL (ref 80.0–100.0)
Monocytes Absolute: 0.7 10*3/uL (ref 0.1–1.0)
Monocytes Relative: 7 %
Neutro Abs: 6.1 10*3/uL (ref 1.7–7.7)
Neutrophils Relative %: 57 %
Platelet Count: 274 10*3/uL (ref 150–400)
RBC: 3.75 MIL/uL — ABNORMAL LOW (ref 3.87–5.11)
RDW: 13.6 % (ref 11.5–15.5)
WBC Count: 10.6 10*3/uL — ABNORMAL HIGH (ref 4.0–10.5)
nRBC: 0 % (ref 0.0–0.2)

## 2020-07-11 MED ORDER — IOHEXOL 300 MG/ML  SOLN
100.0000 mL | Freq: Once | INTRAMUSCULAR | Status: AC | PRN
  Administered 2020-07-11: 100 mL via INTRAVENOUS

## 2020-07-12 ENCOUNTER — Other Ambulatory Visit: Payer: Self-pay

## 2020-07-12 ENCOUNTER — Inpatient Hospital Stay (HOSPITAL_BASED_OUTPATIENT_CLINIC_OR_DEPARTMENT_OTHER): Payer: Medicaid Other | Admitting: Internal Medicine

## 2020-07-12 VITALS — BP 149/72 | HR 66 | Temp 98.6°F | Resp 17 | Ht 70.0 in | Wt 295.2 lb

## 2020-07-12 DIAGNOSIS — Z5111 Encounter for antineoplastic chemotherapy: Secondary | ICD-10-CM | POA: Diagnosis not present

## 2020-07-12 DIAGNOSIS — C3490 Malignant neoplasm of unspecified part of unspecified bronchus or lung: Secondary | ICD-10-CM | POA: Diagnosis not present

## 2020-07-12 DIAGNOSIS — C3491 Malignant neoplasm of unspecified part of right bronchus or lung: Secondary | ICD-10-CM | POA: Diagnosis not present

## 2020-07-12 NOTE — Progress Notes (Signed)
[place] [phone]   [phone]  OFFICE PROGRESS NOTE  [name], PA-C [place] [address]  DIAGNOSIS: Stage IV (T3, N2, M1c) non-small cell lung cancer, adenocarcinoma with ALK gene translocation presented with multiple bilateral pulmonary nodules and masses as well as small mediastinal and bilateral axillary lymphadenopathy and metastatic disease to the brain diagnosed in March 2021.  PRIOR THERAPY: None  CURRENT THERAPY: Alecensa (Alectinib) 600 mg p.o. twice daily.  First dose started on July 23 2019.  Status post 12 months of treatment.  INTERVAL HISTORY: Sarah Chandler 35 y.o. female returns to the clinic today for follow-up visit accompanied by her cousin.  The patient is feeling fine today with no concerning complaints except for fatigue.  She denied having any current chest pain, shortness of breath, cough or hemoptysis.  She denied having any nausea, vomiting, diarrhea or constipation.  She has no headache or visual changes.  She gained few pounds since her last visit.  She has no fever or chills.  The patient has been tolerating her treatment with Alecensa fairly well.  She had repeat CT scan of the chest, abdomen pelvis performed recently and she is here for evaluation and discussion of her risk her results.   MEDICAL HISTORY: Past Medical History:  Diagnosis Date  . nscl ca dx'd 06/2019  . Pneumonia 05/08/2019    ALLERGIES:  is allergic to iron.  MEDICATIONS:  Current Outpatient Medications  Medication Sig Dispense Refill  . acetaminophen (TYLENOL) 500 MG tablet Take 1,000 mg by mouth every 6 (six) hours as needed for moderate pain or headache.    . albuterol (VENTOLIN HFA) 108 (90 Base) MCG/ACT inhaler Inhale 2 puffs into the lungs every 6 (six) hours as needed for wheezing or shortness of breath. 8 g 1  . alectinib (ALECENSA) 150 MG capsule Take 4 capsules (600 mg total) by mouth 2 (two)  times daily with a meal. 240 capsule 5  . alectinib (ALECENSA) 150 MG capsule TAKE 4 CAPSULES (600 MG TOTAL) BY MOUTH 2 (TWO) TIMES DAILY WITH A MEAL. 240 capsule 5  . ergocalciferol (VITAMIN D2) 1.25 MG (50000 UT) capsule Take 1 capsule (50,000 Units total) by mouth 2 (two) times a week. 26 capsule 2  . hydrocortisone cream 1 % Apply 1 application topically 2 (two) times daily. 30 g 0  . hydrOXYzine (ATARAX/VISTARIL) 25 MG tablet Take 1 tablet (25 mg total) by mouth 3 (three) times daily as needed for anxiety. **NEEDS APT FOR FURTHER REFILL** 30 tablet 0  . levothyroxine (SYNTHROID) 75 MCG tablet Take 1 tablet (75 mcg total) by mouth daily. 90 tablet 3  . Multiple Vitamin (MULTIVITAMIN WITH MINERALS) TABS tablet Take 2 tablets by mouth daily. Gummies     No current facility-administered medications for this visit.    SURGICAL HISTORY:  Past Surgical History:  Procedure Laterality Date  . BIOPSY  07/03/2019   Procedure: BIOPSY;  Surgeon: Rigoberto Noel, MD;  Location: [facility] ENDOSCOPY;  Service: Cardiopulmonary;;  . BRONCHIAL BRUSHINGS  07/03/2019   Procedure: BRONCHIAL BRUSHINGS;  Surgeon: Rigoberto Noel, MD;  Location: [facility] ENDOSCOPY;  Service: Cardiopulmonary;;  . BRONCHIAL WASHINGS  07/03/2019   Procedure: BRONCHIAL WASHINGS;  Surgeon: Rigoberto Noel, MD;  Location: [facility] ENDOSCOPY;  Service: Cardiopulmonary;;  . NO PAST SURGERIES    . TOOTH EXTRACTION    . VIDEO BRONCHOSCOPY N/A 07/03/2019   Procedure: VIDEO BRONCHOSCOPY WITH FLUORO;  Surgeon: Rigoberto Noel, MD;  Location: [place];  Service: Cardiopulmonary;  Laterality: N/A;  LMA    REVIEW OF SYSTEMS:  Constitutional: positive for fatigue Eyes: negative Ears, nose, mouth, throat, and face: negative Respiratory: negative Cardiovascular: negative Gastrointestinal: negative Genitourinary:negative Integument/breast: negative Hematologic/lymphatic: negative Musculoskeletal:negative Neurological: negative Behavioral/Psych:  negative Endocrine: negative Allergic/Immunologic: negative   PHYSICAL EXAMINATION: General appearance: alert, cooperative, fatigued and no distress Head: Normocephalic, without obvious abnormality, atraumatic Neck: no adenopathy, no JVD, supple, symmetrical, trachea midline and thyroid not enlarged, symmetric, no tenderness/mass/nodules Lymph nodes: Cervical, supraclavicular, and axillary nodes normal. Resp: clear to auscultation bilaterally Back: symmetric, no curvature. ROM normal. No CVA tenderness. Cardio: regular rate and rhythm, S1, S2 normal, no murmur, click, rub or gallop GI: soft, non-tender; bowel sounds normal; no masses,  no organomegaly Extremities: extremities normal, atraumatic, no cyanosis or edema Neurologic: Alert and oriented X 3, normal strength and tone. Normal symmetric reflexes. Normal coordination and gait  ECOG PERFORMANCE STATUS: 0 - Asymptomatic  Blood pressure (!) 149/72, pulse 66, temperature 98.6 F (37 C), temperature source Tympanic, resp. rate 17, height '5\' 10"'  (1.778 m), weight 295 lb 3.2 oz (133.9 kg), last menstrual period 06/27/2020, SpO2 100 %.  LABORATORY DATA: Lab Results  Component Value Date   WBC 10.6 (H) 07/11/2020   HGB 11.6 (L) 07/11/2020   HCT 34.8 (L) 07/11/2020   MCV 92.8 07/11/2020   PLT 274 07/11/2020      Chemistry      Component Value Date/Time   NA 140 07/11/2020 1249   NA 139 05/19/2019 1146   K 3.9 07/11/2020 1249   CL 103 07/11/2020 1249   CO2 25 07/11/2020 1249   BUN 13 07/11/2020 1249   BUN 5 (L) 05/19/2019 1146   CREATININE 0.81 07/11/2020 1249      Component Value Date/Time   CALCIUM 8.7 (L) 07/11/2020 1249   ALKPHOS 145 (H) 07/11/2020 1249   AST 22 07/11/2020 1249   ALT 26 07/11/2020 1249   BILITOT 0.8 07/11/2020 1249       RADIOGRAPHIC STUDIES: CT Chest W Contrast  Result Date: 07/12/2020 CLINICAL DATA:  Primary Cancer Type: Lung Imaging Indication: Assess response to therapy Interval therapy since  last imaging? Yes Initial Cancer Diagnosis Date: 07/03/2019; Established by: Biopsy-proven Detailed Pathology: Stage IV non-small cell lung cancer, poorly differentiated adenocarcinoma with signet ring features. Primary Tumor location: Multiple bilateral pulmonary nodules and masses. Metastatic disease to the brain. Surgeries: None. Chemotherapy: Yes; Ongoing? Yes; Alecensa, daily, started 07/23/2019. Immunotherapy? No Radiation therapy? No EXAM: CT CHEST, ABDOMEN, AND PELVIS WITH CONTRAST TECHNIQUE: Multidetector CT imaging of the chest, abdomen and pelvis was performed following the standard protocol during bolus administration of intravenous contrast. CONTRAST:  159m OMNIPAQUE IOHEXOL 300 MG/ML SOLN, additional oral enteric contrast COMPARISON:  Most recent CT chest, abdomen and pelvis 03/07/2020. 08/05/2019 PET-CT. FINDINGS: CT CHEST FINDINGS Cardiovascular: No significant vascular findings. Normal heart size. No pericardial effusion. Mediastinum/Nodes: No enlarged mediastinal, hilar, or axillary lymph nodes. Thyroid gland, trachea, and esophagus demonstrate no significant findings. Lungs/Pleura: Stable appearance of diffuse interlobular septal thickening throughout the lungs and tiny scattered residual nodules, for example in the right pulmonary apex and irregular ground-glass nodule measuring no greater than 5 mm (series 4, image 36), in the left apex a small nodule measuring 4 mm (series 4, image 35), and in the right lung base a 4 mm nodule (series 4, image 116). No pleural effusion or pneumothorax. Musculoskeletal: No chest wall mass. CT ABDOMEN  PELVIS FINDINGS Hepatobiliary: No solid liver abnormality is seen. No gallstones, gallbladder wall thickening, or biliary dilatation. Pancreas: Unremarkable. No pancreatic ductal dilatation or surrounding inflammatory changes. Spleen: Normal in size without significant abnormality. Adrenals/Urinary Tract: Adrenal glands are unremarkable. Kidneys are normal, without  renal calculi, solid lesion, or hydronephrosis. Bladder is unremarkable. Stomach/Bowel: Stomach is within normal limits. Appendix appears normal. No evidence of bowel wall thickening, distention, or inflammatory changes. Generally large burden of stool throughout the colon and rectum. Vascular/Lymphatic: No significant vascular findings are present. No enlarged abdominal or pelvic lymph nodes. Reproductive: No mass or other abnormality. Corpus luteum cyst of the right ovary. Other: No abdominal wall hernia or abnormality. No abdominopelvic ascites. Musculoskeletal: Numerous unchanged sclerotic foci throughout the included skeleton. IMPRESSION: 1. Stable appearance of diffuse interlobular septal thickening throughout the lungs and tiny scattered residual nodules, consistent with treated lymphangitic pulmonary metastatic disease. 2. Numerous unchanged sclerotic metastatic foci throughout the included skeleton. 3. No evidence of new metastatic disease in the chest, abdomen, or pelvis. 4. Generally large burden of stool throughout the colon and rectum. Electronically Signed   By: Eddie Candle M.D.   On: 07/12/2020 10:40   CT Abdomen Pelvis W Contrast  Result Date: 07/12/2020 CLINICAL DATA:  Primary Cancer Type: Lung Imaging Indication: Assess response to therapy Interval therapy since last imaging? Yes Initial Cancer Diagnosis Date: 07/03/2019; Established by: Biopsy-proven Detailed Pathology: Stage IV non-small cell lung cancer, poorly differentiated adenocarcinoma with signet ring features. Primary Tumor location: Multiple bilateral pulmonary nodules and masses. Metastatic disease to the brain. Surgeries: None. Chemotherapy: Yes; Ongoing? Yes; Alecensa, daily, started 07/23/2019. Immunotherapy? No Radiation therapy? No EXAM: CT CHEST, ABDOMEN, AND PELVIS WITH CONTRAST TECHNIQUE: Multidetector CT imaging of the chest, abdomen and pelvis was performed following the standard protocol during bolus administration of  intravenous contrast. CONTRAST:  175m OMNIPAQUE IOHEXOL 300 MG/ML SOLN, additional oral enteric contrast COMPARISON:  Most recent CT chest, abdomen and pelvis 03/07/2020. 08/05/2019 PET-CT. FINDINGS: CT CHEST FINDINGS Cardiovascular: No significant vascular findings. Normal heart size. No pericardial effusion. Mediastinum/Nodes: No enlarged mediastinal, hilar, or axillary lymph nodes. Thyroid gland, trachea, and esophagus demonstrate no significant findings. Lungs/Pleura: Stable appearance of diffuse interlobular septal thickening throughout the lungs and tiny scattered residual nodules, for example in the right pulmonary apex and irregular ground-glass nodule measuring no greater than 5 mm (series 4, image 36), in the left apex a small nodule measuring 4 mm (series 4, image 35), and in the right lung base a 4 mm nodule (series 4, image 116). No pleural effusion or pneumothorax. Musculoskeletal: No chest wall mass. CT ABDOMEN PELVIS FINDINGS Hepatobiliary: No solid liver abnormality is seen. No gallstones, gallbladder wall thickening, or biliary dilatation. Pancreas: Unremarkable. No pancreatic ductal dilatation or surrounding inflammatory changes. Spleen: Normal in size without significant abnormality. Adrenals/Urinary Tract: Adrenal glands are unremarkable. Kidneys are normal, without renal calculi, solid lesion, or hydronephrosis. Bladder is unremarkable. Stomach/Bowel: Stomach is within normal limits. Appendix appears normal. No evidence of bowel wall thickening, distention, or inflammatory changes. Generally large burden of stool throughout the colon and rectum. Vascular/Lymphatic: No significant vascular findings are present. No enlarged abdominal or pelvic lymph nodes. Reproductive: No mass or other abnormality. Corpus luteum cyst of the right ovary. Other: No abdominal wall hernia or abnormality. No abdominopelvic ascites. Musculoskeletal: Numerous unchanged sclerotic foci throughout the included skeleton.  IMPRESSION: 1. Stable appearance of diffuse interlobular septal thickening throughout the lungs and tiny scattered residual nodules, consistent with treated lymphangitic pulmonary metastatic  disease. 2. Numerous unchanged sclerotic metastatic foci throughout the included skeleton. 3. No evidence of new metastatic disease in the chest, abdomen, or pelvis. 4. Generally large burden of stool throughout the colon and rectum. Electronically Signed   By: Eddie Candle M.D.   On: 07/12/2020 10:40    ASSESSMENT AND PLAN: This is a very pleasant 35 years old white female recently diagnosed with stage IV non-small cell lung cancer, adenocarcinoma with positive ALK gene translocation in March 2021.  The patient started treatment with Alecensa on July 23, 2019 and within few days she started not significant improvement in her condition with less shortness of breath as well as decrease in supraclavicular lymphadenopathy.  She is currently on treatment with Alecensa 600 mg p.o. twice daily status post 12 months of treatment. The patient has been tolerating this treatment well with no concerning adverse effects except for fatigue and weight gain. She had repeat CT scan of the chest, abdomen pelvis performed recently.  I personally and independently reviewed the scans and discussed the results with the patient and her cousin today. Her scan showed no concerning findings for disease progression. I recommended for her to continue her current treatment with Alecensa 600 mg p.o. twice daily. I will see her back for follow-up visit in 2 months for evaluation and repeat blood work. The patient was advised to call immediately if she has any concerning symptoms in the interval. The patient voices understanding of current disease status and treatment options and is in agreement with the current care plan.  All questions were answered. The patient knows to call the clinic with any problems, questions or concerns. We can certainly  see the patient much sooner if necessary.  Disclaimer: This note was dictated with voice recognition software. Similar sounding words can inadvertently be transcribed and may not be corrected upon review.

## 2020-07-14 ENCOUNTER — Telehealth: Payer: Self-pay | Admitting: Internal Medicine

## 2020-07-14 NOTE — Telephone Encounter (Signed)
Scheduled per los. Called and spoke with patient. Confirmed appt 

## 2020-08-16 ENCOUNTER — Other Ambulatory Visit (HOSPITAL_COMMUNITY): Payer: Self-pay

## 2020-08-16 ENCOUNTER — Telehealth: Payer: Self-pay | Admitting: Internal Medicine

## 2020-08-16 NOTE — Telephone Encounter (Signed)
R/s per prov request, PT aware.

## 2020-09-06 ENCOUNTER — Other Ambulatory Visit (HOSPITAL_COMMUNITY): Payer: Self-pay

## 2020-09-06 MED FILL — Alectinib HCl Cap 150 MG (Base Equivalent): ORAL | 30 days supply | Qty: 240 | Fill #0 | Status: AC

## 2020-09-12 ENCOUNTER — Other Ambulatory Visit: Payer: Medicaid Other

## 2020-09-12 ENCOUNTER — Ambulatory Visit: Payer: Medicaid Other | Admitting: Internal Medicine

## 2020-09-14 ENCOUNTER — Other Ambulatory Visit (HOSPITAL_COMMUNITY): Payer: Self-pay

## 2020-09-19 ENCOUNTER — Other Ambulatory Visit: Payer: Self-pay

## 2020-09-19 ENCOUNTER — Inpatient Hospital Stay (HOSPITAL_BASED_OUTPATIENT_CLINIC_OR_DEPARTMENT_OTHER): Payer: Medicaid Other | Admitting: Internal Medicine

## 2020-09-19 ENCOUNTER — Encounter: Payer: Self-pay | Admitting: Internal Medicine

## 2020-09-19 ENCOUNTER — Inpatient Hospital Stay: Payer: Medicaid Other | Attending: Internal Medicine

## 2020-09-19 VITALS — BP 133/63 | HR 59 | Temp 97.4°F | Resp 20 | Ht 70.0 in | Wt 287.2 lb

## 2020-09-19 DIAGNOSIS — R0602 Shortness of breath: Secondary | ICD-10-CM | POA: Insufficient documentation

## 2020-09-19 DIAGNOSIS — C3491 Malignant neoplasm of unspecified part of right bronchus or lung: Secondary | ICD-10-CM | POA: Diagnosis not present

## 2020-09-19 DIAGNOSIS — C7931 Secondary malignant neoplasm of brain: Secondary | ICD-10-CM | POA: Insufficient documentation

## 2020-09-19 DIAGNOSIS — C3492 Malignant neoplasm of unspecified part of left bronchus or lung: Secondary | ICD-10-CM

## 2020-09-19 DIAGNOSIS — D649 Anemia, unspecified: Secondary | ICD-10-CM | POA: Diagnosis not present

## 2020-09-19 DIAGNOSIS — Z79899 Other long term (current) drug therapy: Secondary | ICD-10-CM | POA: Diagnosis not present

## 2020-09-19 DIAGNOSIS — Z5111 Encounter for antineoplastic chemotherapy: Secondary | ICD-10-CM

## 2020-09-19 DIAGNOSIS — C3490 Malignant neoplasm of unspecified part of unspecified bronchus or lung: Secondary | ICD-10-CM

## 2020-09-19 DIAGNOSIS — R5383 Other fatigue: Secondary | ICD-10-CM | POA: Diagnosis not present

## 2020-09-19 LAB — CBC WITH DIFFERENTIAL (CANCER CENTER ONLY)
Abs Immature Granulocytes: 0.01 10*3/uL (ref 0.00–0.07)
Basophils Absolute: 0 10*3/uL (ref 0.0–0.1)
Basophils Relative: 1 %
Eosinophils Absolute: 0.1 10*3/uL (ref 0.0–0.5)
Eosinophils Relative: 1 %
HCT: 33.4 % — ABNORMAL LOW (ref 36.0–46.0)
Hemoglobin: 11.5 g/dL — ABNORMAL LOW (ref 12.0–15.0)
Immature Granulocytes: 0 %
Lymphocytes Relative: 34 %
Lymphs Abs: 2.7 10*3/uL (ref 0.7–4.0)
MCH: 30.9 pg (ref 26.0–34.0)
MCHC: 34.4 g/dL (ref 30.0–36.0)
MCV: 89.8 fL (ref 80.0–100.0)
Monocytes Absolute: 0.6 10*3/uL (ref 0.1–1.0)
Monocytes Relative: 7 %
Neutro Abs: 4.6 10*3/uL (ref 1.7–7.7)
Neutrophils Relative %: 57 %
Platelet Count: 262 10*3/uL (ref 150–400)
RBC: 3.72 MIL/uL — ABNORMAL LOW (ref 3.87–5.11)
RDW: 13.6 % (ref 11.5–15.5)
WBC Count: 8 10*3/uL (ref 4.0–10.5)
nRBC: 0 % (ref 0.0–0.2)

## 2020-09-19 LAB — CMP (CANCER CENTER ONLY)
ALT: 20 U/L (ref 0–44)
AST: 17 U/L (ref 15–41)
Albumin: 3.8 g/dL (ref 3.5–5.0)
Alkaline Phosphatase: 145 U/L — ABNORMAL HIGH (ref 38–126)
Anion gap: 11 (ref 5–15)
BUN: 9 mg/dL (ref 6–20)
CO2: 23 mmol/L (ref 22–32)
Calcium: 9.3 mg/dL (ref 8.9–10.3)
Chloride: 107 mmol/L (ref 98–111)
Creatinine: 0.75 mg/dL (ref 0.44–1.00)
GFR, Estimated: >60 mL/min (ref >60)
Glucose, Bld: 99 mg/dL (ref 70–99)
Potassium: 4.1 mmol/L (ref 3.5–5.1)
Sodium: 141 mmol/L (ref 135–145)
Total Bilirubin: 1.1 mg/dL (ref 0.3–1.2)
Total Protein: 7.4 g/dL (ref 6.5–8.1)

## 2020-09-19 LAB — CK: Total CK: 84 U/L (ref 38–234)

## 2020-09-19 NOTE — Progress Notes (Signed)
[name] [phone]   Fax:(336) [phone]  OFFICE PROGRESS NOTE  [name], PA-C [name]  DIAGNOSIS: Stage IV (T3, N2, M1c) non-small cell lung cancer, adenocarcinoma with ALK gene translocation presented with multiple bilateral pulmonary nodules and masses as well as small mediastinal and bilateral axillary lymphadenopathy and metastatic disease to the brain diagnosed in March 2021.  PRIOR THERAPY: None  CURRENT THERAPY: Alecensa (Alectinib) 600 mg p.o. twice daily.  First dose started on July 23 2019.  Status post 14 months of treatment.  INTERVAL HISTORY: Sarah Chandler 35 y.o. female returns to the clinic today for follow-up visit accompanied by her cousin.  The patient has no complaints today but continues to gain weight.  She denied having any chest pain, shortness of breath, cough or hemoptysis.  She denied having any fever or chills.  She has no nausea, vomiting, diarrhea or constipation.  She has no headache or visual changes.  She continues to tolerate her treatment with Alecensa fairly well.  The patient is here today for evaluation and repeat blood work.  MEDICAL HISTORY: Past Medical History:  Diagnosis Date   nscl ca dx'd 06/2019   Pneumonia 05/08/2019    ALLERGIES:  is allergic to iron.  MEDICATIONS:  Current Outpatient Medications  Medication Sig Dispense Refill   acetaminophen (TYLENOL) 500 MG tablet Take 1,000 mg by mouth every 6 (six) hours as needed for moderate pain or headache.     albuterol (VENTOLIN HFA) 108 (90 Base) MCG/ACT inhaler Inhale 2 puffs into the lungs every 6 (six) hours as needed for wheezing or shortness of breath. 8 g 1   alectinib (ALECENSA) 150 MG capsule Take 4 capsules (600 mg total) by mouth 2 (two) times daily with a meal. 240 capsule 5   alectinib (ALECENSA) 150 MG capsule TAKE 4 CAPSULES (600 MG TOTAL) BY MOUTH 2 (TWO) TIMES DAILY WITH A MEAL. 240 capsule  5   ergocalciferol (VITAMIN D2) 1.25 MG (50000 UT) capsule Take 1 capsule (50,000 Units total) by mouth 2 (two) times a week. 26 capsule 2   hydrocortisone cream 1 % Apply 1 application topically 2 (two) times daily. 30 g 0   hydrOXYzine (ATARAX/VISTARIL) 25 MG tablet Take 1 tablet (25 mg total) by mouth 3 (three) times daily as needed for anxiety. **NEEDS APT FOR FURTHER REFILL** 30 tablet 0   levothyroxine (SYNTHROID) 75 MCG tablet Take 1 tablet (75 mcg total) by mouth daily. 90 tablet 3   Multiple Vitamin (MULTIVITAMIN WITH MINERALS) TABS tablet Take 2 tablets by mouth daily. Gummies     No current facility-administered medications for this visit.    SURGICAL HISTORY:  Past Surgical History:  Procedure Laterality Date   BIOPSY  07/03/2019   Procedure: BIOPSY;  Surgeon: Rigoberto Noel, MD;  Location: [facility] ENDOSCOPY;  Service: Cardiopulmonary;;   BRONCHIAL BRUSHINGS  07/03/2019   Procedure: BRONCHIAL BRUSHINGS;  Surgeon: Rigoberto Noel, MD;  Location: [place];  Service: Cardiopulmonary;;   BRONCHIAL WASHINGS  07/03/2019   Procedure: BRONCHIAL WASHINGS;  Surgeon: Rigoberto Noel, MD;  Location: [place];  Service: Cardiopulmonary;;   NO PAST SURGERIES     TOOTH EXTRACTION     VIDEO BRONCHOSCOPY N/A 07/03/2019   Procedure: VIDEO BRONCHOSCOPY WITH FLUORO;  Surgeon: Rigoberto Noel, MD;  Location: [place];  Service: Cardiopulmonary;  Laterality: N/A;  LMA    REVIEW OF SYSTEMS:  A comprehensive review  of systems was negative except for: Constitutional: positive for fatigue   PHYSICAL EXAMINATION: General appearance: alert, cooperative, fatigued, and no distress Head: Normocephalic, without obvious abnormality, atraumatic Neck: no adenopathy, no JVD, supple, symmetrical, trachea midline, and thyroid not enlarged, symmetric, no tenderness/mass/nodules Lymph nodes: Cervical, supraclavicular, and axillary nodes normal. Resp: clear to auscultation bilaterally Back: symmetric, no  curvature. ROM normal. No CVA tenderness. Cardio: regular rate and rhythm, S1, S2 normal, no murmur, click, rub or gallop GI: soft, non-tender; bowel sounds normal; no masses,  no organomegaly Extremities: extremities normal, atraumatic, no cyanosis or edema  ECOG PERFORMANCE STATUS: 1 - Symptomatic but completely ambulatory  Blood pressure 133/63, pulse (!) 59, temperature (!) 97.4 F (36.3 C), temperature source Tympanic, resp. rate 20, height '5\' 10"'  (1.778 m), weight 287 lb 3.2 oz (130.3 kg), SpO2 99 %.  LABORATORY DATA: Lab Results  Component Value Date   WBC 8.0 09/19/2020   HGB 11.5 (L) 09/19/2020   HCT 33.4 (L) 09/19/2020   MCV 89.8 09/19/2020   PLT 262 09/19/2020      Chemistry      Component Value Date/Time   NA 140 07/11/2020 1249   NA 139 05/19/2019 1146   K 3.9 07/11/2020 1249   CL 103 07/11/2020 1249   CO2 25 07/11/2020 1249   BUN 13 07/11/2020 1249   BUN 5 (L) 05/19/2019 1146   CREATININE 0.81 07/11/2020 1249      Component Value Date/Time   CALCIUM 8.7 (L) 07/11/2020 1249   ALKPHOS 145 (H) 07/11/2020 1249   AST 22 07/11/2020 1249   ALT 26 07/11/2020 1249   BILITOT 0.8 07/11/2020 1249       RADIOGRAPHIC STUDIES: No results found.   ASSESSMENT AND PLAN: This is a very pleasant 35 years old white female recently diagnosed with stage IV non-small cell lung cancer, adenocarcinoma with positive ALK gene translocation in March 2021.  The patient started treatment with Alecensa on July 23, 2019 and within few days she started not significant improvement in her condition with less shortness of breath as well as decrease in supraclavicular lymphadenopathy.  She is currently on treatment with Alecensa 600 mg p.o. twice daily status post 14 months of treatment. She continues to tolerate this treatment well with no concerning adverse effect except for the persistent weight gain and she also has a history of depression and seeing her new family doctor soon. Her lab  work today showed CBC is unremarkable except for the mild anemia.  Comprehensive metabolic panel is still pending I recommended for the patient to continue on her current treatment with Alecensa with the same dose. I will see her back for follow-up visit in 2 months for evaluation with repeat CT scan of the chest, abdomen pelvis as well as MRI of the brain. The patient was advised to call immediately if she has any other concerning symptoms in the interval.  The patient voices understanding of current disease status and treatment options and is in agreement with the current care plan.  All questions were answered. The patient knows to call the clinic with any problems, questions or concerns. We can certainly see the patient much sooner if necessary.  Disclaimer: This note was dictated with voice recognition software. Similar sounding words can inadvertently be transcribed and may not be corrected upon review.

## 2020-09-23 ENCOUNTER — Telehealth: Payer: Self-pay | Admitting: Internal Medicine

## 2020-09-23 ENCOUNTER — Ambulatory Visit: Payer: Medicaid Other | Admitting: Internal Medicine

## 2020-09-23 NOTE — Telephone Encounter (Signed)
Scheduled per los. Called and spoke with patient. Confirmed appt 

## 2020-09-28 ENCOUNTER — Other Ambulatory Visit (HOSPITAL_COMMUNITY): Payer: Self-pay

## 2020-09-28 MED FILL — Alectinib HCl Cap 150 MG (Base Equivalent): ORAL | 30 days supply | Qty: 240 | Fill #1 | Status: AC

## 2020-09-30 ENCOUNTER — Other Ambulatory Visit: Payer: Self-pay

## 2020-09-30 ENCOUNTER — Encounter: Payer: Self-pay | Admitting: Internal Medicine

## 2020-09-30 ENCOUNTER — Ambulatory Visit (INDEPENDENT_AMBULATORY_CARE_PROVIDER_SITE_OTHER): Payer: Medicaid Other | Admitting: Internal Medicine

## 2020-09-30 VITALS — BP 110/80 | HR 84 | Ht 70.0 in | Wt 289.2 lb

## 2020-09-30 DIAGNOSIS — E039 Hypothyroidism, unspecified: Secondary | ICD-10-CM

## 2020-09-30 DIAGNOSIS — E559 Vitamin D deficiency, unspecified: Secondary | ICD-10-CM | POA: Diagnosis not present

## 2020-09-30 DIAGNOSIS — Z8639 Personal history of other endocrine, nutritional and metabolic disease: Secondary | ICD-10-CM | POA: Diagnosis not present

## 2020-09-30 LAB — TSH: TSH: 2.29 u[IU]/mL (ref 0.35–4.50)

## 2020-09-30 LAB — VITAMIN D 25 HYDROXY (VIT D DEFICIENCY, FRACTURES): VITD: 49.93 ng/mL (ref 30.00–100.00)

## 2020-09-30 MED ORDER — ERGOCALCIFEROL 1.25 MG (50000 UT) PO CAPS
50000.0000 [IU] | ORAL_CAPSULE | ORAL | 2 refills | Status: DC
Start: 2020-10-03 — End: 2021-08-02

## 2020-09-30 MED ORDER — LEVOTHYROXINE SODIUM 75 MCG PO TABS
75.0000 ug | ORAL_TABLET | Freq: Every day | ORAL | 3 refills | Status: DC
Start: 2020-09-30 — End: 2021-01-30

## 2020-09-30 NOTE — Progress Notes (Signed)
Name: Sarah Chandler  MRN/ DOB: [id number], 04/04/1986    Age/ Sex: 35 y.o., female     PCP: [name], PA-C   Reason for Endocrinology Evaluation: Hyperthyroidism     Initial Endocrinology Clinic Visit: 06/08/2019    PATIENT IDENTIFIER: 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 She has followed with [facility] since 06/08/2019 for consultative assistance with management of her hyperthyroidism.   HISTORICAL SUMMARY:  Pt presented to the ED on 05/10/2019 with palpitations and fatigue. She was noted to have a suppressed TSH < 0.005 uIU/mL with elevated FT4 at 1.70 ng/dL .She was started on Methimazole at the time.   In the meantime she was diagnosed with metastatic lung ca and was on Alectinib. TSH trended up to 91 uIU/mL by 09/2019 and methimazole was stopped.   LT-4 replacement was started 12/2019  No FH of thyroid disease SUBJECTIVE:   Today (09/30/2020):  Sarah Chandler is here for a follow up on hypothyroidism   She continues on  Alectinib started 07/2019 for metastatic lung cancer   She has been noted with weight loss  Denies any joint/muscle pains  Denies constipation Denies local neck symptoms  Continues to have fluctuating energy levels   Has been struggling with mental health issues  Levothyroxine 75 mcg daily  Ergocalciferol 50, 000 iu weekly     HISTORY:  Past Medical History:  Past Medical History:  Diagnosis Date   nscl ca dx'd 06/2019   Pneumonia 05/08/2019   Past Surgical History:  Past Surgical History:  Procedure Laterality Date   BIOPSY  07/03/2019   Procedure: BIOPSY;  Surgeon: Rigoberto Noel, MD;  Location: [place];  Service: Cardiopulmonary;;   BRONCHIAL BRUSHINGS  07/03/2019   Procedure: BRONCHIAL BRUSHINGS;  Surgeon: Rigoberto Noel, MD;  Location: [place];  Service: Cardiopulmonary;;   BRONCHIAL WASHINGS  07/03/2019    Procedure: BRONCHIAL WASHINGS;  Surgeon: Rigoberto Noel, MD;  Location: [place];  Service: Cardiopulmonary;;   NO PAST SURGERIES     TOOTH EXTRACTION     VIDEO BRONCHOSCOPY N/A 07/03/2019   Procedure: VIDEO BRONCHOSCOPY WITH FLUORO;  Surgeon: Rigoberto Noel, MD;  Location[facility] ENDOSCOPY;  Service: Cardiopulmonary;  Laterality: N/A;  LMA   Social History:  reports that she has never smoked. She has never used smokeless tobacco. She reports that she does not drink alcohol and does not use drugs. Family History:  Family History  Problem Relation Age of Onset   Diabetes Father    Hyperlipidemia Father    Hypertension Father    Depression Maternal Uncle    Diabetes Maternal Uncle    Cancer Maternal Grandmother    Hyperlipidemia Maternal Grandmother    Cancer Paternal Grandfather      HOME MEDICATIONS: Allergies as of 09/30/2020       Reactions   Iron Other (See Comments)   Ferrosol - Liquid  Unknown reaction         Medication List        Accurate as of September 30, 2020  4:53 PM. If you have any questions, ask your nurse or doctor.          acetaminophen 500 MG tablet Commonly known as: TYLENOL Take 1,000 mg by mouth every 6 (six) hours as needed for moderate pain or headache.   albuterol 108 (90 Base) MCG/ACT inhaler Commonly known as: VENTOLIN HFA Inhale 2  puffs into the lungs every 6 (six) hours as needed for wheezing or shortness of breath.   alectinib 150 MG capsule Commonly known as: ALECENSA Take 4 capsules (600 mg total) by mouth 2 (two) times daily with a meal.   Alecensa 150 MG capsule Generic drug: alectinib TAKE 4 CAPSULES (600 MG TOTAL) BY MOUTH 2 (TWO) TIMES DAILY WITH A MEAL.   ergocalciferol 1.25 MG (50000 UT) capsule Commonly known as: VITAMIN D2 Take 1 capsule (50,000 Units total) by mouth 2 (two) times a week.   hydrocortisone cream 1 % Apply 1 application topically 2 (two) times daily.   hydrOXYzine 25 MG tablet Commonly known as:  ATARAX/VISTARIL Take 1 tablet (25 mg total) by mouth 3 (three) times daily as needed for anxiety. **NEEDS APT FOR FURTHER REFILL**   levothyroxine 75 MCG tablet Commonly known as: SYNTHROID Take 1 tablet (75 mcg total) by mouth daily.   multivitamin with minerals Tabs tablet Take 2 tablets by mouth daily. Gummies          OBJECTIVE:   PHYSICAL EXAM: VS: BP 110/80   Pulse 84   Ht 5\' 10"  (1.778 m)   Wt 289 lb 3.2 oz (131.2 kg)   SpO2 98%   BMI 41.50 kg/m    EXAM: General: Pt appears well and is in NAD Right eye stare but no exophthalmos  Neck: General: Supple without adenopathy. Thyroid: Thyroid size normal.  No goiter or nodules appreciated.   Lungs: Clear with good BS bilat with no rales, rhonchi, or wheezes  Heart: Auscultation: RRR.  Abdomen: Normoactive bowel sounds, soft, nontender, without masses or organomegaly palpable  Skin: Erythematous papular and vesicular rash on both palms   Extremities:  BL LE: No pretibial edema normal ROM and strength.  Mental Status: Judgment, insight: Intact Orientation: Oriented to time, place, and person Mood and affect: No depression, anxiety, or agitation     DATA REVIEWED:  Results for Sarah Chandler, Sarah Chandler (MRN [id number]) as of 09/30/2020 16:53  Ref. Range 09/30/2020 08:43  VITD Latest Ref Range: 30.00 - 100.00 ng/mL 49.93  TSH Latest Ref Range: 0.35 - 4.50 uIU/mL 2.29    Results for Sarah Chandler, Sarah Chandler (MRN [id number]) as of 09/30/2020 08:18  Ref. Range 09/19/2020 09:37  Sodium Latest Ref Range: 135 - 145 mmol/L 141  Potassium Latest Ref Range: 3.5 - 5.1 mmol/L 4.1  Chloride Latest Ref Range: 98 - 111 mmol/L 107  CO2 Latest Ref Range: 22 - 32 mmol/L 23  Glucose Latest Ref Range: 70 - 99 mg/dL 99  BUN Latest Ref Range: 6 - 20 mg/dL 9  Creatinine Latest Ref Range: 0.44 - 1.00 mg/dL 0.75  Calcium Latest Ref Range: 8.9 - 10.3 mg/dL 9.3  Anion gap Latest Ref Range: 5 - 15  11  Alkaline Phosphatase Latest Ref Range: 38 - 126 U/L  145 (H)  Albumin Latest Ref Range: 3.5 - 5.0 g/dL 3.8  AST Latest Ref Range: 15 - 41 U/L 17  ALT Latest Ref Range: 0 - 44 U/L 20  Total Protein Latest Ref Range: 6.5 - 8.1 g/dL 7.4  Total Bilirubin Latest Ref Range: 0.3 - 1.2 mg/dL 1.1  GFR, Est Non African American Latest Ref Range: >60 mL/min >60    Results for Sarah Chandler, Sarah Chandler (MRN [id number]) as of 12/25/2019 09:03  Ref. Range 06/08/2019 14:52  TRAB Latest Ref Range: <=2.00 IU/L 5.19 (H)   ASSESSMENT / PLAN / RECOMMENDATIONS:   Hypothyroidism :    - She was initially  diagnosed with hyperthyroidism secondary to Graves' disease in 04/2019, she had to be started on Alectinib in 07/2019 for metastatic lung cancer and by 09/2019 she was off Methimazole followed by hypothyroidism . I suspect Alectinib may have to do with hypothyroidism development.  - No local neck symptoms  - TSH is normal, no changes  Medication Continue to follow-up with ophthalmology levothyroxine 75 mcg daily   2. Graves' Disease:  - She has a stare of the right eye, pt advised   3. Vitamin D deficiency:  - Repeat Vitamin D is normal  Medication   - Continue ergocalciferol 50,000 iu twice weekly      F/U in 6 months     Signed electronically by: Mack Guise, MD  [facility] End[facility]inology  [facility] [address], [place] [address] Phone: [phone] FAX: [phone]      CC: [name], PA-C [place] [address] Phone: [phone]  Fax: [phone]   Return to Endocrinology clinic as below: Future Appointments  Date Time Provider [place]  10/06/2020  9:00 AM Doreen Beam, FNP [facility]  11/18/2020  9:45 AM [facility] None  11/21/2020  9:15 AM Curt Bears, MD [facility] None  01/30/2021  8:10 AM Klea Nall, Melanie Crazier, MD [facility] None

## 2020-10-04 ENCOUNTER — Other Ambulatory Visit (HOSPITAL_COMMUNITY): Payer: Self-pay

## 2020-10-06 ENCOUNTER — Ambulatory Visit: Payer: Medicaid Other | Admitting: Adult Health

## 2020-10-27 ENCOUNTER — Other Ambulatory Visit (HOSPITAL_COMMUNITY): Payer: Self-pay

## 2020-10-27 MED FILL — Alectinib HCl Cap 150 MG (Base Equivalent): ORAL | 30 days supply | Qty: 240 | Fill #2 | Status: AC

## 2020-11-18 ENCOUNTER — Other Ambulatory Visit: Payer: Self-pay

## 2020-11-18 ENCOUNTER — Ambulatory Visit (HOSPITAL_COMMUNITY)
Admission: RE | Admit: 2020-11-18 | Discharge: 2020-11-18 | Disposition: A | Discharge status: Home / Self Care | Payer: Medicaid Other | Source: Ambulatory Visit | Attending: Internal Medicine | Admitting: Internal Medicine

## 2020-11-18 ENCOUNTER — Inpatient Hospital Stay: Payer: Medicaid Other | Attending: Internal Medicine

## 2020-11-18 DIAGNOSIS — Z79899 Other long term (current) drug therapy: Secondary | ICD-10-CM | POA: Insufficient documentation

## 2020-11-18 DIAGNOSIS — C3492 Malignant neoplasm of unspecified part of left bronchus or lung: Secondary | ICD-10-CM | POA: Insufficient documentation

## 2020-11-18 DIAGNOSIS — C7951 Secondary malignant neoplasm of bone: Secondary | ICD-10-CM | POA: Diagnosis not present

## 2020-11-18 DIAGNOSIS — G9389 Other specified disorders of brain: Secondary | ICD-10-CM | POA: Diagnosis not present

## 2020-11-18 DIAGNOSIS — C7931 Secondary malignant neoplasm of brain: Secondary | ICD-10-CM | POA: Diagnosis present

## 2020-11-18 DIAGNOSIS — C3490 Malignant neoplasm of unspecified part of unspecified bronchus or lung: Secondary | ICD-10-CM

## 2020-11-18 DIAGNOSIS — C3491 Malignant neoplasm of unspecified part of right bronchus or lung: Secondary | ICD-10-CM | POA: Insufficient documentation

## 2020-11-18 DIAGNOSIS — R911 Solitary pulmonary nodule: Secondary | ICD-10-CM | POA: Diagnosis not present

## 2020-11-18 LAB — CBC WITH DIFFERENTIAL (CANCER CENTER ONLY)
Abs Immature Granulocytes: 0.02 10*3/uL (ref 0.00–0.07)
Basophils Absolute: 0.1 10*3/uL (ref 0.0–0.1)
Basophils Relative: 1 %
Eosinophils Absolute: 0.1 10*3/uL (ref 0.0–0.5)
Eosinophils Relative: 1 %
HCT: 33.8 % — ABNORMAL LOW (ref 36.0–46.0)
Hemoglobin: 11.4 g/dL — ABNORMAL LOW (ref 12.0–15.0)
Immature Granulocytes: 0 %
Lymphocytes Relative: 36 %
Lymphs Abs: 3.2 10*3/uL (ref 0.7–4.0)
MCH: 30.6 pg (ref 26.0–34.0)
MCHC: 33.7 g/dL (ref 30.0–36.0)
MCV: 90.9 fL (ref 80.0–100.0)
Monocytes Absolute: 0.6 10*3/uL (ref 0.1–1.0)
Monocytes Relative: 6 %
Neutro Abs: 5 10*3/uL (ref 1.7–7.7)
Neutrophils Relative %: 56 %
Platelet Count: 285 10*3/uL (ref 150–400)
RBC: 3.72 MIL/uL — ABNORMAL LOW (ref 3.87–5.11)
RDW: 13.2 % (ref 11.5–15.5)
WBC Count: 8.9 10*3/uL (ref 4.0–10.5)
nRBC: 0 % (ref 0.0–0.2)

## 2020-11-18 LAB — CMP (CANCER CENTER ONLY)
ALT: 31 U/L (ref 0–44)
AST: 27 U/L (ref 15–41)
Albumin: 3.7 g/dL (ref 3.5–5.0)
Alkaline Phosphatase: 176 U/L — ABNORMAL HIGH (ref 38–126)
Anion gap: 9 (ref 5–15)
BUN: 10 mg/dL (ref 6–20)
CO2: 25 mmol/L (ref 22–32)
Calcium: 9 mg/dL (ref 8.9–10.3)
Chloride: 106 mmol/L (ref 98–111)
Creatinine: 0.81 mg/dL (ref 0.44–1.00)
GFR, Estimated: >60 mL/min (ref >60)
Glucose, Bld: 88 mg/dL (ref 70–99)
Potassium: 4.1 mmol/L (ref 3.5–5.1)
Sodium: 140 mmol/L (ref 135–145)
Total Bilirubin: 0.9 mg/dL (ref 0.3–1.2)
Total Protein: 7.4 g/dL (ref 6.5–8.1)

## 2020-11-18 MED ORDER — GADOBUTROL 1 MMOL/ML IV SOLN
10.0000 mL | Freq: Once | INTRAVENOUS | Status: AC | PRN
  Administered 2020-11-18: 10 mL via INTRAVENOUS

## 2020-11-18 MED ORDER — IOHEXOL 350 MG/ML SOLN
100.0000 mL | Freq: Once | INTRAVENOUS | Status: AC | PRN
  Administered 2020-11-18: 100 mL via INTRAVENOUS

## 2020-11-21 ENCOUNTER — Inpatient Hospital Stay (HOSPITAL_BASED_OUTPATIENT_CLINIC_OR_DEPARTMENT_OTHER): Payer: Medicaid Other | Admitting: Internal Medicine

## 2020-11-21 ENCOUNTER — Other Ambulatory Visit: Payer: Self-pay

## 2020-11-21 VITALS — BP 146/68 | HR 76 | Temp 97.7°F | Resp 20 | Ht 70.0 in | Wt 294.0 lb

## 2020-11-21 DIAGNOSIS — C3490 Malignant neoplasm of unspecified part of unspecified bronchus or lung: Secondary | ICD-10-CM

## 2020-11-21 DIAGNOSIS — C3491 Malignant neoplasm of unspecified part of right bronchus or lung: Secondary | ICD-10-CM | POA: Diagnosis not present

## 2020-11-21 DIAGNOSIS — Z5111 Encounter for antineoplastic chemotherapy: Secondary | ICD-10-CM

## 2020-11-21 NOTE — Progress Notes (Signed)
[place] [phone]   Fax:([phone]  OFFICE PROGRESS NOTE  [name], PA-C [place] [address]  DIAGNOSIS: Stage IV (T3, N2, M1c) non-small cell lung cancer, adenocarcinoma with ALK gene translocation presented with multiple bilateral pulmonary nodules and masses as well as small mediastinal and bilateral axillary lymphadenopathy and metastatic disease to the brain diagnosed in March 2021.  PRIOR THERAPY: None  CURRENT THERAPY: Alecensa (Alectinib) 600 mg p.o. twice daily.  First dose started on July 23 2019.  Status post 16 months of treatment.  INTERVAL HISTORY: Sarah Chandler 35 y.o. female returns to the clinic today for follow-up visit accompanied by her cousin.  The patient is feeling fine today with no concerning complaints except for fatigue.  She denied having any current chest pain, shortness of breath, cough or hemoptysis.  She denied having any nausea, vomiting, diarrhea or constipation.  She has no headache or visual changes.  She continues to tolerate her treatment with Alecensa fairly well.  She had repeat CT scan of the chest, abdomen pelvis performed recently and she is here for evaluation and discussion of her risk her results.  MEDICAL HISTORY: Past Medical History:  Diagnosis Date   nscl ca dx'd 06/2019   Pneumonia 05/08/2019    ALLERGIES:  is allergic to iron.  MEDICATIONS:  Current Outpatient Medications  Medication Sig Dispense Refill   acetaminophen (TYLENOL) 500 MG tablet Take 1,000 mg by mouth every 6 (six) hours as needed for moderate pain or headache.     albuterol (VENTOLIN HFA) 108 (90 Base) MCG/ACT inhaler Inhale 2 puffs into the lungs every 6 (six) hours as needed for wheezing or shortness of breath. 8 g 1   alectinib (ALECENSA) 150 MG capsule Take 4 capsules (600 mg total) by mouth 2 (two) times daily with a meal. 240 capsule 5   alectinib (ALECENSA) 150 MG capsule TAKE 4  CAPSULES (600 MG TOTAL) BY MOUTH 2 (TWO) TIMES DAILY WITH A MEAL. 240 capsule 5   ergocalciferol (VITAMIN D2) 1.25 MG (50000 UT) capsule Take 1 capsule (50,000 Units total) by mouth 2 (two) times a week. 26 capsule 2   hydrocortisone cream 1 % Apply 1 application topically 2 (two) times daily. 30 g 0   hydrOXYzine (ATARAX/VISTARIL) 25 MG tablet Take 1 tablet (25 mg total) by mouth 3 (three) times daily as needed for anxiety. **NEEDS APT FOR FURTHER REFILL** 30 tablet 0   levothyroxine (SYNTHROID) 75 MCG tablet Take 1 tablet (75 mcg total) by mouth daily. 90 tablet 3   Multiple Vitamin (MULTIVITAMIN WITH MINERALS) TABS tablet Take 2 tablets by mouth daily. Gummies     No current facility-administered medications for this visit.    SURGICAL HISTORY:  Past Surgical History:  Procedure Laterality Date   BIOPSY  07/03/2019   Procedure: BIOPSY;  Surgeon: Rigoberto Noel, MD;  Location: [facility] ENDOSCOPY;  Service: Cardiopulmonary;;   BRONCHIAL BRUSHINGS  07/03/2019   Procedure: BRONCHIAL BRUSHINGS;  Surgeon: Rigoberto Noel, MD;  Location: [place];  Service: Cardiopulmonary;;   BRONCHIAL WASHINGS  07/03/2019   Procedure: BRONCHIAL WASHINGS;  Surgeon: Rigoberto Noel, MD;  Location: [place];  Service: Cardiopulmonary;;   NO PAST SURGERIES     TOOTH EXTRACTION     VIDEO BRONCHOSCOPY N/A 07/03/2019   Procedure: VIDEO BRONCHOSCOPY WITH FLUORO;  Surgeon: Rigoberto Noel, MD;  Location: [place];  Service: Cardiopulmonary;  Laterality: N/A;  LMA  REVIEW OF SYSTEMS:  Constitutional: positive for fatigue Eyes: negative Ears, nose, mouth, throat, and face: negative Respiratory: negative Cardiovascular: negative Gastrointestinal: negative Genitourinary:negative Integument/breast: negative Hematologic/lymphatic: negative Musculoskeletal:negative Neurological: negative Behavioral/Psych: negative Endocrine: negative Allergic/Immunologic: negative   PHYSICAL EXAMINATION: General appearance:  alert, cooperative, fatigued, and no distress Head: Normocephalic, without obvious abnormality, atraumatic Neck: no adenopathy, no JVD, supple, symmetrical, trachea midline, and thyroid not enlarged, symmetric, no tenderness/mass/nodules Lymph nodes: Cervical, supraclavicular, and axillary nodes normal. Resp: clear to auscultation bilaterally Back: symmetric, no curvature. ROM normal. No CVA tenderness. Cardio: regular rate and rhythm, S1, S2 normal, no murmur, click, rub or gallop GI: soft, non-tender; bowel sounds normal; no masses,  no organomegaly Extremities: extremities normal, atraumatic, no cyanosis or edema Neurologic: Alert and oriented X 3, normal strength and tone. Normal symmetric reflexes. Normal coordination and gait  ECOG PERFORMANCE STATUS: 1 - Symptomatic but completely ambulatory  Blood pressure (!) 146/68, pulse 76, temperature 97.7 F (36.5 C), temperature source Tympanic, resp. rate 20, height '5\' 10"'  (1.778 m), weight 294 lb (133.4 kg), last menstrual period 11/07/2020, SpO2 98 %.  LABORATORY DATA: Lab Results  Component Value Date   WBC 8.9 11/18/2020   HGB 11.4 (L) 11/18/2020   HCT 33.8 (L) 11/18/2020   MCV 90.9 11/18/2020   PLT 285 11/18/2020      Chemistry      Component Value Date/Time   NA 140 11/18/2020 1011   NA 139 05/19/2019 1146   K 4.1 11/18/2020 1011   CL 106 11/18/2020 1011   CO2 25 11/18/2020 1011   BUN 10 11/18/2020 1011   BUN 5 (L) 05/19/2019 1146   CREATININE 0.81 11/18/2020 1011      Component Value Date/Time   CALCIUM 9.0 11/18/2020 1011   ALKPHOS 176 (H) 11/18/2020 1011   AST 27 11/18/2020 1011   ALT 31 11/18/2020 1011   BILITOT 0.9 11/18/2020 1011       RADIOGRAPHIC STUDIES: CT Chest W Contrast  Result Date: 11/20/2020 CLINICAL DATA:  35 year old female with history of non-small cell lung cancer. Ongoing oral chemotherapy. Follow-up study. EXAM: CT CHEST, ABDOMEN, AND PELVIS WITH CONTRAST TECHNIQUE: Multidetector CT  imaging of the chest, abdomen and pelvis was performed following the standard protocol during bolus administration of intravenous contrast. CONTRAST:  172m OMNIPAQUE IOHEXOL 350 MG/ML SOLN COMPARISON:  CT the chest, abdomen and pelvis 07/11/2020. FINDINGS: CT CHEST FINDINGS Cardiovascular: Heart size is normal. There is no significant pericardial fluid, thickening or pericardial calcification. No atherosclerotic calcifications in the thoracic aorta or the coronary arteries. Mediastinum/Nodes: No pathologically enlarged mediastinal or hilar lymph nodes. Esophagus is unremarkable in appearance. No axillary lymphadenopathy. Lungs/Pleura: There continues to be a pattern of mild reticulonodularity throughout both lungs with some mild nodular thickening of the fissures and the subpleural spaces, very similar to prior examinations, compatible with stable treated lymphangitic carcinomatosis. 6 mm right lower lobe pulmonary nodule (axial image 113 of series 4) previously measured only 3 mm. No other definite new or other clearly enlarging pulmonary nodules or masses are noted on today's examination. No acute consolidative airspace disease. No pleural effusions. Musculoskeletal: Multiple tiny sclerotic lesions scattered throughout the visualized axial and appendicular skeleton, similar to prior studies, likely to represent widespread treated metastatic lesions to the bones. No larger new suspicious osseous lesions are noted. CT ABDOMEN PELVIS FINDINGS Hepatobiliary: No suspicious cystic or solid hepatic lesions. No intra or extrahepatic biliary ductal dilatation. Gallbladder is normal in appearance. Pancreas: No pancreatic mass. No pancreatic ductal  dilatation. No pancreatic or peripancreatic fluid collections or inflammatory changes. Spleen: Unremarkable. Adrenals/Urinary Tract: Bilateral kidneys and bilateral adrenal glands are normal in appearance. No hydroureteronephrosis. Urinary bladder is normal in appearance.  Stomach/Bowel: Normal appearance of the stomach. No pathologic dilatation of small bowel or colon. Normal appendix. Vascular/Lymphatic: No significant atherosclerotic disease, aneurysm or dissection identified in the visualized abdominal vasculature. No lymphadenopathy noted in the abdomen or pelvis. Reproductive: Uterus and ovaries are unremarkable in appearance. Other: No significant volume of ascites.  No pneumoperitoneum. Musculoskeletal: Multiple tiny sclerotic lesions are again noted throughout the visualized axial and appendicular skeleton suggestive of stable treated widespread metastatic disease to the bones. IMPRESSION: 1. Today's examination demonstrates essentially stable disease in terms of widespread metastatic disease to the lungs (treated lymphangitic carcinomatosis) and treated metastatic disease to the bones. The exception is a single right lower lobe pulmonary nodule which has increased in size from 3 mm on the prior to 6 mm on today's examination (axial image 113 of series 4). Electronically Signed   By: Vinnie Langton M.D.   On: 11/20/2020 07:04   MR Brain W Wo Contrast  Result Date: 11/18/2020 CLINICAL DATA:  Non small cell lung cancer. EXAM: MRI HEAD WITHOUT AND WITH CONTRAST TECHNIQUE: Multiplanar, multiecho pulse sequences of the brain and surrounding structures were obtained without and with intravenous contrast. CONTRAST:  33m GADAVIST GADOBUTROL 1 MMOL/ML IV SOLN COMPARISON:  MR head without and with contrast 07/08/2019 FINDINGS: Brain: Previously seen enhancing lesions are no longer present. Focal pathologic enhancement is present to suggest metastatic disease. No acute infarct, hemorrhage, or mass lesion is present. The ventricles are of normal size. No significant white matter lesions are present. No significant extraaxial fluid collection is present. The internal auditory canals are within normal limits. The brainstem and cerebellum are within normal limits. Vascular: Flow is  present in the major intracranial arteries. Skull and upper cervical spine: The craniocervical junction is normal. Upper cervical spine is within normal limits. Marrow signal is unremarkable. Sinuses/Orbits: A polyp or mucous retention cyst is present posteriorly and inferiorly in the right maxillary sinus. The paranasal sinuses and mastoid air cells are otherwise clear. IMPRESSION: 1. No evidence for metastatic disease to the brain or meninges. 2. Normal MRI appearance of the brain. Electronically Signed   By: CSan MorelleM.D.   On: 11/18/2020 19:50   CT Abdomen Pelvis W Contrast  Result Date: 11/20/2020 CLINICAL DATA:  35year old female with history of non-small cell lung cancer. Ongoing oral chemotherapy. Follow-up study. EXAM: CT CHEST, ABDOMEN, AND PELVIS WITH CONTRAST TECHNIQUE: Multidetector CT imaging of the chest, abdomen and pelvis was performed following the standard protocol during bolus administration of intravenous contrast. CONTRAST:  1027mOMNIPAQUE IOHEXOL 350 MG/ML SOLN COMPARISON:  CT the chest, abdomen and pelvis 07/11/2020. FINDINGS: CT CHEST FINDINGS Cardiovascular: Heart size is normal. There is no significant pericardial fluid, thickening or pericardial calcification. No atherosclerotic calcifications in the thoracic aorta or the coronary arteries. Mediastinum/Nodes: No pathologically enlarged mediastinal or hilar lymph nodes. Esophagus is unremarkable in appearance. No axillary lymphadenopathy. Lungs/Pleura: There continues to be a pattern of mild reticulonodularity throughout both lungs with some mild nodular thickening of the fissures and the subpleural spaces, very similar to prior examinations, compatible with stable treated lymphangitic carcinomatosis. 6 mm right lower lobe pulmonary nodule (axial image 113 of series 4) previously measured only 3 mm. No other definite new or other clearly enlarging pulmonary nodules or masses are noted on today's examination. No acute  consolidative airspace disease. No pleural effusions. Musculoskeletal: Multiple tiny sclerotic lesions scattered throughout the visualized axial and appendicular skeleton, similar to prior studies, likely to represent widespread treated metastatic lesions to the bones. No larger new suspicious osseous lesions are noted. CT ABDOMEN PELVIS FINDINGS Hepatobiliary: No suspicious cystic or solid hepatic lesions. No intra or extrahepatic biliary ductal dilatation. Gallbladder is normal in appearance. Pancreas: No pancreatic mass. No pancreatic ductal dilatation. No pancreatic or peripancreatic fluid collections or inflammatory changes. Spleen: Unremarkable. Adrenals/Urinary Tract: Bilateral kidneys and bilateral adrenal glands are normal in appearance. No hydroureteronephrosis. Urinary bladder is normal in appearance. Stomach/Bowel: Normal appearance of the stomach. No pathologic dilatation of small bowel or colon. Normal appendix. Vascular/Lymphatic: No significant atherosclerotic disease, aneurysm or dissection identified in the visualized abdominal vasculature. No lymphadenopathy noted in the abdomen or pelvis. Reproductive: Uterus and ovaries are unremarkable in appearance. Other: No significant volume of ascites.  No pneumoperitoneum. Musculoskeletal: Multiple tiny sclerotic lesions are again noted throughout the visualized axial and appendicular skeleton suggestive of stable treated widespread metastatic disease to the bones. IMPRESSION: 1. Today's examination demonstrates essentially stable disease in terms of widespread metastatic disease to the lungs (treated lymphangitic carcinomatosis) and treated metastatic disease to the bones. The exception is a single right lower lobe pulmonary nodule which has increased in size from 3 mm on the prior to 6 mm on today's examination (axial image 113 of series 4). Electronically Signed   By: Vinnie Langton M.D.   On: 11/20/2020 07:04     ASSESSMENT AND PLAN: This is a  very pleasant 35 years old white female recently diagnosed with stage IV non-small cell lung cancer, adenocarcinoma with positive ALK gene translocation in March 2021.  The patient started treatment with Alecensa on July 23, 2019 and within few days she started not significant improvement in her condition with less shortness of breath as well as decrease in supraclavicular lymphadenopathy.  She is currently on treatment with Alecensa 600 mg p.o. twice daily status post 16 months of treatment. The patient continues to tolerate her treatment with Alecensa fairly well with no concerning adverse effect except for mild fatigue.  She also gained a lot of weight recently. She had repeat CT scan of the chest, abdomen pelvis performed recently.  I personally and independently reviewed the scan images and discussed the results and showed the images with the patient today. Her scan showed no concerning findings for disease progression except for slightly increased single right lower lobe pulmonary nodule which currently measured 6 mm. I recommended for the patient to continue her current treatment with Alecensa with the same dose for now. I will continue to monitor this pulmonary nodule closely and if it continues to increase in size, I will refer the patient to radiation oncology for consideration of SBRT. The patient will come back for follow-up visit in 2 months for evaluation and repeat blood work. The patient was advised to call immediately if she has any concerning symptoms in the interval.  The patient voices understanding of current disease status and treatment options and is in agreement with the current care plan.  All questions were answered. The patient knows to call the clinic with any problems, questions or concerns. We can certainly see the patient much sooner if necessary.  Disclaimer: This note was dictated with voice recognition software. Similar sounding words can inadvertently be transcribed  and may not be corrected upon review.

## 2020-11-24 ENCOUNTER — Other Ambulatory Visit (HOSPITAL_COMMUNITY): Payer: Self-pay

## 2020-11-24 MED FILL — Alectinib HCl Cap 150 MG (Base Equivalent): ORAL | 30 days supply | Qty: 240 | Fill #3 | Status: AC

## 2020-11-28 ENCOUNTER — Other Ambulatory Visit (HOSPITAL_COMMUNITY): Payer: Self-pay

## 2020-12-06 ENCOUNTER — Ambulatory Visit: Payer: Medicaid Other | Admitting: Adult Health

## 2020-12-22 ENCOUNTER — Other Ambulatory Visit (HOSPITAL_COMMUNITY): Payer: Self-pay

## 2020-12-22 MED FILL — Alectinib HCl Cap 150 MG (Base Equivalent): ORAL | 30 days supply | Qty: 240 | Fill #4 | Status: AC

## 2020-12-26 ENCOUNTER — Other Ambulatory Visit (HOSPITAL_COMMUNITY): Payer: Self-pay

## 2021-01-16 ENCOUNTER — Other Ambulatory Visit (HOSPITAL_COMMUNITY): Payer: Self-pay

## 2021-01-23 ENCOUNTER — Inpatient Hospital Stay: Payer: Medicaid Other | Attending: Internal Medicine | Admitting: Internal Medicine

## 2021-01-23 ENCOUNTER — Other Ambulatory Visit: Payer: Self-pay

## 2021-01-23 ENCOUNTER — Inpatient Hospital Stay: Payer: Medicaid Other

## 2021-01-23 ENCOUNTER — Other Ambulatory Visit (HOSPITAL_COMMUNITY): Payer: Self-pay

## 2021-01-23 VITALS — BP 146/80 | HR 84 | Temp 97.6°F | Resp 20 | Ht 70.0 in | Wt 295.9 lb

## 2021-01-23 DIAGNOSIS — C3490 Malignant neoplasm of unspecified part of unspecified bronchus or lung: Secondary | ICD-10-CM

## 2021-01-23 DIAGNOSIS — C3491 Malignant neoplasm of unspecified part of right bronchus or lung: Secondary | ICD-10-CM | POA: Diagnosis present

## 2021-01-23 DIAGNOSIS — C7931 Secondary malignant neoplasm of brain: Secondary | ICD-10-CM | POA: Insufficient documentation

## 2021-01-23 DIAGNOSIS — N289 Disorder of kidney and ureter, unspecified: Secondary | ICD-10-CM | POA: Diagnosis not present

## 2021-01-23 DIAGNOSIS — D649 Anemia, unspecified: Secondary | ICD-10-CM | POA: Diagnosis not present

## 2021-01-23 LAB — CMP (CANCER CENTER ONLY)
ALT: 26 U/L (ref 0–44)
AST: 22 U/L (ref 15–41)
Albumin: 3.8 g/dL (ref 3.5–5.0)
Alkaline Phosphatase: 160 U/L — ABNORMAL HIGH (ref 38–126)
Anion gap: 11 (ref 5–15)
BUN: 12 mg/dL (ref 6–20)
CO2: 22 mmol/L (ref 22–32)
Calcium: 8.9 mg/dL (ref 8.9–10.3)
Chloride: 108 mmol/L (ref 98–111)
Creatinine: 1.28 mg/dL — ABNORMAL HIGH (ref 0.44–1.00)
GFR, Estimated: 56 mL/min — ABNORMAL LOW (ref >60)
Glucose, Bld: 99 mg/dL (ref 70–99)
Potassium: 4 mmol/L (ref 3.5–5.1)
Sodium: 141 mmol/L (ref 135–145)
Total Bilirubin: 1 mg/dL (ref 0.3–1.2)
Total Protein: 7.5 g/dL (ref 6.5–8.1)

## 2021-01-23 LAB — CBC WITH DIFFERENTIAL (CANCER CENTER ONLY)
Abs Immature Granulocytes: 0.02 10*3/uL (ref 0.00–0.07)
Basophils Absolute: 0 10*3/uL (ref 0.0–0.1)
Basophils Relative: 0 %
Eosinophils Absolute: 0.2 10*3/uL (ref 0.0–0.5)
Eosinophils Relative: 2 %
HCT: 31.9 % — ABNORMAL LOW (ref 36.0–46.0)
Hemoglobin: 10.7 g/dL — ABNORMAL LOW (ref 12.0–15.0)
Immature Granulocytes: 0 %
Lymphocytes Relative: 42 %
Lymphs Abs: 4.2 10*3/uL — ABNORMAL HIGH (ref 0.7–4.0)
MCH: 30.2 pg (ref 26.0–34.0)
MCHC: 33.5 g/dL (ref 30.0–36.0)
MCV: 90.1 fL (ref 80.0–100.0)
Monocytes Absolute: 0.8 10*3/uL (ref 0.1–1.0)
Monocytes Relative: 8 %
Neutro Abs: 5 10*3/uL (ref 1.7–7.7)
Neutrophils Relative %: 48 %
Platelet Count: 294 10*3/uL (ref 150–400)
RBC: 3.54 MIL/uL — ABNORMAL LOW (ref 3.87–5.11)
RDW: 14.2 % (ref 11.5–15.5)
WBC Count: 10.2 10*3/uL (ref 4.0–10.5)
nRBC: 0 % (ref 0.0–0.2)

## 2021-01-23 NOTE — Progress Notes (Signed)
[place] [phone]   [phone]  OFFICE PROGRESS NOTE  [name], PA-C [place] [address]  DIAGNOSIS: Stage IV (T3, N2, M1c) non-small cell lung cancer, adenocarcinoma with ALK gene translocation presented with multiple bilateral pulmonary nodules and masses as well as small mediastinal and bilateral axillary lymphadenopathy and metastatic disease to the brain diagnosed in March 2021.  PRIOR THERAPY: None  CURRENT THERAPY: Alecensa (Alectinib) 600 mg p.o. twice daily.  First dose started on July 23 2019.  Status post 18 months of treatment.  INTERVAL HISTORY: Sarah Chandler 35 y.o. female returns to the clinic today for follow-up visit.  The patient is feeling fine today with no concerning complaints except for mild fatigue.  She was accompanied by her mother today.  She denied having any chest pain, shortness of breath, cough or hemoptysis.  She denied having any fever or chills.  She has no nausea, vomiting, diarrhea or constipation.  She has no headache or visual changes.  She has no significant weight loss or night sweats.  She continues to tolerate her treatment with Alecensa fairly well.  She is here for evaluation and repeat blood work.  MEDICAL HISTORY: Past Medical History:  Diagnosis Date   nscl ca dx'd 06/2019   Pneumonia 05/08/2019    ALLERGIES:  is allergic to iron.  MEDICATIONS:  Current Outpatient Medications  Medication Sig Dispense Refill   acetaminophen (TYLENOL) 500 MG tablet Take 1,000 mg by mouth every 6 (six) hours as needed for moderate pain or headache.     albuterol (VENTOLIN HFA) 108 (90 Base) MCG/ACT inhaler Inhale 2 puffs into the lungs every 6 (six) hours as needed for wheezing or shortness of breath. 8 g 1   alectinib (ALECENSA) 150 MG capsule Take 4 capsules (600 mg total) by mouth 2 (two) times daily with a meal. 240 capsule 5   alectinib (ALECENSA) 150 MG capsule TAKE 4  CAPSULES (600 MG TOTAL) BY MOUTH 2 (TWO) TIMES DAILY WITH A MEAL. 240 capsule 5   ergocalciferol (VITAMIN D2) 1.25 MG (50000 UT) capsule Take 1 capsule (50,000 Units total) by mouth 2 (two) times a week. 26 capsule 2   hydrocortisone cream 1 % Apply 1 application topically 2 (two) times daily. 30 g 0   hydrOXYzine (ATARAX/VISTARIL) 25 MG tablet Take 1 tablet (25 mg total) by mouth 3 (three) times daily as needed for anxiety. **NEEDS APT FOR FURTHER REFILL** 30 tablet 0   levothyroxine (SYNTHROID) 75 MCG tablet Take 1 tablet (75 mcg total) by mouth daily. 90 tablet 3   Multiple Vitamin (MULTIVITAMIN WITH MINERALS) TABS tablet Take 2 tablets by mouth daily. Gummies     No current facility-administered medications for this visit.    SURGICAL HISTORY:  Past Surgical History:  Procedure Laterality Date   BIOPSY  07/03/2019   Procedure: BIOPSY;  Surgeon: Rigoberto Noel, MD;  Location: [facility] ENDOSCOPY;  Service: Cardiopulmonary;;   BRONCHIAL BRUSHINGS  07/03/2019   Procedure: BRONCHIAL BRUSHINGS;  Surgeon: Rigoberto Noel, MD;  Location: [place];  Service: Cardiopulmonary;;   BRONCHIAL WASHINGS  07/03/2019   Procedure: BRONCHIAL WASHINGS;  Surgeon: Rigoberto Noel, MD;  Location: [place];  Service: Cardiopulmonary;;   NO PAST SURGERIES     TOOTH EXTRACTION     VIDEO BRONCHOSCOPY N/A 07/03/2019   Procedure: VIDEO BRONCHOSCOPY WITH FLUORO;  Surgeon: Rigoberto Noel, MD;  Location: [place];  Service: Cardiopulmonary;  Laterality: N/A;  LMA    REVIEW OF SYSTEMS:  A comprehensive review of systems was negative except for: Constitutional: positive for fatigue   PHYSICAL EXAMINATION: General appearance: alert, cooperative, fatigued, and no distress Head: Normocephalic, without obvious abnormality, atraumatic Neck: no adenopathy, no JVD, supple, symmetrical, trachea midline, and thyroid not enlarged, symmetric, no tenderness/mass/nodules Lymph nodes: Cervical, supraclavicular, and axillary nodes  normal. Resp: clear to auscultation bilaterally Back: symmetric, no curvature. ROM normal. No CVA tenderness. Cardio: regular rate and rhythm, S1, S2 normal, no murmur, click, rub or gallop GI: soft, non-tender; bowel sounds normal; no masses,  no organomegaly Extremities: extremities normal, atraumatic, no cyanosis or edema  ECOG PERFORMANCE STATUS: 1 - Symptomatic but completely ambulatory  Blood pressure (!) 146/80, pulse 84, temperature 97.6 F (36.4 C), temperature source Tympanic, resp. rate 20, height _0  (1.778 m), weight 295 lb 14.4 oz (134.2 kg), SpO2 99 %.  LABORATORY DATA: Lab Results  Component Value Date   WBC 10.2 01/23/2021   HGB 10.7 (L) 01/23/2021   HCT 31.9 (L) 01/23/2021   MCV 90.1 01/23/2021   PLT 294 01/23/2021      Chemistry      Component Value Date/Time   NA 141 01/23/2021 1521   NA 139 05/19/2019 1146   K 4.0 01/23/2021 1521   CL 108 01/23/2021 1521   CO2 22 01/23/2021 1521   BUN 12 01/23/2021 1521   BUN 5 (L) 05/19/2019 1146   CREATININE 1.28 (H) 01/23/2021 1521      Component Value Date/Time   CALCIUM 9.0 11/18/2020 1011   ALKPHOS 176 (H) 11/18/2020 1011   AST 27 11/18/2020 1011   ALT 31 11/18/2020 1011   BILITOT 0.9 11/18/2020 1011       RADIOGRAPHIC STUDIES: No results found.   ASSESSMENT AND PLAN: This is a very pleasant 35 years old white female recently diagnosed with stage IV non-small cell lung cancer, adenocarcinoma with positive ALK gene translocation in March 2021.  The patient started treatment with Alecensa on July 23, 2019 and within few days she started not significant improvement in her condition with less shortness of breath as well as decrease in supraclavicular lymphadenopathy.  She is currently on treatment with Alecensa 600 mg p.o. twice daily status post 18 months of treatment. The patient has been tolerating this treatment well with no concerning adverse effects. She had repeat CBC and comprehensive metabolic  panel performed earlier today.  Her lab is unremarkable except for mild anemia and mild renal insufficiency. I recommended for her to continue her current treatment with Alecensa with the same dose. I will see her back for follow-up visit in 2 months for evaluation with repeat CT scan of the chest, abdomen and pelvis for restaging of her disease. The patient was advised to call immediately if she has any other concerning symptoms in the interval.  The patient voices understanding of current disease status and treatment options and is in agreement with the current care plan.  All questions were answered. The patient knows to call the clinic with any problems, questions or concerns. We can certainly see the patient much sooner if necessary.  Disclaimer: This note was dictated with voice recognition software. Similar sounding words can inadvertently be transcribed and may not be corrected upon review.

## 2021-01-24 ENCOUNTER — Telehealth: Payer: Self-pay | Admitting: Internal Medicine

## 2021-01-24 ENCOUNTER — Other Ambulatory Visit (HOSPITAL_COMMUNITY): Payer: Self-pay

## 2021-01-24 MED FILL — Alectinib HCl Cap 150 MG (Base Equivalent): ORAL | 30 days supply | Qty: 240 | Fill #5 | Status: AC

## 2021-01-24 NOTE — Telephone Encounter (Signed)
Scheduled follow-up appointments per 10/17 los. Patient is aware. 

## 2021-01-30 ENCOUNTER — Encounter: Payer: Self-pay | Admitting: Internal Medicine

## 2021-01-30 ENCOUNTER — Other Ambulatory Visit: Payer: Self-pay

## 2021-01-30 ENCOUNTER — Ambulatory Visit (INDEPENDENT_AMBULATORY_CARE_PROVIDER_SITE_OTHER): Payer: Medicaid Other | Admitting: Internal Medicine

## 2021-01-30 VITALS — BP 118/76 | HR 76 | Ht 70.0 in | Wt 299.8 lb

## 2021-01-30 DIAGNOSIS — E039 Hypothyroidism, unspecified: Secondary | ICD-10-CM

## 2021-01-30 DIAGNOSIS — E559 Vitamin D deficiency, unspecified: Secondary | ICD-10-CM

## 2021-01-30 DIAGNOSIS — Z8639 Personal history of other endocrine, nutritional and metabolic disease: Secondary | ICD-10-CM

## 2021-01-30 LAB — TSH: TSH: 4.12 u[IU]/mL (ref 0.35–5.50)

## 2021-01-30 LAB — VITAMIN D 25 HYDROXY (VIT D DEFICIENCY, FRACTURES): VITD: 39.88 ng/mL (ref 30.00–100.00)

## 2021-01-30 MED ORDER — HYDROCORTISONE 1 % EX CREA: 1.0000 "application " | TOPICAL_CREAM | Freq: Two times a day (BID) | CUTANEOUS | 0 refills | Status: DC

## 2021-01-30 MED ORDER — LEVOTHYROXINE SODIUM 88 MCG PO TABS: 88.0000 ug | ORAL_TABLET | Freq: Every day | ORAL | 3 refills | Status: DC

## 2021-01-30 NOTE — Progress Notes (Signed)
Name: Sarah Chandler  MRN/ DOB: [id number], 1985-07-04    Age/ Sex: 35 y.o., female     PCP: [name], PA-C   Reason for Endocrinology Evaluation: Hyperthyroidism     Initial Endocrinology Clinic Visit: 06/08/2019    PATIENT IDENTIFIER: [name] is a 35 y.o., female with a past medical history of hyperthyroidism and metastatic non-small cell carcinoma (06/2019). She has followed with [facility] since 06/08/2019 for consultative assistance with management of her hyperthyroidism.   HISTORICAL SUMMARY:  Pt presented to the ED on 05/10/2019 with palpitations and fatigue. She was noted to have a suppressed TSH < 0.005 uIU/mL with elevated FT4 at 1.70 ng/dL .She was started on Methimazole at the time.   In the meantime she was diagnosed with metastatic lung ca and was on Alectinib. TSH trended up to 91 uIU/mL by 09/2019 and methimazole was stopped.   LT-4 replacement was started 12/2019  No FH of thyroid disease SUBJECTIVE:   Today (01/30/2021):  [name] is here for a follow up on hypothyroidism   She continues on  Alectinib started 07/2019 for metastatic lung cancer   She has been noted with weight gain Denies constipation or diarrhea  Denies local neck symptoms   She has a left shin rash that she attributes to chemo, pruritic in nature, asking for a refill on HC cream   Has been struggling with mental health issues, waiting on referral    Levothyroxine 75 mcg daily  Ergocalciferol 50, 000 two tabs once a weekly     HISTORY:  Past Medical History:  Past Medical History:  Diagnosis Date   nscl ca dx'd 06/2019   Pneumonia 05/08/2019   Past Surgical History:  Past Surgical History:  Procedure Laterality Date   BIOPSY  07/03/2019   Procedure: BIOPSY;  Surgeon: Rigoberto Noel, MD;  Location: [place];  Service: Cardiopulmonary;;   BRONCHIAL BRUSHINGS  07/03/2019   Procedure: BRONCHIAL BRUSHINGS;  Surgeon: Rigoberto Noel, MD;  Location: [place];  Service: Cardiopulmonary;;   BRONCHIAL WASHINGS  07/03/2019   Procedure: BRONCHIAL WASHINGS;  Surgeon: Rigoberto Noel, MD;  Location: [place];  Service: Cardiopulmonary;;   NO PAST SURGERIES     TOOTH EXTRACTION     VIDEO BRONCHOSCOPY N/A 07/03/2019   Procedure: VIDEO BRONCHOSCOPY WITH FLUORO;  Surgeon: Rigoberto Noel, MD;  Location: [place];  Service: Cardiopulmonary;  Laterality: N/A;  LMA   Social History:  reports that she has never smoked. She has never used smokeless tobacco. She reports that she does not drink alcohol and does not use drugs. Family History:  Family History  Problem Relation Age of Onset   Diabetes Father    Hyperlipidemia Father    Hypertension Father    Depression Maternal Uncle    Diabetes Maternal Uncle    Cancer Maternal Grandmother    Hyperlipidemia Maternal Grandmother    Cancer Paternal Grandfather      HOME MEDICATIONS: Allergies as of 01/30/2021       Reactions   Iron Other (See Comments)   Ferrosol - Liquid  Unknown reaction         Medication List        Accurate as of January 30, 2021  8:40 AM. If you have any questions, ask your nurse or doctor.          acetaminophen 500 MG tablet Commonly known as: TYLENOL Take 1,000 mg by mouth every 6 (six) hours as  needed for moderate pain or headache.   albuterol 108 (90 Base) MCG/ACT inhaler Commonly known as: VENTOLIN HFA Inhale 2 puffs into the lungs every 6 (six) hours as needed for wheezing or shortness of breath.   alectinib 150 MG capsule Commonly known as: ALECENSA Take 4 capsules (600 mg total) by mouth 2 (two) times daily with a meal.   Alecensa 150 MG capsule Generic drug: alectinib TAKE 4 CAPSULES (600 MG TOTAL) BY MOUTH 2 (TWO) TIMES DAILY WITH A MEAL.   ergocalciferol 1.25 MG (50000 UT) capsule Commonly known as: VITAMIN D2 Take 1 capsule (50,000 Units total) by mouth 2 (two) times a week.   hydrocortisone cream 1 % Apply 1 application  topically 2 (two) times daily.   hydrOXYzine 25 MG tablet Commonly known as: ATARAX/VISTARIL Take 1 tablet (25 mg total) by mouth 3 (three) times daily as needed for anxiety. **NEEDS APT FOR FURTHER REFILL**   levothyroxine 75 MCG tablet Commonly known as: SYNTHROID Take 1 tablet (75 mcg total) by mouth daily.          OBJECTIVE:   PHYSICAL EXAM: VS: BP 118/76 (BP Location: Left Arm, Patient Position: Sitting, Cuff Size: Large)   Pulse 76   Ht 5\' 10"  (1.778 m)   Wt 299 lb 12.8 oz (136 kg)   SpO2 99%   BMI 43.02 kg/m    EXAM: General: Pt appears well and is in NAD Right eye stare but no exophthalmos  Neck: General: Supple without adenopathy. Thyroid: Thyroid size normal.  No goiter or nodules appreciated.   Lungs: Clear with good BS bilat with no rales, rhonchi, or wheezes  Heart: Auscultation: RRR.  Abdomen: Normoactive bowel sounds, soft, nontender, without masses or organomegaly palpable  Skin: Erythematous macular rash over the left shin   Extremities:  BL LE: No pretibial edema normal ROM and strength.     DATA REVIEWED:  Results for TONI, DEMO (MRN [id number]) as of 01/30/2021 13:53  Ref. Range 01/30/2021 08:57  VITD Latest Ref Range: 30.00 - 100.00 ng/mL 39.88  TSH Latest Ref Range: 0.35 - 5.50 uIU/mL 4.12     Results for AUBRY, RANKIN (MRN [id number]) as of 12/25/2019 09:03  Ref. Range 06/08/2019 14:52  TRAB Latest Ref Range: <=2.00 IU/L 5.19 (H)   ASSESSMENT / PLAN / RECOMMENDATIONS:   Hypothyroidism :    - She was initially diagnosed with hyperthyroidism secondary to Graves' disease in 04/2019, she had to be started on Alectinib in 07/2019 for metastatic lung cancer and by 09/2019 she was off Methimazole followed by hypothyroidism . I suspect Alectinib may have to do with hypothyroidism development.  - No local neck symptoms  - TSH is normal but trending up will adjust levothyroxine accordingly  Medication Stop levothyroxine 75 mcg daily   Start levothyroxine 88 MCG daily  2. Hx of Graves' Disease:  -No extrathyroidal manifestations of Graves' disease  3. Vitamin D deficiency:  - Repeat Vitamin D is normal  Medication   - Continue ergocalciferol 50,000 iu twice weekly   4. Rash:  -We will refill hydrocortisone cream to be used as needed   F/U in 6 months     Signed electronically by: Mack Guise, MD  [facility]  [facility] [address] [place], [place] [address] Phone: [phone] FAX: [phone]      CC: [name], PA-C [place] [address] Phone: [phone]  Fax: [phone]   Return to  Endocrinology clinic as below: Future Appointments  Date Time Provider [place]  03/24/2021  9:30 AM [facility] None  03/27/2021  9:30 AM Curt Bears, MD [facility] None

## 2021-01-30 NOTE — Patient Instructions (Signed)

## 2021-02-02 ENCOUNTER — Ambulatory Visit: Payer: Medicaid Other | Admitting: Adult Health

## 2021-02-10 ENCOUNTER — Other Ambulatory Visit (HOSPITAL_COMMUNITY): Payer: Self-pay

## 2021-02-10 ENCOUNTER — Other Ambulatory Visit: Payer: Self-pay | Admitting: Internal Medicine

## 2021-02-11 MED ORDER — ALECENSA 150 MG PO CAPS
ORAL_CAPSULE | ORAL | 5 refills | Status: DC
  Filled 2021-02-11: qty 240, fill #0
  Filled 2021-02-15 – 2021-02-22 (×2): qty 240, 30d supply, fill #0
  Filled 2021-03-15: qty 240, 30d supply, fill #1
  Filled 2021-04-21: qty 240, 30d supply, fill #2
  Filled 2021-05-15: qty 240, 30d supply, fill #3
  Filled 2021-05-24: qty 240, 30d supply, fill #0
  Filled 2021-06-16: qty 240, 30d supply, fill #1
  Filled 2021-07-11: qty 240, 30d supply, fill #2

## 2021-02-13 ENCOUNTER — Other Ambulatory Visit (HOSPITAL_COMMUNITY): Payer: Self-pay

## 2021-02-15 ENCOUNTER — Other Ambulatory Visit (HOSPITAL_COMMUNITY): Payer: Self-pay

## 2021-02-16 ENCOUNTER — Other Ambulatory Visit (HOSPITAL_COMMUNITY): Payer: Self-pay

## 2021-02-17 ENCOUNTER — Other Ambulatory Visit (HOSPITAL_COMMUNITY): Payer: Self-pay

## 2021-02-18 ENCOUNTER — Other Ambulatory Visit (HOSPITAL_COMMUNITY): Payer: Self-pay

## 2021-02-22 ENCOUNTER — Other Ambulatory Visit (HOSPITAL_COMMUNITY): Payer: Self-pay

## 2021-02-28 ENCOUNTER — Other Ambulatory Visit (HOSPITAL_COMMUNITY): Payer: Self-pay

## 2021-03-15 ENCOUNTER — Other Ambulatory Visit (HOSPITAL_COMMUNITY): Payer: Self-pay

## 2021-03-23 ENCOUNTER — Other Ambulatory Visit (HOSPITAL_COMMUNITY): Payer: Self-pay

## 2021-03-24 ENCOUNTER — Ambulatory Visit (HOSPITAL_COMMUNITY)
Admission: RE | Admit: 2021-03-24 | Discharge: 2021-03-24 | Disposition: A | Discharge status: Home / Self Care | Payer: Medicaid Other | Source: Ambulatory Visit | Attending: Internal Medicine | Admitting: Internal Medicine

## 2021-03-24 ENCOUNTER — Other Ambulatory Visit: Payer: Medicaid Other

## 2021-03-24 ENCOUNTER — Inpatient Hospital Stay: Payer: Medicaid Other | Attending: Internal Medicine

## 2021-03-24 ENCOUNTER — Other Ambulatory Visit: Payer: Self-pay

## 2021-03-24 DIAGNOSIS — C7951 Secondary malignant neoplasm of bone: Secondary | ICD-10-CM | POA: Insufficient documentation

## 2021-03-24 DIAGNOSIS — C3490 Malignant neoplasm of unspecified part of unspecified bronchus or lung: Secondary | ICD-10-CM | POA: Insufficient documentation

## 2021-03-24 DIAGNOSIS — C3491 Malignant neoplasm of unspecified part of right bronchus or lung: Secondary | ICD-10-CM | POA: Insufficient documentation

## 2021-03-24 DIAGNOSIS — C7931 Secondary malignant neoplasm of brain: Secondary | ICD-10-CM | POA: Insufficient documentation

## 2021-03-24 DIAGNOSIS — C3492 Malignant neoplasm of unspecified part of left bronchus or lung: Secondary | ICD-10-CM | POA: Insufficient documentation

## 2021-03-24 LAB — CBC WITH DIFFERENTIAL (CANCER CENTER ONLY)
Abs Immature Granulocytes: 0.04 10*3/uL (ref 0.00–0.07)
Basophils Absolute: 0.1 10*3/uL (ref 0.0–0.1)
Basophils Relative: 1 %
Eosinophils Absolute: 0.1 10*3/uL (ref 0.0–0.5)
Eosinophils Relative: 1 %
HCT: 32.6 % — ABNORMAL LOW (ref 36.0–46.0)
Hemoglobin: 10.7 g/dL — ABNORMAL LOW (ref 12.0–15.0)
Immature Granulocytes: 0 %
Lymphocytes Relative: 31 %
Lymphs Abs: 3.3 10*3/uL (ref 0.7–4.0)
MCH: 29.8 pg (ref 26.0–34.0)
MCHC: 32.8 g/dL (ref 30.0–36.0)
MCV: 90.8 fL (ref 80.0–100.0)
Monocytes Absolute: 0.7 10*3/uL (ref 0.1–1.0)
Monocytes Relative: 7 %
Neutro Abs: 6.4 10*3/uL (ref 1.7–7.7)
Neutrophils Relative %: 60 %
Platelet Count: 284 10*3/uL (ref 150–400)
RBC: 3.59 MIL/uL — ABNORMAL LOW (ref 3.87–5.11)
RDW: 14 % (ref 11.5–15.5)
WBC Count: 10.5 10*3/uL (ref 4.0–10.5)
nRBC: 0 % (ref 0.0–0.2)

## 2021-03-24 LAB — CMP (CANCER CENTER ONLY)
ALT: 22 U/L (ref 0–44)
AST: 21 U/L (ref 15–41)
Albumin: 3.7 g/dL (ref 3.5–5.0)
Alkaline Phosphatase: 174 U/L — ABNORMAL HIGH (ref 38–126)
Anion gap: 7 (ref 5–15)
BUN: 10 mg/dL (ref 6–20)
CO2: 25 mmol/L (ref 22–32)
Calcium: 8.7 mg/dL — ABNORMAL LOW (ref 8.9–10.3)
Chloride: 108 mmol/L (ref 98–111)
Creatinine: 0.77 mg/dL (ref 0.44–1.00)
GFR, Estimated: >60 mL/min (ref >60)
Glucose, Bld: 83 mg/dL (ref 70–99)
Potassium: 3.9 mmol/L (ref 3.5–5.1)
Sodium: 140 mmol/L (ref 135–145)
Total Bilirubin: 0.9 mg/dL (ref 0.3–1.2)
Total Protein: 7.5 g/dL (ref 6.5–8.1)

## 2021-03-24 MED ORDER — SODIUM CHLORIDE (PF) 0.9 % IJ SOLN
INTRAMUSCULAR | Status: AC
  Filled 2021-03-24: qty 50

## 2021-03-24 MED ORDER — IOHEXOL 350 MG/ML SOLN
100.0000 mL | Freq: Once | INTRAVENOUS | Status: AC | PRN
  Administered 2021-03-24: 100 mL via INTRAVENOUS

## 2021-03-27 ENCOUNTER — Other Ambulatory Visit: Payer: Self-pay

## 2021-03-27 ENCOUNTER — Encounter: Payer: Self-pay | Admitting: Internal Medicine

## 2021-03-27 ENCOUNTER — Inpatient Hospital Stay (HOSPITAL_BASED_OUTPATIENT_CLINIC_OR_DEPARTMENT_OTHER): Payer: Medicaid Other | Admitting: Internal Medicine

## 2021-03-27 VITALS — BP 144/78 | HR 70 | Temp 98.1°F | Resp 19 | Ht 70.0 in | Wt 297.1 lb

## 2021-03-27 DIAGNOSIS — C3491 Malignant neoplasm of unspecified part of right bronchus or lung: Secondary | ICD-10-CM | POA: Diagnosis not present

## 2021-03-27 DIAGNOSIS — C7931 Secondary malignant neoplasm of brain: Secondary | ICD-10-CM | POA: Diagnosis not present

## 2021-03-27 DIAGNOSIS — C3492 Malignant neoplasm of unspecified part of left bronchus or lung: Secondary | ICD-10-CM | POA: Diagnosis not present

## 2021-03-27 DIAGNOSIS — C3490 Malignant neoplasm of unspecified part of unspecified bronchus or lung: Secondary | ICD-10-CM | POA: Diagnosis not present

## 2021-03-27 DIAGNOSIS — C7951 Secondary malignant neoplasm of bone: Secondary | ICD-10-CM | POA: Diagnosis not present

## 2021-03-27 DIAGNOSIS — Z5111 Encounter for antineoplastic chemotherapy: Secondary | ICD-10-CM

## 2021-03-27 NOTE — Progress Notes (Signed)
Sarah Chandler [phone]   [phone]  OFFICE PROGRESS NOTE  Pcp, No No address on file  DIAGNOSIS: Stage IV (T3, N2, M1c) non-small cell lung cancer, adenocarcinoma with ALK gene translocation presented with multiple bilateral pulmonary nodules and masses as well as small mediastinal and bilateral axillary lymphadenopathy and metastatic disease to the brain diagnosed in March 2021.  PRIOR THERAPY: None  CURRENT THERAPY: Alecensa (Alectinib) 600 mg p.o. twice daily.  First dose started on July 23 2019.  Status post 20 months of treatment.  INTERVAL HISTORY: Sarah Chandler 35 y.o. female returns to the clinic today for follow-up visit accompanied by her cousin.  The patient is feeling fine today with no concerning complaints except for fatigue and stress dealing with her family.  She denied having any current chest pain, shortness of breath, cough or hemoptysis.  She denied having any fever or chills.  She has no nausea, vomiting, diarrhea or constipation.  She has no headache or visual changes.  She continues to tolerate her treatment with Alecensa fairly well.  The patient is here today for evaluation and repeat CT scan of the chest, abdomen and pelvis for restaging of her disease.  MEDICAL HISTORY: Past Medical History:  Diagnosis Date   nscl ca dx'd 06/2019   Pneumonia 05/08/2019    ALLERGIES:  is allergic to iron.  MEDICATIONS:  Current Outpatient Medications  Medication Sig Dispense Refill   acetaminophen (TYLENOL) 500 MG tablet Take 1,000 mg by mouth every 6 (six) hours as needed for moderate pain or headache.     albuterol (VENTOLIN HFA) 108 (90 Base) MCG/ACT inhaler Inhale 2 puffs into the lungs every 6 (six) hours as needed for wheezing or shortness of breath. 8 g 1   alectinib (ALECENSA) 150 MG capsule Take 4 capsules (600 mg total) by mouth 2 (two) times daily with a meal. 240 capsule 5   alectinib (ALECENSA) 150 MG capsule TAKE 4  CAPSULES (600 MG TOTAL) BY MOUTH 2 (TWO) TIMES DAILY WITH A MEAL. 240 capsule 5   ergocalciferol (VITAMIN D2) 1.25 MG (50000 UT) capsule Take 1 capsule (50,000 Units total) by mouth 2 (two) times a week. 26 capsule 2   hydrocortisone cream 1 % Apply 1 application topically 2 (two) times daily. 30 g 0   hydrOXYzine (ATARAX/VISTARIL) 25 MG tablet Take 1 tablet (25 mg total) by mouth 3 (three) times daily as needed for anxiety. **NEEDS APT FOR FURTHER REFILL** 30 tablet 0   levothyroxine (SYNTHROID) 88 MCG tablet Take 1 tablet (88 mcg total) by mouth daily. 90 tablet 3   No current facility-administered medications for this visit.    SURGICAL HISTORY:  Past Surgical History:  Procedure Laterality Date   BIOPSY  07/03/2019   Procedure: BIOPSY;  Surgeon: Rigoberto Noel, MD;  Location: [facility] ENDOSCOPY;  Service: Cardiopulmonary;;   BRONCHIAL BRUSHINGS  07/03/2019   Procedure: BRONCHIAL BRUSHINGS;  Surgeon: Rigoberto Noel, MD;  Location: [place];  Service: Cardiopulmonary;;   BRONCHIAL WASHINGS  07/03/2019   Procedure: BRONCHIAL WASHINGS;  Surgeon: Rigoberto Noel, MD;  Location: [place];  Service: Cardiopulmonary;;   NO PAST SURGERIES     TOOTH EXTRACTION     VIDEO BRONCHOSCOPY N/A 07/03/2019   Procedure: VIDEO BRONCHOSCOPY WITH FLUORO;  Surgeon: Rigoberto Noel, MD;  Location: [place];  Service: Cardiopulmonary;  Laterality: N/A;  LMA    REVIEW OF SYSTEMS:  Constitutional: positive for fatigue Eyes: negative Ears,  nose, mouth, throat, and face: negative Respiratory: negative Cardiovascular: negative Gastrointestinal: negative Genitourinary:negative Integument/breast: negative Hematologic/lymphatic: negative Musculoskeletal:negative Neurological: negative Behavioral/Psych: negative Endocrine: negative Allergic/Immunologic: negative   PHYSICAL EXAMINATION: General appearance: alert, cooperative, fatigued, and no distress Head: Normocephalic, without obvious abnormality,  atraumatic Neck: no adenopathy, no JVD, supple, symmetrical, trachea midline, and thyroid not enlarged, symmetric, no tenderness/mass/nodules Lymph nodes: Cervical, supraclavicular, and axillary nodes normal. Resp: clear to auscultation bilaterally Back: symmetric, no curvature. ROM normal. No CVA tenderness. Cardio: regular rate and rhythm, S1, S2 normal, no murmur, click, rub or gallop GI: soft, non-tender; bowel sounds normal; no masses,  no organomegaly Extremities: extremities normal, atraumatic, no cyanosis or edema Neurologic: Alert and oriented X 3, normal strength and tone. Normal symmetric reflexes. Normal coordination and gait  ECOG PERFORMANCE STATUS: 1 - Symptomatic but completely ambulatory  Blood pressure (!) 144/78, pulse 70, temperature 98.1 F (36.7 C), temperature source Tympanic, resp. rate 19, height '5\' 10"'  (1.778 m), weight 297 lb 1.6 oz (134.8 kg), SpO2 98 %.  LABORATORY DATA: Lab Results  Component Value Date   WBC 10.5 03/24/2021   HGB 10.7 (L) 03/24/2021   HCT 32.6 (L) 03/24/2021   MCV 90.8 03/24/2021   PLT 284 03/24/2021      Chemistry      Component Value Date/Time   NA 140 03/24/2021 1056   NA 139 05/19/2019 1146   K 3.9 03/24/2021 1056   CL 108 03/24/2021 1056   CO2 25 03/24/2021 1056   BUN 10 03/24/2021 1056   BUN 5 (L) 05/19/2019 1146   CREATININE 0.77 03/24/2021 1056      Component Value Date/Time   CALCIUM 8.7 (L) 03/24/2021 1056   ALKPHOS 174 (H) 03/24/2021 1056   AST 21 03/24/2021 1056   ALT 22 03/24/2021 1056   BILITOT 0.9 03/24/2021 1056       RADIOGRAPHIC STUDIES: CT Chest W Contrast  Result Date: 03/27/2021 CLINICAL DATA:  Lung cancer restaging, ongoing chemotherapy EXAM: CT CHEST, ABDOMEN, AND PELVIS WITH CONTRAST TECHNIQUE: Multidetector CT imaging of the chest, abdomen and pelvis was performed following the standard protocol during bolus administration of intravenous contrast. CONTRAST:  147m OMNIPAQUE IOHEXOL 350 MG/ML  SOLN, additional oral enteric contrast COMPARISON:  11/20/2020 FINDINGS: CT CHEST FINDINGS Cardiovascular: No significant vascular findings. Normal heart size. No pericardial effusion. Mediastinum/Nodes: No enlarged mediastinal, hilar, or axillary lymph nodes. Thyroid gland, trachea, and esophagus demonstrate no significant findings. Lungs/Pleura: Interval enlargement of a medial left upper lobe pulmonary nodule, measuring 0.9 x 0.8 cm, previously 0.6 x 0.5 cm (series 5, image 75). Slight interval enlargement of a dependent right lower lobe pulmonary nodule, measuring 0.7 x 0.6 cm, previously 0.6 x 0.4 cm (series 5, image 122). Other small nodules and background interlobular septal thickening and fissural/pleural nodularity is unchanged. No pleural effusion or pneumothorax. Musculoskeletal: No chest wall mass. CT ABDOMEN PELVIS FINDINGS Hepatobiliary: No solid liver abnormality is seen. No gallstones, gallbladder wall thickening, or biliary dilatation. Pancreas: Unremarkable. No pancreatic ductal dilatation or surrounding inflammatory changes. Spleen: Normal in size without significant abnormality. Adrenals/Urinary Tract: Adrenal glands are unremarkable. Kidneys are normal, without renal calculi, solid lesion, or hydronephrosis. Bladder is unremarkable. Stomach/Bowel: Stomach is within normal limits. Appendix appears normal. No evidence of bowel wall thickening, distention, or inflammatory changes. Vascular/Lymphatic: No significant vascular findings are present. No enlarged abdominal or pelvic lymph nodes. Reproductive: No mass or other abnormality. Other: No abdominal wall hernia or abnormality. No abdominopelvic ascites. Musculoskeletal: No acute osseous findings.  Numerous small sclerotic lesions throughout the included skeleton are unchanged. IMPRESSION: 1. Interval enlargement of a medial left upper lobe pulmonary nodule, measuring 0.9 x 0.8 cm, previously 0.6 x 0.5 cm. Slight interval enlargement of a  dependent right lower lobe pulmonary nodule, measuring 0.7 x 0.6 cm, previously 0.6 x 0.4 cm. Findings are consistent with slightly worsened pulmonary metastatic disease. 2. Other small nodules and background of interlobular septal thickening and fissural/pleural nodularity is unchanged, consistent with treated lymphangitic malignancy. 3. Numerous small sclerotic osseous metastatic lesions throughout the included skeleton are unchanged. 4. No evidence of metastatic disease in the abdomen or pelvis. Electronically Signed   By: Delanna Ahmadi M.D.   On: 03/27/2021 08:41   CT Abdomen Pelvis W Contrast  Result Date: 03/27/2021 CLINICAL DATA:  Lung cancer restaging, ongoing chemotherapy EXAM: CT CHEST, ABDOMEN, AND PELVIS WITH CONTRAST TECHNIQUE: Multidetector CT imaging of the chest, abdomen and pelvis was performed following the standard protocol during bolus administration of intravenous contrast. CONTRAST:  125m OMNIPAQUE IOHEXOL 350 MG/ML SOLN, additional oral enteric contrast COMPARISON:  11/20/2020 FINDINGS: CT CHEST FINDINGS Cardiovascular: No significant vascular findings. Normal heart size. No pericardial effusion. Mediastinum/Nodes: No enlarged mediastinal, hilar, or axillary lymph nodes. Thyroid gland, trachea, and esophagus demonstrate no significant findings. Lungs/Pleura: Interval enlargement of a medial left upper lobe pulmonary nodule, measuring 0.9 x 0.8 cm, previously 0.6 x 0.5 cm (series 5, image 75). Slight interval enlargement of a dependent right lower lobe pulmonary nodule, measuring 0.7 x 0.6 cm, previously 0.6 x 0.4 cm (series 5, image 122). Other small nodules and background interlobular septal thickening and fissural/pleural nodularity is unchanged. No pleural effusion or pneumothorax. Musculoskeletal: No chest wall mass. CT ABDOMEN PELVIS FINDINGS Hepatobiliary: No solid liver abnormality is seen. No gallstones, gallbladder wall thickening, or biliary dilatation. Pancreas: Unremarkable.  No pancreatic ductal dilatation or surrounding inflammatory changes. Spleen: Normal in size without significant abnormality. Adrenals/Urinary Tract: Adrenal glands are unremarkable. Kidneys are normal, without renal calculi, solid lesion, or hydronephrosis. Bladder is unremarkable. Stomach/Bowel: Stomach is within normal limits. Appendix appears normal. No evidence of bowel wall thickening, distention, or inflammatory changes. Vascular/Lymphatic: No significant vascular findings are present. No enlarged abdominal or pelvic lymph nodes. Reproductive: No mass or other abnormality. Other: No abdominal wall hernia or abnormality. No abdominopelvic ascites. Musculoskeletal: No acute osseous findings. Numerous small sclerotic lesions throughout the included skeleton are unchanged. IMPRESSION: 1. Interval enlargement of a medial left upper lobe pulmonary nodule, measuring 0.9 x 0.8 cm, previously 0.6 x 0.5 cm. Slight interval enlargement of a dependent right lower lobe pulmonary nodule, measuring 0.7 x 0.6 cm, previously 0.6 x 0.4 cm. Findings are consistent with slightly worsened pulmonary metastatic disease. 2. Other small nodules and background of interlobular septal thickening and fissural/pleural nodularity is unchanged, consistent with treated lymphangitic malignancy. 3. Numerous small sclerotic osseous metastatic lesions throughout the included skeleton are unchanged. 4. No evidence of metastatic disease in the abdomen or pelvis. Electronically Signed   By: ADelanna AhmadiM.D.   On: 03/27/2021 08:41     ASSESSMENT AND PLAN: This is a very pleasant 35years old white female recently diagnosed with stage IV non-small cell lung cancer, adenocarcinoma with positive ALK gene translocation in March 2021.  The patient started treatment with Alecensa on July 23, 2019 and within few days she started not significant improvement in her condition with less shortness of breath as well as decrease in supraclavicular  lymphadenopathy.  She is currently on treatment with AEyvonne Left  600 mg p.o. twice daily status post 20 months of treatment. The patient continues to tolerate this treatment well with no concerning adverse effect except for fatigue. She had repeat CT scan of the chest, abdomen pelvis performed recently.  I personally and independently reviewed the scan images and discussed the result and showed the images to the patient and her cousin today. Her scan showed no concerning findings for disease progression except for a slightly enlarging left upper lobe and right lower lobe pulmonary nodules that will need to be monitored closely on upcoming imaging studies.  If this nodule continues to increase in size, I will refer the patient to radiation oncology for consideration of SBRT to these nodules or consider changing the treatment to a different regimen if she has multiple areas of disease progression. The patient is in agreement with the current plan. I will see her back for follow-up visit in 2 months for evaluation with repeat blood work. She was advised to call immediately if she has any other concerning symptoms in the interval.  The patient voices understanding of current disease status and treatment options and is in agreement with the current care plan.  All questions were answered. The patient knows to call the clinic with any problems, questions or concerns. We can certainly see the patient much sooner if necessary.  Disclaimer: This note was dictated with voice recognition software. Similar sounding words can inadvertently be transcribed and may not be corrected upon review.

## 2021-04-19 ENCOUNTER — Other Ambulatory Visit (HOSPITAL_COMMUNITY): Payer: Self-pay

## 2021-04-21 ENCOUNTER — Other Ambulatory Visit (HOSPITAL_COMMUNITY): Payer: Self-pay

## 2021-04-25 ENCOUNTER — Other Ambulatory Visit (HOSPITAL_COMMUNITY): Payer: Self-pay

## 2021-05-09 ENCOUNTER — Ambulatory Visit: Payer: Medicaid Other | Admitting: Adult Health

## 2021-05-11 ENCOUNTER — Ambulatory Visit (INDEPENDENT_AMBULATORY_CARE_PROVIDER_SITE_OTHER): Payer: Medicaid Other | Admitting: Nurse Practitioner

## 2021-05-11 ENCOUNTER — Other Ambulatory Visit: Payer: Self-pay

## 2021-05-11 ENCOUNTER — Encounter: Payer: Self-pay | Admitting: Nurse Practitioner

## 2021-05-11 VITALS — BP 144/67 | HR 99 | Temp 98.1°F | Ht 70.0 in | Wt 297.8 lb

## 2021-05-11 DIAGNOSIS — C3491 Malignant neoplasm of unspecified part of right bronchus or lung: Secondary | ICD-10-CM | POA: Diagnosis not present

## 2021-05-11 DIAGNOSIS — E039 Hypothyroidism, unspecified: Secondary | ICD-10-CM | POA: Diagnosis not present

## 2021-05-11 DIAGNOSIS — Z7689 Persons encountering health services in other specified circumstances: Secondary | ICD-10-CM | POA: Diagnosis not present

## 2021-05-11 DIAGNOSIS — F322 Major depressive disorder, single episode, severe without psychotic features: Secondary | ICD-10-CM

## 2021-05-11 DIAGNOSIS — R635 Abnormal weight gain: Secondary | ICD-10-CM | POA: Insufficient documentation

## 2021-05-11 DIAGNOSIS — R5383 Other fatigue: Secondary | ICD-10-CM | POA: Diagnosis not present

## 2021-05-11 NOTE — Progress Notes (Signed)
New Patient Office Visit  Subjective:  Patient ID: Sarah Chandler, female    DOB: [birth year]  Age: 36 y.o. MRN: [id number]  CC:  Chief Complaint  Patient presents with   New Patient (Initial Visit)    HPI Sarah Chandler presents to reestablish with primary care provider. She was a patient in this office in the past, but has not been in a few years.  -she sees oncology and is currently under treatment for lung cancer. Currently on oral chemotherapy treatment due to positive OUT gene. Started treatment March 2021 and scans were significantly improved in just a few months.  -sees endocrinology for hypothyroid. Thyroid is generally well managed.  -increased stress at home, especially since her diagnosis of lung cancer. Does live at home with family. They frequently smoke around her when she should not be exposed to second hand smoke. Feels like she acts differently than other people. Feels like she has had trouble with this since se was a kid. 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 Thinks she may be autistic. She has never been formally assessed for mental health problems.  -concerned about weight gain. Has gained over 25 pounds since 01/2020. With thyroid problems and diagnosis of lung cancer, she has found it difficult to develop a plan to help her lose weight and keep it off.   Past Medical History:  Diagnosis Date   nscl ca dx'd 06/2019   Pneumonia 05/08/2019    Past Surgical History:  Procedure Laterality Date   BIOPSY  07/03/2019   Procedure: BIOPSY;  Surgeon: Rigoberto Noel, MD;  Location: [facility] ENDOSCOPY;  Service: Cardiopulmonary;;   BRONCHIAL BRUSHINGS  07/03/2019   Procedure: BRONCHIAL BRUSHINGS;  Surgeon: Rigoberto Noel, MD;  Location: [place];  Service: Cardiopulmonary;;   BRONCHIAL WASHINGS  07/03/2019   Procedure: BRONCHIAL WASHINGS;  Surgeon: Rigoberto Noel, MD;  Location: [place];  Service: Cardiopulmonary;;   NO PAST SURGERIES     TOOTH EXTRACTION     VIDEO  BRONCHOSCOPY N/A 07/03/2019   Procedure: VIDEO BRONCHOSCOPY WITH FLUORO;  Surgeon: Rigoberto Noel, MD;  Location: [place];  Service: Cardiopulmonary;  Laterality: N/A;  LMA    Family History  Problem Relation Age of Onset   Diabetes Father    Hyperlipidemia Father    Hypertension Father    Depression Maternal Uncle    Diabetes Maternal Uncle    Cancer Maternal Grandmother    Hyperlipidemia Maternal Grandmother    Cancer Paternal Grandfather     Social History   Socioeconomic History   Marital status: Single    Spouse name: Not on file   Number of children: Not on file   Years of education: Not on file   Highest education level: Not on file  Occupational History   Not on file  Tobacco Use   Smoking status: Never   Smokeless tobacco: Never  Vaping Use   Vaping Use: Never used  Substance and Sexual Activity   Alcohol use: Yes   Drug use: Never   Sexual activity: Not Currently  Other Topics Concern   Not on file  Social History Narrative   Not on file   Social Determinants of Health   Financial Resource Strain: Not on file  Food Insecurity: Not on file  Transportation Needs: Not on file  Physical Activity: Not on file  Stress: Not on file  Social Connections: Not on file  Intimate Partner Violence: Not on file    ROS Review  of Systems  Constitutional:  Positive for fatigue. Negative for activity change, appetite change, chills and fever.       Weight gain of over 25 pounds since 01/2020  HENT:  Negative for congestion, postnasal drip, rhinorrhea, sinus pressure, sinus pain, sneezing and sore throat.   Eyes: Negative.   Respiratory:  Positive for shortness of breath. Negative for cough, chest tightness and wheezing.   Cardiovascular:  Negative for chest pain and palpitations.  Gastrointestinal:  Negative for abdominal pain, constipation, diarrhea, nausea and vomiting.  Endocrine: Negative for cold intolerance, heat intolerance, polydipsia and polyuria.        Hypothyroid. Patient sees endocrinology   Genitourinary:  Negative for dyspareunia, dysuria, flank pain, frequency and urgency.  Musculoskeletal:  Negative for arthralgias, back pain and myalgias.  Skin:  Negative for rash.  Allergic/Immunologic: Negative for environmental allergies.  Neurological:  Negative for dizziness, weakness and headaches.  Hematological:  Negative for adenopathy.  Psychiatric/Behavioral:  Positive for decreased concentration, dysphoric mood and sleep disturbance. The patient is nervous/anxious.    Objective:   Today's Vitals   05/11/21 1012  BP: (!) 144/67  Pulse: 99  Temp: 98.1 F (36.7 C)  SpO2: 100%  Weight: 297 lb 12.8 oz (135.1 kg)  Height: _0  (1.778 m)   Body mass index is 42.73 kg/m.   Physical Exam Vitals and nursing note reviewed.  Constitutional:      Appearance: Normal appearance. She is well-developed. She is obese.  HENT:     Head: Normocephalic and atraumatic.     Nose: Nose normal.     Mouth/Throat:     Mouth: Mucous membranes are moist.     Pharynx: Oropharynx is clear.  Eyes:     Extraocular Movements: Extraocular movements intact.     Conjunctiva/sclera: Conjunctivae normal.     Pupils: Pupils are equal, round, and reactive to light.  Cardiovascular:     Rate and Rhythm: Normal rate and regular rhythm.     Pulses: Normal pulses.     Heart sounds: Normal heart sounds.  Pulmonary:     Effort: Pulmonary effort is normal.     Breath sounds: Normal breath sounds.  Abdominal:     Palpations: Abdomen is soft.  Musculoskeletal:        General: Normal range of motion.     Cervical back: Normal range of motion and neck supple.  Lymphadenopathy:     Cervical: No cervical adenopathy.  Skin:    General: Skin is warm and dry.     Capillary Refill: Capillary refill takes less than 2 seconds.  Neurological:     General: No focal deficit present.     Mental Status: She is alert and oriented to person, place, and time.   Psychiatric:        Attention and Perception: Attention and perception normal.        Mood and Affect: Mood is depressed.        Speech: Speech normal.        Behavior: Behavior normal. Behavior is cooperative.        Thought Content: Thought content normal.        Cognition and Memory: Cognition and memory normal.        Judgment: Judgment normal.    Assessment & Plan:  1. Encounter to establish care Appointment today to establish new primary care provider  2. Other fatigue Will check labs for further evaluation.  - CBC with Differential/Platelet; Future - Comp Met (CMET);  Future - Lipid panel; Future - Hemoglobin A1c; Future - Lipid panel - Comp Met (CMET) - CBC with Differential/Platelet - Hemoglobin A1c  3. Abnormal weight gain Check labs. Will develop plan with the patient for weight management at next visit in 2 weeks  - CBC with Differential/Platelet; Future - Comp Met (CMET); Future - Lipid panel; Future - Hemoglobin A1c; Future - Lipid panel - Comp Met (CMET) - CBC with Differential/Platelet - Hemoglobin A1c  4. 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 Primary adenocarcinoma of right lung ([place]) Conitnue regular visits with oncology as scheduled.   6. Acquired hypothyroidism Continue regular visits with endocrinology as scheduled.   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   albuterol (VENTOLIN HFA) 108 (90 Base) MCG/ACT inhaler Inhale 2 puffs into the lungs every 6 (six) hours as needed for wheezing or shortness of breath.   alectinib (ALECENSA) 150 MG capsule Take 4 capsules (600 mg total) by mouth 2 (two) times daily with a meal.   alectinib (ALECENSA) 150 MG capsule TAKE 4 CAPSULES (600 MG TOTAL) BY MOUTH 2 (TWO) TIMES DAILY WITH A MEAL.   ergocalciferol (VITAMIN D2) 1.25 MG (50000 UT) capsule Take 1 capsule (50,000 Units total) by mouth 2 (two) times a week.   hydrocortisone cream 1 % Apply 1 application topically 2 (two) times daily.   hydrOXYzine (ATARAX/VISTARIL) 25 MG tablet Take 1 tablet (25 mg total) by mouth 3 (three) times daily as needed for anxiety. **NEEDS APT FOR FURTHER REFILL**   levothyroxine (SYNTHROID) 88 MCG tablet Take 1 tablet (88 mcg total) by mouth daily.   No facility-administered encounter medications on file as of 05/11/2021.    Follow-up: Return in about 2 weeks (around 05/25/2021) for health maintenance exam.   Ronnell [place], NP

## 2021-05-12 LAB — CBC WITH DIFFERENTIAL/PLATELET
Basophils Absolute: 0.1 10*3/uL (ref 0.0–0.2)
Basos: 1 %
EOS (ABSOLUTE): 0.1 10*3/uL (ref 0.0–0.4)
Eos: 1 %
Hematocrit: 33.8 % — ABNORMAL LOW (ref 34.0–46.6)
Hemoglobin: 11 g/dL — ABNORMAL LOW (ref 11.1–15.9)
Immature Grans (Abs): 0 10*3/uL (ref 0.0–0.1)
Immature Granulocytes: 0 %
Lymphocytes Absolute: 2.8 10*3/uL (ref 0.7–3.1)
Lymphs: 29 %
MCH: 29.6 pg (ref 26.6–33.0)
MCHC: 32.5 g/dL (ref 31.5–35.7)
MCV: 91 fL (ref 79–97)
Monocytes Absolute: 0.7 10*3/uL (ref 0.1–0.9)
Monocytes: 7 %
Neutrophils Absolute: 5.9 10*3/uL (ref 1.4–7.0)
Neutrophils: 62 %
Platelets: 328 10*3/uL (ref 150–450)
RBC: 3.71 x10E6/uL — ABNORMAL LOW (ref 3.77–5.28)
RDW: 13.5 % (ref 11.7–15.4)
WBC: 9.6 10*3/uL (ref 3.4–10.8)

## 2021-05-12 LAB — COMPREHENSIVE METABOLIC PANEL
ALT: 33 IU/L — ABNORMAL HIGH (ref 0–32)
AST: 33 IU/L (ref 0–40)
Albumin/Globulin Ratio: 1.5 (ref 1.2–2.2)
Albumin: 4.2 g/dL (ref 3.8–4.8)
Alkaline Phosphatase: 187 IU/L — ABNORMAL HIGH (ref 44–121)
BUN/Creatinine Ratio: 12 (ref 9–23)
BUN: 9 mg/dL (ref 6–20)
Bilirubin Total: 0.7 mg/dL (ref 0.0–1.2)
CO2: 23 mmol/L (ref 20–29)
Calcium: 9.1 mg/dL (ref 8.7–10.2)
Chloride: 102 mmol/L (ref 96–106)
Creatinine, Ser: 0.75 mg/dL (ref 0.57–1.00)
Globulin, Total: 2.8 g/dL (ref 1.5–4.5)
Glucose: 99 mg/dL (ref 70–99)
Potassium: 4.3 mmol/L (ref 3.5–5.2)
Sodium: 137 mmol/L (ref 134–144)
Total Protein: 7 g/dL (ref 6.0–8.5)
eGFR: 107 mL/min/{1.73_m2} (ref >59)

## 2021-05-12 LAB — HEMOGLOBIN A1C
Est. average glucose Bld gHb Est-mCnc: 100 mg/dL
Hgb A1c MFr Bld: 5.1 % (ref 4.8–5.6)

## 2021-05-12 LAB — LIPID PANEL
Chol/HDL Ratio: 2.5 ratio (ref 0.0–4.4)
Cholesterol, Total: 161 mg/dL (ref 100–199)
HDL: 65 mg/dL (ref >39)
LDL Chol Calc (NIH): 82 mg/dL (ref 0–99)
Triglycerides: 76 mg/dL (ref 0–149)
VLDL Cholesterol Cal: 14 mg/dL (ref 5–40)

## 2021-05-12 NOTE — Progress Notes (Signed)
Mild anemia and mild anemia. Otherwise labs good. Discuss with patient at CPE 05/31/2021

## 2021-05-15 ENCOUNTER — Other Ambulatory Visit (HOSPITAL_COMMUNITY): Payer: Self-pay

## 2021-05-15 NOTE — Progress Notes (Signed)
Please let the patient know that she does have mild anemia. I recommend over the counter iron supplement to bring up levels. We can recheck this in 2 to 3 months. Other labs were normal. Thanks so much.   -HB

## 2021-05-24 ENCOUNTER — Other Ambulatory Visit: Payer: Self-pay

## 2021-05-24 ENCOUNTER — Other Ambulatory Visit (HOSPITAL_COMMUNITY): Payer: Self-pay

## 2021-05-29 ENCOUNTER — Encounter: Payer: Self-pay | Admitting: Internal Medicine

## 2021-05-29 ENCOUNTER — Inpatient Hospital Stay (HOSPITAL_BASED_OUTPATIENT_CLINIC_OR_DEPARTMENT_OTHER): Payer: Medicaid Other | Admitting: Internal Medicine

## 2021-05-29 ENCOUNTER — Other Ambulatory Visit: Payer: Self-pay

## 2021-05-29 ENCOUNTER — Inpatient Hospital Stay: Payer: Medicaid Other | Attending: Internal Medicine

## 2021-05-29 VITALS — BP 138/82 | HR 67 | Temp 96.8°F | Resp 19 | Ht 70.0 in | Wt 295.5 lb

## 2021-05-29 DIAGNOSIS — C3491 Malignant neoplasm of unspecified part of right bronchus or lung: Secondary | ICD-10-CM | POA: Insufficient documentation

## 2021-05-29 DIAGNOSIS — C7931 Secondary malignant neoplasm of brain: Secondary | ICD-10-CM | POA: Diagnosis not present

## 2021-05-29 DIAGNOSIS — C3490 Malignant neoplasm of unspecified part of unspecified bronchus or lung: Secondary | ICD-10-CM

## 2021-05-29 DIAGNOSIS — F32A Depression, unspecified: Secondary | ICD-10-CM | POA: Diagnosis not present

## 2021-05-29 DIAGNOSIS — Z5111 Encounter for antineoplastic chemotherapy: Secondary | ICD-10-CM

## 2021-05-29 LAB — CBC WITH DIFFERENTIAL (CANCER CENTER ONLY)
Abs Immature Granulocytes: 0.01 10*3/uL (ref 0.00–0.07)
Basophils Absolute: 0.1 10*3/uL (ref 0.0–0.1)
Basophils Relative: 1 %
Eosinophils Absolute: 0.1 10*3/uL (ref 0.0–0.5)
Eosinophils Relative: 2 %
HCT: 32.4 % — ABNORMAL LOW (ref 36.0–46.0)
Hemoglobin: 10.6 g/dL — ABNORMAL LOW (ref 12.0–15.0)
Immature Granulocytes: 0 %
Lymphocytes Relative: 39 %
Lymphs Abs: 3 10*3/uL (ref 0.7–4.0)
MCH: 29.4 pg (ref 26.0–34.0)
MCHC: 32.7 g/dL (ref 30.0–36.0)
MCV: 89.8 fL (ref 80.0–100.0)
Monocytes Absolute: 0.6 10*3/uL (ref 0.1–1.0)
Monocytes Relative: 8 %
Neutro Abs: 4 10*3/uL (ref 1.7–7.7)
Neutrophils Relative %: 50 %
Platelet Count: 274 10*3/uL (ref 150–400)
RBC: 3.61 MIL/uL — ABNORMAL LOW (ref 3.87–5.11)
RDW: 13.7 % (ref 11.5–15.5)
WBC Count: 7.8 10*3/uL (ref 4.0–10.5)
nRBC: 0 % (ref 0.0–0.2)

## 2021-05-29 LAB — CMP (CANCER CENTER ONLY)
ALT: 25 U/L (ref 0–44)
AST: 22 U/L (ref 15–41)
Albumin: 4.1 g/dL (ref 3.5–5.0)
Alkaline Phosphatase: 165 U/L — ABNORMAL HIGH (ref 38–126)
Anion gap: 7 (ref 5–15)
BUN: 11 mg/dL (ref 6–20)
CO2: 24 mmol/L (ref 22–32)
Calcium: 8.7 mg/dL — ABNORMAL LOW (ref 8.9–10.3)
Chloride: 108 mmol/L (ref 98–111)
Creatinine: 0.73 mg/dL (ref 0.44–1.00)
GFR, Estimated: >60 mL/min (ref >60)
Glucose, Bld: 92 mg/dL (ref 70–99)
Potassium: 3.9 mmol/L (ref 3.5–5.1)
Sodium: 139 mmol/L (ref 135–145)
Total Bilirubin: 0.9 mg/dL (ref 0.3–1.2)
Total Protein: 7.2 g/dL (ref 6.5–8.1)

## 2021-05-29 NOTE — Progress Notes (Signed)
[facility] [facility] Telephone:([phone]) [phone]   Fax:(336) [phone]  OFFICE PROGRESS NOTE  Ronnell Freshwater, NP [place] [address]  DIAGNOSIS: Stage IV (T3, N2, M1c) non-small cell lung cancer, adenocarcinoma with ALK gene translocation presented with multiple bilateral pulmonary nodules and masses as well as small mediastinal and bilateral axillary lymphadenopathy and metastatic disease to the brain diagnosed in March 2021.  PRIOR THERAPY: None  CURRENT THERAPY: Alecensa (Alectinib) 600 mg p.o. twice daily.  First dose started on July 23 2019.  Status post 22 months of treatment.  INTERVAL HISTORY: Sarah Chandler 36 y.o. female returns to the clinic today for follow-up visit accompanied by her cousin [place].  The patient is feeling fine today with no concerning complaints.  She established care with a primary care physician and she will get referral to psychiatry for evaluation of her depression.  She denied having any current chest pain, shortness of breath except with exertion with no cough or hemoptysis.  She denied having any fever or chills.  She has no nausea, vomiting, diarrhea or constipation.  She has no headache or visual changes.  She continues to tolerate her treatment with Alecensa fairly well.  She is here today for evaluation and repeat blood work.  MEDICAL HISTORY: Past Medical History:  Diagnosis Date   nscl ca dx'd 06/2019   Pneumonia 05/08/2019    ALLERGIES:  is allergic to iron.  MEDICATIONS:  Current Outpatient Medications  Medication Sig Dispense Refill   acetaminophen (TYLENOL) 500 MG tablet Take 1,000 mg by mouth every 6 (six) hours as needed for moderate pain or headache.     albuterol (VENTOLIN HFA) 108 (90 Base) MCG/ACT inhaler Inhale 2 puffs into the lungs every 6 (six) hours as needed for wheezing or shortness of breath. 8 g 1   alectinib (ALECENSA) 150 MG capsule Take 4 capsules (600 mg total) by mouth 2 (two) times  daily with a meal. 240 capsule 5   alectinib (ALECENSA) 150 MG capsule TAKE 4 CAPSULES (600 MG TOTAL) BY MOUTH 2 (TWO) TIMES DAILY WITH A MEAL. 240 capsule 5   ergocalciferol (VITAMIN D2) 1.25 MG (50000 UT) capsule Take 1 capsule (50,000 Units total) by mouth 2 (two) times a week. 26 capsule 2   hydrocortisone cream 1 % Apply 1 application topically 2 (two) times daily. 30 g 0   hydrOXYzine (ATARAX/VISTARIL) 25 MG tablet Take 1 tablet (25 mg total) by mouth 3 (three) times daily as needed for anxiety. **NEEDS APT FOR FURTHER REFILL** 30 tablet 0   levothyroxine (SYNTHROID) 88 MCG tablet Take 1 tablet (88 mcg total) by mouth daily. 90 tablet 3   No current facility-administered medications for this visit.    SURGICAL HISTORY:  Past Surgical History:  Procedure Laterality Date   BIOPSY  07/03/2019   Procedure: BIOPSY;  Surgeon: Rigoberto Noel, MD;  Location: [facility] ENDOSCOPY;  Service: Cardiopulmonary;;   BRONCHIAL BRUSHINGS  07/03/2019   Procedure: BRONCHIAL BRUSHINGS;  Surgeon: Rigoberto Noel, MD;  Location: [facility];  Service: Cardiopulmonary;;   BRONCHIAL WASHINGS  07/03/2019   Procedure: BRONCHIAL WASHINGS;  Surgeon: Rigoberto Noel, MD;  Location: [place];  Service: Cardiopulmonary;;   NO PAST SURGERIES     TOOTH EXTRACTION     VIDEO BRONCHOSCOPY N/A 07/03/2019   Procedure: VIDEO BRONCHOSCOPY WITH FLUORO;  Surgeon: Rigoberto Noel, MD;  Location: [facility];  Service: Cardiopulmonary;  Laterality: N/A;  LMA  REVIEW OF SYSTEMS:  A comprehensive review of systems was negative except for: Constitutional: positive for fatigue Respiratory: positive for dyspnea on exertion   PHYSICAL EXAMINATION: General appearance: alert, cooperative, fatigued, and no distress Head: Normocephalic, without obvious abnormality, atraumatic Neck: no adenopathy, no JVD, supple, symmetrical, trachea midline, and thyroid not enlarged, symmetric, no tenderness/mass/nodules Lymph nodes: Cervical,  supraclavicular, and axillary nodes normal. Resp: clear to auscultation bilaterally Back: symmetric, no curvature. ROM normal. No CVA tenderness. Cardio: regular rate and rhythm, S1, S2 normal, no murmur, click, rub or gallop GI: soft, non-tender; bowel sounds normal; no masses,  no organomegaly Extremities: extremities normal, atraumatic, no cyanosis or edema  ECOG PERFORMANCE STATUS: 1 - Symptomatic but completely ambulatory  Blood pressure 138/82, pulse 67, temperature (!) 96.8 F (36 C), temperature source Tympanic, resp. rate 19, height '5\' 10"'  (1.778 m), weight 295 lb 8 oz (134 kg), SpO2 97 %.  LABORATORY DATA: Lab Results  Component Value Date   WBC 7.8 05/29/2021   HGB 10.6 (L) 05/29/2021   HCT 32.4 (L) 05/29/2021   MCV 89.8 05/29/2021   PLT 274 05/29/2021      Chemistry      Component Value Date/Time   NA 139 05/29/2021 0848   NA 137 05/11/2021 1026   K 3.9 05/29/2021 0848   CL 108 05/29/2021 0848   CO2 24 05/29/2021 0848   BUN 11 05/29/2021 0848   BUN 9 05/11/2021 1026   CREATININE 0.73 05/29/2021 0848      Component Value Date/Time   CALCIUM 8.7 (L) 05/29/2021 0848   ALKPHOS 165 (H) 05/29/2021 0848   AST 22 05/29/2021 0848   ALT 25 05/29/2021 0848   BILITOT 0.9 05/29/2021 0848       RADIOGRAPHIC STUDIES: No results found.   ASSESSMENT AND PLAN: This is a very pleasant 36 years old white female recently diagnosed with stage IV non-small cell lung cancer, adenocarcinoma with positive ALK gene translocation in March 2021.  The patient started treatment with Alecensa on July 23, 2019 and within few days she started not significant improvement in her condition with less shortness of breath as well as decrease in supraclavicular lymphadenopathy.  She is currently on treatment with Alecensa 600 mg p.o. twice daily status post 22 months of treatment. The patient continues to tolerate this treatment well with no concerning adverse effects. I recommended for her to  continue her current treatment with Alecensa (Alectinib) with the same dose for now. I will see her back for follow-up visit in 1 months for evaluation and repeat CT scan of the chest, abdomen and pelvis for restaging of her disease. The patient was advised to call immediately if she has any other concerning symptoms in the interval. The patient voices understanding of current disease status and treatment options and is in agreement with the current care plan.  All questions were answered. The patient knows to call the clinic with any problems, questions or concerns. We can certainly see the patient much sooner if necessary.  Disclaimer: This note was dictated with voice recognition software. Similar sounding words can inadvertently be transcribed and may not be corrected upon review.

## 2021-05-31 ENCOUNTER — Encounter: Payer: Self-pay | Admitting: Nurse Practitioner

## 2021-05-31 ENCOUNTER — Other Ambulatory Visit: Payer: Self-pay

## 2021-05-31 ENCOUNTER — Ambulatory Visit (INDEPENDENT_AMBULATORY_CARE_PROVIDER_SITE_OTHER): Payer: Medicaid Other | Admitting: Nurse Practitioner

## 2021-05-31 VITALS — BP 101/70 | HR 92 | Temp 98.4°F | Ht 70.0 in | Wt 292.9 lb

## 2021-05-31 DIAGNOSIS — F322 Major depressive disorder, single episode, severe without psychotic features: Secondary | ICD-10-CM

## 2021-05-31 DIAGNOSIS — Z0001 Encounter for general adult medical examination with abnormal findings: Secondary | ICD-10-CM

## 2021-05-31 DIAGNOSIS — C3491 Malignant neoplasm of unspecified part of right bronchus or lung: Secondary | ICD-10-CM

## 2021-05-31 DIAGNOSIS — D509 Iron deficiency anemia, unspecified: Secondary | ICD-10-CM

## 2021-05-31 DIAGNOSIS — Z6841 Body Mass Index (BMI) 40.0 and over, adult: Secondary | ICD-10-CM

## 2021-05-31 NOTE — Progress Notes (Signed)
Established patient visit   Patient: Sarah Chandler   DOB: 1985/06/10   36 y.o. Female  MRN: [id number] Visit Date: 05/31/2021   Chief Complaint  Patient presents with   Annual Exam   Subjective    HPI  The patient is here for annual wellness visit. She is struggling with issues regarding mental health. Has been referred to psychiatry. Waiting to hear about initial appointment.  -mildly anemic with labs. Now taking iron supplement.  -routine, fasting labs done.   -normal lipids and sugar. HgbA1c 5.1  -very mild elevation of ALT. Mild elevation alkaline phosphatase which is at baseline.   -struggling with weight. Has increased physical activity. Has lost 5 pounds this month.    Medications: Outpatient Medications Prior to Visit  Medication Sig   acetaminophen (TYLENOL) 500 MG tablet Take 1,000 mg by mouth every 6 (six) hours as needed for moderate pain or headache.   albuterol (VENTOLIN HFA) 108 (90 Base) MCG/ACT inhaler Inhale 2 puffs into the lungs every 6 (six) hours as needed for wheezing or shortness of breath.   alectinib (ALECENSA) 150 MG capsule Take 4 capsules (600 mg total) by mouth 2 (two) times daily with a meal.   alectinib (ALECENSA) 150 MG capsule TAKE 4 CAPSULES (600 MG TOTAL) BY MOUTH 2 (TWO) TIMES DAILY WITH A MEAL.   ergocalciferol (VITAMIN D2) 1.25 MG (50000 UT) capsule Take 1 capsule (50,000 Units total) by mouth 2 (two) times a week.   hydrocortisone cream 1 % Apply 1 application topically 2 (two) times daily.   hydrOXYzine (ATARAX/VISTARIL) 25 MG tablet Take 1 tablet (25 mg total) by mouth 3 (three) times daily as needed for anxiety. **NEEDS APT FOR FURTHER REFILL**   levothyroxine (SYNTHROID) 88 MCG tablet Take 1 tablet (88 mcg total) by mouth daily.   No facility-administered medications prior to visit.    Review of Systems  Constitutional:  Positive for fatigue. Negative for activity change, appetite change, chills and fever.  HENT:  Negative for  congestion, postnasal drip, rhinorrhea, sinus pressure, sinus pain, sneezing and sore throat.   Eyes: Negative.   Respiratory:  Negative for cough, chest tightness, shortness of breath and wheezing.   Cardiovascular:  Negative for chest pain and palpitations.  Gastrointestinal:  Negative for abdominal pain, constipation, diarrhea, nausea and vomiting.  Endocrine: Negative for cold intolerance, heat intolerance, polydipsia and polyuria.  Genitourinary:  Negative for dyspareunia, dysuria, flank pain, frequency and urgency.  Musculoskeletal:  Negative for arthralgias, back pain and myalgias.  Skin:  Negative for rash.  Allergic/Immunologic: Negative for environmental allergies.  Neurological:  Negative for dizziness, weakness and headaches.  Hematological:  Negative for adenopathy.  Psychiatric/Behavioral:  Positive for dysphoric mood and sleep disturbance. The patient is nervous/anxious.    Last CBC Lab Results  Component Value Date   WBC 7.8 05/29/2021   HGB 10.6 (L) 05/29/2021   HCT 32.4 (L) 05/29/2021   MCV 89.8 05/29/2021   MCH 29.4 05/29/2021   RDW 13.7 05/29/2021   PLT 274 34/19/3790   Last metabolic panel Lab Results  Component Value Date   GLUCOSE 92 05/29/2021   NA 139 05/29/2021   K 3.9 05/29/2021   CL 108 05/29/2021   CO2 24 05/29/2021   BUN 11 05/29/2021   CREATININE 0.73 05/29/2021   GFRNONAA >60 05/29/2021   CALCIUM 8.7 (L) 05/29/2021   PHOS 4.2 07/14/2019   PROT 7.2 05/29/2021   ALBUMIN 4.1 05/29/2021   LABGLOB 2.8 05/11/2021   AGRATIO 1.5  05/11/2021   BILITOT 0.9 05/29/2021   ALKPHOS 165 (H) 05/29/2021   AST 22 05/29/2021   ALT 25 05/29/2021   ANIONGAP 7 05/29/2021   Last lipids Lab Results  Component Value Date   CHOL 161 05/11/2021   HDL 65 05/11/2021   LDLCALC 82 05/11/2021   TRIG 76 05/11/2021   CHOLHDL 2.5 05/11/2021   Last hemoglobin A1c Lab Results  Component Value Date   HGBA1C 5.1 05/11/2021   Last thyroid functions Lab Results   Component Value Date   TSH 4.12 01/30/2021   T3TOTAL 149 05/08/2019   Last vitamin B12 and Folate Lab Results  Component Value Date   VITAMINB12 429 07/03/2019   FOLATE 24.7 07/03/2019       Objective     Today's Vitals   05/31/21 1331  BP: 101/70  Pulse: 92  Temp: 98.4 F (36.9 C)  SpO2: 98%  Weight: 292 lb 14.4 oz (132.9 kg)  Height: 5\' 10"  (1.778 m)   Body mass index is 42.03 kg/m.   BP Readings from Last 3 Encounters:  05/31/21 101/70  05/29/21 138/82  05/11/21 (!) 144/67    Wt Readings from Last 3 Encounters:  05/31/21 292 lb 14.4 oz (132.9 kg)  05/29/21 295 lb 8 oz (134 kg)  05/11/21 297 lb 12.8 oz (135.1 kg)    Physical Exam Vitals and nursing note reviewed.  Constitutional:      Appearance: Normal appearance. She is well-developed. She is obese.  HENT:     Head: Normocephalic and atraumatic.     Right Ear: Tympanic membrane, ear canal and external ear normal.     Left Ear: Tympanic membrane, ear canal and external ear normal.     Nose: Nose normal.     Mouth/Throat:     Mouth: Mucous membranes are moist.     Pharynx: Oropharynx is clear.  Eyes:     Extraocular Movements: Extraocular movements intact.     Conjunctiva/sclera: Conjunctivae normal.     Pupils: Pupils are equal, round, and reactive to light.  Cardiovascular:     Rate and Rhythm: Normal rate and regular rhythm.     Pulses: Normal pulses.     Heart sounds: Normal heart sounds.  Pulmonary:     Effort: Pulmonary effort is normal.     Breath sounds: Normal breath sounds.  Abdominal:     General: Bowel sounds are normal. There is no distension.     Palpations: Abdomen is soft. There is no mass.     Tenderness: There is no abdominal tenderness. There is no guarding or rebound.     Hernia: No hernia is present.  Musculoskeletal:        General: Normal range of motion.     Cervical back: Normal range of motion and neck supple.  Lymphadenopathy:     Cervical: No cervical adenopathy.   Skin:    General: Skin is warm and dry.     Capillary Refill: Capillary refill takes less than 2 seconds.  Neurological:     General: No focal deficit present.     Mental Status: She is alert and oriented to person, place, and time.  Psychiatric:        Attention and Perception: Attention and perception normal.        Mood and Affect: Mood is anxious and depressed.        Speech: Speech normal.        Behavior: Behavior normal. Behavior is cooperative.  Thought Content: Thought content normal.        Cognition and Memory: Cognition and memory normal.        Judgment: Judgment normal.      Assessment & Plan    1. Encounter for general adult medical examination with abnormal findings Annual wellness visit today.  Patient declined breast exam or Pap smear today.  2. Iron deficiency anemia, unspecified iron deficiency anemia type Mild decreased hemoglobin hematocrit today.  Recommend follow and supplementation daily.  3. 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  4. Body mass index (BMI) of 40.1-44.9 in adult [facility]) Discussed lowering calorie intake to 1500 calories per day and incorporating exercise into daily routine to help lose weight.   5. Primary adenocarcinoma of right lung ([facility]) Stable.  Continue regular visits with oncology.   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         Ronnell Freshwater, NP  [facility] [phone] (phone) [phone] (fax)  [place]

## 2021-05-31 NOTE — Patient Instructions (Addendum)

## 2021-06-04 DIAGNOSIS — D509 Iron deficiency anemia, unspecified: Secondary | ICD-10-CM | POA: Insufficient documentation

## 2021-06-04 DIAGNOSIS — Z6841 Body Mass Index (BMI) 40.0 and over, adult: Secondary | ICD-10-CM | POA: Insufficient documentation

## 2021-06-16 ENCOUNTER — Other Ambulatory Visit (HOSPITAL_COMMUNITY): Payer: Self-pay

## 2021-06-22 ENCOUNTER — Other Ambulatory Visit (HOSPITAL_COMMUNITY): Payer: Self-pay

## 2021-06-23 ENCOUNTER — Other Ambulatory Visit: Payer: Self-pay

## 2021-06-23 ENCOUNTER — Ambulatory Visit (HOSPITAL_COMMUNITY)
Admission: RE | Admit: 2021-06-23 | Discharge: 2021-06-23 | Disposition: A | Discharge status: Home / Self Care | Payer: Medicaid Other | Source: Ambulatory Visit | Attending: Internal Medicine | Admitting: Internal Medicine

## 2021-06-23 DIAGNOSIS — R918 Other nonspecific abnormal finding of lung field: Secondary | ICD-10-CM | POA: Diagnosis not present

## 2021-06-23 DIAGNOSIS — C3490 Malignant neoplasm of unspecified part of unspecified bronchus or lung: Secondary | ICD-10-CM | POA: Diagnosis not present

## 2021-06-23 DIAGNOSIS — C7951 Secondary malignant neoplasm of bone: Secondary | ICD-10-CM | POA: Diagnosis not present

## 2021-06-23 MED ORDER — IOHEXOL 300 MG/ML  SOLN
100.0000 mL | Freq: Once | INTRAMUSCULAR | Status: AC | PRN
  Administered 2021-06-23: 100 mL via INTRAVENOUS

## 2021-06-23 MED ORDER — SODIUM CHLORIDE (PF) 0.9 % IJ SOLN
INTRAMUSCULAR | Status: AC
  Filled 2021-06-23: qty 50

## 2021-06-27 ENCOUNTER — Other Ambulatory Visit (HOSPITAL_COMMUNITY): Payer: Self-pay

## 2021-07-11 ENCOUNTER — Other Ambulatory Visit (HOSPITAL_COMMUNITY): Payer: Self-pay

## 2021-07-19 ENCOUNTER — Other Ambulatory Visit (HOSPITAL_COMMUNITY): Payer: Self-pay

## 2021-08-02 ENCOUNTER — Encounter: Payer: Self-pay | Admitting: Nurse Practitioner

## 2021-08-02 ENCOUNTER — Ambulatory Visit (INDEPENDENT_AMBULATORY_CARE_PROVIDER_SITE_OTHER): Payer: Medicaid Other | Admitting: Nurse Practitioner

## 2021-08-02 ENCOUNTER — Encounter: Payer: Self-pay | Admitting: Internal Medicine

## 2021-08-02 ENCOUNTER — Ambulatory Visit (INDEPENDENT_AMBULATORY_CARE_PROVIDER_SITE_OTHER): Payer: Medicaid Other | Admitting: Internal Medicine

## 2021-08-02 VITALS — BP 113/69 | HR 71 | Temp 97.9°F | Ht 70.0 in | Wt 261.8 lb

## 2021-08-02 VITALS — BP 104/72 | HR 78 | Ht 70.0 in | Wt 290.0 lb

## 2021-08-02 DIAGNOSIS — Z8639 Personal history of other endocrine, nutritional and metabolic disease: Secondary | ICD-10-CM

## 2021-08-02 DIAGNOSIS — E559 Vitamin D deficiency, unspecified: Secondary | ICD-10-CM | POA: Diagnosis not present

## 2021-08-02 DIAGNOSIS — L989 Disorder of the skin and subcutaneous tissue, unspecified: Secondary | ICD-10-CM | POA: Diagnosis not present

## 2021-08-02 DIAGNOSIS — L02818 Cutaneous abscess of other sites: Secondary | ICD-10-CM

## 2021-08-02 DIAGNOSIS — E039 Hypothyroidism, unspecified: Secondary | ICD-10-CM | POA: Diagnosis not present

## 2021-08-02 LAB — TSH: TSH: 2.5 u[IU]/mL (ref 0.35–5.50)

## 2021-08-02 LAB — VITAMIN D 25 HYDROXY (VIT D DEFICIENCY, FRACTURES): VITD: 39.3 ng/mL (ref 30.00–100.00)

## 2021-08-02 MED ORDER — SULFAMETHOXAZOLE-TRIMETHOPRIM 800-160 MG PO TABS: 1.0000 | ORAL_TABLET | Freq: Two times a day (BID) | ORAL | 0 refills | Status: DC

## 2021-08-02 MED ORDER — LEVOTHYROXINE SODIUM 88 MCG PO TABS
88.0000 ug | ORAL_TABLET | Freq: Every day | ORAL | 3 refills | Status: DC
Start: 2021-08-02 — End: 2022-07-31

## 2021-08-02 MED ORDER — ERGOCALCIFEROL 1.25 MG (50000 UT) PO CAPS: 50000.0000 [IU] | ORAL_CAPSULE | ORAL | 3 refills | Status: DC

## 2021-08-02 MED ORDER — LIDOCAINE VISCOUS HCL 2 % MT SOLN: OROMUCOSAL | 0 refills | Status: DC

## 2021-08-02 NOTE — Progress Notes (Signed)
Established patient visit ? ? ?Patient: Sarah Chandler   DOB: 05-28-1985   36 y.o. Female  MRN: [id number] ?Visit Date: 08/02/2021 ? ?Chief Complaint  ?Patient presents with  ? Cyst  ? ?Subjective  ?  ?HPI  ?Patient states that she has irritated area of skin under the right arm. Has been there for three to four days. Initially thought this was infected hair, but looks different. She states that the skin is intact. She has not noted any drainage. She denies fever or body aches.  ? ?Medications: ?Outpatient Medications Prior to Visit  ?Medication Sig  ? acetaminophen (TYLENOL) 500 MG tablet Take 1,000 mg by mouth every 6 (six) hours as needed for moderate pain or headache.  ? albuterol (VENTOLIN HFA) 108 (90 Base) MCG/ACT inhaler Inhale 2 puffs into the lungs every 6 (six) hours as needed for wheezing or shortness of breath.  ? alectinib (ALECENSA) 150 MG capsule Take 4 capsules (600 mg total) by mouth 2 (two) times daily with a meal.  ? alectinib (ALECENSA) 150 MG capsule TAKE 4 CAPSULES (600 MG TOTAL) BY MOUTH 2 (TWO) TIMES DAILY WITH A MEAL.  ? hydrocortisone cream 1 % Apply 1 application topically 2 (two) times daily.  ? hydrOXYzine (ATARAX/VISTARIL) 25 MG tablet Take 1 tablet (25 mg total) by mouth 3 (three) times daily as needed for anxiety. **NEEDS APT FOR FURTHER REFILL**  ? [DISCONTINUED] ergocalciferol (VITAMIN D2) 1.25 MG (50000 UT) capsule Take 1 capsule (50,000 Units total) by mouth 2 (two) times a week.  ? [DISCONTINUED] levothyroxine (SYNTHROID) 88 MCG tablet Take 1 tablet (88 mcg total) by mouth daily.  ? ?No facility-administered medications prior to visit.  ? ? ?Review of Systems  ?Constitutional:  Negative for activity change, appetite change, chills, fatigue and fever.  ?HENT:  Negative for congestion, postnasal drip, rhinorrhea, sinus pressure, sinus pain, sneezing and sore throat.   ?Eyes: Negative.   ?Respiratory:  Negative for cough, chest tightness, shortness of breath and wheezing.    ?Cardiovascular:  Negative for chest pain and palpitations.  ?Gastrointestinal:  Negative for abdominal pain, constipation, diarrhea, nausea and vomiting.  ?Endocrine: Negative for cold intolerance, heat intolerance, polydipsia and polyuria.  ?Genitourinary:  Negative for dyspareunia, dysuria, flank pain, frequency and urgency.  ?Musculoskeletal:  Negative for arthralgias, back pain and myalgias.  ?Skin:  Negative for rash.  ?     Blisterlike lesions near the right arm.  Painful.  She states skin is intact and has not noted any drainage.  ?Allergic/Immunologic: Negative for environmental allergies.  ?Neurological:  Negative for dizziness, weakness and headaches.  ?Hematological:  Negative for adenopathy.  ?Psychiatric/Behavioral:  The patient is not nervous/anxious.   ? ? Objective  ?  ? ?Today's Vitals  ? 08/02/21 1312  ?BP: 113/69  ?Pulse: 71  ?Temp: 97.9 ?F (36.6 ?C)  ?SpO2: 98%  ?Weight: 261 lb 12.8 oz (118.8 kg)  ?Height: 5\' 10"  (1.778 m)  ? ?Body mass index is 37.56 kg/m?.  ? ?Physical Exam ?Vitals and nursing note reviewed.  ?Constitutional:   ?   Appearance: Normal appearance. She is well-developed. She is obese.  ?HENT:  ?   Head: Normocephalic and atraumatic.  ?   Nose: No rhinorrhea.  ?   Mouth/Throat:  ?   Mouth: Mucous membranes are moist.  ?   Pharynx: Oropharynx is clear.  ?Eyes:  ?   Extraocular Movements: Extraocular movements intact.  ?   Conjunctiva/sclera: Conjunctivae normal.  ?   Pupils: Pupils are  equal, round, and reactive to light.  ?Cardiovascular:  ?   Rate and Rhythm: Normal rate and regular rhythm.  ?   Pulses: Normal pulses.  ?   Heart sounds: Normal heart sounds.  ?Pulmonary:  ?   Effort: Pulmonary effort is normal.  ?   Breath sounds: Normal breath sounds.  ?Chest:  ? ? ?Abdominal:  ?   Palpations: Abdomen is soft.  ?Musculoskeletal:     ?   General: Normal range of motion.  ?   Cervical back: Normal range of motion and neck supple.  ?Lymphadenopathy:  ?   Cervical: No cervical  adenopathy.  ?Skin: ?   General: Skin is warm and dry.  ?   Capillary Refill: Capillary refill takes less than 2 seconds.  ?Neurological:  ?   General: No focal deficit present.  ?   Mental Status: She is alert and oriented to person, place, and time.  ?Psychiatric:     ?   Mood and Affect: Mood normal.     ?   Behavior: Behavior normal.     ?   Thought Content: Thought content normal.     ?   Judgment: Judgment normal.  ?  ? ? ?Results for orders placed or performed in visit on 08/02/21  ?VITAMIN D 25 Hydroxy (Vit-D Deficiency, Fractures)  ?Result Value Ref Range  ? VITD 39.30 30.00 - 100.00 ng/mL  ?TSH  ?Result Value Ref Range  ? TSH 2.50 0.35 - 5.50 uIU/mL  ? ? Assessment & Plan  ?  ?1. Cutaneous abscess of other site ?Start Bactrim DS twice daily for next 10 days.  May apply viscous lidocaine to affected area 3-4 times daily as needed for pain.  Advised patient to notify the office if condition worsens over the next week.  Maintenance referral to dermatology or general surgery.  She voiced understanding and agreement. ?- sulfamethoxazole-trimethoprim (BACTRIM DS) 800-160 MG tablet; Take 1 tablet by mouth 2 (two) times daily.  Dispense: 20 tablet; Refill: 0 ?- lidocaine (XYLOCAINE) 2 % solution; Apply small amount to effected area 3 to 4 times per day as needed for pain  Dispense: 100 mL; Refill: 0 ? ?2. Painful skin lesion ?May apply viscous lidocaine to affected area 3-4 times daily as needed for pain. ?- lidocaine (XYLOCAINE) 2 % solution; Apply small amount to effected area 3 to 4 times per day as needed for pain  Dispense: 100 mL; Refill: 0  ? ?Return for prn worsening or persistent symptoms.  ?   ? ? ? ?Ronnell Freshwater, NP  ?[place] [facility] ?[phone] (phone) ?[phone] (fax) ? ?[facility] ?

## 2021-08-02 NOTE — Progress Notes (Signed)
? ?Name: Sarah Chandler  ?MRN/ DOB: [id number], 1985/10/29    ?Age/ Sex: 36 y.o., female   ? ? ?PCP: Ronnell Freshwater, NP   ?Reason for Endocrinology Evaluation: Hyperthyroidism  ?   ?Initial Endocrinology Clinic Visit: 06/08/2019  ? ? 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 She has followed with [facility] since 06/08/2019 for consultative assistance with management of her hyperthyroidism.  ? ?HISTORICAL SUMMARY:  ?Pt presented to the ED on 05/10/2019 with palpitations and fatigue. She was noted to have a suppressed TSH < 0.005 uIU/mL with elevated FT4 at 1.70 ng/dL .She was started on Methimazole at the time.  ? ?In the meantime she was diagnosed with metastatic lung ca and was on Alectinib. TSH trended up to 91 uIU/mL by 09/2019 and methimazole was stopped.  ? ?LT-4 replacement was started 12/2019 ? ?No FH of thyroid disease ?SUBJECTIVE:  ? ?Today (08/02/2021):  [name] is here for a follow up on hypothyroidism  ? ?She continues on  Alectinib started 07/2019 for metastatic lung cancer , so Dr. Earlie Server with oncology on May 29, 2021.  See T imaging 06/2021 shows stability of lung lesions ? ?Weight has been stable  ?Denies constipation or diarrhea  ?Denies local neck symptoms  ?Has not seen a therapist yet  ?Has been sleeping well at night  ?Has occasional cough due to allergies but no sob  ? ? ?Levothyroxine 88 mcg daily  ?Ergocalciferol 50, 000 iu twice weekly  ( MOndays )  ? ? ? ?HISTORY:  ?Past Medical History:  ?Past Medical History:  ?Diagnosis Date  ? nscl ca dx'd 06/2019  ? Pneumonia 05/08/2019  ? ?Past Surgical History:  ?Past Surgical History:  ?Procedure Laterality Date  ? BIOPSY  07/03/2019  ? Procedure: BIOPSY;  Surgeon: Rigoberto Noel, MD;  Location: [facility];  Service: Cardiopulmonary;;  ? BRONCHIAL BRUSHINGS  07/03/2019  ? Procedure: BRONCHIAL BRUSHINGS;  Surgeon:  Rigoberto Noel, MD;  Location: [facility] ENDOSCOPY;  Service: Cardiopulmonary;;  ? BRONCHIAL WASHINGS  07/03/2019  ? Procedure: BRONCHIAL WASHINGS;  Surgeon: Rigoberto Noel, MD;  Location: [facility] ENDOSCOPY;  Service: Cardiopulmonary;;  ? NO PAST SURGERIES    ? TOOTH EXTRACTION    ? VIDEO BRONCHOSCOPY N/A 07/03/2019  ? Procedure: VIDEO BRONCHOSCOPY WITH FLUORO;  Surgeon: Rigoberto Noel, MD;  Location: [place];  Service: Cardiopulmonary;  Laterality: N/A;  LMA  ? ?Social History:  reports that she has never smoked. She has never used smokeless tobacco. She reports current alcohol use. She reports that she does not use drugs. ?Family History:  ?Family History  ?Problem Relation Age of Onset  ? Diabetes Father   ? Hyperlipidemia Father   ? Hypertension Father   ? Depression Maternal Uncle   ? Diabetes Maternal Uncle   ? Cancer Maternal Grandmother   ? Hyperlipidemia Maternal Grandmother   ? Cancer Paternal Grandfather   ? ? ? ?HOME MEDICATIONS: ?Allergies as of 08/02/2021   ? ?   Reactions  ? Iron Other (See Comments)  ? Ferrosol - Liquid  ?Unknown reaction   ? ?  ? ?  ?Medication List  ?  ? ?  ? Accurate as of August 02, 2021  4:37 PM. If you have any questions, ask your nurse or doctor.  ?  ?  ? ?  ? ?acetaminophen 500 MG tablet ?Commonly known as: TYLENOL ?Take  1,000 mg by mouth every 6 (six) hours as needed for moderate pain or headache. ?  ?albuterol 108 (90 Base) MCG/ACT inhaler ?Commonly known as: VENTOLIN HFA ?Inhale 2 puffs into the lungs every 6 (six) hours as needed for wheezing or shortness of breath. ?  ?alectinib 150 MG capsule ?Commonly known as: ALECENSA ?Take 4 capsules (600 mg total) by mouth 2 (two) times daily with a meal. ?  ?Alecensa 150 MG capsule ?Generic drug: alectinib ?TAKE 4 CAPSULES (600 MG TOTAL) BY MOUTH 2 (TWO) TIMES DAILY WITH A MEAL. ?  ?ergocalciferol 1.25 MG (50000 UT) capsule ?Commonly known as: VITAMIN D2 ?Take 1 capsule (50,000 Units total) by mouth 2 (two) times a week. ?  ?hydrocortisone  cream 1 % ?Apply 1 application topically 2 (two) times daily. ?  ?hydrOXYzine 25 MG tablet ?Commonly known as: ATARAX ?Take 1 tablet (25 mg total) by mouth 3 (three) times daily as needed for anxiety. **NEEDS APT FOR FURTHER REFILL** ?  ?levothyroxine 88 MCG tablet ?Commonly known as: SYNTHROID ?Take 1 tablet (88 mcg total) by mouth daily. ?  ?lidocaine 2 % solution ?Commonly known as: XYLOCAINE ?Apply small amount to effected area 3 to 4 times per day as needed for pain ?Started by: Ronnell Freshwater, NP ?  ?sulfamethoxazole-trimethoprim 800-160 MG tablet ?Commonly known as: BACTRIM DS ?Take 1 tablet by mouth 2 (two) times daily. ?Started by: Ronnell Freshwater, NP ?  ? ?  ? ? ? ? ?OBJECTIVE:  ? ?PHYSICAL EXAM: ?VS: BP 104/72 (BP Location: Left Arm, Patient Position: Sitting, Cuff Size: Large)   Pulse 78   Ht 5\' 10"  (1.778 m)   Wt 290 lb (131.5 kg)   LMP 07/24/2021   SpO2 99%   BMI 41.61 kg/m?   ? ?EXAM: ?General: Pt appears well and is in NAD ?Right eye stare but no exophthalmos  ?Neck: General: Supple without adenopathy. ?Thyroid: Thyroid size normal.  No goiter or nodules appreciated.   ?Lungs: Clear with good BS bilat with no rales, rhonchi, or wheezes  ?Heart: Auscultation: RRR.  ?Abdomen: Normoactive bowel sounds, soft, nontender, without masses or organomegaly palpable  ?Skin: Erythematous macular rash over the left shin   ?Extremities:  ?BL LE: No pretibial edema normal ROM and strength.  ? ? ? ?DATA REVIEWED: ? Latest Reference Range & Units 08/02/21 09:18  ?VITD 30.00 - 100.00 ng/mL 39.30  ? ? Latest Reference Range & Units 08/02/21 09:18  ?TSH 0.35 - 5.50 uIU/mL 2.50  ? ? ?Results for ANITTA, TENNY (MRN [id number]) as of 12/25/2019 09:03 ? Ref. Range 06/08/2019 14:52  ?TRAB Latest Ref Range: <=2.00 IU/L 5.19 (H)  ? ?ASSESSMENT / PLAN / RECOMMENDATIONS:  ? ?Hypothyroidism : ? ? ? ?- She was initially diagnosed with hyperthyroidism secondary to Graves' disease in 04/2019, she had to be started on  Alectinib in 07/2019 for metastatic lung cancer and by 09/2019 she was off Methimazole followed by hypothyroidism . I suspect Alectinib may have to do with hypothyroidism development.  ?- No local neck symptoms  ?- TSH is normal, no change ? ? ? ?Medication ? ?Continue levothyroxine 88 MCG daily ? ?2. Hx of Graves' Disease: ? ?-No extrathyroidal manifestations of Graves' disease ? ?3. Vitamin D deficiency: ? ?- Repeat Vitamin D is normal ? ?Medication  ? - Continue ergocalciferol 50,000 iu twice weekly  ? ? ? ?F/U in 1 year ? ? ? ?Signed electronically by: ?Abby Nena Jordan, MD ? ?[facility]  ?[facility] ?[address] ?[place], [place] [address] ?Phone: [phone] ?FAX: [phone]  ? ? ? ? ?CC: ?Ronnell Freshwater, NP ?[place] ?[place] [address] ?Phone: [phone]  ?Fax: [phone] ? ? ?Return to Endocrinology clinic as below: ?Future Appointments  ?Date Time Provider Amorita  ?08/28/2021  8:30 AM [facility] None  ?06/04/2022  8:30 AM Ronnell Freshwater, NP [facility] None  ?07/31/2022  8:10 AM Latishia Suitt, Melanie Crazier, MD [facility] None  ?  ? ?

## 2021-08-02 NOTE — Patient Instructions (Signed)

## 2021-08-06 DIAGNOSIS — L0291 Cutaneous abscess, unspecified: Secondary | ICD-10-CM | POA: Insufficient documentation

## 2021-08-06 DIAGNOSIS — L989 Disorder of the skin and subcutaneous tissue, unspecified: Secondary | ICD-10-CM | POA: Insufficient documentation

## 2021-08-08 ENCOUNTER — Other Ambulatory Visit (HOSPITAL_COMMUNITY): Payer: Self-pay

## 2021-08-08 ENCOUNTER — Other Ambulatory Visit: Payer: Self-pay | Admitting: Internal Medicine

## 2021-08-08 MED ORDER — ALECENSA 150 MG PO CAPS
ORAL_CAPSULE | ORAL | 5 refills | Status: DC
  Filled 2021-08-08: qty 240, 30d supply, fill #0
  Filled 2021-09-06: qty 240, 30d supply, fill #1
  Filled 2021-10-02: qty 240, 30d supply, fill #2
  Filled 2021-11-02: qty 240, 30d supply, fill #3
  Filled 2021-11-29: qty 240, 30d supply, fill #4

## 2021-08-14 ENCOUNTER — Other Ambulatory Visit (HOSPITAL_COMMUNITY): Payer: Self-pay

## 2021-08-17 ENCOUNTER — Other Ambulatory Visit (HOSPITAL_COMMUNITY): Payer: Self-pay

## 2021-08-23 ENCOUNTER — Other Ambulatory Visit: Payer: Self-pay

## 2021-08-23 DIAGNOSIS — R748 Abnormal levels of other serum enzymes: Secondary | ICD-10-CM

## 2021-08-23 DIAGNOSIS — D509 Iron deficiency anemia, unspecified: Secondary | ICD-10-CM

## 2021-08-28 ENCOUNTER — Other Ambulatory Visit: Payer: Medicaid Other

## 2021-08-28 DIAGNOSIS — D509 Iron deficiency anemia, unspecified: Secondary | ICD-10-CM | POA: Diagnosis not present

## 2021-08-28 DIAGNOSIS — R748 Abnormal levels of other serum enzymes: Secondary | ICD-10-CM | POA: Diagnosis not present

## 2021-08-29 ENCOUNTER — Encounter: Payer: Self-pay | Admitting: Nurse Practitioner

## 2021-08-29 LAB — COMPREHENSIVE METABOLIC PANEL
ALT: 27 IU/L (ref 0–32)
AST: 22 IU/L (ref 0–40)
Albumin/Globulin Ratio: 1.5 (ref 1.2–2.2)
Albumin: 4.1 g/dL (ref 3.8–4.8)
Alkaline Phosphatase: 191 IU/L — ABNORMAL HIGH (ref 44–121)
BUN/Creatinine Ratio: 9 (ref 9–23)
BUN: 8 mg/dL (ref 6–20)
Bilirubin Total: 1 mg/dL (ref 0.0–1.2)
CO2: 23 mmol/L (ref 20–29)
Calcium: 9.5 mg/dL (ref 8.7–10.2)
Chloride: 106 mmol/L (ref 96–106)
Creatinine, Ser: 0.86 mg/dL (ref 0.57–1.00)
Globulin, Total: 2.8 g/dL (ref 1.5–4.5)
Glucose: 81 mg/dL (ref 70–99)
Potassium: 4.2 mmol/L (ref 3.5–5.2)
Sodium: 143 mmol/L (ref 134–144)
Total Protein: 6.9 g/dL (ref 6.0–8.5)
eGFR: 91 mL/min/{1.73_m2} (ref >59)

## 2021-08-29 LAB — CBC WITH DIFFERENTIAL/PLATELET
Basophils Absolute: 0.1 10*3/uL (ref 0.0–0.2)
Basos: 2 %
EOS (ABSOLUTE): 0.1 10*3/uL (ref 0.0–0.4)
Eos: 1 %
Hematocrit: 33.4 % — ABNORMAL LOW (ref 34.0–46.6)
Hemoglobin: 10.8 g/dL — ABNORMAL LOW (ref 11.1–15.9)
Immature Grans (Abs): 0 10*3/uL (ref 0.0–0.1)
Immature Granulocytes: 0 %
Lymphocytes Absolute: 3.3 10*3/uL — ABNORMAL HIGH (ref 0.7–3.1)
Lymphs: 40 %
MCH: 29.9 pg (ref 26.6–33.0)
MCHC: 32.3 g/dL (ref 31.5–35.7)
MCV: 93 fL (ref 79–97)
Monocytes Absolute: 0.6 10*3/uL (ref 0.1–0.9)
Monocytes: 7 %
Neutrophils Absolute: 4.1 10*3/uL (ref 1.4–7.0)
Neutrophils: 50 %
Platelets: 328 10*3/uL (ref 150–450)
RBC: 3.61 x10E6/uL — ABNORMAL LOW (ref 3.77–5.28)
RDW: 13.5 % (ref 11.7–15.4)
WBC: 8.2 10*3/uL (ref 3.4–10.8)

## 2021-09-06 ENCOUNTER — Other Ambulatory Visit (HOSPITAL_COMMUNITY): Payer: Self-pay

## 2021-09-12 ENCOUNTER — Other Ambulatory Visit (HOSPITAL_COMMUNITY): Payer: Self-pay

## 2021-10-02 ENCOUNTER — Other Ambulatory Visit (HOSPITAL_COMMUNITY): Payer: Self-pay

## 2021-10-05 ENCOUNTER — Other Ambulatory Visit: Payer: Self-pay

## 2021-10-09 ENCOUNTER — Other Ambulatory Visit (HOSPITAL_COMMUNITY): Payer: Self-pay

## 2021-10-11 ENCOUNTER — Other Ambulatory Visit (HOSPITAL_COMMUNITY): Payer: Self-pay

## 2021-10-25 ENCOUNTER — Telehealth: Payer: Self-pay | Admitting: Internal Medicine

## 2021-10-25 NOTE — Telephone Encounter (Signed)
.  Called patient to schedule appointment per 7/19 inbasket, patient is aware of date and time.

## 2021-11-02 ENCOUNTER — Other Ambulatory Visit (HOSPITAL_COMMUNITY): Payer: Self-pay

## 2021-11-02 ENCOUNTER — Ambulatory Visit: Payer: Medicaid Other | Admitting: Internal Medicine

## 2021-11-02 ENCOUNTER — Other Ambulatory Visit: Payer: Medicaid Other

## 2021-11-08 ENCOUNTER — Other Ambulatory Visit (HOSPITAL_COMMUNITY): Payer: Self-pay

## 2021-11-14 ENCOUNTER — Other Ambulatory Visit: Payer: Self-pay

## 2021-11-21 ENCOUNTER — Inpatient Hospital Stay (HOSPITAL_BASED_OUTPATIENT_CLINIC_OR_DEPARTMENT_OTHER): Payer: Medicaid Other | Admitting: Internal Medicine

## 2021-11-21 ENCOUNTER — Inpatient Hospital Stay: Payer: Medicaid Other | Attending: Internal Medicine

## 2021-11-21 ENCOUNTER — Encounter: Payer: Self-pay | Admitting: Internal Medicine

## 2021-11-21 ENCOUNTER — Other Ambulatory Visit: Payer: Self-pay

## 2021-11-21 VITALS — BP 140/75 | HR 78 | Temp 98.4°F | Resp 16 | Ht 70.0 in | Wt 279.6 lb

## 2021-11-21 DIAGNOSIS — C773 Secondary and unspecified malignant neoplasm of axilla and upper limb lymph nodes: Secondary | ICD-10-CM | POA: Insufficient documentation

## 2021-11-21 DIAGNOSIS — Z79899 Other long term (current) drug therapy: Secondary | ICD-10-CM | POA: Diagnosis not present

## 2021-11-21 DIAGNOSIS — C3491 Malignant neoplasm of unspecified part of right bronchus or lung: Secondary | ICD-10-CM

## 2021-11-21 DIAGNOSIS — C7931 Secondary malignant neoplasm of brain: Secondary | ICD-10-CM | POA: Diagnosis not present

## 2021-11-21 DIAGNOSIS — C3490 Malignant neoplasm of unspecified part of unspecified bronchus or lung: Secondary | ICD-10-CM | POA: Insufficient documentation

## 2021-11-21 LAB — CBC WITH DIFFERENTIAL (CANCER CENTER ONLY)
Abs Immature Granulocytes: 0.02 10*3/uL (ref 0.00–0.07)
Basophils Absolute: 0.1 10*3/uL (ref 0.0–0.1)
Basophils Relative: 1 %
Eosinophils Absolute: 0.1 10*3/uL (ref 0.0–0.5)
Eosinophils Relative: 1 %
HCT: 30.1 % — ABNORMAL LOW (ref 36.0–46.0)
Hemoglobin: 10.3 g/dL — ABNORMAL LOW (ref 12.0–15.0)
Immature Granulocytes: 0 %
Lymphocytes Relative: 34 %
Lymphs Abs: 3 10*3/uL (ref 0.7–4.0)
MCH: 31.1 pg (ref 26.0–34.0)
MCHC: 34.2 g/dL (ref 30.0–36.0)
MCV: 90.9 fL (ref 80.0–100.0)
Monocytes Absolute: 0.7 10*3/uL (ref 0.1–1.0)
Monocytes Relative: 7 %
Neutro Abs: 5 10*3/uL (ref 1.7–7.7)
Neutrophils Relative %: 57 %
Platelet Count: 311 10*3/uL (ref 150–400)
RBC: 3.31 MIL/uL — ABNORMAL LOW (ref 3.87–5.11)
RDW: 14.5 % (ref 11.5–15.5)
WBC Count: 8.9 10*3/uL (ref 4.0–10.5)
nRBC: 0 % (ref 0.0–0.2)

## 2021-11-21 LAB — CMP (CANCER CENTER ONLY)
ALT: 29 U/L (ref 0–44)
AST: 26 U/L (ref 15–41)
Albumin: 3.7 g/dL (ref 3.5–5.0)
Alkaline Phosphatase: 149 U/L — ABNORMAL HIGH (ref 38–126)
Anion gap: 6 (ref 5–15)
BUN: 9 mg/dL (ref 6–20)
CO2: 24 mmol/L (ref 22–32)
Calcium: 8.7 mg/dL — ABNORMAL LOW (ref 8.9–10.3)
Chloride: 110 mmol/L (ref 98–111)
Creatinine: 0.82 mg/dL (ref 0.44–1.00)
GFR, Estimated: >60 mL/min (ref >60)
Glucose, Bld: 90 mg/dL (ref 70–99)
Potassium: 3.8 mmol/L (ref 3.5–5.1)
Sodium: 140 mmol/L (ref 135–145)
Total Bilirubin: 1.7 mg/dL — ABNORMAL HIGH (ref 0.3–1.2)
Total Protein: 7 g/dL (ref 6.5–8.1)

## 2021-11-21 NOTE — Progress Notes (Signed)
[facility] Telephone:([phone]) [phone]   Fax:(336) [phone]  OFFICE PROGRESS NOTE  Ronnell Freshwater, NP [place] [address]  DIAGNOSIS: Stage IV (T3, N2, M1c) non-small cell lung cancer, adenocarcinoma with ALK gene translocation presented with multiple bilateral pulmonary nodules and masses as well as small mediastinal and bilateral axillary lymphadenopathy and metastatic disease to the brain diagnosed in March 2021.  PRIOR THERAPY: None  CURRENT THERAPY: Alecensa (Alectinib) 600 mg p.o. twice daily.  First dose started on July 23 2019.  Status post 28 months of treatment.  INTERVAL HISTORY: Sarah Chandler 36 y.o. female returns to the clinic today for follow-up visit accompanied by her cousin [place].  The patient was lost to follow-up for the last 6 months.  She has been dealing with family members with different health issues.  She was supposed to come back for follow-up visit in March 2023 but she missed her appointment.  She continues to take her medication with Alecensa (Alectinib) as prescribed.  She denied having any current chest pain but has shortness of breath with exertion with no cough or hemoptysis.  She has no nausea, vomiting but occasional diarrhea with no abdominal pain or constipation.  She has no headache or visual changes.  She is here today for evaluation and repeat blood work.  MEDICAL HISTORY: Past Medical History:  Diagnosis Date   nscl ca dx'd 06/2019   Pneumonia 05/08/2019    ALLERGIES:  is allergic to iron.  MEDICATIONS:  Current Outpatient Medications  Medication Sig Dispense Refill   acetaminophen (TYLENOL) 500 MG tablet Take 1,000 mg by mouth every 6 (six) hours as needed for moderate pain or headache.     albuterol (VENTOLIN HFA) 108 (90 Base) MCG/ACT inhaler Inhale 2 puffs into the lungs every 6 (six) hours as needed for wheezing or shortness of breath. 8 g 1   alectinib (ALECENSA) 150 MG capsule Take 4  capsules (600 mg total) by mouth 2 (two) times daily with a meal. 240 capsule 5   alectinib (ALECENSA) 150 MG capsule TAKE 4 CAPSULES (600 MG TOTAL) BY MOUTH 2 (TWO) TIMES DAILY WITH A MEAL. 240 capsule 5   ergocalciferol (VITAMIN D2) 1.25 MG (50000 UT) capsule Take 1 capsule (50,000 Units total) by mouth 2 (two) times a week. 26 capsule 3   hydrocortisone cream 1 % Apply 1 application topically 2 (two) times daily. 30 g 0   hydrOXYzine (ATARAX/VISTARIL) 25 MG tablet Take 1 tablet (25 mg total) by mouth 3 (three) times daily as needed for anxiety. **NEEDS APT FOR FURTHER REFILL** 30 tablet 0   levothyroxine (SYNTHROID) 88 MCG tablet Take 1 tablet (88 mcg total) by mouth daily. 90 tablet 3   lidocaine (XYLOCAINE) 2 % solution Apply small amount to effected area 3 to 4 times per day as needed for pain 100 mL 0   sulfamethoxazole-trimethoprim (BACTRIM DS) 800-160 MG tablet Take 1 tablet by mouth 2 (two) times daily. 20 tablet 0   No current facility-administered medications for this visit.    SURGICAL HISTORY:  Past Surgical History:  Procedure Laterality Date   BIOPSY  07/03/2019   Procedure: BIOPSY;  Surgeon: Rigoberto Noel, MD;  Location: [facility] ENDOSCOPY;  Service: Cardiopulmonary;;   BRONCHIAL BRUSHINGS  07/03/2019   Procedure: BRONCHIAL BRUSHINGS;  Surgeon: Rigoberto Noel, MD;  Location: [facility];  Service: Cardiopulmonary;;   BRONCHIAL WASHINGS  07/03/2019   Procedure: BRONCHIAL WASHINGS;  Surgeon:  Rigoberto Noel, MD;  Location: [facility] ENDOSCOPY;  Service: Cardiopulmonary;;   NO PAST SURGERIES     TOOTH EXTRACTION     VIDEO BRONCHOSCOPY N/A 07/03/2019   Procedure: VIDEO BRONCHOSCOPY WITH FLUORO;  Surgeon: Rigoberto Noel, MD;  Location: [facility];  Service: Cardiopulmonary;  Laterality: N/A;  LMA    REVIEW OF SYSTEMS:  Constitutional: positive for fatigue Eyes: negative Ears, nose, mouth, throat, and face: negative Respiratory: negative Cardiovascular: negative Gastrointestinal:  positive for diarrhea Genitourinary:negative Integument/breast: negative Hematologic/lymphatic: negative Musculoskeletal:negative Neurological: negative Behavioral/Psych: negative Endocrine: negative Allergic/Immunologic: negative   PHYSICAL EXAMINATION: General appearance: alert, cooperative, fatigued, and no distress Head: Normocephalic, without obvious abnormality, atraumatic Neck: no adenopathy, no JVD, supple, symmetrical, trachea midline, and thyroid not enlarged, symmetric, no tenderness/mass/nodules Lymph nodes: Cervical, supraclavicular, and axillary nodes normal. Resp: clear to auscultation bilaterally Back: symmetric, no curvature. ROM normal. No CVA tenderness. Cardio: regular rate and rhythm, S1, S2 normal, no murmur, click, rub or gallop GI: soft, non-tender; bowel sounds normal; no masses,  no organomegaly Extremities: extremities normal, atraumatic, no cyanosis or edema Neurologic: Alert and oriented X 3, normal strength and tone. Normal symmetric reflexes. Normal coordination and gait  ECOG PERFORMANCE STATUS: 1 - Symptomatic but completely ambulatory  Blood pressure (!) 140/75, pulse 78, temperature 98.4 F (36.9 C), temperature source Oral, resp. rate 16, height $RemoveBe'5\' 10"'bwSGIGvVX$  (1.778 m), weight 279 lb 9.6 oz (126.8 kg), SpO2 97 %.  LABORATORY DATA: Lab Results  Component Value Date   WBC 8.9 11/21/2021   HGB 10.3 (L) 11/21/2021   HCT 30.1 (L) 11/21/2021   MCV 90.9 11/21/2021   PLT 311 11/21/2021      Chemistry      Component Value Date/Time   NA 143 08/28/2021 0811   K 4.2 08/28/2021 0811   CL 106 08/28/2021 0811   CO2 23 08/28/2021 0811   BUN 8 08/28/2021 0811   CREATININE 0.86 08/28/2021 0811   CREATININE 0.73 05/29/2021 0848      Component Value Date/Time   CALCIUM 9.5 08/28/2021 0811   ALKPHOS 191 (H) 08/28/2021 0811   AST 22 08/28/2021 0811   AST 22 05/29/2021 0848   ALT 27 08/28/2021 0811   ALT 25 05/29/2021 0848   BILITOT 1.0 08/28/2021 0811    BILITOT 0.9 05/29/2021 0848       RADIOGRAPHIC STUDIES: No results found.   ASSESSMENT AND PLAN: This is a very pleasant 36 years old white female recently diagnosed with stage IV non-small cell lung cancer, adenocarcinoma with positive ALK gene translocation in March 2021.  The patient started treatment with Alecensa on July 23, 2019 and within few days she started not significant improvement in her condition with less shortness of breath as well as decrease in supraclavicular lymphadenopathy.  She is currently on treatment with Alecensa 600 mg p.o. twice daily status post 28 months of treatment. The patient was lost to follow-up for the last 6 months but she continues to do fine and she is taking her medication with Alecensa (Alectinib) as prescribed. I recommended for her to have repeat CT scan of the chest, abdomen and pelvis for restaging of her disease. I will see her back for follow-up visit in 2 weeks for evaluation and discussion of her scan results. The patient was advised to call immediately if she has any other concerning symptoms in the interval. The patient voices understanding of current disease status and treatment options and is in agreement with the current care plan.  All questions were answered. The patient knows to call the clinic with any problems, questions or concerns. We can certainly see the patient much sooner if necessary.  Disclaimer: This note was dictated with voice recognition software. Similar sounding words can inadvertently be transcribed and may not be corrected upon review.

## 2021-11-28 IMAGING — CT CT ANGIO CHEST
2 of 6 series · 18 of 36 positions shown · IV contrast (OMNIPAQUE 350)
Comparison: Radiograph same day, CT chest July 01, 2019

CLINICAL DATA: Shortness of breath, recent diagnosis of lung
cancer, COVID negative

EXAM:
CT ANGIOGRAPHY CHEST WITH CONTRAST
TECHNIQUE: Multidetector CT imaging of the chest was performed using the
standard protocol during bolus administration of intravenous
contrast. Multiplanar CT image reconstructions and MIPs were
obtained to evaluate the vascular anatomy.
CONTRAST:  100mL OMNIPAQUE IOHEXOL 350 MG/ML SOLN

[Series 5: thins · axial · 0.67mm/px · z∈[+1320,+1578]mm · 17 of 291 slices shown]
[im 17/291  lung]
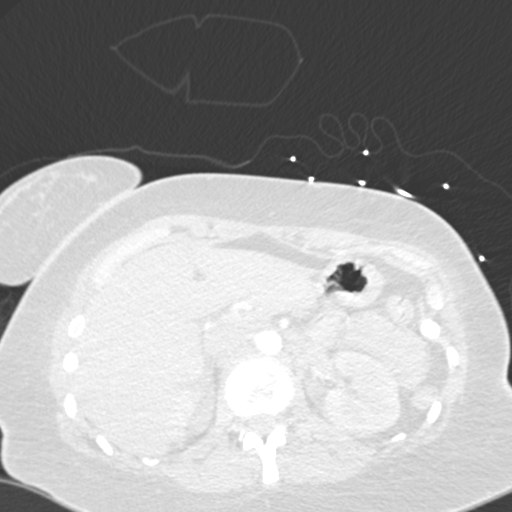
[im 33/291  mediastinal]
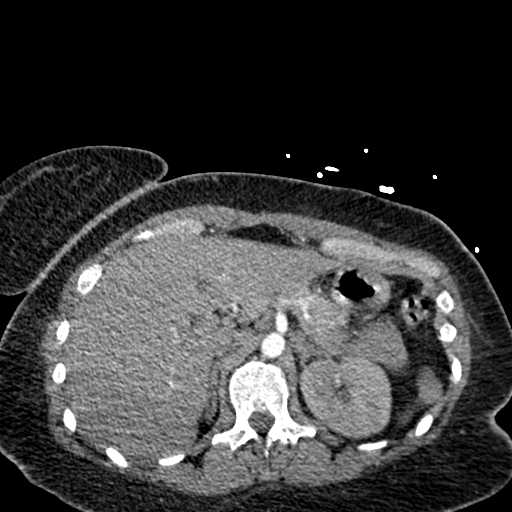
[im 49/291  lung]
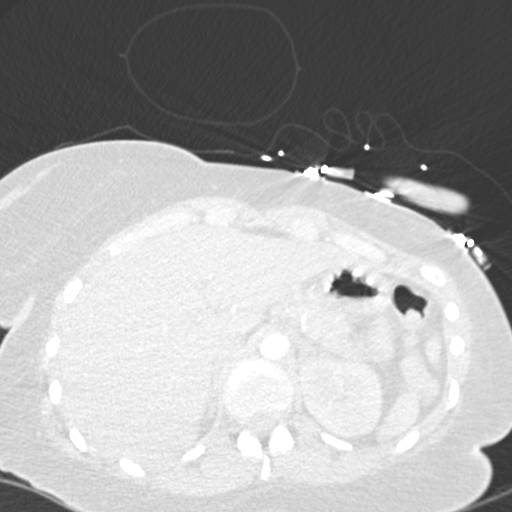
[im 65/291  mediastinal]
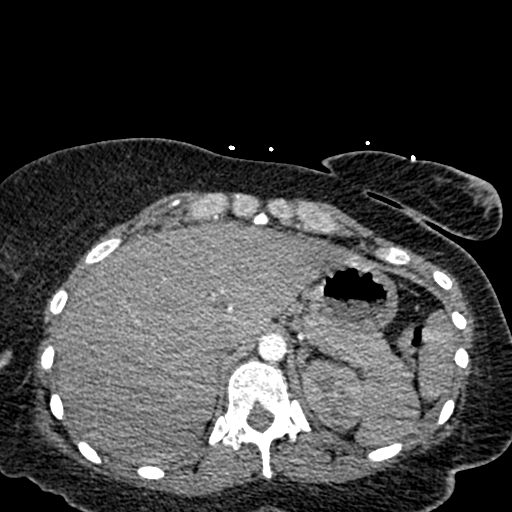
[im 81/291  lung]
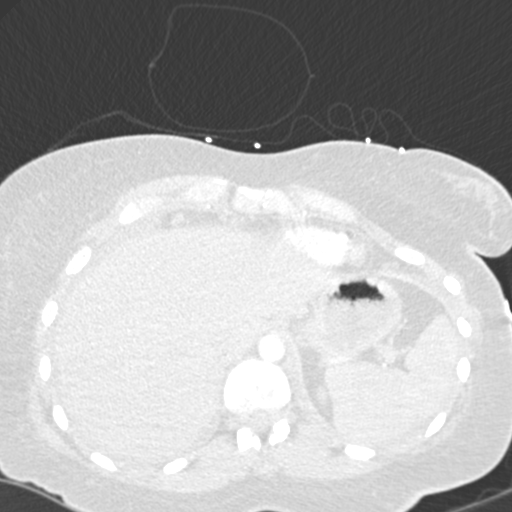
[im 97/291  mediastinal]
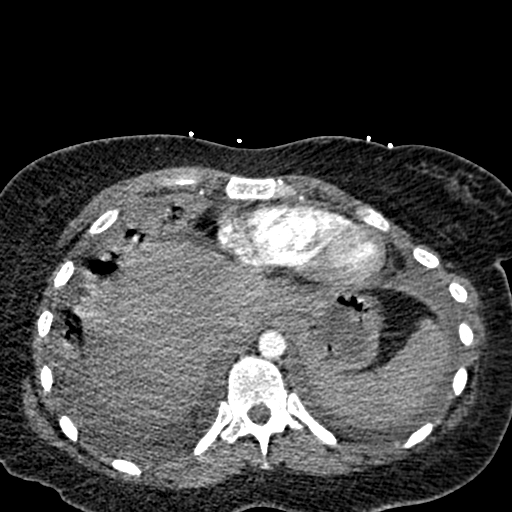
[im 113/291  lung]
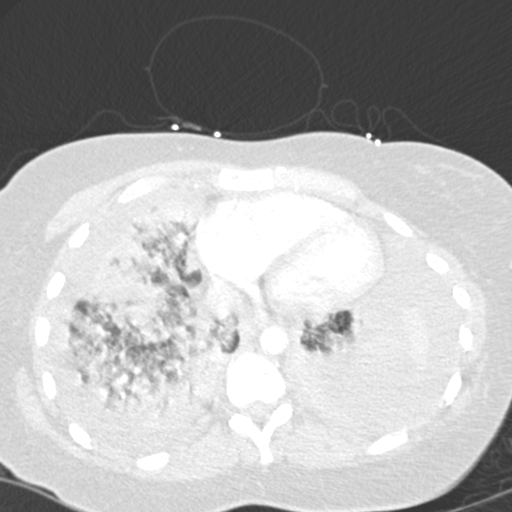
[im 129/291  mediastinal]
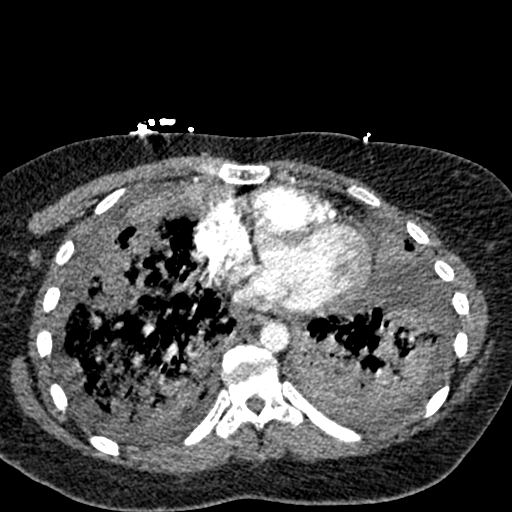
[im 146/291  lung]
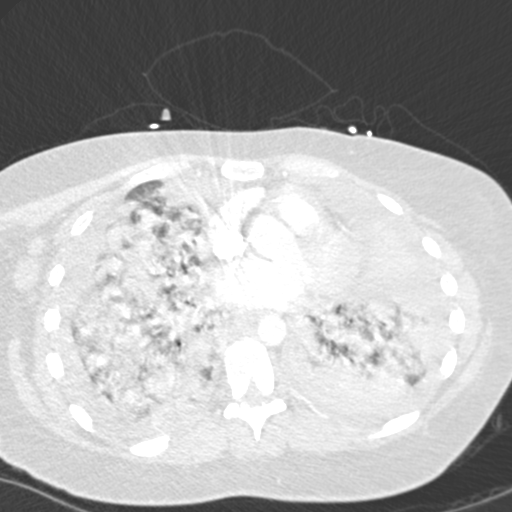
[im 162/291  mediastinal]
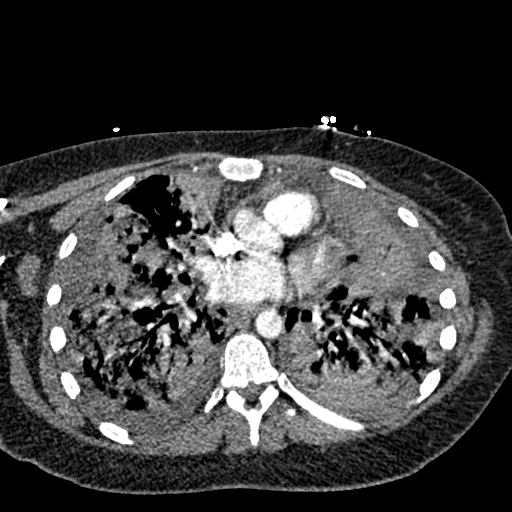
[im 178/291  lung]
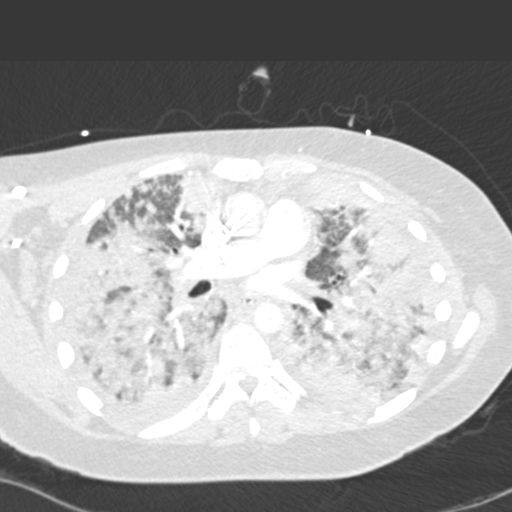
[im 194/291  mediastinal]
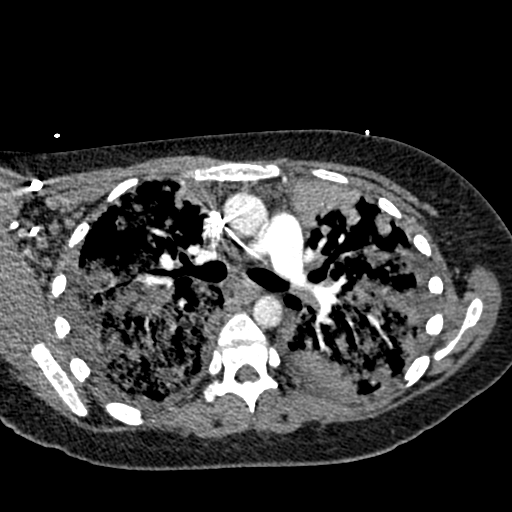
[im 210/291  lung]
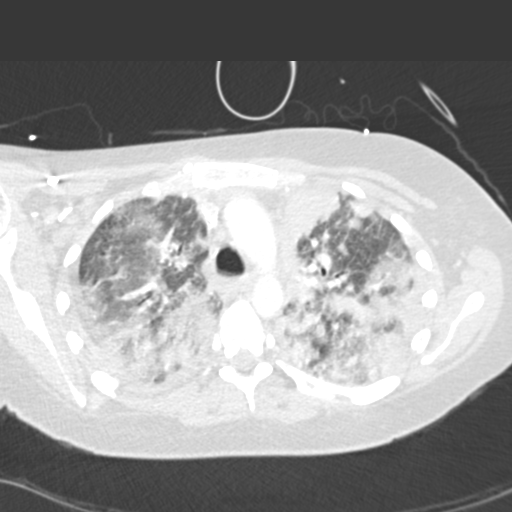
[im 226/291  mediastinal]
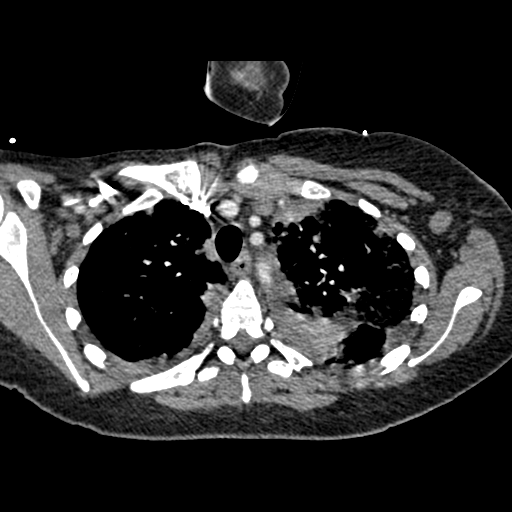
[im 242/291  lung]
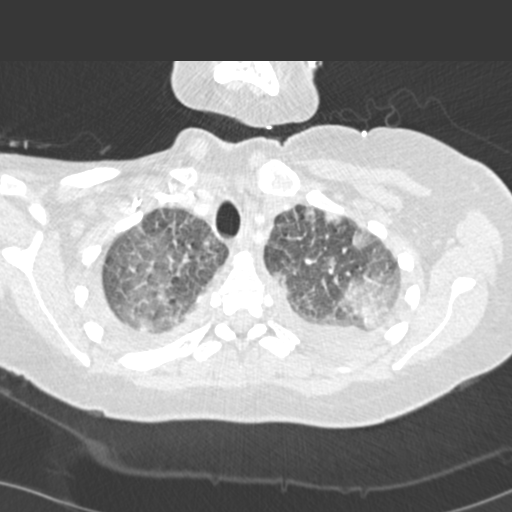
[im 258/291  mediastinal]
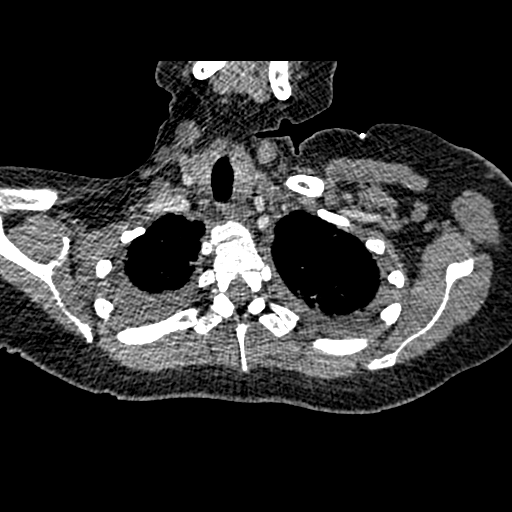
[im 274/291  lung]
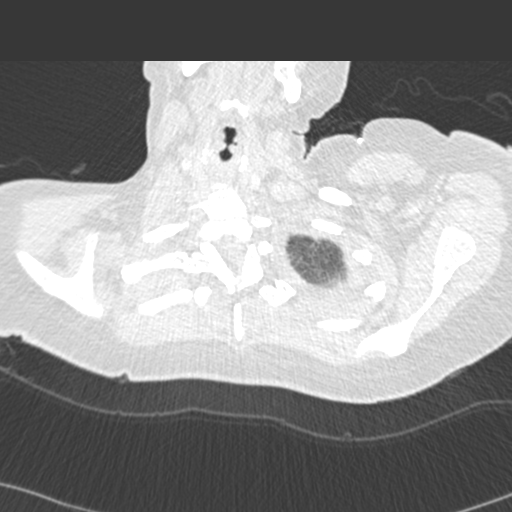

[Series 7: coronal mpr · coronal · 0.60mm/px · 1 of 115 slices shown]
[im 58/115  mediastinal]
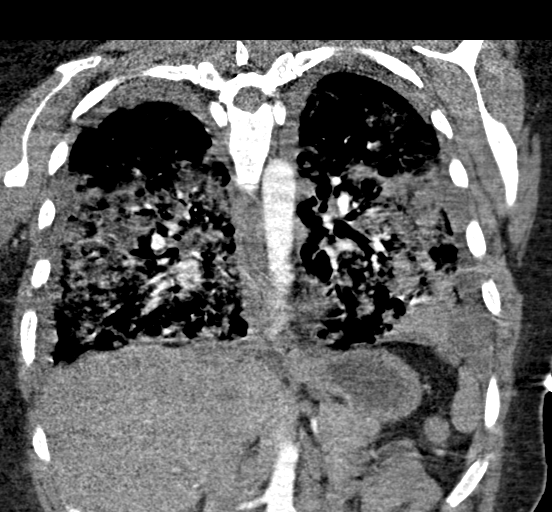

[18 of 36 positions shown; findings below may reference images not displayed]

FINDINGS: Cardiovascular: There is a optimal opacification of the pulmonary
arteries. There is no central,segmental, or subsegmental filling
defects within the pulmonary arteries. The heart is normal in size.
A trace pericardial effusion/thickening is seen. No evidence right
heart strain. There is normal three-vessel brachiocephalic anatomy
without proximal stenosis. The thoracic aorta is normal in
appearance.

Mediastinum/Nodes: Slight interval progression in the scattered
mediastinal and bilateral axillary adenopathy. For example within
the left axilla there is a 1.2 cm lymph node, series 4, image 32 and
previously 1 cm. There is also 1.0 cm subcarinal lymph node which
was previously 8 mm.

Lungs/Pleura: There is interval progression in the multifocal
extensive airspace patchy opacities throughout both lungs. There are
areas of dense consolidation seen within the left lung base. Small
bilateral pleural effusions are present, right greater than left.

Upper Abdomen: Again noted are scattered left-sided para aortic
lymph nodes.

Musculoskeletal: Again noted are numerous tiny sclerotic foci within
the visualized axial and appendicular skeleton. No destructive
osseous lesion or fracture is identified.

Review of the MIP images confirms the above findings.
IMPRESSION: 1. No central, segmental, or subsegmental pulmonary embolism.
2. Interval progression in extensive multifocal airspace opacities
and consolidation, likely multifocal pneumonia however cannot
exclude interval progression in the patient's known primary lung
neoplasm with metastatic disease.
3. Small bilateral pleural effusions, left greater than right
4. Mediastinal and bilateral axillary adenopathy, with slight
interval progression

## 2021-11-28 IMAGING — DX DG CHEST 1V PORT
1 series · 1 of 1 positions shown · non-contrast
Comparison: Chest CT July 03, 2019 and CT angiogram chest July 01, 2019

CLINICAL DATA: Shortness of breath.  Lung carcinoma

EXAM:
PORTABLE CHEST 1 VIEW

[chest ap]
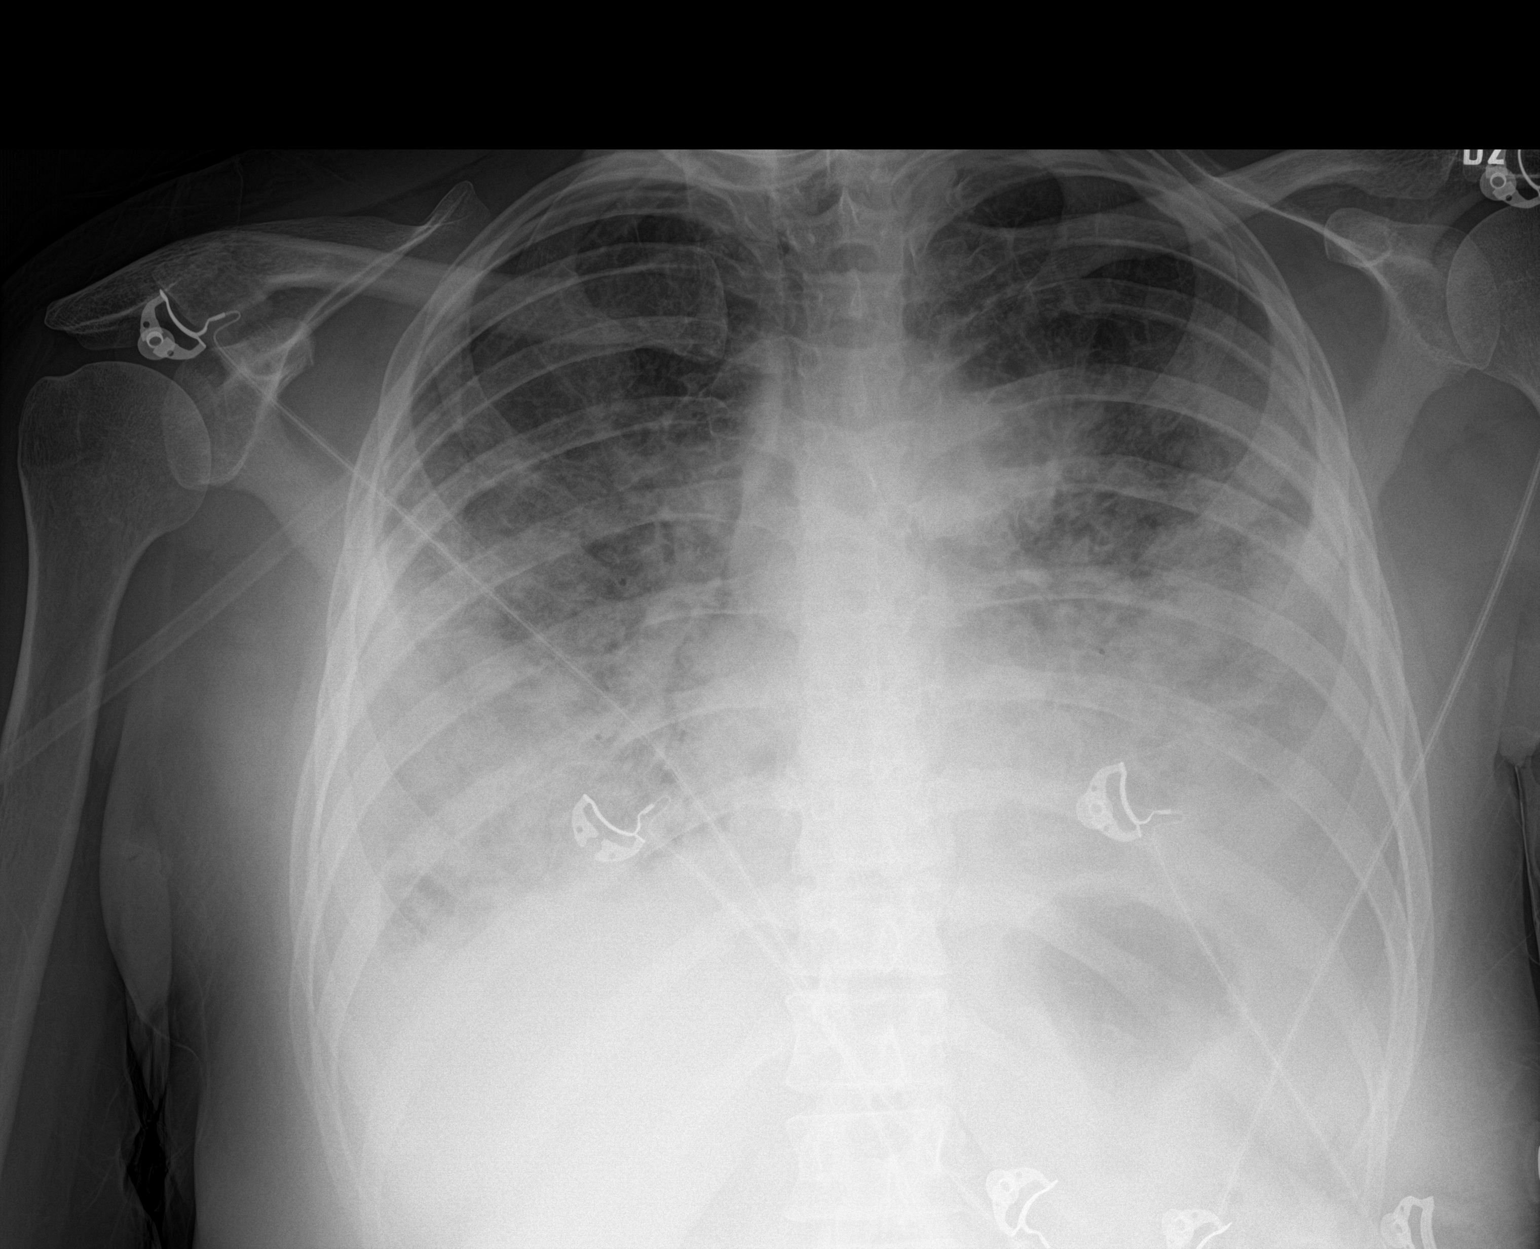

[1 of 1 positions shown; findings below may reference images not displayed]

FINDINGS: There is widespread airspace opacity throughout the lungs
bilaterally with slight progression in consolidation in the lower
lung regions compared to most recent study. There is airspace
opacity adjacent to the left superior hilum, stable. There are
fairly small pleural effusions as well. The heart size is normal.
Pulmonary vascularity is grossly normal. No adenopathy evident by
radiography. No bone lesions appreciable by radiography.
IMPRESSION: Widespread airspace opacity consistent with multifocal pneumonia.
Pleural effusions present. It should be noted that an underlying
parenchymal neoplastic involvement in the lungs cannot be excluded,
particularly given findings on recent CT. Stable cardiac silhouette.
No adenopathy demonstrable by radiography; see prior chest CT
report, however.

## 2021-11-29 ENCOUNTER — Ambulatory Visit (HOSPITAL_COMMUNITY)
Admission: RE | Admit: 2021-11-29 | Discharge: 2021-11-29 | Disposition: A | Discharge status: Home / Self Care | Payer: Medicaid Other | Source: Ambulatory Visit | Attending: Internal Medicine | Admitting: Internal Medicine

## 2021-11-29 ENCOUNTER — Other Ambulatory Visit (HOSPITAL_COMMUNITY): Payer: Self-pay

## 2021-11-29 DIAGNOSIS — C3490 Malignant neoplasm of unspecified part of unspecified bronchus or lung: Secondary | ICD-10-CM | POA: Insufficient documentation

## 2021-11-29 DIAGNOSIS — C7951 Secondary malignant neoplasm of bone: Secondary | ICD-10-CM | POA: Diagnosis not present

## 2021-11-29 DIAGNOSIS — R918 Other nonspecific abnormal finding of lung field: Secondary | ICD-10-CM | POA: Diagnosis not present

## 2021-11-29 DIAGNOSIS — R911 Solitary pulmonary nodule: Secondary | ICD-10-CM | POA: Diagnosis not present

## 2021-11-29 MED ORDER — IOHEXOL 300 MG/ML  SOLN
100.0000 mL | Freq: Once | INTRAMUSCULAR | Status: AC | PRN
  Administered 2021-11-29: 100 mL via INTRAVENOUS

## 2021-12-04 ENCOUNTER — Telehealth: Payer: Self-pay | Admitting: Internal Medicine

## 2021-12-04 NOTE — Telephone Encounter (Signed)
Called patient regarding 08/29 appointment, patient is notified.

## 2021-12-05 ENCOUNTER — Inpatient Hospital Stay: Payer: Medicaid Other

## 2021-12-05 ENCOUNTER — Inpatient Hospital Stay (HOSPITAL_BASED_OUTPATIENT_CLINIC_OR_DEPARTMENT_OTHER): Payer: Medicaid Other | Admitting: Internal Medicine

## 2021-12-05 ENCOUNTER — Telehealth: Payer: Self-pay | Admitting: Pharmacist

## 2021-12-05 ENCOUNTER — Other Ambulatory Visit: Payer: Self-pay

## 2021-12-05 ENCOUNTER — Other Ambulatory Visit (HOSPITAL_COMMUNITY): Payer: Self-pay

## 2021-12-05 VITALS — BP 136/67 | HR 88 | Temp 98.3°F | Wt 276.0 lb

## 2021-12-05 DIAGNOSIS — C3491 Malignant neoplasm of unspecified part of right bronchus or lung: Secondary | ICD-10-CM | POA: Diagnosis not present

## 2021-12-05 DIAGNOSIS — E039 Hypothyroidism, unspecified: Secondary | ICD-10-CM

## 2021-12-05 DIAGNOSIS — C3490 Malignant neoplasm of unspecified part of unspecified bronchus or lung: Secondary | ICD-10-CM | POA: Diagnosis not present

## 2021-12-05 DIAGNOSIS — Z5111 Encounter for antineoplastic chemotherapy: Secondary | ICD-10-CM | POA: Diagnosis not present

## 2021-12-05 LAB — CMP (CANCER CENTER ONLY)
ALT: 21 U/L (ref 0–44)
AST: 21 U/L (ref 15–41)
Albumin: 4.2 g/dL (ref 3.5–5.0)
Alkaline Phosphatase: 167 U/L — ABNORMAL HIGH (ref 38–126)
Anion gap: 7 (ref 5–15)
BUN: 10 mg/dL (ref 6–20)
CO2: 24 mmol/L (ref 22–32)
Calcium: 8.8 mg/dL — ABNORMAL LOW (ref 8.9–10.3)
Chloride: 109 mmol/L (ref 98–111)
Creatinine: 0.56 mg/dL (ref 0.44–1.00)
GFR, Estimated: >60 mL/min (ref >60)
Glucose, Bld: 102 mg/dL — ABNORMAL HIGH (ref 70–99)
Potassium: 3.7 mmol/L (ref 3.5–5.1)
Sodium: 140 mmol/L (ref 135–145)
Total Bilirubin: 1.2 mg/dL (ref 0.3–1.2)
Total Protein: 7.1 g/dL (ref 6.5–8.1)

## 2021-12-05 LAB — CBC WITH DIFFERENTIAL (CANCER CENTER ONLY)
Abs Immature Granulocytes: 0.02 10*3/uL (ref 0.00–0.07)
Basophils Absolute: 0.1 10*3/uL (ref 0.0–0.1)
Basophils Relative: 1 %
Eosinophils Absolute: 0.1 10*3/uL (ref 0.0–0.5)
Eosinophils Relative: 1 %
HCT: 31.2 % — ABNORMAL LOW (ref 36.0–46.0)
Hemoglobin: 10.8 g/dL — ABNORMAL LOW (ref 12.0–15.0)
Immature Granulocytes: 0 %
Lymphocytes Relative: 23 %
Lymphs Abs: 2.2 10*3/uL (ref 0.7–4.0)
MCH: 30.9 pg (ref 26.0–34.0)
MCHC: 34.6 g/dL (ref 30.0–36.0)
MCV: 89.4 fL (ref 80.0–100.0)
Monocytes Absolute: 0.8 10*3/uL (ref 0.1–1.0)
Monocytes Relative: 8 %
Neutro Abs: 6.5 10*3/uL (ref 1.7–7.7)
Neutrophils Relative %: 67 %
Platelet Count: 310 10*3/uL (ref 150–400)
RBC: 3.49 MIL/uL — ABNORMAL LOW (ref 3.87–5.11)
RDW: 14.3 % (ref 11.5–15.5)
WBC Count: 9.7 10*3/uL (ref 4.0–10.5)
nRBC: 0 % (ref 0.0–0.2)

## 2021-12-05 LAB — LIPID PANEL
Cholesterol: 147 mg/dL (ref 0–200)
HDL: 48 mg/dL (ref >40)
LDL Cholesterol: 85 mg/dL (ref 0–99)
Total CHOL/HDL Ratio: 3.1 RATIO
Triglycerides: 70 mg/dL (ref <150)
VLDL: 14 mg/dL (ref 0–40)

## 2021-12-05 NOTE — Telephone Encounter (Addendum)
Oral Oncology Pharmacist Encounter  Received new prescription for Lorbrena (lorlatinib) for the treatment of metastatic non-small cell lung cancer, ALK positive, planned duration until disease progression or unacceptable drug toxicity.  CBC w Diff and CMP from 12/05/21 assessed, no relevant lab abnormalities requiring baseline dose adjustment required at this time. Patient will have baseline EKG and baseline lipid panel done in office today to be monitored throughout therapy while on [place].   Current medication list in Epic reviewed, no relevant/significant DDIs with [place] identified.  Evaluated chart and no patient barriers to medication adherence noted.   Patient agreement for treatment documented in MD note on 12/05/21.  Prescription has been e-scribed to the [facility] for benefits analysis and approval.  Oral Oncology Clinic will continue to follow for insurance authorization, copayment issues, initial counseling and start date.  Leron Croak, PharmD, BCPS, [facility] Hematology/Oncology Clinical Pharmacist [facility] and [place] [phone] 12/05/2021 9:54 AM

## 2021-12-05 NOTE — Progress Notes (Signed)
START ON PATHWAY REGIMEN - Non-Small Cell Lung     A cycle is every 28 days:     Lorlatinib   **Always confirm dose/schedule in your pharmacy ordering system**  Patient Characteristics: Stage IV Metastatic, Nonsquamous, Molecular Analysis Completed, Molecular Alteration Present and Eligible for Molecular Targeted Therapy, Second Line - Molecular Targeted Therapy, ALK Fusion/Rearrangement Positive Therapeutic Status: Stage IV Metastatic Histology: Nonsquamous Cell Broad Molecular Profiling Status: Engineer, manufacturing Analysis Results: Alteration Present and Eligible for Molecular Targeted Therapy Molecular Alteration Present: ALK Fusion/Rearrangement Positive Molecular Targeted Line of Therapy: Second Paediatric nurse Therapy Intent of Therapy: Non-Curative / Palliative Intent, Discussed with Patient

## 2021-12-05 NOTE — Telephone Encounter (Signed)
Oral Chemotherapy Pharmacist Encounter  I met with patient in clinic for overview of new oral chemotherapy medication: Lorbrena (lorlatinib) for the treatment of metastatic non-small cell lung cancer, ALK positive, planned duration until disease progression or unacceptable drug toxicity.  Pt is doing well. Counseled patient on administration, dosing, side effects, monitoring, drug-food interactions, safe handling, storage, and disposal.  Patient will take Lorbrena 100 mg tablets, 1 tablet by mouth daily with or without food.  Start date: 12/07/21  Patient knows to avoid grapefruit and grapefruit juice while on [place].  Side effects include but not limited to: increased blood pressure, edema, changes in electrolytes, increase in cholesterol and triglycerides, changes in LFTs, peripheral neuropathy, CNS/mood changes, and rare but serious risk for AV block and ILD/pneumonitis.  Reviewed with patient importance of keeping a medication schedule and plan for any missed doses.  After discussion with patient no patient barriers to medication adherence identified.   Ms. Luse voiced understanding and appreciation. All questions answered. Medication handout provided.  Provided patient with [facility] phone number. Patient knows to call the office with questions or concerns. Oral Chemotherapy Navigation Clinic will continue to follow.  Leron Croak, PharmD, BCPS, [facility] Hematology/Oncology Clinical Pharmacist [facility] [place] [phone] 12/05/2021 10:09 AM

## 2021-12-05 NOTE — Progress Notes (Signed)
[place] [phone]   [phone]  OFFICE PROGRESS NOTE  Ronnell Freshwater, NP [place] [address]  DIAGNOSIS: Stage IV (T3, N2, M1c) non-small cell lung cancer, adenocarcinoma with ALK gene translocation presented with multiple bilateral pulmonary nodules and masses as well as small mediastinal and bilateral axillary lymphadenopathy and metastatic disease to the brain diagnosed in March 2021.  PRIOR THERAPY: Alecensa (Alectinib) 600 mg p.o. twice daily.  First dose started on July 23 2019.  Status post 29 months of treatment.  CURRENT THERAPY: Lorlatinib 100 mg p.o. daily.  Expected to start in the next few days.  INTERVAL HISTORY: Sarah Chandler 36 y.o. female returns to the clinic today for follow-up visit.  The patient was accompanied by her cousin Sarah Chandler.  The patient is complaining of increasing fatigue and weakness recently.  She also has shortness of breath with exertion but no significant chest pain, cough or hemoptysis.  She has no nausea, vomiting, diarrhea or constipation.  She has no headache or visual changes.  She has been tolerating her treatment with Alecensa (Alectinib) fairly well.  She is here today for evaluation with repeat CT scan of the chest, abdomen and pelvis for restaging of her disease.  MEDICAL HISTORY: Past Medical History:  Diagnosis Date   nscl ca dx'd 06/2019   Pneumonia 05/08/2019    ALLERGIES:  is allergic to iron.  MEDICATIONS:  Current Outpatient Medications  Medication Sig Dispense Refill   acetaminophen (TYLENOL) 500 MG tablet Take 1,000 mg by mouth every 6 (six) hours as needed for moderate pain or headache.     albuterol (VENTOLIN HFA) 108 (90 Base) MCG/ACT inhaler Inhale 2 puffs into the lungs every 6 (six) hours as needed for wheezing or shortness of breath. 8 g 1   alectinib (ALECENSA) 150 MG capsule Take 4 capsules (600 mg total) by mouth 2 (two) times daily with a  meal. 240 capsule 5   alectinib (ALECENSA) 150 MG capsule TAKE 4 CAPSULES (600 MG TOTAL) BY MOUTH 2 (TWO) TIMES DAILY WITH A MEAL. 240 capsule 5   ergocalciferol (VITAMIN D2) 1.25 MG (50000 UT) capsule Take 1 capsule (50,000 Units total) by mouth 2 (two) times a week. 26 capsule 3   hydrOXYzine (ATARAX/VISTARIL) 25 MG tablet Take 1 tablet (25 mg total) by mouth 3 (three) times daily as needed for anxiety. **NEEDS APT FOR FURTHER REFILL** 30 tablet 0   levothyroxine (SYNTHROID) 88 MCG tablet Take 1 tablet (88 mcg total) by mouth daily. 90 tablet 3   No current facility-administered medications for this visit.    SURGICAL HISTORY:  Past Surgical History:  Procedure Laterality Date   BIOPSY  07/03/2019   Procedure: BIOPSY;  Surgeon: Rigoberto Noel, MD;  Location: [facility] ENDOSCOPY;  Service: Cardiopulmonary;;   BRONCHIAL BRUSHINGS  07/03/2019   Procedure: BRONCHIAL BRUSHINGS;  Surgeon: Rigoberto Noel, MD;  Location: [facility];  Service: Cardiopulmonary;;   BRONCHIAL WASHINGS  07/03/2019   Procedure: BRONCHIAL WASHINGS;  Surgeon: Rigoberto Noel, MD;  Location: [place];  Service: Cardiopulmonary;;   NO PAST SURGERIES     TOOTH EXTRACTION     VIDEO BRONCHOSCOPY N/A 07/03/2019   Procedure: VIDEO BRONCHOSCOPY WITH FLUORO;  Surgeon: Rigoberto Noel, MD;  Location: [facility];  Service: Cardiopulmonary;  Laterality: N/A;  LMA    REVIEW OF SYSTEMS:  Constitutional: positive for fatigue Eyes: negative Ears, nose, mouth, throat, and face: negative  Respiratory: positive for dyspnea on exertion Cardiovascular: negative Gastrointestinal: negative Genitourinary:negative Integument/breast: negative Hematologic/lymphatic: negative Musculoskeletal:negative Neurological: negative Behavioral/Psych: negative Endocrine: negative Allergic/Immunologic: negative   PHYSICAL EXAMINATION: General appearance: alert, cooperative, fatigued, and no distress Head: Normocephalic, without obvious abnormality,  atraumatic Neck: no adenopathy, no JVD, supple, symmetrical, trachea midline, and thyroid not enlarged, symmetric, no tenderness/mass/nodules Lymph nodes: Cervical, supraclavicular, and axillary nodes normal. Resp: clear to auscultation bilaterally Back: symmetric, no curvature. ROM normal. No CVA tenderness. Cardio: regular rate and rhythm, S1, S2 normal, no murmur, click, rub or gallop GI: soft, non-tender; bowel sounds normal; no masses,  no organomegaly Extremities: extremities normal, atraumatic, no cyanosis or edema Neurologic: Alert and oriented X 3, normal strength and tone. Normal symmetric reflexes. Normal coordination and gait  ECOG PERFORMANCE STATUS: 1 - Symptomatic but completely ambulatory  Blood pressure 136/67, pulse 88, temperature 98.3 F (36.8 C), temperature source Tympanic, weight 276 lb (125.2 kg), last menstrual period 11/08/2021, SpO2 99 %.  LABORATORY DATA: Lab Results  Component Value Date   WBC 8.9 11/21/2021   HGB 10.3 (L) 11/21/2021   HCT 30.1 (L) 11/21/2021   MCV 90.9 11/21/2021   PLT 311 11/21/2021      Chemistry      Component Value Date/Time   NA 140 11/21/2021 0850   NA 143 08/28/2021 0811   K 3.8 11/21/2021 0850   CL 110 11/21/2021 0850   CO2 24 11/21/2021 0850   BUN 9 11/21/2021 0850   BUN 8 08/28/2021 0811   CREATININE 0.82 11/21/2021 0850      Component Value Date/Time   CALCIUM 8.7 (L) 11/21/2021 0850   ALKPHOS 149 (H) 11/21/2021 0850   AST 26 11/21/2021 0850   ALT 29 11/21/2021 0850   BILITOT 1.7 (H) 11/21/2021 0850       RADIOGRAPHIC STUDIES: CT Chest W Contrast  Result Date: 11/30/2021 CLINICAL DATA:  Follow-up non-small cell lung carcinoma. Ongoing chemotherapy. Evaluate treatment response. * Tracking Code: BO * EXAM: CT CHEST, ABDOMEN, AND PELVIS WITH CONTRAST TECHNIQUE: Multidetector CT imaging of the chest, abdomen and pelvis was performed following the standard protocol during bolus administration of intravenous  contrast. RADIATION DOSE REDUCTION: This exam was performed according to the departmental dose-optimization program which includes automated exposure control, adjustment of the mA and/or kV according to patient size and/or use of iterative reconstruction technique. CONTRAST:  188m OMNIPAQUE IOHEXOL 300 MG/ML  SOLN COMPARISON:  06/23/2021 FINDINGS: CT CHEST FINDINGS Cardiovascular: No acute findings. Mediastinum/Lymph Nodes: No masses or pathologically enlarged lymph nodes identified. Lungs/Pleura: Innumerable sub-cm pulmonary nodules are now seen throughout both lungs which are increased in size and number, consistent with progression of diffuse pulmonary metastases. Dominant nodule now seen in the anteromedial left upper lobe measures 1.5 x 1.3 cm on image 72/4, compared to 1.0 x 1.0 cm previously. No evidence of pulmonary consolidation or pleural effusion. Musculoskeletal: Numerous small less than 1 cm sclerotic bone lesions are seen throughout the spine sternum, scapulae, clavicles, which have increased and are consistent with diffuse sclerotic bone metastases. CT ABDOMEN AND PELVIS FINDINGS Hepatobiliary: No masses identified. Gallbladder is unremarkable. No evidence of biliary ductal dilatation. Pancreas:  No mass or inflammatory changes. Spleen:  Within normal limits in size and appearance. Adrenals/Urinary tract:  No masses or hydronephrosis. Stomach/Bowel: No evidence of obstruction, inflammatory process, or abnormal fluid collections. Vascular/Lymphatic: Sub-centimeter mesenteric and retroperitoneal lymph nodes are again noted. No pathologically enlarged lymph nodes identified. No acute vascular findings. Reproductive:  No mass or other  significant abnormality identified. Other:  None. Musculoskeletal: Increased size and number of numerous tiny sclerotic lesions are seen throughout the lumbar spine and pelvis, consistent with progression of diffuse sclerotic bone metastases. IMPRESSION: Interval  progression of diffuse bilateral pulmonary metastases. Interval progression of diffuse sclerotic bone metastases. No definite soft tissue metastatic disease within the abdomen or pelvis. Electronically Signed   By: Marlaine Hind M.D.   On: 11/30/2021 16:01   CT Abdomen Pelvis W Contrast  Result Date: 11/30/2021 CLINICAL DATA:  Follow-up non-small cell lung carcinoma. Ongoing chemotherapy. Evaluate treatment response. * Tracking Code: BO * EXAM: CT CHEST, ABDOMEN, AND PELVIS WITH CONTRAST TECHNIQUE: Multidetector CT imaging of the chest, abdomen and pelvis was performed following the standard protocol during bolus administration of intravenous contrast. RADIATION DOSE REDUCTION: This exam was performed according to the departmental dose-optimization program which includes automated exposure control, adjustment of the mA and/or kV according to patient size and/or use of iterative reconstruction technique. CONTRAST:  136m OMNIPAQUE IOHEXOL 300 MG/ML  SOLN COMPARISON:  06/23/2021 FINDINGS: CT CHEST FINDINGS Cardiovascular: No acute findings. Mediastinum/Lymph Nodes: No masses or pathologically enlarged lymph nodes identified. Lungs/Pleura: Innumerable sub-cm pulmonary nodules are now seen throughout both lungs which are increased in size and number, consistent with progression of diffuse pulmonary metastases. Dominant nodule now seen in the anteromedial left upper lobe measures 1.5 x 1.3 cm on image 72/4, compared to 1.0 x 1.0 cm previously. No evidence of pulmonary consolidation or pleural effusion. Musculoskeletal: Numerous small less than 1 cm sclerotic bone lesions are seen throughout the spine sternum, scapulae, clavicles, which have increased and are consistent with diffuse sclerotic bone metastases. CT ABDOMEN AND PELVIS FINDINGS Hepatobiliary: No masses identified. Gallbladder is unremarkable. No evidence of biliary ductal dilatation. Pancreas:  No mass or inflammatory changes. Spleen:  Within normal limits  in size and appearance. Adrenals/Urinary tract:  No masses or hydronephrosis. Stomach/Bowel: No evidence of obstruction, inflammatory process, or abnormal fluid collections. Vascular/Lymphatic: Sub-centimeter mesenteric and retroperitoneal lymph nodes are again noted. No pathologically enlarged lymph nodes identified. No acute vascular findings. Reproductive:  No mass or other significant abnormality identified. Other:  None. Musculoskeletal: Increased size and number of numerous tiny sclerotic lesions are seen throughout the lumbar spine and pelvis, consistent with progression of diffuse sclerotic bone metastases. IMPRESSION: Interval progression of diffuse bilateral pulmonary metastases. Interval progression of diffuse sclerotic bone metastases. No definite soft tissue metastatic disease within the abdomen or pelvis. Electronically Signed   By: JMarlaine HindM.D.   On: 11/30/2021 16:01     ASSESSMENT AND PLAN: This is a very pleasant 36years old white female recently diagnosed with stage IV non-small cell lung cancer, adenocarcinoma with positive ALK gene translocation in March 2021.  The patient started treatment with Alecensa on July 23, 2019 and within few days she started not significant improvement in her condition with less shortness of breath as well as decrease in supraclavicular lymphadenopathy.  She is currently on treatment with Alecensa 600 mg p.o. twice daily status post 29 months of treatment. The patient was lost to follow-up for the last 6 months but she continues to do fine and she is taking her medication with Alecensa (Alectinib) as prescribed. She has been tolerating her treatment with Alecensa (Alectinib) fairly well. She had repeat CT scan of the chest, abdomen and pelvis performed recently.  I personally and independently reviewed the scan images and discussed results with the patient and her cousin today. Her scan showed progression of the  diffuse bilateral pulmonary metastasis in  addition to progressive diffuse sclerotic bone metastasis. I discussed with the patient her treatment options and recommended for her to discontinue her current treatment with Alecensa (Alectinib) in the next few days once we have the next treatment option available. I discussed with her the second line treatment option was Lorlatinib 100 mg p.o. daily.  She will see the pharmacist for oral oncolytic today for education as well as to help the patient with the refill of her medication. She had an EKG today that was unremarkable. She will come back for follow-up visit in 2-3 weeks for evaluation and management of any adverse effect of her treatment. I will check her lipid panel today and also in around 3 weeks from now. The patient was advised to call immediately if she has any other concerning symptoms in the interval. The patient voices understanding of current disease status and treatment options and is in agreement with the current care plan.  All questions were answered. The patient knows to call the clinic with any problems, questions or concerns. We can certainly see the patient much sooner if necessary.  Disclaimer: This note was dictated with voice recognition software. Similar sounding words can inadvertently be transcribed and may not be corrected upon review.

## 2021-12-06 ENCOUNTER — Encounter: Payer: Self-pay | Admitting: Internal Medicine

## 2021-12-06 ENCOUNTER — Other Ambulatory Visit: Payer: Self-pay

## 2021-12-06 ENCOUNTER — Telehealth: Payer: Self-pay | Admitting: Pharmacy Technician

## 2021-12-06 ENCOUNTER — Other Ambulatory Visit (HOSPITAL_COMMUNITY): Payer: Self-pay

## 2021-12-06 MED ORDER — LORLATINIB 100 MG PO TABS
100.0000 mg | ORAL_TABLET | Freq: Every day | ORAL | 2 refills | Status: DC
  Filled 2021-12-06 (×2): qty 30, 30d supply, fill #0
  Filled 2021-12-28 – 2021-12-29 (×2): qty 30, 30d supply, fill #1
  Filled 2022-01-23: qty 30, 30d supply, fill #2

## 2021-12-06 NOTE — Telephone Encounter (Signed)
Oral Oncology Patient Advocate Encounter  After completing a benefits investigation, prior authorization for Sarah Chandler is not required at this time through [facility].  Patient's copay is $4.     Lady Deutscher, CPhT-Adv Oncology Pharmacy Patient [place] Direct Number: [phone]  Fax: [phone]

## 2021-12-13 ENCOUNTER — Other Ambulatory Visit (HOSPITAL_COMMUNITY): Payer: Self-pay

## 2021-12-19 ENCOUNTER — Other Ambulatory Visit (HOSPITAL_COMMUNITY): Payer: Self-pay

## 2021-12-22 NOTE — Progress Notes (Signed)
[facility] OFFICE PROGRESS NOTE  Ronnell Freshwater, NP [place] [address]  DIAGNOSIS: Stage IV (T3, N2, M1c) non-small cell lung cancer, adenocarcinoma with ALK gene translocation presented with multiple bilateral pulmonary nodules and masses as well as small mediastinal and bilateral axillary lymphadenopathy and metastatic disease to the brain diagnosed in March 2021.    PRIOR THERAPY: Alecensa (Alectinib) 600 mg p.o. twice daily.  First dose started on July 23 2019.  Status post 29 months of treatment.  CURRENT THERAPY: Lorlatinib 100 mg p.o. daily.  First dose on 12/06/21  INTERVAL HISTORY: Sarah Chandler 36 y.o. female returns to the clinic for a follow up visit. The patient is feeling well today without any concerning complaints. She recently was found to have evidence for disease progression. Therefore, her targeted treatment was changed to lorlatinib. She is tolerating this a lot better. She reports her mood and energy has improved. Denies any fever, chills, night sweats, or weight loss. Denies any chest pain, significant shortness of breath, significant cough, or hemoptysis. She may have shortness of breath in the humidity or around smoke. She only coughs associated with allergies. Denies any nausea, vomiting, diarrhea, or constipation. Denies any headache or visual changes. Denies any rashes or skin changes. The patient is here today for evaluation and repeat blood work.   MEDICAL HISTORY: Past Medical History:  Diagnosis Date   nscl ca dx'd 06/2019   Pneumonia 05/08/2019    ALLERGIES:  is allergic to iron.  MEDICATIONS:  Current Outpatient Medications  Medication Sig Dispense Refill   acetaminophen (TYLENOL) 500 MG tablet Take 1,000 mg by mouth every 6 (six) hours as needed for moderate pain or headache.     albuterol (VENTOLIN HFA) 108 (90 Base) MCG/ACT inhaler Inhale 2 puffs into the lungs every 6 (six) hours as needed for wheezing or  shortness of breath. 8 g 1   ergocalciferol (VITAMIN D2) 1.25 MG (50000 UT) capsule Take 1 capsule (50,000 Units total) by mouth 2 (two) times a week. 26 capsule 3   hydrOXYzine (ATARAX/VISTARIL) 25 MG tablet Take 1 tablet (25 mg total) by mouth 3 (three) times daily as needed for anxiety. **NEEDS APT FOR FURTHER REFILL** 30 tablet 0   levothyroxine (SYNTHROID) 88 MCG tablet Take 1 tablet (88 mcg total) by mouth daily. 90 tablet 3   lorlatinib (LORBRENA) 100 MG tablet Take 1 tablet (100 mg total) by mouth daily. Swallow tablets whole. Do not chew, crush or split tablets. 30 tablet 2   No current facility-administered medications for this visit.    SURGICAL HISTORY:  Past Surgical History:  Procedure Laterality Date   BIOPSY  07/03/2019   Procedure: BIOPSY;  Surgeon: Rigoberto Noel, MD;  Location: [facility] ENDOSCOPY;  Service: Cardiopulmonary;;   BRONCHIAL BRUSHINGS  07/03/2019   Procedure: BRONCHIAL BRUSHINGS;  Surgeon: Rigoberto Noel, MD;  Location: [facility];  Service: Cardiopulmonary;;   BRONCHIAL WASHINGS  07/03/2019   Procedure: BRONCHIAL WASHINGS;  Surgeon: Rigoberto Noel, MD;  Location: [facility];  Service: Cardiopulmonary;;   NO PAST SURGERIES     TOOTH EXTRACTION     VIDEO BRONCHOSCOPY N/A 07/03/2019   Procedure: VIDEO BRONCHOSCOPY WITH FLUORO;  Surgeon: Rigoberto Noel, MD;  Location: [facility];  Service: Cardiopulmonary;  Laterality: N/A;  LMA    REVIEW OF SYSTEMS:   Review of Systems  Constitutional: Improving fatigue. Negative for appetite change, chills, fever and unexpected weight change.  HENT: Negative  for mouth sores, nosebleeds, sore throat and trouble swallowing.   Eyes: Negative for eye problems and icterus.  Respiratory: Negative for cough, hemoptysis, shortness of breath and wheezing.   Cardiovascular: Negative for chest pain and leg swelling.  Gastrointestinal: Negative for abdominal pain, constipation, diarrhea, nausea and vomiting.  Genitourinary: Negative for  bladder incontinence, difficulty urinating, dysuria, frequency and hematuria.   Musculoskeletal: Negative for back pain, gait problem, neck pain and neck stiffness.  Skin: Negative for itching and rash.  Neurological: Negative for dizziness, extremity weakness, gait problem, headaches, light-headedness and seizures.  Hematological: Negative for adenopathy. Does not bruise/bleed easily.  Psychiatric/Behavioral: Negative for confusion, depression and sleep disturbance. The patient is not nervous/anxious.     PHYSICAL EXAMINATION:  Last menstrual period 11/08/2021.  ECOG PERFORMANCE STATUS: 1  Physical Exam  Constitutional: Oriented to person, place, and time and well-developed, well-nourished, and in no distress.  HENT:  Head: Normocephalic and atraumatic.  Mouth/Throat: Oropharynx is clear and moist. No oropharyngeal exudate.  Eyes: Conjunctivae are normal. Right eye exhibits no discharge. Left eye exhibits no discharge. No scleral icterus.  Neck: Normal range of motion. Neck supple.  Cardiovascular: Normal rate, regular rhythm, normal heart sounds and intact distal pulses.   Pulmonary/Chest: Effort normal and breath sounds normal. No respiratory distress. No wheezes. No rales.  Abdominal: Soft. Bowel sounds are normal. Exhibits no distension and no mass. There is no tenderness.  Musculoskeletal: Normal range of motion. Exhibits no edema.  Lymphadenopathy:    No cervical adenopathy.  Neurological: Alert and oriented to person, place, and time. Exhibits normal muscle tone. Gait normal. Coordination normal.  Skin: Skin is warm and dry. No rash noted. Not diaphoretic. No erythema. No pallor.  Psychiatric: Mood, memory and judgment normal.  Vitals reviewed.  LABORATORY DATA: Lab Results  Component Value Date   WBC 9.7 12/05/2021   HGB 10.8 (L) 12/05/2021   HCT 31.2 (L) 12/05/2021   MCV 89.4 12/05/2021   PLT 310 12/05/2021      Chemistry      Component Value Date/Time   NA 140  12/05/2021 1035   NA 143 08/28/2021 0811   K 3.7 12/05/2021 1035   CL 109 12/05/2021 1035   CO2 24 12/05/2021 1035   BUN 10 12/05/2021 1035   BUN 8 08/28/2021 0811   CREATININE 0.56 12/05/2021 1035      Component Value Date/Time   CALCIUM 8.8 (L) 12/05/2021 1035   ALKPHOS 167 (H) 12/05/2021 1035   AST 21 12/05/2021 1035   ALT 21 12/05/2021 1035   BILITOT 1.2 12/05/2021 1035       RADIOGRAPHIC STUDIES:  CT Chest W Contrast  Result Date: 11/30/2021 CLINICAL DATA:  Follow-up non-small cell lung carcinoma. Ongoing chemotherapy. Evaluate treatment response. * Tracking Code: BO * EXAM: CT CHEST, ABDOMEN, AND PELVIS WITH CONTRAST TECHNIQUE: Multidetector CT imaging of the chest, abdomen and pelvis was performed following the standard protocol during bolus administration of intravenous contrast. RADIATION DOSE REDUCTION: This exam was performed according to the departmental dose-optimization program which includes automated exposure control, adjustment of the mA and/or kV according to patient size and/or use of iterative reconstruction technique. CONTRAST:  145m OMNIPAQUE IOHEXOL 300 MG/ML  SOLN COMPARISON:  06/23/2021 FINDINGS: CT CHEST FINDINGS Cardiovascular: No acute findings. Mediastinum/Lymph Nodes: No masses or pathologically enlarged lymph nodes identified. Lungs/Pleura: Innumerable sub-cm pulmonary nodules are now seen throughout both lungs which are increased in size and number, consistent with progression of diffuse pulmonary metastases. Dominant nodule now  seen in the anteromedial left upper lobe measures 1.5 x 1.3 cm on image 72/4, compared to 1.0 x 1.0 cm previously. No evidence of pulmonary consolidation or pleural effusion. Musculoskeletal: Numerous small less than 1 cm sclerotic bone lesions are seen throughout the spine sternum, scapulae, clavicles, which have increased and are consistent with diffuse sclerotic bone metastases. CT ABDOMEN AND PELVIS FINDINGS Hepatobiliary: No masses  identified. Gallbladder is unremarkable. No evidence of biliary ductal dilatation. Pancreas:  No mass or inflammatory changes. Spleen:  Within normal limits in size and appearance. Adrenals/Urinary tract:  No masses or hydronephrosis. Stomach/Bowel: No evidence of obstruction, inflammatory process, or abnormal fluid collections. Vascular/Lymphatic: Sub-centimeter mesenteric and retroperitoneal lymph nodes are again noted. No pathologically enlarged lymph nodes identified. No acute vascular findings. Reproductive:  No mass or other significant abnormality identified. Other:  None. Musculoskeletal: Increased size and number of numerous tiny sclerotic lesions are seen throughout the lumbar spine and pelvis, consistent with progression of diffuse sclerotic bone metastases. IMPRESSION: Interval progression of diffuse bilateral pulmonary metastases. Interval progression of diffuse sclerotic bone metastases. No definite soft tissue metastatic disease within the abdomen or pelvis. Electronically Signed   By: Marlaine Hind M.D.   On: 11/30/2021 16:01   CT Abdomen Pelvis W Contrast  Result Date: 11/30/2021 CLINICAL DATA:  Follow-up non-small cell lung carcinoma. Ongoing chemotherapy. Evaluate treatment response. * Tracking Code: BO * EXAM: CT CHEST, ABDOMEN, AND PELVIS WITH CONTRAST TECHNIQUE: Multidetector CT imaging of the chest, abdomen and pelvis was performed following the standard protocol during bolus administration of intravenous contrast. RADIATION DOSE REDUCTION: This exam was performed according to the departmental dose-optimization program which includes automated exposure control, adjustment of the mA and/or kV according to patient size and/or use of iterative reconstruction technique. CONTRAST:  12m OMNIPAQUE IOHEXOL 300 MG/ML  SOLN COMPARISON:  06/23/2021 FINDINGS: CT CHEST FINDINGS Cardiovascular: No acute findings. Mediastinum/Lymph Nodes: No masses or pathologically enlarged lymph nodes identified.  Lungs/Pleura: Innumerable sub-cm pulmonary nodules are now seen throughout both lungs which are increased in size and number, consistent with progression of diffuse pulmonary metastases. Dominant nodule now seen in the anteromedial left upper lobe measures 1.5 x 1.3 cm on image 72/4, compared to 1.0 x 1.0 cm previously. No evidence of pulmonary consolidation or pleural effusion. Musculoskeletal: Numerous small less than 1 cm sclerotic bone lesions are seen throughout the spine sternum, scapulae, clavicles, which have increased and are consistent with diffuse sclerotic bone metastases. CT ABDOMEN AND PELVIS FINDINGS Hepatobiliary: No masses identified. Gallbladder is unremarkable. No evidence of biliary ductal dilatation. Pancreas:  No mass or inflammatory changes. Spleen:  Within normal limits in size and appearance. Adrenals/Urinary tract:  No masses or hydronephrosis. Stomach/Bowel: No evidence of obstruction, inflammatory process, or abnormal fluid collections. Vascular/Lymphatic: Sub-centimeter mesenteric and retroperitoneal lymph nodes are again noted. No pathologically enlarged lymph nodes identified. No acute vascular findings. Reproductive:  No mass or other significant abnormality identified. Other:  None. Musculoskeletal: Increased size and number of numerous tiny sclerotic lesions are seen throughout the lumbar spine and pelvis, consistent with progression of diffuse sclerotic bone metastases. IMPRESSION: Interval progression of diffuse bilateral pulmonary metastases. Interval progression of diffuse sclerotic bone metastases. No definite soft tissue metastatic disease within the abdomen or pelvis. Electronically Signed   By: JMarlaine HindM.D.   On: 11/30/2021 16:01     ASSESSMENT/PLAN:  This is a pleasant 36year old caucasian female diagnosed with IV non-small cell lung cancer, adenocarcinoma with positive ALK gene translocation in  March 2021.    The patient started treatment with Alecensa on July 23, 2019 and within few days she started not significant improvement in her condition with less shortness of breath as well as decrease in supraclavicular lymphadenopathy.  She is currently on treatment with Alecensa 600 mg p.o. twice daily status post 29 months of treatment.  treatment. The patient was lost to follow-up for the last 6 months but she continues to do fine and she is taking her medication with Alecensa (Alectinib) as prescribed.  In August 2023, she had a CT scan showed progression of the diffuse bilateral pulmonary metastasis in addition to progressive diffuse sclerotic bone metastasis. Therefore, Dr. Julien Nordmann recommended discontinuing her her current treatment with Alecensa (Alectinib) in the next few days once we have the next treatment option available.  Therefore, she was started on Lorlatinib 100 mg p.o. daily. She started this on 12/06/21.   We will see her back for a follow up visit in 2 weeks for evaluation and repeat blood work.   The patient was advised to call immediately if she has any concerning symptoms in the interval. The patient voices understanding of current disease status and treatment options and is in agreement with the current care plan. All questions were answered. The patient knows to call the clinic with any problems, questions or concerns. We can certainly see the patient much sooner if necessary   No orders of the defined types were placed in this encounter.    The total time spent in the appointment was 20-29 minutes.   Sarah Angulo L Ivonne [place], PA-C 12/22/21

## 2021-12-25 ENCOUNTER — Other Ambulatory Visit (HOSPITAL_COMMUNITY): Payer: Self-pay

## 2021-12-25 ENCOUNTER — Other Ambulatory Visit: Payer: Self-pay

## 2021-12-25 DIAGNOSIS — C3491 Malignant neoplasm of unspecified part of right bronchus or lung: Secondary | ICD-10-CM

## 2021-12-26 ENCOUNTER — Other Ambulatory Visit: Payer: Self-pay

## 2021-12-26 ENCOUNTER — Inpatient Hospital Stay (HOSPITAL_BASED_OUTPATIENT_CLINIC_OR_DEPARTMENT_OTHER): Payer: Medicaid Other | Admitting: Physician Assistant

## 2021-12-26 ENCOUNTER — Inpatient Hospital Stay: Payer: Medicaid Other | Attending: Internal Medicine

## 2021-12-26 VITALS — BP 145/78 | HR 72 | Temp 98.6°F | Resp 17 | Wt 280.4 lb

## 2021-12-26 DIAGNOSIS — C3491 Malignant neoplasm of unspecified part of right bronchus or lung: Secondary | ICD-10-CM

## 2021-12-26 DIAGNOSIS — C3490 Malignant neoplasm of unspecified part of unspecified bronchus or lung: Secondary | ICD-10-CM | POA: Diagnosis not present

## 2021-12-26 DIAGNOSIS — C7931 Secondary malignant neoplasm of brain: Secondary | ICD-10-CM | POA: Diagnosis not present

## 2021-12-26 DIAGNOSIS — C7951 Secondary malignant neoplasm of bone: Secondary | ICD-10-CM | POA: Diagnosis not present

## 2021-12-26 LAB — CMP (CANCER CENTER ONLY)
ALT: 29 U/L (ref 0–44)
AST: 24 U/L (ref 15–41)
Albumin: 4 g/dL (ref 3.5–5.0)
Alkaline Phosphatase: 79 U/L (ref 38–126)
Anion gap: 4 — ABNORMAL LOW (ref 5–15)
BUN: 6 mg/dL (ref 6–20)
CO2: 27 mmol/L (ref 22–32)
Calcium: 9.1 mg/dL (ref 8.9–10.3)
Chloride: 108 mmol/L (ref 98–111)
Creatinine: 0.65 mg/dL (ref 0.44–1.00)
GFR, Estimated: >60 mL/min (ref >60)
Glucose, Bld: 98 mg/dL (ref 70–99)
Potassium: 4 mmol/L (ref 3.5–5.1)
Sodium: 139 mmol/L (ref 135–145)
Total Bilirubin: 0.3 mg/dL (ref 0.3–1.2)
Total Protein: 7.2 g/dL (ref 6.5–8.1)

## 2021-12-26 LAB — CBC WITH DIFFERENTIAL (CANCER CENTER ONLY)
Abs Immature Granulocytes: 0.01 10*3/uL (ref 0.00–0.07)
Basophils Absolute: 0.1 10*3/uL (ref 0.0–0.1)
Basophils Relative: 1 %
Eosinophils Absolute: 0.2 10*3/uL (ref 0.0–0.5)
Eosinophils Relative: 2 %
HCT: 34.5 % — ABNORMAL LOW (ref 36.0–46.0)
Hemoglobin: 11.3 g/dL — ABNORMAL LOW (ref 12.0–15.0)
Immature Granulocytes: 0 %
Lymphocytes Relative: 35 %
Lymphs Abs: 3 10*3/uL (ref 0.7–4.0)
MCH: 30 pg (ref 26.0–34.0)
MCHC: 32.8 g/dL (ref 30.0–36.0)
MCV: 91.5 fL (ref 80.0–100.0)
Monocytes Absolute: 0.8 10*3/uL (ref 0.1–1.0)
Monocytes Relative: 9 %
Neutro Abs: 4.6 10*3/uL (ref 1.7–7.7)
Neutrophils Relative %: 53 %
Platelet Count: 264 10*3/uL (ref 150–400)
RBC: 3.77 MIL/uL — ABNORMAL LOW (ref 3.87–5.11)
RDW: 13.4 % (ref 11.5–15.5)
WBC Count: 8.6 10*3/uL (ref 4.0–10.5)
nRBC: 0 % (ref 0.0–0.2)

## 2021-12-27 ENCOUNTER — Other Ambulatory Visit (HOSPITAL_COMMUNITY): Payer: Self-pay

## 2021-12-28 ENCOUNTER — Other Ambulatory Visit (HOSPITAL_COMMUNITY): Payer: Self-pay

## 2021-12-29 ENCOUNTER — Other Ambulatory Visit (HOSPITAL_COMMUNITY): Payer: Self-pay

## 2022-01-01 ENCOUNTER — Other Ambulatory Visit (HOSPITAL_COMMUNITY): Payer: Self-pay

## 2022-01-02 ENCOUNTER — Other Ambulatory Visit (HOSPITAL_COMMUNITY): Payer: Self-pay

## 2022-01-04 NOTE — Progress Notes (Signed)
[place] [place] OFFICE PROGRESS NOTE  Ronnell Freshwater, NP [place] [address]  DIAGNOSIS: Stage IV (T3, N2, M1c) non-small cell lung cancer, adenocarcinoma with ALK gene translocation presented with multiple bilateral pulmonary nodules and masses as well as small mediastinal and bilateral axillary lymphadenopathy and metastatic disease to the brain diagnosed in March 2021.  PRIOR THERAPY: Alecensa (Alectinib) 600 mg p.o. twice daily.  First dose started on July 23 2019.  Status post 29 months of treatment.  CURRENT THERAPY: Lorlatinib 100 mg p.o. daily.  First dose on 12/06/21. Status post 1 month of treatment.   INTERVAL HISTORY: Sarah Chandler 36 y.o. female returns to the clinic for a follow up visit accompanied by her cousin. The patient is feeling well today without any concerning complaints except for significant mood swings. She states she "snaps" at things that do not normally make her "snap". Additionally, she is having periods of feeling almost elevated and euphoric. She would be interested in seeing a psychiatrist in her insurance network. She does not feel that she needs a psychiatrist for anxiety/stress around her malignancy. She is hoping to see someone who can help her mood swings.  She states that she is particularly snapping at her grandma.  Sometimes the mood swings are associated with her menstrual cycle.  She has been under a lot of stress recently and maybe once or twice a week has a tension headache in the right side of her head.  She reports the headaches are provoked by intense pressure/stress.  It is generally not bad enough where she needs to take a Tylenol.  However the patient was recently found to have disease progression a few weeks ago before her treatment was changed to a lorlatinib.  She did not have any restaging neuroimaging at that time.   Otherwise, her energy is a lot better since being on lorlatinib.  She has been on this  medication for approximately 1 month. Denies any fever, chills, night sweats, or weight loss. Denies any chest pain, significant shortness of breath, significant cough, or hemoptysis. She may have shortness of breath with allergies, poor air quality,  or around smoke. She only coughs associated with allergies. Denies any nausea, vomiting, diarrhea, or constipation. Denies any rashes or skin changes. The patient is here today for evaluation and repeat blood work.      MEDICAL HISTORY: Past Medical History:  Diagnosis Date   nscl ca dx'd 06/2019   Pneumonia 05/08/2019    ALLERGIES:  is allergic to iron.  MEDICATIONS:  Current Outpatient Medications  Medication Sig Dispense Refill   acetaminophen (TYLENOL) 500 MG tablet Take 1,000 mg by mouth every 6 (six) hours as needed for moderate pain or headache.     albuterol (VENTOLIN HFA) 108 (90 Base) MCG/ACT inhaler Inhale 2 puffs into the lungs every 6 (six) hours as needed for wheezing or shortness of breath. 8 g 1   ergocalciferol (VITAMIN D2) 1.25 MG (50000 UT) capsule Take 1 capsule (50,000 Units total) by mouth 2 (two) times a week. 26 capsule 3   hydrOXYzine (ATARAX/VISTARIL) 25 MG tablet Take 1 tablet (25 mg total) by mouth 3 (three) times daily as needed for anxiety. **NEEDS APT FOR FURTHER REFILL** 30 tablet 0   levothyroxine (SYNTHROID) 88 MCG tablet Take 1 tablet (88 mcg total) by mouth daily. 90 tablet 3   lorlatinib (LORBRENA) 100 MG tablet Take 1 tablet (100 mg total) by mouth daily. Swallow tablets  whole. Do not chew, crush or split tablets. 30 tablet 2   No current facility-administered medications for this visit.    SURGICAL HISTORY:  Past Surgical History:  Procedure Laterality Date   BIOPSY  07/03/2019   Procedure: BIOPSY;  Surgeon: Rigoberto Noel, MD;  Location: [facility] ENDOSCOPY;  Service: Cardiopulmonary;;   BRONCHIAL BRUSHINGS  07/03/2019   Procedure: BRONCHIAL BRUSHINGS;  Surgeon: Rigoberto Noel, MD;  Location: [facility];   Service: Cardiopulmonary;;   BRONCHIAL WASHINGS  07/03/2019   Procedure: BRONCHIAL WASHINGS;  Surgeon: Rigoberto Noel, MD;  Location: [facility];  Service: Cardiopulmonary;;   NO PAST SURGERIES     TOOTH EXTRACTION     VIDEO BRONCHOSCOPY N/A 07/03/2019   Procedure: VIDEO BRONCHOSCOPY WITH FLUORO;  Surgeon: Rigoberto Noel, MD;  Location: [facility];  Service: Cardiopulmonary;  Laterality: N/A;  LMA    REVIEW OF SYSTEMS:   Constitutional: Improving fatigue. Negative for appetite change, chills, fever and unexpected weight change.  HENT: Negative for mouth sores, nosebleeds, sore throat and trouble swallowing.   Eyes: Negative for eye problems and icterus.  Respiratory: Negative for significant cough, hemoptysis, significant shortness of breath and wheezing.   Cardiovascular: Negative for chest pain and leg swelling.  Gastrointestinal: Negative for abdominal pain, constipation, diarrhea, nausea and vomiting.  Genitourinary: Negative for bladder incontinence, difficulty urinating, dysuria, frequency and hematuria.   Musculoskeletal: Negative for back pain, gait problem, neck pain and neck stiffness.  Skin: Negative for itching and rash.  Neurological: Negative for dizziness, extremity weakness, gait problem, headaches, light-headedness and seizures.  Hematological: Negative for adenopathy. Does not bruise/bleed easily.  Psychiatric/Behavioral: Positive for mood swings. Negative for confusion, depression and sleep disturbance. The patient is not nervous/anxious.     PHYSICAL EXAMINATION:  Blood pressure (!) 150/81, pulse 89, temperature 97.6 F (36.4 C), temperature source Tympanic, resp. rate 18, weight 284 lb 4 oz (128.9 kg), SpO2 98 %.  ECOG PERFORMANCE STATUS: 1  Physical Exam  Constitutional: Oriented to person, place, and time and well-developed, well-nourished, and in no distress. No distress.  HENT:  Head: Normocephalic and atraumatic.  Mouth/Throat: Oropharynx is clear and  moist. No oropharyngeal exudate.  Eyes: Conjunctivae are normal. Right eye exhibits no discharge. Left eye exhibits no discharge. No scleral icterus.  Neck: Normal range of motion. Neck supple.  Cardiovascular: Normal rate, regular rhythm, normal heart sounds and intact distal pulses.   Pulmonary/Chest: Effort normal and breath sounds normal. No respiratory distress. No wheezes. No rales.  Abdominal: Soft. Bowel sounds are normal. Exhibits no distension and no mass. There is no tenderness.  Musculoskeletal: Normal range of motion. Exhibits no edema.  Lymphadenopathy:    No cervical adenopathy.  Neurological: Alert and oriented to person, place, and time. Exhibits normal muscle tone. Gait normal. Coordination normal.  Skin: Skin is warm and dry. No rash noted. Not diaphoretic. No erythema. No pallor.  Psychiatric: Mood, memory and judgment normal.  Vitals reviewed.  LABORATORY DATA: Lab Results  Component Value Date   WBC 8.9 01/09/2022   HGB 11.9 (L) 01/09/2022   HCT 35.5 (L) 01/09/2022   MCV 88.1 01/09/2022   PLT 277 01/09/2022      Chemistry      Component Value Date/Time   NA 141 01/09/2022 0755   NA 143 08/28/2021 0811   K 3.6 01/09/2022 0755   CL 109 01/09/2022 0755   CO2 26 01/09/2022 0755   BUN 10 01/09/2022 0755   BUN 8 08/28/2021 1102  CREATININE 0.68 01/09/2022 0755      Component Value Date/Time   CALCIUM 8.8 (L) 01/09/2022 0755   ALKPHOS 59 01/09/2022 0755   AST 23 01/09/2022 0755   ALT 25 01/09/2022 0755   BILITOT 0.3 01/09/2022 0755       RADIOGRAPHIC STUDIES:  No results found.   ASSESSMENT/PLAN:  This is a pleasant 36 year old caucasian female diagnosed with IV non-small cell lung cancer, adenocarcinoma with positive ALK gene translocation in March 2021.     The patient started treatment with Alecensa on July 23, 2019 and within few days she started not significant improvement in her condition with less shortness of breath as well as decrease in  supraclavicular lymphadenopathy.  She is currently on treatment with Alecensa 600 mg p.o. twice daily status post 29 months of treatment.  The patient was lost to follow-up for the last 6 months but she continued to do fine and she was taking her medication with Alecensa (Alectinib) as prescribed.   In August 2023, she had a CT scan showed progression of the diffuse bilateral pulmonary metastasis in addition to progressive diffuse sclerotic bone metastasis. Therefore, Dr. Julien Nordmann recommended discontinuing her treatment with Alecensa (Alectinib).   Therefore, she was started on Lorlatinib 100 mg p.o. daily. She started this on 12/06/21. She has been on this for about 1 month.  She has been tolerating this well and notes improvement in her energy.  However she has been having more severe mood swings than at her baseline.  The patient is interested in seeing a psychiatrist.  She states in addition to her mood swings, she also wants to be evaluated as she is concerned that she may be on the spectrum.   After discussion about this, the patient is going to check her insurance company website and see who is in network's.  She is going to read the profile of the providers/psychiatrist and is going to let me know which provider she is interested in seeing based on her needs.   The patient was seen with Dr. Julien Nordmann today.  We will see her back for a follow up visit in 4 weeks for evaluation and repeat blood work.   I will arrange for restaging CT scan the chest, abdomen, and pelvis.  The patient has been reporting intermittent headaches which she attributes to stress.  However, given that she has had significant disease progression recently and has not had repeat neuroimaging, we will add a CT with and without contrast of the head to her scan.   The patient was advised to call immediately if she has any concerning symptoms in the interval. The patient voices understanding of current disease status and treatment  options and is in agreement with the current care plan. All questions were answered. The patient knows to call the clinic with any problems, questions or concerns. We can certainly see the patient much sooner if necessary        Orders Placed This Encounter  Procedures   CT Chest W Contrast    Standing Status:   Future    Standing Expiration Date:   01/09/2023    Order Specific Question:   If indicated for the ordered procedure, I authorize the administration of contrast media per Radiology protocol    Answer:   Yes    Order Specific Question:   Is patient pregnant?    Answer:   No    Order Specific Question:   Preferred imaging location?  Answer:   [facility]   CT Abdomen Pelvis W Contrast    Standing Status:   Future    Standing Expiration Date:   01/09/2023    Order Specific Question:   If indicated for the ordered procedure, I authorize the administration of contrast media per Radiology protocol    Answer:   Yes    Order Specific Question:   Is patient pregnant?    Answer:   No    Order Specific Question:   Preferred imaging location?    Answer:   [facility]    Order Specific Question:   Is Oral Contrast requested for this exam?    Answer:   Yes, Per Radiology protocol   CT HEAD W & WO CONTRAST (5MM)    Standing Status:   Future    Standing Expiration Date:   01/10/2023    Order Specific Question:   If indicated for the ordered procedure, I authorize the administration of contrast media per Radiology protocol    Answer:   Yes    Order Specific Question:   Is patient pregnant?    Answer:   No    Order Specific Question:   Preferred imaging location?    Answer:   [facility]   CBC with Differential ([place] Only)    Standing Status:   Future    Standing Expiration Date:   01/10/2023   CMP ([place] only)    Standing Status:   Future    Standing Expiration Date:   01/10/2023     Tobe Sos Ilhan Debenedetto,  PA-C 01/09/22  ADDENDUM: Hematology/Oncology Attending: I had a face-to-face encounter with the patient today.  I reviewed her records, lab and recommended her care plan.  This is a very pleasant 36 years old white female diagnosed with a stage IV non-small cell lung cancer, adenocarcinoma with positive ALK gene translocation in March 2021.  The patient started treatment with Alecensa (Alectinib) 600 mg p.o. twice daily in April 2021 and she continued on this treatment for 29 months before it was discontinued secondary to disease progression. She is currently on treatment with Lorlatinib 100 mg p.o. daily status post 1 months of treatment.  She has been tolerating this treatment well except for mild swelling and depression. We will try to refer the patient to psychiatry for further evaluation of her condition. She was also noted on her blood work to have dyslipidemia which is secondary to her treatment with Lorlatinib.  I will start the patient on Lipitor 20 mg p.o. daily. We will see the patient back for follow-up visit in 1 months for evaluation with repeat blood work as well as CT scan of the chest, abdomen and pelvis for restaging of her disease. The patient was advised to call immediately if she has any other concerning symptoms in the interval. The total time spent in the appointment was 30 minutes. Disclaimer: This note was dictated with voice recognition software. Similar sounding words can inadvertently be transcribed and may be missed upon review. Eilleen Kempf, MD

## 2022-01-08 ENCOUNTER — Other Ambulatory Visit: Payer: Self-pay

## 2022-01-08 DIAGNOSIS — C3491 Malignant neoplasm of unspecified part of right bronchus or lung: Secondary | ICD-10-CM

## 2022-01-09 ENCOUNTER — Encounter: Payer: Self-pay | Admitting: Physician Assistant

## 2022-01-09 ENCOUNTER — Other Ambulatory Visit (HOSPITAL_COMMUNITY): Payer: Self-pay

## 2022-01-09 ENCOUNTER — Inpatient Hospital Stay: Payer: Medicaid Other | Attending: Internal Medicine

## 2022-01-09 ENCOUNTER — Other Ambulatory Visit: Payer: Self-pay | Admitting: Physician Assistant

## 2022-01-09 ENCOUNTER — Inpatient Hospital Stay (HOSPITAL_BASED_OUTPATIENT_CLINIC_OR_DEPARTMENT_OTHER): Payer: Medicaid Other | Admitting: Physician Assistant

## 2022-01-09 VITALS — BP 150/81 | HR 89 | Temp 97.6°F | Resp 18 | Wt 284.2 lb

## 2022-01-09 DIAGNOSIS — E785 Hyperlipidemia, unspecified: Secondary | ICD-10-CM | POA: Insufficient documentation

## 2022-01-09 DIAGNOSIS — R4586 Emotional lability: Secondary | ICD-10-CM

## 2022-01-09 DIAGNOSIS — C3490 Malignant neoplasm of unspecified part of unspecified bronchus or lung: Secondary | ICD-10-CM | POA: Diagnosis present

## 2022-01-09 DIAGNOSIS — R519 Headache, unspecified: Secondary | ICD-10-CM | POA: Diagnosis not present

## 2022-01-09 DIAGNOSIS — E039 Hypothyroidism, unspecified: Secondary | ICD-10-CM

## 2022-01-09 DIAGNOSIS — F4541 Pain disorder exclusively related to psychological factors: Secondary | ICD-10-CM | POA: Diagnosis not present

## 2022-01-09 DIAGNOSIS — C3491 Malignant neoplasm of unspecified part of right bronchus or lung: Secondary | ICD-10-CM | POA: Diagnosis not present

## 2022-01-09 DIAGNOSIS — C7951 Secondary malignant neoplasm of bone: Secondary | ICD-10-CM | POA: Insufficient documentation

## 2022-01-09 DIAGNOSIS — C7931 Secondary malignant neoplasm of brain: Secondary | ICD-10-CM | POA: Insufficient documentation

## 2022-01-09 LAB — CMP (CANCER CENTER ONLY)
ALT: 25 U/L (ref 0–44)
AST: 23 U/L (ref 15–41)
Albumin: 3.9 g/dL (ref 3.5–5.0)
Alkaline Phosphatase: 59 U/L (ref 38–126)
Anion gap: 6 (ref 5–15)
BUN: 10 mg/dL (ref 6–20)
CO2: 26 mmol/L (ref 22–32)
Calcium: 8.8 mg/dL — ABNORMAL LOW (ref 8.9–10.3)
Chloride: 109 mmol/L (ref 98–111)
Creatinine: 0.68 mg/dL (ref 0.44–1.00)
GFR, Estimated: >60 mL/min (ref >60)
Glucose, Bld: 98 mg/dL (ref 70–99)
Potassium: 3.6 mmol/L (ref 3.5–5.1)
Sodium: 141 mmol/L (ref 135–145)
Total Bilirubin: 0.3 mg/dL (ref 0.3–1.2)
Total Protein: 6.7 g/dL (ref 6.5–8.1)

## 2022-01-09 LAB — CBC WITH DIFFERENTIAL (CANCER CENTER ONLY)
Abs Immature Granulocytes: 0.02 10*3/uL (ref 0.00–0.07)
Basophils Absolute: 0 10*3/uL (ref 0.0–0.1)
Basophils Relative: 1 %
Eosinophils Absolute: 0.3 10*3/uL (ref 0.0–0.5)
Eosinophils Relative: 3 %
HCT: 35.5 % — ABNORMAL LOW (ref 36.0–46.0)
Hemoglobin: 11.9 g/dL — ABNORMAL LOW (ref 12.0–15.0)
Immature Granulocytes: 0 %
Lymphocytes Relative: 40 %
Lymphs Abs: 3.6 10*3/uL (ref 0.7–4.0)
MCH: 29.5 pg (ref 26.0–34.0)
MCHC: 33.5 g/dL (ref 30.0–36.0)
MCV: 88.1 fL (ref 80.0–100.0)
Monocytes Absolute: 0.8 10*3/uL (ref 0.1–1.0)
Monocytes Relative: 9 %
Neutro Abs: 4.2 10*3/uL (ref 1.7–7.7)
Neutrophils Relative %: 47 %
Platelet Count: 277 10*3/uL (ref 150–400)
RBC: 4.03 MIL/uL (ref 3.87–5.11)
RDW: 13 % (ref 11.5–15.5)
WBC Count: 8.9 10*3/uL (ref 4.0–10.5)
nRBC: 0 % (ref 0.0–0.2)

## 2022-01-09 LAB — LIPID PANEL
Cholesterol: 343 mg/dL — ABNORMAL HIGH (ref 0–200)
HDL: 54 mg/dL (ref >40)
LDL Cholesterol: 256 mg/dL — ABNORMAL HIGH (ref 0–99)
Total CHOL/HDL Ratio: 6.4 RATIO
Triglycerides: 166 mg/dL — ABNORMAL HIGH (ref <150)
VLDL: 33 mg/dL (ref 0–40)

## 2022-01-09 MED ORDER — ATORVASTATIN CALCIUM 20 MG PO TABS: 20.0000 mg | ORAL_TABLET | Freq: Every day | ORAL | 2 refills | Status: DC

## 2022-01-12 ENCOUNTER — Telehealth: Payer: Self-pay | Admitting: Internal Medicine

## 2022-01-12 NOTE — Telephone Encounter (Signed)
Called patient regarding upcoming October appointment, patient is notified.   

## 2022-01-18 ENCOUNTER — Telehealth: Payer: Self-pay

## 2022-01-18 ENCOUNTER — Other Ambulatory Visit: Payer: Self-pay | Admitting: Physician Assistant

## 2022-01-18 DIAGNOSIS — R4586 Emotional lability: Secondary | ICD-10-CM

## 2022-01-18 NOTE — Telephone Encounter (Signed)
I am emailed a new patient referral to Dr. Kathrynn Speed at [email or web address].

## 2022-01-23 ENCOUNTER — Other Ambulatory Visit (HOSPITAL_COMMUNITY): Payer: Self-pay

## 2022-01-30 ENCOUNTER — Other Ambulatory Visit (HOSPITAL_COMMUNITY): Payer: Self-pay

## 2022-02-01 ENCOUNTER — Other Ambulatory Visit (HOSPITAL_COMMUNITY): Payer: Self-pay

## 2022-02-02 ENCOUNTER — Ambulatory Visit (HOSPITAL_COMMUNITY)
Admission: RE | Admit: 2022-02-02 | Discharge: 2022-02-02 | Disposition: A | Discharge status: Home / Self Care | Payer: Medicaid Other | Source: Ambulatory Visit | Attending: Physician Assistant | Admitting: Physician Assistant

## 2022-02-02 ENCOUNTER — Other Ambulatory Visit (HOSPITAL_COMMUNITY): Payer: Self-pay

## 2022-02-02 DIAGNOSIS — R519 Headache, unspecified: Secondary | ICD-10-CM | POA: Diagnosis not present

## 2022-02-02 DIAGNOSIS — C3491 Malignant neoplasm of unspecified part of right bronchus or lung: Secondary | ICD-10-CM | POA: Diagnosis present

## 2022-02-02 DIAGNOSIS — R918 Other nonspecific abnormal finding of lung field: Secondary | ICD-10-CM | POA: Diagnosis not present

## 2022-02-02 DIAGNOSIS — C3490 Malignant neoplasm of unspecified part of unspecified bronchus or lung: Secondary | ICD-10-CM | POA: Diagnosis not present

## 2022-02-02 MED ORDER — SODIUM CHLORIDE (PF) 0.9 % IJ SOLN
INTRAMUSCULAR | Status: AC
  Filled 2022-02-02: qty 50

## 2022-02-02 MED ORDER — IOHEXOL 300 MG/ML  SOLN
100.0000 mL | Freq: Once | INTRAMUSCULAR | Status: AC | PRN
  Administered 2022-02-02: 100 mL via INTRAVENOUS

## 2022-02-04 NOTE — Progress Notes (Unsigned)
[place] OFFICE PROGRESS NOTE  Ronnell Freshwater, NP [place] [address]  DIAGNOSIS: Stage IV (T3, N2, M1c) non-small cell lung cancer, adenocarcinoma with ALK gene translocation presented with multiple bilateral pulmonary nodules and masses as well as small mediastinal and bilateral axillary lymphadenopathy and metastatic disease to the brain diagnosed in March 2021.  PRIOR THERAPY: Alecensa (Alectinib) 600 mg p.o. twice daily.  First dose started on July 23 2019.  Status post 29 months of treatment.  CURRENT THERAPY:  Lorlatinib 100 mg p.o. daily.  First dose on 12/06/21. Status post 2 month of treatment.   INTERVAL HISTORY: CHERICA HEIDEN 36 y.o. female returns to the clinic today for a follow-up visit accompanied by her cousin.  The patient was last seen approximately 1 month ago.  The patient is currently undergoing targeted treatment with lorlatinib.  She is tolerating this well without any concerns other than mood swings.  She was endorsing worsening mood swings for which she expressed interest in seeing a psychiatrist at her last appointment.  A referral was placed to continue practice of her choice but reportedly she is out of network.  Therefore, she was then referred to another practice of her choice, she has not been contacted by them yet.   She also was endorsing some tension headaches which she thought was related to intense stress.  She reports these have improved since her last visit.  She did have a repeat CT of the head which was negative for metastatic disease to the brain.  Today, she reports getting over a "chest cold" yesterday, which for a few days felt like she had a "rumbling" in her chest, wheezing in her chest, cough. She reports this cleared last night. She is feeling a lot better today. She reports prior bouts of this in the past, most recently when she had pneumonia. She also reports some fatigue. Her scan did not show any  signs of infection.   Otherwise, she reports no other concerns. She has been on this medication for approximately 2 months. Denies any fever, chills, night sweats, or weight loss. Denies any chest pain, significant shortness of breath, significant cough, or hemoptysis. Denies any nausea, vomiting, diarrhea, or constipation.   The patient recently had a repeat CT scan of the chest, head, abdomen, and pelvis.  The patient is here today for evaluation and review her scan results.      MEDICAL HISTORY: Past Medical History:  Diagnosis Date   nscl ca dx'd 06/2019   Pneumonia 05/08/2019    ALLERGIES:  is allergic to iron.  MEDICATIONS:  Current Outpatient Medications  Medication Sig Dispense Refill   acetaminophen (TYLENOL) 500 MG tablet Take 1,000 mg by mouth every 6 (six) hours as needed for moderate pain or headache.     albuterol (VENTOLIN HFA) 108 (90 Base) MCG/ACT inhaler Inhale 2 puffs into the lungs every 6 (six) hours as needed for wheezing or shortness of breath. 8 g 1   atorvastatin (LIPITOR) 20 MG tablet Take 1 tablet (20 mg total) by mouth daily. 30 tablet 2   ergocalciferol (VITAMIN D2) 1.25 MG (50000 UT) capsule Take 1 capsule (50,000 Units total) by mouth 2 (two) times a week. 26 capsule 3   hydrOXYzine (ATARAX/VISTARIL) 25 MG tablet Take 1 tablet (25 mg total) by mouth 3 (three) times daily as needed for anxiety. **NEEDS APT FOR FURTHER REFILL** 30 tablet 0   levothyroxine (SYNTHROID) 88 MCG tablet Take  1 tablet (88 mcg total) by mouth daily. 90 tablet 3   lorlatinib (LORBRENA) 100 MG tablet Take 1 tablet (100 mg total) by mouth daily. Swallow tablets whole. Do not chew, crush or split tablets. 30 tablet 2   No current facility-administered medications for this visit.    SURGICAL HISTORY:  Past Surgical History:  Procedure Laterality Date   BIOPSY  07/03/2019   Procedure: BIOPSY;  Surgeon: Rigoberto Noel, MD;  Location: [facility] ENDOSCOPY;  Service: Cardiopulmonary;;    BRONCHIAL BRUSHINGS  07/03/2019   Procedure: BRONCHIAL BRUSHINGS;  Surgeon: Rigoberto Noel, MD;  Location: [place];  Service: Cardiopulmonary;;   BRONCHIAL WASHINGS  07/03/2019   Procedure: BRONCHIAL WASHINGS;  Surgeon: Rigoberto Noel, MD;  Location: [place];  Service: Cardiopulmonary;;   NO PAST SURGERIES     TOOTH EXTRACTION     VIDEO BRONCHOSCOPY N/A 07/03/2019   Procedure: VIDEO BRONCHOSCOPY WITH FLUORO;  Surgeon: Rigoberto Noel, MD;  Location: [place];  Service: Cardiopulmonary;  Laterality: N/A;  LMA    REVIEW OF SYSTEMS:   Constitutional: Improving fatigue. Negative for appetite change, chills, fever and unexpected weight change.  HENT: Negative for mouth sores, nosebleeds, sore throat and trouble swallowing.   Eyes: Negative for eye problems and icterus.  Respiratory: Improvement in recent URI with wheezing and chest congestion.  Cardiovascular: Negative for chest pain and leg swelling.  Gastrointestinal: Negative for abdominal pain, constipation, diarrhea, nausea and vomiting.  Genitourinary: Negative for bladder incontinence, difficulty urinating, dysuria, frequency and hematuria.   Musculoskeletal: Negative for back pain, gait problem, neck pain and neck stiffness.  Skin: Negative for itching and rash.  Neurological: Negative for dizziness, extremity weakness, gait problem, headaches, light-headedness and seizures.  Hematological: Negative for adenopathy. Does not bruise/bleed easily.  Psychiatric/Behavioral: Positive for mood swings. Negative for confusion, depression and sleep disturbance. The patient is not nervous/anxious.         PHYSICAL EXAMINATION:  Blood pressure (!) 142/68, pulse 72, temperature 98.4 F (36.9 C), temperature source Oral, resp. rate 14, weight 288 lb 11.2 oz (131 kg), SpO2 97 %.  ECOG PERFORMANCE STATUS: 1  Physical Exam  Constitutional: Oriented to person, place, and time and well-developed, well-nourished, and in no distress.  HENT:   Head: Normocephalic and atraumatic.  Mouth/Throat: Oropharynx is clear and moist. No oropharyngeal exudate.  Eyes: Conjunctivae are normal. Right eye exhibits no discharge. Left eye exhibits no discharge. No scleral icterus.  Neck: Normal range of motion. Neck supple.  Cardiovascular: Normal rate, regular rhythm, normal heart sounds.   Pulmonary/Chest: Effort normal and breath sounds normal. No respiratory distress. No wheezes. No rales.  Abdominal: Soft. Exhibits no distension and no mass. There is no tenderness.  Musculoskeletal: Normal range of motion. Exhibits no edema.  Lymphadenopathy:    No cervical adenopathy.  Neurological: Alert and oriented to person, place, and time. Exhibits normal muscle tone. Gait normal. Coordination normal.  Skin: Skin is warm and dry. No rash noted. Not diaphoretic. No erythema. No pallor.  Psychiatric: Mood, memory and judgment normal.  Vitals reviewed.  LABORATORY DATA: Lab Results  Component Value Date   WBC 7.9 02/06/2022   HGB 12.0 02/06/2022   HCT 36.5 02/06/2022   MCV 85.9 02/06/2022   PLT 249 02/06/2022      Chemistry      Component Value Date/Time   NA 140 02/06/2022 1011   NA 143 08/28/2021 0811   K 3.9 02/06/2022 1011   CL 108 02/06/2022 1011  CO2 25 02/06/2022 1011   BUN 11 02/06/2022 1011   BUN 8 08/28/2021 0811   CREATININE 0.66 02/06/2022 1011      Component Value Date/Time   CALCIUM 9.0 02/06/2022 1011   ALKPHOS 54 02/06/2022 1011   AST 20 02/06/2022 1011   ALT 24 02/06/2022 1011   BILITOT 0.3 02/06/2022 1011       RADIOGRAPHIC STUDIES:  CT HEAD W & WO CONTRAST (5MM)  Result Date: 02/05/2022 CLINICAL DATA:  Stage IV lung cancer.  Headaches. EXAM: CT HEAD WITHOUT AND WITH CONTRAST TECHNIQUE: Contiguous axial images were obtained from the base of the skull through the vertex without and with intravenous contrast. RADIATION DOSE REDUCTION: This exam was performed according to the departmental dose-optimization  program which includes automated exposure control, adjustment of the mA and/or kV according to patient size and/or use of iterative reconstruction technique. CONTRAST:  138m OMNIPAQUE IOHEXOL 300 MG/ML  SOLN COMPARISON:  Head MRI 11/18/2020 FINDINGS: Brain: There is no evidence of an acute infarct, intracranial hemorrhage, mass, midline shift, or extra-axial fluid collection. The ventricles and sulci are normal. No abnormal enhancement is identified. Vascular: The dural venous sinuses are grossly patent. Skull: No fracture or suspicious osseous lesion. Sinuses/Orbits: Visualized paranasal sinuses and mastoid air cells are clear. Unremarkable orbits. Other: None. IMPRESSION: Negative head CT. No evidence of metastatic disease. Electronically Signed   By: ALogan BoresM.D.   On: 02/05/2022 17:47   CT Chest W Contrast  Result Date: 02/05/2022 CLINICAL DATA:  History of metastatic non-small cell lung cancer, ongoing chemotherapy. Evaluate treatment response. * Tracking Code: BO * EXAM: CT CHEST, ABDOMEN, AND PELVIS WITH CONTRAST TECHNIQUE: Multidetector CT imaging of the chest, abdomen and pelvis was performed following the standard protocol during bolus administration of intravenous contrast. RADIATION DOSE REDUCTION: This exam was performed according to the departmental dose-optimization program which includes automated exposure control, adjustment of the mA and/or kV according to patient size and/or use of iterative reconstruction technique. CONTRAST:  1017mOMNIPAQUE IOHEXOL 300 MG/ML  SOLN COMPARISON:  Multiple priors including most recent CT chest abdomen and pelvis dated November 29, 2021. FINDINGS: CT CHEST FINDINGS Cardiovascular: Normal caliber thoracic aorta. No central pulmonary embolus on this nondedicated study. Normal size heart. No significant pericardial effusion/thickening. Mediastinum/Nodes: No supraclavicular adenopathy. No suspicious thyroid nodule. Similar crescentic nodular soft tissue  stranding in the anterior mediastinum which conforms with underlying vasculature compatible with remnant thymic tissue. No pathologically enlarged mediastinal, hilar or axillary lymph nodes. Lungs/Pleura: Significant interval decrease in size of the innumerable bilateral pulmonary nodules many of now demonstrate linear band of opacities in the place the previously indexed pulmonary nodule in the anteromedial left upper lobe now measures 8 x 7 mm on image 51/7 previously 15 x 13 mm. No new suspicious pulmonary nodules or masses are confidently identified. No pleural effusion.  No pneumothorax. Musculoskeletal: Slight increased sclerosis in the innumerable diffuse sclerotic osseous lesions throughout the axial skeleton. CT ABDOMEN PELVIS FINDINGS Hepatobiliary: No suspicious hepatic lesion. Gallbladder is unremarkable. No biliary ductal dilation. Pancreas: No pancreatic ductal dilation or evidence of acute inflammation. Spleen: No splenomegaly or focal splenic lesion. Adrenals/Urinary Tract: Bilateral adrenal glands are within normal limits. No hydronephrosis. Kidneys demonstrate symmetric enhancement. Urinary bladder is unremarkable for degree of distension. Stomach/Bowel: Radiopaque enteric contrast material traverses the rectum. Stomach is unremarkable for degree of distension. No pathologic dilation of small or large bowel. Normal appendix. Moderate volume of formed stool throughout the colon suggestive of constipation. No  evidence of acute bowel inflammation. Vascular/Lymphatic: Normal caliber abdominal aorta. No pathologically enlarged mediastinal, hilar or axillary lymph nodes. Decreased size with near-complete resolution of the previously identified prominent mesenteric and retroperitoneal lymph nodes. Reproductive: Uterus and bilateral adnexa are unremarkable in CT appearance for reproductive age female. Other: No significant abdominal free fluid. No discrete peritoneal or omental nodularity. Musculoskeletal:  Slight interval increase sclerosis in the innumerable diffuse sclerotic osseous lesions throughout the axial and proximal appendicular skeleton. IMPRESSION: 1. Decreased size of the innumerable bilateral pulmonary nodules, compatible with treatment response. 2. Slight interval increased sclerosis in the innumerable diffuse sclerotic osseous metastatic lesions throughout the axial and appendicular skeleton, favored to reflect treatment response. 3. No evidence of new or progressive disease in the chest, abdomen or pelvis. 4. Decreased size with near-complete resolution of the previously identified prominent mesenteric and retroperitoneal lymph nodes. 5. Moderate volume of formed stool throughout the colon suggestive of constipation. Electronically Signed   By: Dahlia Bailiff M.D.   On: 02/05/2022 11:36   CT Abdomen Pelvis W Contrast  Result Date: 02/05/2022 CLINICAL DATA:  History of metastatic non-small cell lung cancer, ongoing chemotherapy. Evaluate treatment response. * Tracking Code: BO * EXAM: CT CHEST, ABDOMEN, AND PELVIS WITH CONTRAST TECHNIQUE: Multidetector CT imaging of the chest, abdomen and pelvis was performed following the standard protocol during bolus administration of intravenous contrast. RADIATION DOSE REDUCTION: This exam was performed according to the departmental dose-optimization program which includes automated exposure control, adjustment of the mA and/or kV according to patient size and/or use of iterative reconstruction technique. CONTRAST:  148m OMNIPAQUE IOHEXOL 300 MG/ML  SOLN COMPARISON:  Multiple priors including most recent CT chest abdomen and pelvis dated November 29, 2021. FINDINGS: CT CHEST FINDINGS Cardiovascular: Normal caliber thoracic aorta. No central pulmonary embolus on this nondedicated study. Normal size heart. No significant pericardial effusion/thickening. Mediastinum/Nodes: No supraclavicular adenopathy. No suspicious thyroid nodule. Similar crescentic nodular  soft tissue stranding in the anterior mediastinum which conforms with underlying vasculature compatible with remnant thymic tissue. No pathologically enlarged mediastinal, hilar or axillary lymph nodes. Lungs/Pleura: Significant interval decrease in size of the innumerable bilateral pulmonary nodules many of now demonstrate linear band of opacities in the place the previously indexed pulmonary nodule in the anteromedial left upper lobe now measures 8 x 7 mm on image 51/7 previously 15 x 13 mm. No new suspicious pulmonary nodules or masses are confidently identified. No pleural effusion.  No pneumothorax. Musculoskeletal: Slight increased sclerosis in the innumerable diffuse sclerotic osseous lesions throughout the axial skeleton. CT ABDOMEN PELVIS FINDINGS Hepatobiliary: No suspicious hepatic lesion. Gallbladder is unremarkable. No biliary ductal dilation. Pancreas: No pancreatic ductal dilation or evidence of acute inflammation. Spleen: No splenomegaly or focal splenic lesion. Adrenals/Urinary Tract: Bilateral adrenal glands are within normal limits. No hydronephrosis. Kidneys demonstrate symmetric enhancement. Urinary bladder is unremarkable for degree of distension. Stomach/Bowel: Radiopaque enteric contrast material traverses the rectum. Stomach is unremarkable for degree of distension. No pathologic dilation of small or large bowel. Normal appendix. Moderate volume of formed stool throughout the colon suggestive of constipation. No evidence of acute bowel inflammation. Vascular/Lymphatic: Normal caliber abdominal aorta. No pathologically enlarged mediastinal, hilar or axillary lymph nodes. Decreased size with near-complete resolution of the previously identified prominent mesenteric and retroperitoneal lymph nodes. Reproductive: Uterus and bilateral adnexa are unremarkable in CT appearance for reproductive age female. Other: No significant abdominal free fluid. No discrete peritoneal or omental nodularity.  Musculoskeletal: Slight interval increase sclerosis in the innumerable diffuse sclerotic osseous  lesions throughout the axial and proximal appendicular skeleton. IMPRESSION: 1. Decreased size of the innumerable bilateral pulmonary nodules, compatible with treatment response. 2. Slight interval increased sclerosis in the innumerable diffuse sclerotic osseous metastatic lesions throughout the axial and appendicular skeleton, favored to reflect treatment response. 3. No evidence of new or progressive disease in the chest, abdomen or pelvis. 4. Decreased size with near-complete resolution of the previously identified prominent mesenteric and retroperitoneal lymph nodes. 5. Moderate volume of formed stool throughout the colon suggestive of constipation. Electronically Signed   By: Dahlia Bailiff M.D.   On: 02/05/2022 11:36     ASSESSMENT/PLAN:  This is a pleasant 36 year old caucasian female diagnosed with IV non-small cell lung cancer, adenocarcinoma with positive ALK gene translocation in March 2021.     The patient started treatment with Alecensa on July 23, 2019 and within few days she started not significant improvement in her condition with less shortness of breath as well as decrease in supraclavicular lymphadenopathy.  She is currently on treatment with Alecensa 600 mg p.o. twice daily status post 29 months of treatment.   The patient was lost to follow-up for the last 6 months but she continued to do fine and she was taking her medication with Alecensa (Alectinib) as prescribed.  In August 2023, she had a CT scan showed progression of the diffuse bilateral pulmonary metastasis in addition to progressive diffuse sclerotic bone metastasis. Therefore, Dr. Julien Nordmann recommended discontinuing her treatment with Alecensa (Alectinib).    Therefore, she was started on Lorlatinib 100 mg p.o. daily. She started this on 12/06/21. She has been on this for about 2 month.  She has been tolerating this well except  for mood swings.    However she has been having more severe mood swings than at her baseline.  The patient is interested in seeing a psychiatrist.  She has not been in conctact with them, we will place a call to the office to facilitate. If they are not accepting new patients, then we will refer her to a different practice.   The patient recently had a restaging CT scan performed.  Dr. Julien Nordmann personally and independently reviewed the scan and discussed the results with the patient today.  The scan showed no progression. Dr. Julien Nordmann recommends that she continue on the same treatment at the same dose. Of note, her scan did not show any signs of infection.   Her brain MRI was also negative for metastatic disease to the brain.   We will see her back for follow-up visit in 4 weeks for evaluation and repeat blood work.  The patient was wondering if she can get a tattoo. As long as her CBC does not show leukopenia and as long as she goes to a legitimate facility, there is no reason why a tattoo is prohibited from our stand point with her current treatment with lorlatinib. I also reviewed with the pharmacist who is in agreement.    The patient was advised to call immediately if she has any concerning symptoms in the interval. The patient voices understanding of current disease status and treatment options and is in agreement with the current care plan. All questions were answered. The patient knows to call the clinic with any problems, questions or concerns. We can certainly see the patient much sooner if necessary             Orders Placed This Encounter  Procedures   CBC with Differential ([place] Only)  Standing Status:   Future    Standing Expiration Date:   02/07/2023   CMP ([place] only)    Standing Status:   Future    Standing Expiration Date:   02/07/2023   Lipid panel    Standing Status:   Future    Standing Expiration Date:   02/06/2023      Tobe Sos  Brya Simerly, PA-C 02/06/22  ADDENDUM: Hematology/Oncology Attending: I had a face-to-face encounter with the patient today.  I reviewed her records, lab, scan and recommended her care plan.  This is a very pleasant 36 years old white female diagnosed with a stage IV non-small cell lung cancer, adenocarcinoma in March 2021 with positive ALK gene translocation and brain metastasis at the time of the diagnosis.  The patient was treated with Alecensa (Alectinib) 600 mg p.o. twice daily status post 29 months of treatment but unfortunately she was found to have evidence for disease progression in August 2023. I switched her treatment to Lorlatinib 100 mg p.o. daily started December 06, 2021 and she has been on treatment for the last 2 months. She has been tolerating this treatment fairly well with no concerning adverse effect except for the mood swings.  This is likely related to her treatment with Lorlatinib. She had repeat CT scan of the chest, abdomen and pelvis as well as the head performed recently.  I personally and independently reviewed the scan and discussed the result with the patient and her cousin today. Her scan showed improvement of her disease on the new regimen. I recommended for her to continue her current treatment with Lorlatinib with the same dose for now. For the mood swing and depression, we will refer the patient to psychiatry for evaluation and management of her condition. The patient will come back for follow-up visit in 1 months for evaluation and repeat blood work. 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 daily. She was advised to call immediately if she has any other concerning symptoms in the interval. The total time spent in the appointment was 30 minutes. Disclaimer: This note was dictated with voice recognition software. Similar sounding words can inadvertently be transcribed and may be missed upon  review. Eilleen Kempf, MD

## 2022-02-06 ENCOUNTER — Inpatient Hospital Stay (HOSPITAL_BASED_OUTPATIENT_CLINIC_OR_DEPARTMENT_OTHER): Payer: Medicaid Other | Admitting: Physician Assistant

## 2022-02-06 ENCOUNTER — Inpatient Hospital Stay: Payer: Medicaid Other

## 2022-02-06 ENCOUNTER — Other Ambulatory Visit (HOSPITAL_COMMUNITY): Payer: Self-pay

## 2022-02-06 ENCOUNTER — Telehealth: Payer: Self-pay

## 2022-02-06 ENCOUNTER — Encounter: Payer: Self-pay | Admitting: Physician Assistant

## 2022-02-06 DIAGNOSIS — C3490 Malignant neoplasm of unspecified part of unspecified bronchus or lung: Secondary | ICD-10-CM | POA: Diagnosis not present

## 2022-02-06 DIAGNOSIS — C3491 Malignant neoplasm of unspecified part of right bronchus or lung: Secondary | ICD-10-CM

## 2022-02-06 DIAGNOSIS — E785 Hyperlipidemia, unspecified: Secondary | ICD-10-CM

## 2022-02-06 LAB — CMP (CANCER CENTER ONLY)
ALT: 24 U/L (ref 0–44)
AST: 20 U/L (ref 15–41)
Albumin: 3.7 g/dL (ref 3.5–5.0)
Alkaline Phosphatase: 54 U/L (ref 38–126)
Anion gap: 7 (ref 5–15)
BUN: 11 mg/dL (ref 6–20)
CO2: 25 mmol/L (ref 22–32)
Calcium: 9 mg/dL (ref 8.9–10.3)
Chloride: 108 mmol/L (ref 98–111)
Creatinine: 0.66 mg/dL (ref 0.44–1.00)
GFR, Estimated: >60 mL/min (ref >60)
Glucose, Bld: 90 mg/dL (ref 70–99)
Potassium: 3.9 mmol/L (ref 3.5–5.1)
Sodium: 140 mmol/L (ref 135–145)
Total Bilirubin: 0.3 mg/dL (ref 0.3–1.2)
Total Protein: 7 g/dL (ref 6.5–8.1)

## 2022-02-06 LAB — CBC WITH DIFFERENTIAL (CANCER CENTER ONLY)
Abs Immature Granulocytes: 0.01 10*3/uL (ref 0.00–0.07)
Basophils Absolute: 0.1 10*3/uL (ref 0.0–0.1)
Basophils Relative: 1 %
Eosinophils Absolute: 0.6 10*3/uL — ABNORMAL HIGH (ref 0.0–0.5)
Eosinophils Relative: 8 %
HCT: 36.5 % (ref 36.0–46.0)
Hemoglobin: 12 g/dL (ref 12.0–15.0)
Immature Granulocytes: 0 %
Lymphocytes Relative: 36 %
Lymphs Abs: 2.8 10*3/uL (ref 0.7–4.0)
MCH: 28.2 pg (ref 26.0–34.0)
MCHC: 32.9 g/dL (ref 30.0–36.0)
MCV: 85.9 fL (ref 80.0–100.0)
Monocytes Absolute: 0.7 10*3/uL (ref 0.1–1.0)
Monocytes Relative: 8 %
Neutro Abs: 3.7 10*3/uL (ref 1.7–7.7)
Neutrophils Relative %: 47 %
Platelet Count: 249 10*3/uL (ref 150–400)
RBC: 4.25 MIL/uL (ref 3.87–5.11)
RDW: 13.1 % (ref 11.5–15.5)
WBC Count: 7.9 10*3/uL (ref 4.0–10.5)
nRBC: 0 % (ref 0.0–0.2)

## 2022-02-06 LAB — LIPID PANEL
Cholesterol: 244 mg/dL — ABNORMAL HIGH (ref 0–200)
HDL: 55 mg/dL (ref >40)
LDL Cholesterol: 163 mg/dL — ABNORMAL HIGH (ref 0–99)
Total CHOL/HDL Ratio: 4.4 RATIO
Triglycerides: 132 mg/dL (ref <150)
VLDL: 26 mg/dL (ref 0–40)

## 2022-02-06 MED ORDER — LORLATINIB 100 MG PO TABS
100.0000 mg | ORAL_TABLET | Freq: Every day | ORAL | 2 refills | Status: DC
  Filled 2022-02-06 – 2022-02-20 (×2): qty 30, 30d supply, fill #0
  Filled 2022-03-15 – 2022-03-21 (×2): qty 30, 30d supply, fill #1
  Filled 2022-04-17: qty 30, 30d supply, fill #2

## 2022-02-06 NOTE — Telephone Encounter (Signed)
This nurse reached out to Kathrynn Speed, Psychology office related to a referral.  Left a message for a return call.  No further concerns at this time.

## 2022-02-13 ENCOUNTER — Encounter: Payer: Self-pay | Admitting: Internal Medicine

## 2022-02-13 NOTE — Telephone Encounter (Signed)
I called the patient to give her an update on the communication we have had with psychiatrist office.  We have faxed the referral to the her psychiatrist office.  The patient had not heard from them so we called them to follow-up.  Most recent voicemail we received was that they do not receive a referral and that they were missing additional information before they can accept her as a patient.  We had called our office back and left her contact info and are happy to refax the referral.  We have not received a call back.  I called the patient and let her know this.  It may be easier if she calls her office to see what additional information they are requesting from her.  If they are not able to accept her as a patient, I am happy to refer her elsewhere.  The patient expressed understanding with the instructions.

## 2022-02-20 ENCOUNTER — Other Ambulatory Visit (HOSPITAL_COMMUNITY): Payer: Self-pay

## 2022-02-24 ENCOUNTER — Telehealth: Payer: Self-pay | Admitting: Internal Medicine

## 2022-02-24 NOTE — Telephone Encounter (Signed)
Called patient regarding upcoming November appointment, patient is notified.  

## 2022-02-26 ENCOUNTER — Other Ambulatory Visit (HOSPITAL_COMMUNITY): Payer: Self-pay

## 2022-02-26 NOTE — Progress Notes (Signed)
[facility] OFFICE PROGRESS NOTE  Sarah Freshwater, NP [place] [address]  DIAGNOSIS: Stage IV (T3, N2, M1c) non-small cell lung cancer, adenocarcinoma with ALK gene translocation presented with multiple bilateral pulmonary nodules and masses as well as small mediastinal and bilateral axillary lymphadenopathy and metastatic disease to the brain diagnosed in March 2021.   PRIOR THERAPY: Alecensa (Alectinib) 600 mg p.o. twice daily.  First dose started on July 23 2019.  Status post 29 months of treatment.   CURRENT THERAPY: Lorlatinib 100 mg p.o. daily.  First dose on 12/06/21. Status post 3 month of treatment.    INTERVAL HISTORY: Sarah Chandler 36 y.o. female returns clinic today for a follow-up visit accompanied by her cousin.  The patient was last seen 1 month ago.  The patient is currently undergoing targeted treatment with Lorlatinib.  She is tolerating this fairly well except for mood swings and hyperlipidemia for which she is currently taking Lipitor.  We referred her to her psychiatrist but they are out of network.  The patient was able to connect with the other psychiatry practice. She is going to look into other practices and let me know which office she would like to be referred to.   Otherwise the patient denies any changes in her health since last being seen except she gained more weight and she is getting over a mild upper respiratory infection with sore throat and nasal congestion.. She states that she stress eats.  She also has Synthroid for her thyroid dysfunction for which she sees her endocrinologist once a year.  The patient does admit that she has been missing doses.  Additionally, her current targeted treatment can cause weight gain and dyslipidemia.  Denies any fever, chills, night sweats, or unexplained weight loss.  Denies any chest pain, shortness of breath, cough, or hemoptysis.  Denies any nausea, vomiting, diarrhea, or  constipation.  She sometimes has tension headaches secondary to stress but has not had those recently.  She had a CT scan of the head performed recently which was negative for metastatic disease to the brain.  Denies any rashes or skin changes.  She is here today for evaluation and repeat blood work.   MEDICAL HISTORY: Past Medical History:  Diagnosis Date   nscl ca dx'd 06/2019   Pneumonia 05/08/2019    ALLERGIES:  is allergic to iron.  MEDICATIONS:  Current Outpatient Medications  Medication Sig Dispense Refill   acetaminophen (TYLENOL) 500 MG tablet Take 1,000 mg by mouth every 6 (six) hours as needed for moderate pain or headache.     albuterol (VENTOLIN HFA) 108 (90 Base) MCG/ACT inhaler Inhale 2 puffs into the lungs every 6 (six) hours as needed for wheezing or shortness of breath. 8 g 1   atorvastatin (LIPITOR) 20 MG tablet Take 1 tablet (20 mg total) by mouth daily. 30 tablet 2   ergocalciferol (VITAMIN D2) 1.25 MG (50000 UT) capsule Take 1 capsule (50,000 Units total) by mouth 2 (two) times a week. 26 capsule 3   hydrOXYzine (ATARAX/VISTARIL) 25 MG tablet Take 1 tablet (25 mg total) by mouth 3 (three) times daily as needed for anxiety. **NEEDS APT FOR FURTHER REFILL** 30 tablet 0   levothyroxine (SYNTHROID) 88 MCG tablet Take 1 tablet (88 mcg total) by mouth daily. 90 tablet 3   lorlatinib (LORBRENA) 100 MG tablet Take 1 tablet (100 mg total) by mouth daily. Swallow tablets whole. Do not chew, crush or split  tablets. 30 tablet 2   No current facility-administered medications for this visit.    SURGICAL HISTORY:  Past Surgical History:  Procedure Laterality Date   BIOPSY  07/03/2019   Procedure: BIOPSY;  Surgeon: Rigoberto Noel, MD;  Location: [facility] ENDOSCOPY;  Service: Cardiopulmonary;;   BRONCHIAL BRUSHINGS  07/03/2019   Procedure: BRONCHIAL BRUSHINGS;  Surgeon: Rigoberto Noel, MD;  Location: [facility];  Service: Cardiopulmonary;;   BRONCHIAL WASHINGS  07/03/2019   Procedure:  BRONCHIAL WASHINGS;  Surgeon: Rigoberto Noel, MD;  Location: [facility];  Service: Cardiopulmonary;;   NO PAST SURGERIES     TOOTH EXTRACTION     VIDEO BRONCHOSCOPY N/A 07/03/2019   Procedure: VIDEO BRONCHOSCOPY WITH FLUORO;  Surgeon: Rigoberto Noel, MD;  Location: [facility];  Service: Cardiopulmonary;  Laterality: N/A;  LMA    REVIEW OF SYSTEMS:   Review of Systems  Constitutional: Positive for weight gain.  Negative for appetite change, chills, fatigue, and fever. HENT:   Negative for mouth sores, nosebleeds, sore throat and trouble swallowing.   Eyes: Negative for eye problems and icterus.  Respiratory: Negative for cough, hemoptysis, shortness of breath and wheezing.   Cardiovascular: Negative for chest pain and leg swelling.  Gastrointestinal: Negative for abdominal pain, constipation, diarrhea, nausea and vomiting.  Genitourinary: Negative for bladder incontinence, difficulty urinating, dysuria, frequency and hematuria.   Musculoskeletal: Negative for back pain, gait problem, neck pain and neck stiffness.  Skin: Negative for itching and rash.  Neurological: Negative for dizziness, extremity weakness, gait problem, headaches, light-headedness and seizures.  Hematological: Negative for adenopathy. Does not bruise/bleed easily.  Psychiatric/Behavioral: Negative for confusion, depression and sleep disturbance. The patient is not nervous/anxious.     PHYSICAL EXAMINATION:  Blood pressure 139/85, pulse 65, temperature 99.8 F (37.7 C), temperature source Oral, resp. rate 19, height _0  (1.778 m), weight 294 lb 7 oz (133.6 kg), SpO2 100 %.  ECOG PERFORMANCE STATUS: 1  Physical Exam  Constitutional: Oriented to person, place, and time and well-developed, well-nourished, and in no distress. HENT:  Head: Normocephalic and atraumatic.  Mouth/Throat: Oropharynx is clear and moist. No oropharyngeal exudate.  Eyes: Conjunctivae are normal. Right eye exhibits no discharge. Left eye  exhibits no discharge. No scleral icterus.  Neck: Normal range of motion. Neck supple.  Cardiovascular: Normal rate, regular rhythm, normal heart sounds and intact distal pulses.   Pulmonary/Chest: Effort normal and breath sounds normal. No respiratory distress. No wheezes. No rales.  Abdominal: Soft. Bowel sounds are normal. Exhibits no distension and no mass. There is no tenderness.  Musculoskeletal: Normal range of motion. Exhibits no edema.  Lymphadenopathy:    No cervical adenopathy.  Neurological: Alert and oriented to person, place, and time. Exhibits normal muscle tone. Gait normal. Coordination normal.  Skin: Skin is warm and dry. No rash noted. Not diaphoretic. No erythema. No pallor.  Psychiatric: Mood, memory and judgment normal.  Vitals reviewed.  LABORATORY DATA: Lab Results  Component Value Date   WBC 9.2 03/06/2022   HGB 12.1 03/06/2022   HCT 37.2 03/06/2022   MCV 85.1 03/06/2022   PLT 280 03/06/2022      Chemistry      Component Value Date/Time   NA 140 03/06/2022 1456   NA 143 08/28/2021 0811   K 3.8 03/06/2022 1456   CL 106 03/06/2022 1456   CO2 27 03/06/2022 1456   BUN 10 03/06/2022 1456   BUN 8 08/28/2021 0811   CREATININE 0.69 03/06/2022 1456  Component Value Date/Time   CALCIUM 9.7 03/06/2022 1456   ALKPHOS 66 03/06/2022 1456   AST 13 (L) 03/06/2022 1456   ALT 16 03/06/2022 1456   BILITOT 0.4 03/06/2022 1456       RADIOGRAPHIC STUDIES:  No results found.   ASSESSMENT/PLAN:  This is a pleasant 36 year old caucasian female diagnosed with IV non-small cell lung cancer, adenocarcinoma with positive ALK gene translocation in March 2021.     The patient started treatment with Alecensa on July 23, 2019 and within few days she started not significant improvement in her condition with less shortness of breath as well as decrease in supraclavicular lymphadenopathy.  She is currently on treatment with Alecensa 600 mg p.o. twice daily status post  29 months of treatment.   The patient was lost to follow-up for the last 6 months but she continued to do fine and she was taking her medication with Alecensa (Alectinib) as prescribed.  In August 2023, she had a CT scan showed progression of the diffuse bilateral pulmonary metastasis in addition to progressive diffuse sclerotic bone metastasis. Therefore, Dr. Julien Nordmann recommended discontinuing her treatment with Alecensa (Alectinib).    Therefore, she was started on Lorlatinib 100 mg p.o. daily. She started this on 12/06/21. She has been on this for about 3 month.  She has been tolerating this well except for mood swings.    Labs were reviewed.  Recommend that she continue on the same treatment at the same dose.  We will see her back for follow-up visit in 2 months for evaluation and repeat CT scan.   She will continue taking her Lipitor.  Regarding her weight gain, discussed that her targeted treatment can cause weight gain in addition to her hypothyroidism for which she has been admitted to missing doses, and stress eating.  She is going to research other psychiatrist and I will be happy to place referral to our practice she desired.  She is going to be more diligent about taking her Synthroid.  Regarding her stress eating, hopefully seeing her psychiatrist will help but I did also encourage the patient to be more active and exercise/walk.  Discussed walking is important for her dyslipidemia, weight gain, and also can be helpful with stress reduction/mental health.  The patient was advised to call immediately if she has any concerning symptoms in the interval. The patient voices understanding of current disease status and treatment options and is in agreement with the current care plan. All questions were answered. The patient knows to call the clinic with any problems, questions or concerns. We can certainly see the patient much sooner if necessary        Orders Placed This Encounter   Procedures   CT Chest W Contrast    Standing Status:   Future    Standing Expiration Date:   03/06/2023    Order Specific Question:   If indicated for the ordered procedure, I authorize the administration of contrast media per Radiology protocol    Answer:   Yes    Order Specific Question:   Does the patient have a contrast media/X-ray dye allergy?    Answer:   No    Order Specific Question:   Is patient pregnant?    Answer:   No    Order Specific Question:   Preferred imaging location?    Answer:   [facility]   CT Abdomen Pelvis W Contrast    Standing Status:   Future  Standing Expiration Date:   03/06/2023    Order Specific Question:   If indicated for the ordered procedure, I authorize the administration of contrast media per Radiology protocol    Answer:   Yes    Order Specific Question:   Does the patient have a contrast media/X-ray dye allergy?    Answer:   No    Order Specific Question:   Is patient pregnant?    Answer:   No    Order Specific Question:   Preferred imaging location?    Answer:   [facility]    Order Specific Question:   Is Oral Contrast requested for this exam?    Answer:   Yes, Per Radiology protocol   CBC with Differential ([place] [facility] Only)    Standing Status:   Future    Standing Expiration Date:   03/07/2023   CMP ([facility] only)    Standing Status:   Future    Standing Expiration Date:   03/07/2023   Lipid panel    Standing Status:   Future    Standing Expiration Date:   03/06/2023      The total time spent in the appointment was 20-29 minutes  Massa Pe L Yasser Hepp, PA-C 03/06/22

## 2022-03-05 ENCOUNTER — Other Ambulatory Visit (HOSPITAL_COMMUNITY): Payer: Self-pay

## 2022-03-06 ENCOUNTER — Inpatient Hospital Stay: Payer: Medicaid Other | Attending: Internal Medicine

## 2022-03-06 ENCOUNTER — Inpatient Hospital Stay (HOSPITAL_BASED_OUTPATIENT_CLINIC_OR_DEPARTMENT_OTHER): Payer: Medicaid Other | Admitting: Physician Assistant

## 2022-03-06 ENCOUNTER — Ambulatory Visit: Payer: Medicaid Other | Admitting: Physician Assistant

## 2022-03-06 ENCOUNTER — Other Ambulatory Visit: Payer: Medicaid Other

## 2022-03-06 VITALS — BP 139/85 | HR 65 | Temp 99.8°F | Resp 19 | Ht 70.0 in | Wt 294.4 lb

## 2022-03-06 DIAGNOSIS — Z79899 Other long term (current) drug therapy: Secondary | ICD-10-CM | POA: Diagnosis not present

## 2022-03-06 DIAGNOSIS — C7931 Secondary malignant neoplasm of brain: Secondary | ICD-10-CM | POA: Insufficient documentation

## 2022-03-06 DIAGNOSIS — E785 Hyperlipidemia, unspecified: Secondary | ICD-10-CM | POA: Insufficient documentation

## 2022-03-06 DIAGNOSIS — C7951 Secondary malignant neoplasm of bone: Secondary | ICD-10-CM | POA: Insufficient documentation

## 2022-03-06 DIAGNOSIS — C3492 Malignant neoplasm of unspecified part of left bronchus or lung: Secondary | ICD-10-CM | POA: Insufficient documentation

## 2022-03-06 DIAGNOSIS — C3491 Malignant neoplasm of unspecified part of right bronchus or lung: Secondary | ICD-10-CM | POA: Insufficient documentation

## 2022-03-06 LAB — LIPID PANEL
Cholesterol: 230 mg/dL — ABNORMAL HIGH (ref 0–200)
HDL: 51 mg/dL (ref >40)
LDL Cholesterol: 154 mg/dL — ABNORMAL HIGH (ref 0–99)
Total CHOL/HDL Ratio: 4.5 RATIO
Triglycerides: 124 mg/dL (ref <150)
VLDL: 25 mg/dL (ref 0–40)

## 2022-03-06 LAB — CMP (CANCER CENTER ONLY)
ALT: 16 U/L (ref 0–44)
AST: 13 U/L — ABNORMAL LOW (ref 15–41)
Albumin: 4.2 g/dL (ref 3.5–5.0)
Alkaline Phosphatase: 66 U/L (ref 38–126)
Anion gap: 7 (ref 5–15)
BUN: 10 mg/dL (ref 6–20)
CO2: 27 mmol/L (ref 22–32)
Calcium: 9.7 mg/dL (ref 8.9–10.3)
Chloride: 106 mmol/L (ref 98–111)
Creatinine: 0.69 mg/dL (ref 0.44–1.00)
GFR, Estimated: >60 mL/min (ref >60)
Glucose, Bld: 109 mg/dL — ABNORMAL HIGH (ref 70–99)
Potassium: 3.8 mmol/L (ref 3.5–5.1)
Sodium: 140 mmol/L (ref 135–145)
Total Bilirubin: 0.4 mg/dL (ref 0.3–1.2)
Total Protein: 7.9 g/dL (ref 6.5–8.1)

## 2022-03-06 LAB — CBC WITH DIFFERENTIAL (CANCER CENTER ONLY)
Abs Immature Granulocytes: 0.01 10*3/uL (ref 0.00–0.07)
Basophils Absolute: 0.1 10*3/uL (ref 0.0–0.1)
Basophils Relative: 1 %
Eosinophils Absolute: 0.3 10*3/uL (ref 0.0–0.5)
Eosinophils Relative: 3 %
HCT: 37.2 % (ref 36.0–46.0)
Hemoglobin: 12.1 g/dL (ref 12.0–15.0)
Immature Granulocytes: 0 %
Lymphocytes Relative: 33 %
Lymphs Abs: 3 10*3/uL (ref 0.7–4.0)
MCH: 27.7 pg (ref 26.0–34.0)
MCHC: 32.5 g/dL (ref 30.0–36.0)
MCV: 85.1 fL (ref 80.0–100.0)
Monocytes Absolute: 0.7 10*3/uL (ref 0.1–1.0)
Monocytes Relative: 7 %
Neutro Abs: 5.1 10*3/uL (ref 1.7–7.7)
Neutrophils Relative %: 56 %
Platelet Count: 280 10*3/uL (ref 150–400)
RBC: 4.37 MIL/uL (ref 3.87–5.11)
RDW: 13.1 % (ref 11.5–15.5)
WBC Count: 9.2 10*3/uL (ref 4.0–10.5)
nRBC: 0 % (ref 0.0–0.2)

## 2022-03-15 ENCOUNTER — Other Ambulatory Visit (HOSPITAL_COMMUNITY): Payer: Self-pay

## 2022-03-16 ENCOUNTER — Other Ambulatory Visit (HOSPITAL_COMMUNITY): Payer: Self-pay

## 2022-03-19 ENCOUNTER — Other Ambulatory Visit: Payer: Self-pay

## 2022-03-21 ENCOUNTER — Other Ambulatory Visit (HOSPITAL_COMMUNITY): Payer: Self-pay

## 2022-03-21 ENCOUNTER — Other Ambulatory Visit: Payer: Self-pay

## 2022-03-22 ENCOUNTER — Other Ambulatory Visit: Payer: Self-pay

## 2022-03-22 ENCOUNTER — Other Ambulatory Visit (HOSPITAL_COMMUNITY): Payer: Self-pay

## 2022-04-03 ENCOUNTER — Other Ambulatory Visit: Payer: Self-pay | Admitting: Internal Medicine

## 2022-04-17 ENCOUNTER — Other Ambulatory Visit (HOSPITAL_COMMUNITY): Payer: Self-pay

## 2022-04-18 ENCOUNTER — Other Ambulatory Visit (HOSPITAL_COMMUNITY): Payer: Self-pay

## 2022-04-19 ENCOUNTER — Other Ambulatory Visit (HOSPITAL_COMMUNITY): Payer: Self-pay

## 2022-04-20 ENCOUNTER — Other Ambulatory Visit: Payer: Self-pay

## 2022-04-20 ENCOUNTER — Other Ambulatory Visit (HOSPITAL_COMMUNITY): Payer: Self-pay

## 2022-04-30 ENCOUNTER — Inpatient Hospital Stay: Payer: Medicaid Other | Attending: Internal Medicine

## 2022-04-30 ENCOUNTER — Ambulatory Visit (HOSPITAL_COMMUNITY)
Admission: RE | Admit: 2022-04-30 | Discharge: 2022-04-30 | Disposition: A | Discharge status: Home / Self Care | Payer: Medicaid Other | Source: Ambulatory Visit | Attending: Physician Assistant | Admitting: Physician Assistant

## 2022-04-30 ENCOUNTER — Encounter (HOSPITAL_COMMUNITY): Payer: Self-pay

## 2022-04-30 ENCOUNTER — Other Ambulatory Visit: Payer: Self-pay

## 2022-04-30 DIAGNOSIS — C7951 Secondary malignant neoplasm of bone: Secondary | ICD-10-CM | POA: Diagnosis not present

## 2022-04-30 DIAGNOSIS — E7800 Pure hypercholesterolemia, unspecified: Secondary | ICD-10-CM | POA: Diagnosis not present

## 2022-04-30 DIAGNOSIS — C3492 Malignant neoplasm of unspecified part of left bronchus or lung: Secondary | ICD-10-CM | POA: Insufficient documentation

## 2022-04-30 DIAGNOSIS — C3491 Malignant neoplasm of unspecified part of right bronchus or lung: Secondary | ICD-10-CM | POA: Insufficient documentation

## 2022-04-30 DIAGNOSIS — C7931 Secondary malignant neoplasm of brain: Secondary | ICD-10-CM | POA: Insufficient documentation

## 2022-04-30 DIAGNOSIS — Z79899 Other long term (current) drug therapy: Secondary | ICD-10-CM | POA: Diagnosis not present

## 2022-04-30 LAB — CMP (CANCER CENTER ONLY)
ALT: 14 U/L (ref 0–44)
AST: 15 U/L (ref 15–41)
Albumin: 3.7 g/dL (ref 3.5–5.0)
Alkaline Phosphatase: 54 U/L (ref 38–126)
Anion gap: 8 (ref 5–15)
BUN: 10 mg/dL (ref 6–20)
CO2: 24 mmol/L (ref 22–32)
Calcium: 9.4 mg/dL (ref 8.9–10.3)
Chloride: 107 mmol/L (ref 98–111)
Creatinine: 0.69 mg/dL (ref 0.44–1.00)
GFR, Estimated: >60 mL/min (ref >60)
Glucose, Bld: 87 mg/dL (ref 70–99)
Potassium: 3.7 mmol/L (ref 3.5–5.1)
Sodium: 139 mmol/L (ref 135–145)
Total Bilirubin: 0.4 mg/dL (ref 0.3–1.2)
Total Protein: 7.3 g/dL (ref 6.5–8.1)

## 2022-04-30 LAB — LIPID PANEL
Cholesterol: 215 mg/dL — ABNORMAL HIGH (ref 0–200)
HDL: 48 mg/dL (ref >40)
LDL Cholesterol: 139 mg/dL — ABNORMAL HIGH (ref 0–99)
Total CHOL/HDL Ratio: 4.5 RATIO
Triglycerides: 138 mg/dL (ref <150)
VLDL: 28 mg/dL (ref 0–40)

## 2022-04-30 LAB — CBC WITH DIFFERENTIAL (CANCER CENTER ONLY)
Abs Immature Granulocytes: 0.03 10*3/uL (ref 0.00–0.07)
Basophils Absolute: 0.1 10*3/uL (ref 0.0–0.1)
Basophils Relative: 1 %
Eosinophils Absolute: 0.4 10*3/uL (ref 0.0–0.5)
Eosinophils Relative: 4 %
HCT: 37 % (ref 36.0–46.0)
Hemoglobin: 12.3 g/dL (ref 12.0–15.0)
Immature Granulocytes: 0 %
Lymphocytes Relative: 28 %
Lymphs Abs: 3.2 10*3/uL (ref 0.7–4.0)
MCH: 27.4 pg (ref 26.0–34.0)
MCHC: 33.2 g/dL (ref 30.0–36.0)
MCV: 82.4 fL (ref 80.0–100.0)
Monocytes Absolute: 1 10*3/uL (ref 0.1–1.0)
Monocytes Relative: 9 %
Neutro Abs: 6.8 10*3/uL (ref 1.7–7.7)
Neutrophils Relative %: 58 %
Platelet Count: 323 10*3/uL (ref 150–400)
RBC: 4.49 MIL/uL (ref 3.87–5.11)
RDW: 13.9 % (ref 11.5–15.5)
WBC Count: 11.5 10*3/uL — ABNORMAL HIGH (ref 4.0–10.5)
nRBC: 0 % (ref 0.0–0.2)

## 2022-04-30 MED ORDER — IOHEXOL 300 MG/ML  SOLN
100.0000 mL | Freq: Once | INTRAMUSCULAR | Status: AC | PRN
  Administered 2022-04-30: 100 mL via INTRAVENOUS

## 2022-04-30 MED ORDER — SODIUM CHLORIDE (PF) 0.9 % IJ SOLN
INTRAMUSCULAR | Status: AC
  Filled 2022-04-30: qty 50

## 2022-05-02 ENCOUNTER — Inpatient Hospital Stay (HOSPITAL_BASED_OUTPATIENT_CLINIC_OR_DEPARTMENT_OTHER): Payer: Medicaid Other | Admitting: Internal Medicine

## 2022-05-02 ENCOUNTER — Ambulatory Visit: Payer: Medicaid Other | Admitting: Internal Medicine

## 2022-05-02 VITALS — BP 110/48 | HR 108 | Temp 98.3°F | Resp 17 | Wt 290.3 lb

## 2022-05-02 DIAGNOSIS — C3491 Malignant neoplasm of unspecified part of right bronchus or lung: Secondary | ICD-10-CM | POA: Diagnosis not present

## 2022-05-02 NOTE — Progress Notes (Signed)
[facility] Telephone:([phone]) [phone]   Fax:([phone]) [phone]  OFFICE PROGRESS NOTE  Ronnell Freshwater, NP [place] [address]  DIAGNOSIS: Stage IV (T3, N2, M1c) non-small cell lung cancer, adenocarcinoma with ALK gene translocation presented with multiple bilateral pulmonary nodules and masses as well as small mediastinal and bilateral axillary lymphadenopathy and metastatic disease to the brain diagnosed in March 2021.  PRIOR THERAPY: Alecensa (Alectinib) 600 mg p.o. twice daily.  First dose started on July 23 2019.  Status post 29 months of treatment.  CURRENT THERAPY: Lorlatinib 100 mg p.o. daily.  First dose was December 06, 2021 status post 5 months of treatment.  INTERVAL HISTORY: Sarah Chandler 37 y.o. female returns to the clinic today for follow-up visit accompanied by her cousin Regis Bill.  The patient is feeling fine today with no concerning complaints.  She denied having any current chest pain, shortness of breath, cough or hemoptysis.  She has no nausea, vomiting, diarrhea or constipation.  She has no headache or visual changes.  She has no recent fever or chills.  She lost few pounds on her own.  She continues to tolerate her treatment with Lorlatinib fairly well.  She is here today for evaluation with repeat blood work as well as CT scan of the chest, abdomen and pelvis for restaging of her disease.   MEDICAL HISTORY: Past Medical History:  Diagnosis Date   nscl ca dx'd 06/2019   Pneumonia 05/08/2019    ALLERGIES:  is allergic to iron.  MEDICATIONS:  Current Outpatient Medications  Medication Sig Dispense Refill   acetaminophen (TYLENOL) 500 MG tablet Take 1,000 mg by mouth every 6 (six) hours as needed for moderate pain or headache.     albuterol (VENTOLIN HFA) 108 (90 Base) MCG/ACT inhaler Inhale 2 puffs into the lungs every 6 (six) hours as needed for wheezing or shortness of breath. 8 g 1   atorvastatin (LIPITOR) 20 MG tablet  TAKE 1 TABLET BY MOUTH EVERY DAY 30 tablet 2   ergocalciferol (VITAMIN D2) 1.25 MG (50000 UT) capsule Take 1 capsule (50,000 Units total) by mouth 2 (two) times a week. 26 capsule 3   hydrOXYzine (ATARAX/VISTARIL) 25 MG tablet Take 1 tablet (25 mg total) by mouth 3 (three) times daily as needed for anxiety. **NEEDS APT FOR FURTHER REFILL** 30 tablet 0   levothyroxine (SYNTHROID) 88 MCG tablet Take 1 tablet (88 mcg total) by mouth daily. 90 tablet 3   lorlatinib (LORBRENA) 100 MG tablet Take 1 tablet (100 mg total) by mouth daily. Swallow tablets whole. Do not chew, crush or split tablets. 30 tablet 2   No current facility-administered medications for this visit.    SURGICAL HISTORY:  Past Surgical History:  Procedure Laterality Date   BIOPSY  07/03/2019   Procedure: BIOPSY;  Surgeon: Rigoberto Noel, MD;  Location: [facility] ENDOSCOPY;  Service: Cardiopulmonary;;   BRONCHIAL BRUSHINGS  07/03/2019   Procedure: BRONCHIAL BRUSHINGS;  Surgeon: Rigoberto Noel, MD;  Location: [place];  Service: Cardiopulmonary;;   BRONCHIAL WASHINGS  07/03/2019   Procedure: BRONCHIAL WASHINGS;  Surgeon: Rigoberto Noel, MD;  Location: [place];  Service: Cardiopulmonary;;   NO PAST SURGERIES     TOOTH EXTRACTION     VIDEO BRONCHOSCOPY N/A 07/03/2019   Procedure: VIDEO BRONCHOSCOPY WITH FLUORO;  Surgeon: Rigoberto Noel, MD;  Location: [place];  Service: Cardiopulmonary;  Laterality: N/A;  LMA    REVIEW OF SYSTEMS:  Constitutional: negative Eyes: negative Ears, nose, mouth, throat, and face: negative Respiratory: negative Cardiovascular: negative Gastrointestinal: negative Genitourinary:negative Integument/breast: negative Hematologic/lymphatic: negative Musculoskeletal:negative Neurological: negative Behavioral/Psych: negative Endocrine: negative Allergic/Immunologic: negative   PHYSICAL EXAMINATION: General appearance: alert, cooperative, and no distress Head: Normocephalic, without obvious  abnormality, atraumatic Neck: no adenopathy, no JVD, supple, symmetrical, trachea midline, and thyroid not enlarged, symmetric, no tenderness/mass/nodules Lymph nodes: Cervical, supraclavicular, and axillary nodes normal. Resp: clear to auscultation bilaterally Back: symmetric, no curvature. ROM normal. No CVA tenderness. Cardio: regular rate and rhythm, S1, S2 normal, no murmur, click, rub or gallop GI: soft, non-tender; bowel sounds normal; no masses,  no organomegaly Extremities: extremities normal, atraumatic, no cyanosis or edema Neurologic: Alert and oriented X 3, normal strength and tone. Normal symmetric reflexes. Normal coordination and gait  ECOG PERFORMANCE STATUS: 1 - Symptomatic but completely ambulatory  Blood pressure (!) 110/48, pulse (!) 108, temperature 98.3 F (36.8 C), temperature source Oral, resp. rate 17, weight 290 lb 5 oz (131.7 kg), SpO2 98 %.  LABORATORY DATA: Lab Results  Component Value Date   WBC 11.5 (H) 04/30/2022   HGB 12.3 04/30/2022   HCT 37.0 04/30/2022   MCV 82.4 04/30/2022   PLT 323 04/30/2022      Chemistry      Component Value Date/Time   NA 139 04/30/2022 0913   NA 143 08/28/2021 0811   K 3.7 04/30/2022 0913   CL 107 04/30/2022 0913   CO2 24 04/30/2022 0913   BUN 10 04/30/2022 0913   BUN 8 08/28/2021 0811   CREATININE 0.69 04/30/2022 0913      Component Value Date/Time   CALCIUM 9.4 04/30/2022 0913   ALKPHOS 54 04/30/2022 0913   AST 15 04/30/2022 0913   ALT 14 04/30/2022 0913   BILITOT 0.4 04/30/2022 0913       RADIOGRAPHIC STUDIES: CT Chest W Contrast  Result Date: 04/30/2022 CLINICAL DATA:  Non-small-cell lung cancer. Assess treatment response. Primary adenocarcinoma of the right lung. * Tracking Code: BO * EXAM: CT CHEST, ABDOMEN, AND PELVIS WITH CONTRAST TECHNIQUE: Multidetector CT imaging of the chest, abdomen and pelvis was performed following the standard protocol during bolus administration of intravenous contrast.  RADIATION DOSE REDUCTION: This exam was performed according to the departmental dose-optimization program which includes automated exposure control, adjustment of the mA and/or kV according to patient size and/or use of iterative reconstruction technique. CONTRAST:  187mL OMNIPAQUE IOHEXOL 300 MG/ML  SOLN COMPARISON:  CT 01/23/2022 and older FINDINGS: CT CHEST FINDINGS Cardiovascular: Normal caliber thoracic aorta. Heart is nonenlarged. No pericardial effusion. Mediastinum/Nodes: Slightly heterogeneous thyroid gland. No specific abnormal lymph node enlargement seen in the axillary region, hilum or mediastinum. Normal caliber thoracic esophagus. Lungs/Pleura: Lung windows are without consolidation, pneumothorax or effusion. There is breathing motion seen throughout the examination. There are scattered diffuse areas of interstitial septal thickening consistent with scarring and fibrotic changes. There is some areas of thickening as well along the interlobar fissure of the left lung and both fissures of the right lung. Once again there are numerous small pulmonary nodules identified. Specific lesions will be followed for continuity. Specifically the area immediately left upper lobe abutting the mediastinum on the prior which had measured 8 x 7 mm, today measures 7 by 6 mm on series 506, image 61. Other areas of nodular continue to improve and poorly defined today. On this study of August 2023 there innumerable sub 5 mm nodules scattered in both lungs except for the larger more dominant  focus in the medial left upper lobe. There is residual punctate nodularity scattered in the lungs today. Musculoskeletal: Curvature and degenerative changes along the spine. There are multiple sclerotic small foci scattered in the skeleton including sternum, clavicles, scapula, ribs and spine. CT ABDOMEN PELVIS FINDINGS Hepatobiliary: No space-occupying liver lesion. Patent portal vein. Gallbladder is nondilated. Pancreas: Preserved  pancreatic parenchyma without mass lesion or ductal dilatation. Spleen: Splenic granuloma.  Spleen is nonenlarged. Adrenals/Urinary Tract: Adrenal glands are preserved. No enhancing renal mass or collecting system filling defect. The ureters have normal course and caliber down to the bladder. Preserved contours of the urinary bladder. Stomach/Bowel: Large bowel has a normal course and caliber with scattered colonic stool. Few sigmoid colon diverticula. Stomach is nondilated. Small bowel is nondilated. Normal appendix. Vascular/Lymphatic: Normal caliber aorta and IVC. No developing abnormal lymph node enlargement seen in the abdomen and pelvis. Reproductive: Uterus and bilateral adnexa are unremarkable. Small cystic area in the right adnexa, likely ovarian on image 105 of series 504 measuring 16 mm. No specific imaging follow-up based on patient's age and appearance. Other: No abdominal wall hernia or abnormality. No abdominopelvic ascites. Minimal fat containing umbilical hernia. Musculoskeletal: Extensive scattered punctate sclerotic foci throughout the lumbar spine and pelvis consistent with known osseous metastatic disease. Also some involvement of the femurs. IMPRESSION: Further reduction in scattered tiny lung nodules with trace residual. No new developing mass lesion or lymph node enlargement or fluid collection in the chest, abdomen and pelvis. Extensive punctate sclerotic bone metastases diffusely. Electronically Signed   By: Jill Side M.D.   On: 04/30/2022 11:35   CT Abdomen Pelvis W Contrast  Result Date: 04/30/2022 CLINICAL DATA:  Non-small-cell lung cancer. Assess treatment response. Primary adenocarcinoma of the right lung. * Tracking Code: BO * EXAM: CT CHEST, ABDOMEN, AND PELVIS WITH CONTRAST TECHNIQUE: Multidetector CT imaging of the chest, abdomen and pelvis was performed following the standard protocol during bolus administration of intravenous contrast. RADIATION DOSE REDUCTION: This exam  was performed according to the departmental dose-optimization program which includes automated exposure control, adjustment of the mA and/or kV according to patient size and/or use of iterative reconstruction technique. CONTRAST:  127mL OMNIPAQUE IOHEXOL 300 MG/ML  SOLN COMPARISON:  CT 01/23/2022 and older FINDINGS: CT CHEST FINDINGS Cardiovascular: Normal caliber thoracic aorta. Heart is nonenlarged. No pericardial effusion. Mediastinum/Nodes: Slightly heterogeneous thyroid gland. No specific abnormal lymph node enlargement seen in the axillary region, hilum or mediastinum. Normal caliber thoracic esophagus. Lungs/Pleura: Lung windows are without consolidation, pneumothorax or effusion. There is breathing motion seen throughout the examination. There are scattered diffuse areas of interstitial septal thickening consistent with scarring and fibrotic changes. There is some areas of thickening as well along the interlobar fissure of the left lung and both fissures of the right lung. Once again there are numerous small pulmonary nodules identified. Specific lesions will be followed for continuity. Specifically the area immediately left upper lobe abutting the mediastinum on the prior which had measured 8 x 7 mm, today measures 7 by 6 mm on series 506, image 61. Other areas of nodular continue to improve and poorly defined today. On this study of August 2023 there innumerable sub 5 mm nodules scattered in both lungs except for the larger more dominant focus in the medial left upper lobe. There is residual punctate nodularity scattered in the lungs today. Musculoskeletal: Curvature and degenerative changes along the spine. There are multiple sclerotic small foci scattered in the skeleton including sternum, clavicles, scapula, ribs and spine.  CT ABDOMEN PELVIS FINDINGS Hepatobiliary: No space-occupying liver lesion. Patent portal vein. Gallbladder is nondilated. Pancreas: Preserved pancreatic parenchyma without mass  lesion or ductal dilatation. Spleen: Splenic granuloma.  Spleen is nonenlarged. Adrenals/Urinary Tract: Adrenal glands are preserved. No enhancing renal mass or collecting system filling defect. The ureters have normal course and caliber down to the bladder. Preserved contours of the urinary bladder. Stomach/Bowel: Large bowel has a normal course and caliber with scattered colonic stool. Few sigmoid colon diverticula. Stomach is nondilated. Small bowel is nondilated. Normal appendix. Vascular/Lymphatic: Normal caliber aorta and IVC. No developing abnormal lymph node enlargement seen in the abdomen and pelvis. Reproductive: Uterus and bilateral adnexa are unremarkable. Small cystic area in the right adnexa, likely ovarian on image 105 of series 504 measuring 16 mm. No specific imaging follow-up based on patient's age and appearance. Other: No abdominal wall hernia or abnormality. No abdominopelvic ascites. Minimal fat containing umbilical hernia. Musculoskeletal: Extensive scattered punctate sclerotic foci throughout the lumbar spine and pelvis consistent with known osseous metastatic disease. Also some involvement of the femurs. IMPRESSION: Further reduction in scattered tiny lung nodules with trace residual. No new developing mass lesion or lymph node enlargement or fluid collection in the chest, abdomen and pelvis. Extensive punctate sclerotic bone metastases diffusely. Electronically Signed   By: Jill Side M.D.   On: 04/30/2022 11:35     ASSESSMENT AND PLAN: This is a very pleasant 37 years old white female recently diagnosed with stage IV non-small cell lung cancer, adenocarcinoma with positive ALK gene translocation in March 2021.  The patient started treatment with Alecensa on July 23, 2019 and within few days she started not significant improvement in her condition with less shortness of breath as well as decrease in supraclavicular lymphadenopathy.  She is currently on treatment with Alecensa 600 mg  p.o. twice daily status post 29 months of treatment. The patient was lost to follow-up for the last 6 months but she continues to do fine and she is taking her medication with Alecensa (Alectinib) as prescribed.  Her treatment was discontinued secondary to disease progression. The patient has started treatment with Lorlatinib 100 mg p.o. daily status post 5 months of treatment. The patient has been tolerating this treatment well with no concerning adverse effects. She had repeat CT scan of the chest, abdomen and pelvis performed recently.  I personally and independently reviewed the scans and discussed results with the patient and her cousin today. Her scan showed further reduction in the scattered tiny lung nodules and no evidence for progression. I recommended for her to continue her current treatment with Lorlatinib with the same dose. I will see her back for follow-up visit in 2 months for evaluation and repeat blood work. For the drug-induced hypercholesterolemia, she will continue her current treatment with Lipitor. She was advised to call immediately if she has any concerning symptoms in the interval.  The patient voices understanding of current disease status and treatment options and is in agreement with the current care plan.  All questions were answered. The patient knows to call the clinic with any problems, questions or concerns. We can certainly see the patient much sooner if necessary. The total time spent in the appointment was 30 minutes.  Disclaimer: This note was dictated with voice recognition software. Similar sounding words can inadvertently be transcribed and may not be corrected upon review.

## 2022-05-08 ENCOUNTER — Other Ambulatory Visit: Payer: Medicaid Other

## 2022-05-10 ENCOUNTER — Other Ambulatory Visit: Payer: Self-pay | Admitting: Physician Assistant

## 2022-05-10 ENCOUNTER — Other Ambulatory Visit (HOSPITAL_COMMUNITY): Payer: Self-pay

## 2022-05-10 DIAGNOSIS — C3491 Malignant neoplasm of unspecified part of right bronchus or lung: Secondary | ICD-10-CM

## 2022-05-10 MED ORDER — LORLATINIB 100 MG PO TABS
100.0000 mg | ORAL_TABLET | Freq: Every day | ORAL | 2 refills | Status: DC
  Filled 2022-05-10: qty 30, 30d supply, fill #0
  Filled 2022-06-05: qty 30, 30d supply, fill #1
  Filled 2022-07-03: qty 30, 30d supply, fill #2

## 2022-05-15 ENCOUNTER — Other Ambulatory Visit: Payer: Self-pay

## 2022-05-15 ENCOUNTER — Other Ambulatory Visit (HOSPITAL_COMMUNITY): Payer: Self-pay

## 2022-05-16 ENCOUNTER — Other Ambulatory Visit: Payer: Self-pay

## 2022-06-01 ENCOUNTER — Telehealth: Payer: Self-pay | Admitting: Internal Medicine

## 2022-06-01 NOTE — Telephone Encounter (Signed)
Called patient regarding upcoming March appointments, patient is notified. 

## 2022-06-03 NOTE — Progress Notes (Unsigned)
Complete physical exam   Patient: Sarah Chandler   DOB: Oct 25, 1985   37 y.o. Female  MRN: [id number] Visit Date: 06/04/2022    No chief complaint on file.  Subjective    Sarah Chandler is a 37 y.o. female who presents today for a complete physical exam.  She reports consuming a {diet types:17450} diet. {Exercise:19826} She generally feels {well/fairly well/poorly:18703}. She {does/does not:200015} have additional problems to discuss today.   HPI  Annual physical  -ongoing treatment for primary adenocarcinoma of right  lung.  -?GYN provider  -routine, fasting labs done prior to today -mild elevation of WBC -mild elevation of LDL and total cholesterol.   Past Medical History:  Diagnosis Date   nscl ca dx'd 06/2019   Pneumonia 05/08/2019   Past Surgical History:  Procedure Laterality Date   BIOPSY  07/03/2019   Procedure: BIOPSY;  Surgeon: Rigoberto Noel, MD;  Location[facility] ENDOSCOPY;  Service: Cardiopulmonary;;   BRONCHIAL BRUSHINGS  07/03/2019   Procedure: BRONCHIAL BRUSHINGS;  Surgeon: Rigoberto Noel, MD;  Location: [place];  Service: Cardiopulmonary;;   BRONCHIAL WASHINGS  07/03/2019   Procedure: BRONCHIAL WASHINGS;  Surgeon: Rigoberto Noel, MD;  Location: [place];  Service: Cardiopulmonary;;   NO PAST SURGERIES     TOOTH EXTRACTION     VIDEO BRONCHOSCOPY N/A 07/03/2019   Procedure: VIDEO BRONCHOSCOPY WITH FLUORO;  Surgeon: Rigoberto Noel, MD;  Location: [place];  Service: Cardiopulmonary;  Laterality: N/A;  LMA   Social History   Socioeconomic History   Marital status: Single    Spouse name: Not on file   Number of children: Not on file   Years of education: Not on file   Highest education level: Not on file  Occupational History   Not on file  Tobacco Use   Smoking status: Never   Smokeless tobacco: Never  Vaping Use   Vaping Use: Never used  Substance and Sexual Activity   Alcohol use: Yes   Drug use: Never   Sexual activity: Not Currently   Other Topics Concern   Not on file  Social History Narrative   Not on file   Social Determinants of Health   Financial Resource Strain: Not on file  Food Insecurity: Not on file  Transportation Needs: Not on file  Physical Activity: Not on file  Stress: Not on file  Social Connections: Not on file  Intimate Partner Violence: Not on file   Family Status  Relation Name Status   Father  (Not Specified)   Mat Uncle  Deceased       Suicide   MGM  (Not Specified)   PGF  (Not Specified)   Family History  Problem Relation Age of Onset   Diabetes Father    Hyperlipidemia Father    Hypertension Father    Depression Maternal Uncle    Diabetes Maternal Uncle    Cancer Maternal Grandmother    Hyperlipidemia Maternal Grandmother    Cancer Paternal Grandfather    Allergies  Allergen Reactions   Iron Other (See Comments)    Ferrosol - Liquid  Unknown reaction     Patient Care Team: Ronnell Freshwater, NP as PCP - General (Family Medicine) Valrie Hart, RN as Oncology Nurse Navigator   Medications: Outpatient Medications Prior to Visit  Medication Sig   acetaminophen (TYLENOL) 500 MG tablet Take 1,000 mg by mouth every 6 (six) hours as needed for moderate pain or headache.   albuterol (VENTOLIN HFA) 108 (  90 Base) MCG/ACT inhaler Inhale 2 puffs into the lungs every 6 (six) hours as needed for wheezing or shortness of breath.   atorvastatin (LIPITOR) 20 MG tablet TAKE 1 TABLET BY MOUTH EVERY DAY   ergocalciferol (VITAMIN D2) 1.25 MG (50000 UT) capsule Take 1 capsule (50,000 Units total) by mouth 2 (two) times a week.   hydrOXYzine (ATARAX/VISTARIL) 25 MG tablet Take 1 tablet (25 mg total) by mouth 3 (three) times daily as needed for anxiety. **NEEDS APT FOR FURTHER REFILL**   levothyroxine (SYNTHROID) 88 MCG tablet Take 1 tablet (88 mcg total) by mouth daily.   lorlatinib (LORBRENA) 100 MG tablet Take 1 tablet (100 mg total) by mouth daily. Swallow tablets whole. Do not chew,  crush or split tablets.   No facility-administered medications prior to visit.    Review of Systems  {Labs (Optional):23779}   Objective    There were no vitals filed for this visit. There is no height or weight on file to calculate BMI.  BP Readings from Last 3 Encounters:  05/02/22 (Abnormal) 110/48  03/06/22 139/85  02/06/22 (Abnormal) 142/68    Wt Readings from Last 3 Encounters:  05/02/22 290 lb 5 oz (131.7 kg)  03/06/22 294 lb 7 oz (133.6 kg)  02/06/22 288 lb 11.2 oz (131 kg)     Physical Exam  ***  Last depression screening scores   Row Labels 08/02/2021    1:19 PM 05/11/2021   10:15 AM 05/19/2019   10:17 AM  PHQ 2/9 Scores   Section Header. No data exists in this row.     PHQ - 2 Score   '1 2 1  '$ PHQ- 9 Score   '4 10 6   '$ Last fall risk screening   Row Labels 05/11/2021   10:15 AM  Welcome. No data exists in this row.   Falls in the past year?   0  Number falls in past yr:   0  Injury with Fall?   0  Follow up   Falls evaluation completed   Last Audit-C alcohol use screening   No data to display    A score of 3 or more in women, and 4 or more in men indicates increased risk for alcohol abuse, EXCEPT if all of the points are from question 1   No results found for any visits on 06/04/22.  Assessment & Plan    Routine Health Maintenance and Physical Exam  Exercise Activities and Dietary recommendations  Goals   None     Immunization History  Administered Date(s) Administered   Influenza,inj,Quad PF,6+ Mos 05/09/2019, 01/23/2022    Health Maintenance  Topic Date Due   COVID-19 Vaccine (1) Never done   Hepatitis C Screening  Never done   DTaP/Tdap/Td (1 - Tdap) Never done   PAP SMEAR-Modifier  Never done   INFLUENZA VACCINE  Completed   HIV Screening  Completed   HPV VACCINES  Aged Out    Discussed health benefits of physical activity, and encouraged her to engage in regular exercise appropriate for her age and  condition.  Problem List Items Addressed This Visit   None    No follow-ups on file.        Ronnell Freshwater, NP  [facility] [phone] (phone) [phone] (fax)  [place]

## 2022-06-04 ENCOUNTER — Ambulatory Visit (INDEPENDENT_AMBULATORY_CARE_PROVIDER_SITE_OTHER): Payer: Medicaid Other | Admitting: Nurse Practitioner

## 2022-06-04 ENCOUNTER — Encounter: Payer: Self-pay | Admitting: Nurse Practitioner

## 2022-06-04 VITALS — BP 139/82 | HR 93 | Ht 70.0 in | Wt 292.1 lb

## 2022-06-04 DIAGNOSIS — E039 Hypothyroidism, unspecified: Secondary | ICD-10-CM

## 2022-06-04 DIAGNOSIS — F321 Major depressive disorder, single episode, moderate: Secondary | ICD-10-CM

## 2022-06-04 DIAGNOSIS — Z0001 Encounter for general adult medical examination with abnormal findings: Secondary | ICD-10-CM | POA: Diagnosis not present

## 2022-06-04 DIAGNOSIS — E785 Hyperlipidemia, unspecified: Secondary | ICD-10-CM | POA: Diagnosis not present

## 2022-06-04 DIAGNOSIS — C3491 Malignant neoplasm of unspecified part of right bronchus or lung: Secondary | ICD-10-CM | POA: Diagnosis not present

## 2022-06-04 DIAGNOSIS — Z6841 Body Mass Index (BMI) 40.0 and over, adult: Secondary | ICD-10-CM

## 2022-06-04 MED ORDER — CITALOPRAM HYDROBROMIDE 10 MG PO TABS: 10.0000 mg | ORAL_TABLET | Freq: Every day | ORAL | 1 refills | Status: DC

## 2022-06-05 ENCOUNTER — Other Ambulatory Visit (HOSPITAL_COMMUNITY): Payer: Self-pay

## 2022-06-06 ENCOUNTER — Other Ambulatory Visit: Payer: Self-pay

## 2022-06-07 ENCOUNTER — Encounter: Payer: Self-pay | Admitting: Internal Medicine

## 2022-06-07 ENCOUNTER — Other Ambulatory Visit (HOSPITAL_COMMUNITY): Payer: Self-pay

## 2022-06-08 ENCOUNTER — Other Ambulatory Visit (HOSPITAL_COMMUNITY): Payer: Self-pay

## 2022-06-28 ENCOUNTER — Inpatient Hospital Stay (HOSPITAL_BASED_OUTPATIENT_CLINIC_OR_DEPARTMENT_OTHER): Payer: Medicaid Other | Admitting: Internal Medicine

## 2022-06-28 ENCOUNTER — Other Ambulatory Visit: Payer: Self-pay | Admitting: Internal Medicine

## 2022-06-28 ENCOUNTER — Inpatient Hospital Stay: Payer: Medicaid Other | Attending: Internal Medicine

## 2022-06-28 ENCOUNTER — Other Ambulatory Visit: Payer: Self-pay

## 2022-06-28 VITALS — BP 152/84 | HR 71 | Temp 98.4°F | Resp 18 | Wt 299.2 lb

## 2022-06-28 DIAGNOSIS — Z79899 Other long term (current) drug therapy: Secondary | ICD-10-CM | POA: Diagnosis not present

## 2022-06-28 DIAGNOSIS — C7931 Secondary malignant neoplasm of brain: Secondary | ICD-10-CM | POA: Insufficient documentation

## 2022-06-28 DIAGNOSIS — C3490 Malignant neoplasm of unspecified part of unspecified bronchus or lung: Secondary | ICD-10-CM | POA: Diagnosis not present

## 2022-06-28 DIAGNOSIS — C3491 Malignant neoplasm of unspecified part of right bronchus or lung: Secondary | ICD-10-CM | POA: Diagnosis present

## 2022-06-28 DIAGNOSIS — C3492 Malignant neoplasm of unspecified part of left bronchus or lung: Secondary | ICD-10-CM | POA: Insufficient documentation

## 2022-06-28 LAB — CMP (CANCER CENTER ONLY)
ALT: 24 U/L (ref 0–44)
AST: 18 U/L (ref 15–41)
Albumin: 4.2 g/dL (ref 3.5–5.0)
Alkaline Phosphatase: 54 U/L (ref 38–126)
Anion gap: 8 (ref 5–15)
BUN: 7 mg/dL (ref 6–20)
CO2: 23 mmol/L (ref 22–32)
Calcium: 9.2 mg/dL (ref 8.9–10.3)
Chloride: 108 mmol/L (ref 98–111)
Creatinine: 0.6 mg/dL (ref 0.44–1.00)
GFR, Estimated: >60 mL/min (ref >60)
Glucose, Bld: 93 mg/dL (ref 70–99)
Potassium: 4 mmol/L (ref 3.5–5.1)
Sodium: 139 mmol/L (ref 135–145)
Total Bilirubin: 0.3 mg/dL (ref 0.3–1.2)
Total Protein: 7.4 g/dL (ref 6.5–8.1)

## 2022-06-28 LAB — CBC WITH DIFFERENTIAL (CANCER CENTER ONLY)
Abs Immature Granulocytes: 0.02 10*3/uL (ref 0.00–0.07)
Basophils Absolute: 0.1 10*3/uL (ref 0.0–0.1)
Basophils Relative: 1 %
Eosinophils Absolute: 0.4 10*3/uL (ref 0.0–0.5)
Eosinophils Relative: 4 %
HCT: 38.1 % (ref 36.0–46.0)
Hemoglobin: 12.7 g/dL (ref 12.0–15.0)
Immature Granulocytes: 0 %
Lymphocytes Relative: 38 %
Lymphs Abs: 3.3 10*3/uL (ref 0.7–4.0)
MCH: 27.9 pg (ref 26.0–34.0)
MCHC: 33.3 g/dL (ref 30.0–36.0)
MCV: 83.7 fL (ref 80.0–100.0)
Monocytes Absolute: 0.6 10*3/uL (ref 0.1–1.0)
Monocytes Relative: 7 %
Neutro Abs: 4.4 10*3/uL (ref 1.7–7.7)
Neutrophils Relative %: 50 %
Platelet Count: 255 10*3/uL (ref 150–400)
RBC: 4.55 MIL/uL (ref 3.87–5.11)
RDW: 14 % (ref 11.5–15.5)
WBC Count: 8.8 10*3/uL (ref 4.0–10.5)
nRBC: 0 % (ref 0.0–0.2)

## 2022-06-28 LAB — LIPID PANEL
Cholesterol: 246 mg/dL — ABNORMAL HIGH (ref 0–200)
HDL: 68 mg/dL (ref >40)
LDL Cholesterol: 153 mg/dL — ABNORMAL HIGH (ref 0–99)
Total CHOL/HDL Ratio: 3.6 RATIO
Triglycerides: 124 mg/dL (ref <150)
VLDL: 25 mg/dL (ref 0–40)

## 2022-06-28 MED ORDER — ATORVASTATIN CALCIUM 40 MG PO TABS: 20.0000 mg | ORAL_TABLET | Freq: Every day | ORAL | 1 refills | Status: DC

## 2022-06-28 NOTE — Progress Notes (Signed)
[facility] [phone]   [phone]  OFFICE PROGRESS NOTE  Ronnell Freshwater, NP [place] [address]  DIAGNOSIS: Stage IV (T3, N2, M1c) non-small cell lung cancer, adenocarcinoma with ALK gene translocation presented with multiple bilateral pulmonary nodules and masses as well as small mediastinal and bilateral axillary lymphadenopathy and metastatic disease to the brain diagnosed in March 2021.  PRIOR THERAPY: Alecensa (Alectinib) 600 mg p.o. twice daily.  First dose started on July 23 2019.  Status post 29 months of treatment.  CURRENT THERAPY: Lorlatinib 100 mg p.o. daily.  First dose was December 06, 2021 status post 5 months of treatment.  INTERVAL HISTORY: Sarah [place] 37 y.o. female returns to the clinic today for follow-up visit.  The patient is feeling fine today with no concerning complaints.  She started treatment with Celexa by her primary care provider but she did not notice a difference yet.  She denied having any chest pain, shortness of breath, cough or hemoptysis.  She has no nausea, vomiting, diarrhea or constipation.  She has no headache or visual changes.  She has no recent weight loss or night sweats.  She actually gained around 7 pounds since her last visit.  She continues to tolerate her treatment with Lorlatinib fairly well.  She is here today for evaluation and repeat blood work.   MEDICAL HISTORY: Past Medical History:  Diagnosis Date   nscl ca dx'd 06/2019   Pneumonia 05/08/2019    ALLERGIES:  is allergic to iron.  MEDICATIONS:  Current Outpatient Medications  Medication Sig Dispense Refill   acetaminophen (TYLENOL) 500 MG tablet Take 1,000 mg by mouth every 6 (six) hours as needed for moderate pain or headache.     albuterol (VENTOLIN HFA) 108 (90 Base) MCG/ACT inhaler Inhale 2 puffs into the lungs every 6 (six) hours as needed for wheezing or shortness of breath. 8 g 1   atorvastatin  (LIPITOR) 20 MG tablet TAKE 1 TABLET BY MOUTH EVERY DAY 30 tablet 2   citalopram (CELEXA) 10 MG tablet Take 1 tablet (10 mg total) by mouth daily. 90 tablet 1   ergocalciferol (VITAMIN D2) 1.25 MG (50000 UT) capsule Take 1 capsule (50,000 Units total) by mouth 2 (two) times a week. 26 capsule 3   hydrOXYzine (ATARAX/VISTARIL) 25 MG tablet Take 1 tablet (25 mg total) by mouth 3 (three) times daily as needed for anxiety. **NEEDS APT FOR FURTHER REFILL** 30 tablet 0   levothyroxine (SYNTHROID) 88 MCG tablet Take 1 tablet (88 mcg total) by mouth daily. 90 tablet 3   lorlatinib (LORBRENA) 100 MG tablet Take 1 tablet (100 mg total) by mouth daily. Swallow tablets whole. Do not chew, crush or split tablets. 30 tablet 2   No current facility-administered medications for this visit.    SURGICAL HISTORY:  Past Surgical History:  Procedure Laterality Date   BIOPSY  07/03/2019   Procedure: BIOPSY;  Surgeon: Rigoberto Noel, MD;  Location: [facility] ENDOSCOPY;  Service: Cardiopulmonary;;   BRONCHIAL BRUSHINGS  07/03/2019   Procedure: BRONCHIAL BRUSHINGS;  Surgeon: Rigoberto Noel, MD;  Location: [facility] ENDOSCOPY;  Service: Cardiopulmonary;;   BRONCHIAL WASHINGS  07/03/2019   Procedure: BRONCHIAL WASHINGS;  Surgeon: Rigoberto Noel, MD;  Location: [facility] ENDOSCOPY;  Service: Cardiopulmonary;;   NO PAST SURGERIES     TOOTH EXTRACTION     VIDEO BRONCHOSCOPY N/A 07/03/2019   Procedure: VIDEO BRONCHOSCOPY WITH FLUORO;  Surgeon: Elsworth Soho,  Leanna Sato, MD;  Location: [facility];  Service: Cardiopulmonary;  Laterality: N/A;  LMA    REVIEW OF SYSTEMS:  A comprehensive review of systems was negative except for: Constitutional: positive for fatigue   PHYSICAL EXAMINATION: General appearance: alert, cooperative, and no distress Head: Normocephalic, without obvious abnormality, atraumatic Neck: no adenopathy, no JVD, supple, symmetrical, trachea midline, and thyroid not enlarged, symmetric, no tenderness/mass/nodules Lymph nodes:  Cervical, supraclavicular, and axillary nodes normal. Resp: clear to auscultation bilaterally Back: symmetric, no curvature. ROM normal. No CVA tenderness. Cardio: regular rate and rhythm, S1, S2 normal, no murmur, click, rub or gallop GI: soft, non-tender; bowel sounds normal; no masses,  no organomegaly Extremities: extremities normal, atraumatic, no cyanosis or edema  ECOG PERFORMANCE STATUS: 1 - Symptomatic but completely ambulatory  Blood pressure (!) 152/84, pulse 71, temperature 98.4 F (36.9 C), temperature source Oral, resp. rate 18, weight 299 lb 4 oz (135.7 kg), last menstrual period 05/08/2022, SpO2 100 %.  LABORATORY DATA: Lab Results  Component Value Date   WBC 8.8 06/28/2022   HGB 12.7 06/28/2022   HCT 38.1 06/28/2022   MCV 83.7 06/28/2022   PLT 255 06/28/2022      Chemistry      Component Value Date/Time   NA 139 04/30/2022 0913   NA 143 08/28/2021 0811   K 3.7 04/30/2022 0913   CL 107 04/30/2022 0913   CO2 24 04/30/2022 0913   BUN 10 04/30/2022 0913   BUN 8 08/28/2021 0811   CREATININE 0.69 04/30/2022 0913      Component Value Date/Time   CALCIUM 9.4 04/30/2022 0913   ALKPHOS 54 04/30/2022 0913   AST 15 04/30/2022 0913   ALT 14 04/30/2022 0913   BILITOT 0.4 04/30/2022 0913       RADIOGRAPHIC STUDIES: No results found.   ASSESSMENT AND PLAN: This is a very pleasant 37 years old white female recently diagnosed with stage IV non-small cell lung cancer, adenocarcinoma with positive ALK gene translocation in March 2021.  The patient started treatment with Alecensa on July 23, 2019 and within few days she started not significant improvement in her condition with less shortness of breath as well as decrease in supraclavicular lymphadenopathy.  She is currently on treatment with Alecensa 600 mg p.o. twice daily status post 29 months of treatment. The patient was lost to follow-up for the last 6 months but she continues to do fine and she is taking her  medication with Alecensa (Alectinib) as prescribed.  Her treatment was discontinued secondary to disease progression. The patient has started treatment with Lorlatinib 100 mg p.o. daily status post 7 months of treatment. The patient continues to tolerate this treatment well with no concerning adverse effects. I recommended for her to continue her current treatment with Lorlatinib with the same dose. CBC today is unremarkable but comprehensive metabolic panel and lipid panel are still pending. For the drug-induced hypercholesterolemia, she will continue her current treatment with Lipitor. I will see her back for follow-up visit in 2 months for evaluation with repeat CT scan of the chest, abdomen and pelvis for restaging of her disease.  The patient voices understanding of current disease status and treatment options and is in agreement with the current care plan.  All questions were answered. The patient knows to call the clinic with any problems, questions or concerns. We can certainly see the patient much sooner if necessary. The total time spent in the appointment was 20 minutes.  Disclaimer: This note was dictated  with voice recognition software. Similar sounding words can inadvertently be transcribed and may not be corrected upon review.

## 2022-06-29 ENCOUNTER — Telehealth: Payer: Self-pay | Admitting: Internal Medicine

## 2022-06-29 NOTE — Telephone Encounter (Signed)
Scheduled per 03/21 los, patient has been called and notified. 

## 2022-07-03 ENCOUNTER — Other Ambulatory Visit (HOSPITAL_COMMUNITY): Payer: Self-pay

## 2022-07-09 ENCOUNTER — Other Ambulatory Visit: Payer: Self-pay

## 2022-07-30 ENCOUNTER — Other Ambulatory Visit: Payer: Self-pay | Admitting: Physician Assistant

## 2022-07-30 ENCOUNTER — Other Ambulatory Visit: Payer: Self-pay

## 2022-07-30 DIAGNOSIS — C3491 Malignant neoplasm of unspecified part of right bronchus or lung: Secondary | ICD-10-CM

## 2022-07-30 MED ORDER — LORLATINIB 100 MG PO TABS
100.0000 mg | ORAL_TABLET | Freq: Every day | ORAL | 2 refills | Status: DC
  Filled 2022-07-30: qty 30, 30d supply, fill #0
  Filled 2022-08-28: qty 30, 30d supply, fill #1
  Filled 2022-09-27: qty 30, 30d supply, fill #2

## 2022-07-31 ENCOUNTER — Ambulatory Visit (INDEPENDENT_AMBULATORY_CARE_PROVIDER_SITE_OTHER): Payer: Medicaid Other | Admitting: Internal Medicine

## 2022-07-31 ENCOUNTER — Other Ambulatory Visit (HOSPITAL_COMMUNITY): Payer: Self-pay

## 2022-07-31 ENCOUNTER — Encounter: Payer: Self-pay | Admitting: Internal Medicine

## 2022-07-31 ENCOUNTER — Telehealth: Payer: Self-pay | Admitting: Internal Medicine

## 2022-07-31 VITALS — BP 114/76 | HR 100 | Ht 70.0 in | Wt 298.0 lb

## 2022-07-31 DIAGNOSIS — E559 Vitamin D deficiency, unspecified: Secondary | ICD-10-CM | POA: Diagnosis not present

## 2022-07-31 DIAGNOSIS — E039 Hypothyroidism, unspecified: Secondary | ICD-10-CM

## 2022-07-31 DIAGNOSIS — Z8639 Personal history of other endocrine, nutritional and metabolic disease: Secondary | ICD-10-CM

## 2022-07-31 LAB — TSH: TSH: 10.2 u[IU]/mL — ABNORMAL HIGH (ref 0.35–5.50)

## 2022-07-31 LAB — VITAMIN D 25 HYDROXY (VIT D DEFICIENCY, FRACTURES): VITD: 71.49 ng/mL (ref 30.00–100.00)

## 2022-07-31 MED ORDER — LEVOTHYROXINE SODIUM 100 MCG PO TABS
100.0000 ug | ORAL_TABLET | Freq: Every day | ORAL | 3 refills | Status: DC
Start: 2022-07-31 — End: 2023-02-06

## 2022-07-31 MED ORDER — ERGOCALCIFEROL 1.25 MG (50000 UT) PO CAPS: 50000.0000 [IU] | ORAL_CAPSULE | ORAL | 3 refills | Status: DC

## 2022-07-31 NOTE — Telephone Encounter (Signed)
Please schedule the pt for repeat thyroid test in 2 months and let her know   Thanks

## 2022-07-31 NOTE — Patient Instructions (Signed)

## 2022-07-31 NOTE — Progress Notes (Signed)
Name: Sarah [place]  MRN/ DOB: [id number], 09/11/1985    Age/ Sex: 36 y.o., female     PCP: Carlean Jews, NP   Reason for Endocrinology Evaluation: Hyperthyroidism     Initial Endocrinology Clinic Visit: 06/08/2019    PATIENT IDENTIFIER: Sarah [place] is a 37 y.o., female with a past medical history of hyperthyroidism and metastatic non-small cell carcinoma (06/2019). Sarah [place] has followed with [facility] since 06/08/2019 for consultative assistance with management of her hyperthyroidism.   HISTORICAL SUMMARY:  Pt presented to the ED on 05/10/2019 with palpitations and fatigue. Sarah [place] was noted to have a suppressed TSH < 0.005 uIU/mL with elevated FT4 at 1.70 ng/dL .Sarah [place] was started on Methimazole at the time.   In the meantime Sarah [place] was diagnosed with metastatic lung ca and was on Alectinib. TSH trended up to 91 uIU/mL by 09/2019 and methimazole was stopped.   LT-4 replacement was started 12/2019  No FH of thyroid disease SUBJECTIVE:   Today (07/31/2022):  Sarah [place] is here for a follow up on hypothyroidism    Patient continues to follow-up with oncology for metastatic lung cancer, Sarah [place] was last seen by Dr. Shirline Frees 06/28/2022.  Sarah [place] is currently on Lorlatinib, alectinib had to be discontinued due to disease progression.  Weight has been fluctuating  Denies local neck swelling  Denis constipation or diarrhea  Denies palpitations  Denies arthralgia   Levothyroxine 88 mcg daily  Ergocalciferol 50, 000 iu twice weekly  ( MOndays )     HISTORY:  Past Medical History:  Past Medical History:  Diagnosis Date   nscl ca dx'd 06/2019   Pneumonia 05/08/2019   Past Surgical History:  Past Surgical History:  Procedure Laterality Date   BIOPSY  07/03/2019   Procedure: BIOPSY;  Surgeon: Oretha Milch, MD;  Location: [facility] ENDOSCOPY;  Service: Cardiopulmonary;;   BRONCHIAL BRUSHINGS  07/03/2019   Procedure: BRONCHIAL BRUSHINGS;  Surgeon: Oretha Milch, MD;  Location: [facility]  ENDOSCOPY;  Service: Cardiopulmonary;;   BRONCHIAL WASHINGS  07/03/2019   Procedure: BRONCHIAL WASHINGS;  Surgeon: Oretha Milch, MD;  Location: [facility] ENDOSCOPY;  Service: Cardiopulmonary;;   NO PAST SURGERIES     TOOTH EXTRACTION     VIDEO BRONCHOSCOPY N/A 07/03/2019   Procedure: VIDEO BRONCHOSCOPY WITH FLUORO;  Surgeon: Oretha Milch, MD;  Location: [facility] ENDOSCOPY;  Service: Cardiopulmonary;  Laterality: N/A;  LMA   Social History:  reports that Sarah [place] has never smoked. Sarah [place] has never used smokeless tobacco. Sarah [place] reports current alcohol use. Sarah [place] reports that Sarah [place] does not use drugs. Family History:  Family History  Problem Relation Age of Onset   Diabetes Father    Hyperlipidemia Father    Hypertension Father    Depression Maternal Uncle    Diabetes Maternal Uncle    Cancer Maternal Grandmother    Hyperlipidemia Maternal Grandmother    Cancer Paternal Grandfather      HOME MEDICATIONS: Allergies as of 07/31/2022       Reactions   Iron Other (See Comments)   Ferrosol - Liquid  Unknown reaction         Medication List        Accurate as of July 31, 2022  7:58 AM. If you have any questions, ask your nurse or doctor.          acetaminophen 500 MG tablet Commonly known as: TYLENOL Take 1,000 mg by mouth every 6 (six) hours as needed for moderate pain or headache.  albuterol 108 (90 Base) MCG/ACT inhaler Commonly known as: VENTOLIN HFA Inhale 2 puffs into the lungs every 6 (six) hours as needed for wheezing or shortness of breath.   atorvastatin 40 MG tablet Commonly known as: LIPITOR Take 0.5 tablets (20 mg total) by mouth daily.   citalopram 10 MG tablet Commonly known as: CELEXA Take 1 tablet (10 mg total) by mouth daily.   ergocalciferol 1.25 MG (50000 UT) capsule Commonly known as: VITAMIN D2 Take 1 capsule (50,000 Units total) by mouth 2 (two) times a week.   hydrOXYzine 25 MG tablet Commonly known as: ATARAX Take 1 tablet (25 mg total) by mouth 3 (three)  times daily as needed for anxiety. **NEEDS APT FOR FURTHER REFILL**   levothyroxine 88 MCG tablet Commonly known as: SYNTHROID Take 1 tablet (88 mcg total) by mouth daily.   lorlatinib 100 MG tablet Commonly known as: LORBRENA Take 1 tablet (100 mg total) by mouth daily. Swallow tablets whole. Do not chew, crush or split tablets.          OBJECTIVE:   PHYSICAL EXAM: VS: BP 114/76 (BP Location: Right Arm, Patient Position: Sitting, Cuff Size: Large)   Pulse 100   Ht 5\' 10"  (1.778 m)   Wt 298 lb (135.2 kg)   SpO2 98%   BMI 42.76 kg/m    EXAM: General: Pt appears well and is in NAD Right eye stare but no exophthalmos  Neck: General: Supple without adenopathy. Thyroid: Thyroid size normal.  No goiter or nodules appreciated.   Lungs: Clear with good BS bilat   Heart: Auscultation: RRR.  Extremities:  BL LE: No pretibial edema normal ROM and strength.     DATA REVIEWED:  Latest Reference Range & Units 07/31/22 08:10  TSH 0.35 - 5.50 uIU/mL 10.20 (H)  (H): Data is abnormally high  Latest Reference Range & Units 07/31/22 08:10  VITD 30.00 - 100.00 ng/mL 71.49     Latest Reference Range & Units 06/28/22 13:06  Sodium 135 - 145 mmol/L 139  Potassium 3.5 - 5.1 mmol/L 4.0  Chloride 98 - 111 mmol/L 108  CO2 22 - 32 mmol/L 23  Glucose 70 - 99 mg/dL 93  BUN 6 - 20 mg/dL 7  Creatinine 9.62 - 9.52 mg/dL 8.41  Calcium 8.9 - 32.4 mg/dL 9.2  Anion gap 5 - 15  8  Alkaline Phosphatase 38 - 126 U/L 54  Albumin 3.5 - 5.0 g/dL 4.2  AST 15 - 41 U/L 18  ALT 0 - 44 U/L 24  Total Protein 6.5 - 8.1 g/dL 7.4  Total Bilirubin 0.3 - 1.2 mg/dL 0.3  GFR, Est Non African American >60 mL/min >60     Results for JARIYA, REICHOW (MRN [id number]) as of 12/25/2019 09:03  Ref. Range 06/08/2019 14:52  TRAB Latest Ref Range: <=2.00 IU/L 5.19 (H)   ASSESSMENT / PLAN / RECOMMENDATIONS:   Hypothyroidism :    - Sarah [place] was initially diagnosed with hyperthyroidism secondary to Graves' disease in  04/2019, Sarah [place] had to be started on Alectinib in 07/2019 for metastatic lung cancer and by 09/2019 Sarah [place] was off Methimazole followed by hypothyroidism .  - No local neck symptoms  - TSH is  elevated  - Will increase LT-4 replacement as below  - Repeat TFT's in 2 months    Medication  Stop  levothyroxine 88 MCG daily Levothyroxine 100 mcg daily      2. Hx of Graves' Disease:  -No extrathyroidal manifestations of Graves' disease  3.  Vitamin D deficiency:  - Repeat Vitamin D is normal  Medication   - Continue ergocalciferol 50,000 iu twice weekly     F/U in 1 year    Signed electronically by: Lyndle Herrlich, MD  [facility]  [facility] [address] [place] [place], [place] [address] Phone: [phone] FAX: [phone]      CC: Carlean Jews, NP [address] [name] [address] Phone: [phone]  Fax: [phone]   Return to Endocrinology clinic as below: Future Appointments  Date Time Provider Department Center  07/31/2022  8:10 AM Adelheid Hoggard, Konrad Dolores, MD [facility] None  08/27/2022  9:30 AM [facility] None  08/30/2022  8:15 AM Si Gaul, MD [facility] None  10/03/2022  8:30 AM Carlean Jews, NP [facility] None

## 2022-08-01 ENCOUNTER — Other Ambulatory Visit: Payer: Self-pay

## 2022-08-01 NOTE — Telephone Encounter (Signed)
Patient is scheduled for 10/01/2022 at 8:15 AM for repeat Thyroid lab.

## 2022-08-03 ENCOUNTER — Other Ambulatory Visit (HOSPITAL_COMMUNITY): Payer: Self-pay

## 2022-08-08 ENCOUNTER — Other Ambulatory Visit: Payer: Self-pay

## 2022-08-09 ENCOUNTER — Telehealth: Payer: Self-pay | Admitting: Internal Medicine

## 2022-08-09 NOTE — Telephone Encounter (Signed)
Called patient regarding upcoming May appointments, left a voicemail. ?

## 2022-08-20 ENCOUNTER — Other Ambulatory Visit: Payer: Self-pay | Admitting: Internal Medicine

## 2022-08-27 ENCOUNTER — Ambulatory Visit (HOSPITAL_COMMUNITY)
Admission: RE | Admit: 2022-08-27 | Discharge: 2022-08-27 | Disposition: A | Discharge status: Home / Self Care | Payer: Medicaid Other | Source: Ambulatory Visit | Attending: Internal Medicine | Admitting: Internal Medicine

## 2022-08-27 ENCOUNTER — Inpatient Hospital Stay: Payer: Medicaid Other | Attending: Internal Medicine

## 2022-08-27 DIAGNOSIS — C7931 Secondary malignant neoplasm of brain: Secondary | ICD-10-CM | POA: Diagnosis not present

## 2022-08-27 DIAGNOSIS — C3431 Malignant neoplasm of lower lobe, right bronchus or lung: Secondary | ICD-10-CM | POA: Insufficient documentation

## 2022-08-27 DIAGNOSIS — C3490 Malignant neoplasm of unspecified part of unspecified bronchus or lung: Secondary | ICD-10-CM | POA: Insufficient documentation

## 2022-08-27 DIAGNOSIS — C7951 Secondary malignant neoplasm of bone: Secondary | ICD-10-CM | POA: Diagnosis not present

## 2022-08-27 LAB — CMP (CANCER CENTER ONLY)
ALT: 22 U/L (ref 0–44)
AST: 18 U/L (ref 15–41)
Albumin: 4.3 g/dL (ref 3.5–5.0)
Alkaline Phosphatase: 55 U/L (ref 38–126)
Anion gap: 7 (ref 5–15)
BUN: 8 mg/dL (ref 6–20)
CO2: 26 mmol/L (ref 22–32)
Calcium: 9.4 mg/dL (ref 8.9–10.3)
Chloride: 107 mmol/L (ref 98–111)
Creatinine: 0.69 mg/dL (ref 0.44–1.00)
GFR, Estimated: >60 mL/min (ref >60)
Glucose, Bld: 93 mg/dL (ref 70–99)
Potassium: 4.3 mmol/L (ref 3.5–5.1)
Sodium: 140 mmol/L (ref 135–145)
Total Bilirubin: 0.3 mg/dL (ref 0.3–1.2)
Total Protein: 7.5 g/dL (ref 6.5–8.1)

## 2022-08-27 LAB — CBC WITH DIFFERENTIAL (CANCER CENTER ONLY)
Abs Immature Granulocytes: 0.02 10*3/uL (ref 0.00–0.07)
Basophils Absolute: 0.1 10*3/uL (ref 0.0–0.1)
Basophils Relative: 1 %
Eosinophils Absolute: 0.4 10*3/uL (ref 0.0–0.5)
Eosinophils Relative: 4 %
HCT: 39.7 % (ref 36.0–46.0)
Hemoglobin: 12.8 g/dL (ref 12.0–15.0)
Immature Granulocytes: 0 %
Lymphocytes Relative: 36 %
Lymphs Abs: 3.4 10*3/uL (ref 0.7–4.0)
MCH: 27.7 pg (ref 26.0–34.0)
MCHC: 32.2 g/dL (ref 30.0–36.0)
MCV: 85.9 fL (ref 80.0–100.0)
Monocytes Absolute: 0.9 10*3/uL (ref 0.1–1.0)
Monocytes Relative: 9 %
Neutro Abs: 4.7 10*3/uL (ref 1.7–7.7)
Neutrophils Relative %: 50 %
Platelet Count: 223 10*3/uL (ref 150–400)
RBC: 4.62 MIL/uL (ref 3.87–5.11)
RDW: 13.9 % (ref 11.5–15.5)
WBC Count: 9.4 10*3/uL (ref 4.0–10.5)
nRBC: 0 % (ref 0.0–0.2)

## 2022-08-27 MED ORDER — SODIUM CHLORIDE (PF) 0.9 % IJ SOLN
INTRAMUSCULAR | Status: AC
  Filled 2022-08-27: qty 50

## 2022-08-27 MED ORDER — IOHEXOL 300 MG/ML  SOLN
100.0000 mL | Freq: Once | INTRAMUSCULAR | Status: AC | PRN
  Administered 2022-08-27: 100 mL via INTRAVENOUS

## 2022-08-28 ENCOUNTER — Other Ambulatory Visit (HOSPITAL_COMMUNITY): Payer: Self-pay

## 2022-08-29 ENCOUNTER — Other Ambulatory Visit (HOSPITAL_COMMUNITY): Payer: Self-pay

## 2022-08-30 ENCOUNTER — Other Ambulatory Visit (HOSPITAL_COMMUNITY): Payer: Self-pay

## 2022-08-30 ENCOUNTER — Inpatient Hospital Stay (HOSPITAL_BASED_OUTPATIENT_CLINIC_OR_DEPARTMENT_OTHER): Payer: Medicaid Other | Admitting: Internal Medicine

## 2022-08-30 ENCOUNTER — Ambulatory Visit: Payer: Medicaid Other | Admitting: Internal Medicine

## 2022-08-30 VITALS — BP 153/99 | HR 110 | Temp 98.2°F | Resp 18 | Wt 305.4 lb

## 2022-08-30 DIAGNOSIS — C3491 Malignant neoplasm of unspecified part of right bronchus or lung: Secondary | ICD-10-CM

## 2022-08-30 DIAGNOSIS — C3431 Malignant neoplasm of lower lobe, right bronchus or lung: Secondary | ICD-10-CM | POA: Diagnosis not present

## 2022-08-30 NOTE — Progress Notes (Signed)
[facility] [phone]   [phone]  OFFICE PROGRESS NOTE  Carlean Jews, NP [address] [name] [address]  DIAGNOSIS: Stage IV (T3, N2, M1c) non-small cell lung cancer, adenocarcinoma with ALK gene translocation presented with multiple bilateral pulmonary nodules and masses as well as small mediastinal and bilateral axillary lymphadenopathy and metastatic disease to the brain diagnosed in March 2021.  PRIOR THERAPY: Alecensa (Alectinib) 600 mg p.o. twice daily.  First dose started on July 23 2019.  Status post 29 months of treatment.  CURRENT THERAPY: Lorlatinib 100 mg p.o. daily.  First dose was December 06, 2021 status post 9 months of treatment.  INTERVAL HISTORY: Sarah Chandler 37 y.o. female returns to the clinic today for follow-up visit.  The patient is feeling fine today with no concerning complaints.  She denied having any chest pain, shortness of breath, cough or hemoptysis.  She has no nausea, vomiting, diarrhea or constipation.  She has no headache or visual changes.  She denied having any recent weight loss or night sweats.  She has been tolerating her treatment with Lorlatinib fairly well.  She is here today for evaluation with repeat blood work as well as CT scan of the chest, abdomen and pelvis for restaging of her disease.  MEDICAL HISTORY: Past Medical History:  Diagnosis Date   nscl ca dx'd 06/2019   Pneumonia 05/08/2019    ALLERGIES:  is allergic to iron.  MEDICATIONS:  Current Outpatient Medications  Medication Sig Dispense Refill   acetaminophen (TYLENOL) 500 MG tablet Take 1,000 mg by mouth every 6 (six) hours as needed for moderate pain or headache.     albuterol (VENTOLIN HFA) 108 (90 Base) MCG/ACT inhaler Inhale 2 puffs into the lungs every 6 (six) hours as needed for wheezing or shortness of breath. 8 g 1   atorvastatin (LIPITOR) 40 MG tablet TAKE 1/2 TABLET BY MOUTH DAILY 45 tablet 2   citalopram  (CELEXA) 10 MG tablet Take 1 tablet (10 mg total) by mouth daily. 90 tablet 1   ergocalciferol (VITAMIN D2) 1.25 MG (50000 UT) capsule Take 1 capsule (50,000 Units total) by mouth 2 (two) times a week. 26 capsule 3   hydrOXYzine (ATARAX/VISTARIL) 25 MG tablet Take 1 tablet (25 mg total) by mouth 3 (three) times daily as needed for anxiety. **NEEDS APT FOR FURTHER REFILL** 30 tablet 0   levothyroxine (SYNTHROID) 100 MCG tablet Take 1 tablet (100 mcg total) by mouth daily. 90 tablet 3   lorlatinib (LORBRENA) 100 MG tablet Take 1 tablet (100 mg total) by mouth daily. Swallow tablets whole. Do not chew, crush or split tablets. 30 tablet 2   No current facility-administered medications for this visit.    SURGICAL HISTORY:  Past Surgical History:  Procedure Laterality Date   BIOPSY  07/03/2019   Procedure: BIOPSY;  Surgeon: Oretha Milch, MD;  Location: [facility] ENDOSCOPY;  Service: Cardiopulmonary;;   BRONCHIAL BRUSHINGS  07/03/2019   Procedure: BRONCHIAL BRUSHINGS;  Surgeon: Oretha Milch, MD;  Location: [facility] ENDOSCOPY;  Service: Cardiopulmonary;;   BRONCHIAL WASHINGS  07/03/2019   Procedure: BRONCHIAL WASHINGS;  Surgeon: Oretha Milch, MD;  Location: [facility] ENDOSCOPY;  Service: Cardiopulmonary;;   NO PAST SURGERIES     TOOTH EXTRACTION     VIDEO BRONCHOSCOPY N/A 07/03/2019   Procedure: VIDEO BRONCHOSCOPY WITH FLUORO;  Surgeon: Oretha Milch, MD;  Location: [facility] ENDOSCOPY;  Service: Cardiopulmonary;  Laterality: N/A;  LMA  REVIEW OF SYSTEMS:  Constitutional: negative Eyes: negative Ears, nose, mouth, throat, and face: negative Respiratory: negative Cardiovascular: negative Gastrointestinal: negative Genitourinary:negative Integument/breast: negative Hematologic/lymphatic: negative Musculoskeletal:negative Neurological: negative Behavioral/Psych: negative Endocrine: negative Allergic/Immunologic: negative   PHYSICAL EXAMINATION: General appearance: alert, cooperative, and no distress Head:  Normocephalic, without obvious abnormality, atraumatic Neck: no adenopathy, no JVD, supple, symmetrical, trachea midline, and thyroid not enlarged, symmetric, no tenderness/mass/nodules Lymph nodes: Cervical, supraclavicular, and axillary nodes normal. Resp: clear to auscultation bilaterally Back: symmetric, no curvature. ROM normal. No CVA tenderness. Cardio: regular rate and rhythm, S1, S2 normal, no murmur, click, rub or gallop GI: soft, non-tender; bowel sounds normal; no masses,  no organomegaly Extremities: extremities normal, atraumatic, no cyanosis or edema Neurologic: Alert and oriented X 3, normal strength and tone. Normal symmetric reflexes. Normal coordination and gait  ECOG PERFORMANCE STATUS: 1 - Symptomatic but completely ambulatory  Blood pressure (!) 153/99, pulse (!) 110, temperature 98.2 F (36.8 C), temperature source Oral, resp. rate 18, weight (!) 305 lb 6.4 oz (138.5 kg), SpO2 98 %.  LABORATORY DATA: Lab Results  Component Value Date   WBC 9.4 08/27/2022   HGB 12.8 08/27/2022   HCT 39.7 08/27/2022   MCV 85.9 08/27/2022   PLT 223 08/27/2022      Chemistry      Component Value Date/Time   NA 140 08/27/2022 1312   NA 143 08/28/2021 0811   K 4.3 08/27/2022 1312   CL 107 08/27/2022 1312   CO2 26 08/27/2022 1312   BUN 8 08/27/2022 1312   BUN 8 08/28/2021 0811   CREATININE 0.69 08/27/2022 1312      Component Value Date/Time   CALCIUM 9.4 08/27/2022 1312   ALKPHOS 55 08/27/2022 1312   AST 18 08/27/2022 1312   ALT 22 08/27/2022 1312   BILITOT 0.3 08/27/2022 1312       RADIOGRAPHIC STUDIES: CT Abdomen Pelvis W Contrast  Result Date: 08/27/2022 CLINICAL DATA:  History of stage IV non-small cell lung cancer, follow-up. * Tracking Code: BO * EXAM: CT CHEST, ABDOMEN, AND PELVIS WITH CONTRAST TECHNIQUE: Multidetector CT imaging of the chest, abdomen and pelvis was performed following the standard protocol during bolus administration of intravenous contrast.  RADIATION DOSE REDUCTION: This exam was performed according to the departmental dose-optimization program which includes automated exposure control, adjustment of the mA and/or kV according to patient size and/or use of iterative reconstruction technique. CONTRAST:  OMNIPAQUE IOHEXOL 300 MG/ML  SOLN COMPARISON:  Multiple priors including most recent CT chest abdomen pelvis April 30, 2022 FINDINGS: CT CHEST FINDINGS Cardiovascular: Normal caliber abdominal aorta. Smooth IVC contours. Normal size heart. No significant pericardial effusion/thickening. Mediastinum/Nodes: No suspicious thyroid nodule. No pathologically enlarged mediastinal, hilar or axillary lymph nodes. The esophagus is grossly unremarkable. Lungs/Pleura: Continued decreased conspicuity of the previously indexed and non indexed pulmonary neoplastic lesions the majority of which now reflects a linear hazy bands of opacity. Previously indexed lesion in the paramedian right upper lobe measures 6 x 5 mm on image 59/4 previously 7 x 6 mm. No new suspicious pulmonary nodules or masses. No pleural effusion. No pneumothorax. Musculoskeletal: No significant interval change in the multifocal sclerotic osseous foci throughout the axial skeleton involving the sternum, clavicles, scapula, ribs and spine. No new suspicious osseous lesion. No pathologic fracture. CT ABDOMEN PELVIS FINDINGS Hepatobiliary: No suspicious hepatic lesion. Gallbladder is unremarkable. No biliary ductal dilation. Pancreas: No pancreatic ductal dilation or evidence of acute inflammation. Spleen: No splenomegaly. Adrenals/Urinary Tract: Bilateral adrenal glands  appear normal. No hydronephrosis. Kidneys demonstrate symmetric enhancement. Urinary bladder is unremarkable for degree of distension. Stomach/Bowel: No radiopaque enteric contrast material was administered. No pathologic dilation of large or small bowel. Moderate volume of formed stool in the colon. Vascular/Lymphatic: Normal  caliber abdominal aorta. Smooth IVC contours. No pathologically enlarged abdominal or pelvic lymph nodes. Reproductive: Uterus and left adnexa are unremarkable. Decreased size of the 15 mm dominant follicle in the right ovary, considered benign requiring no independent imaging follow-up. Other: No significant abdominopelvic free fluid. Musculoskeletal: Similar appearance of the extensive scattered sclerotic osseous metastatic lesions throughout the axial and proximal appendicular skeleton. IMPRESSION: 1. Continued decreased conspicuity of the previously indexed and non indexed pulmonary neoplastic lesions the majority of which now reflects a linear hazy bands of opacity, consistent with treatment response. 2. No significant interval change in the extensive scattered sclerotic osseous metastatic lesions throughout the axial and proximal appendicular skeleton. 3. No evidence of new or progressive metastatic disease in the chest, abdomen or pelvis. Electronically Signed   By: Maudry Mayhew M.D.   On: 08/27/2022 17:06   CT Chest W Contrast  Result Date: 08/27/2022 CLINICAL DATA:  History of stage IV non-small cell lung cancer, follow-up. * Tracking Code: BO * EXAM: CT CHEST, ABDOMEN, AND PELVIS WITH CONTRAST TECHNIQUE: Multidetector CT imaging of the chest, abdomen and pelvis was performed following the standard protocol during bolus administration of intravenous contrast. RADIATION DOSE REDUCTION: This exam was performed according to the departmental dose-optimization program which includes automated exposure control, adjustment of the mA and/or kV according to patient size and/or use of iterative reconstruction technique. CONTRAST:  OMNIPAQUE IOHEXOL 300 MG/ML  SOLN COMPARISON:  Multiple priors including most recent CT chest abdomen pelvis April 30, 2022 FINDINGS: CT CHEST FINDINGS Cardiovascular: Normal caliber abdominal aorta. Smooth IVC contours. Normal size heart. No significant pericardial  effusion/thickening. Mediastinum/Nodes: No suspicious thyroid nodule. No pathologically enlarged mediastinal, hilar or axillary lymph nodes. The esophagus is grossly unremarkable. Lungs/Pleura: Continued decreased conspicuity of the previously indexed and non indexed pulmonary neoplastic lesions the majority of which now reflects a linear hazy bands of opacity. Previously indexed lesion in the paramedian right upper lobe measures 6 x 5 mm on image 59/4 previously 7 x 6 mm. No new suspicious pulmonary nodules or masses. No pleural effusion. No pneumothorax. Musculoskeletal: No significant interval change in the multifocal sclerotic osseous foci throughout the axial skeleton involving the sternum, clavicles, scapula, ribs and spine. No new suspicious osseous lesion. No pathologic fracture. CT ABDOMEN PELVIS FINDINGS Hepatobiliary: No suspicious hepatic lesion. Gallbladder is unremarkable. No biliary ductal dilation. Pancreas: No pancreatic ductal dilation or evidence of acute inflammation. Spleen: No splenomegaly. Adrenals/Urinary Tract: Bilateral adrenal glands appear normal. No hydronephrosis. Kidneys demonstrate symmetric enhancement. Urinary bladder is unremarkable for degree of distension. Stomach/Bowel: No radiopaque enteric contrast material was administered. No pathologic dilation of large or small bowel. Moderate volume of formed stool in the colon. Vascular/Lymphatic: Normal caliber abdominal aorta. Smooth IVC contours. No pathologically enlarged abdominal or pelvic lymph nodes. Reproductive: Uterus and left adnexa are unremarkable. Decreased size of the 15 mm dominant follicle in the right ovary, considered benign requiring no independent imaging follow-up. Other: No significant abdominopelvic free fluid. Musculoskeletal: Similar appearance of the extensive scattered sclerotic osseous metastatic lesions throughout the axial and proximal appendicular skeleton. IMPRESSION: 1. Continued decreased conspicuity  of the previously indexed and non indexed pulmonary neoplastic lesions the majority of which now reflects a linear hazy bands of opacity, consistent  with treatment response. 2. No significant interval change in the extensive scattered sclerotic osseous metastatic lesions throughout the axial and proximal appendicular skeleton. 3. No evidence of new or progressive metastatic disease in the chest, abdomen or pelvis. Electronically Signed   By: Maudry Mayhew M.D.   On: 08/27/2022 17:06     ASSESSMENT AND PLAN: This is a very pleasant 37 years old white female recently diagnosed with stage IV non-small cell lung cancer, adenocarcinoma with positive ALK gene translocation in March 2021.  The patient started treatment with Alecensa on July 23, 2019 and within few days she started not significant improvement in her condition with less shortness of breath as well as decrease in supraclavicular lymphadenopathy.  She is currently on treatment with Alecensa 600 mg p.o. twice daily status post 29 months of treatment. The patient was lost to follow-up for the last 6 months but she continues to do fine and she is taking her medication with Alecensa (Alectinib) as prescribed.  Her treatment was discontinued secondary to disease progression. The patient has started treatment with Lorlatinib 100 mg p.o. daily status post 9 months of treatment. The patient has been tolerating this treatment well with no concerning adverse effects. She had repeat CT scan of the chest, abdomen and pelvis performed recently.  I personally and independently reviewed the scan and discussed the result with the patient today. Her scan showed no concerning findings for disease progression and there was further improvement in her disease. I recommended for the patient to continue her current treatment with Lorlatinib with the same dose. For the drug induced hypercholesterolemia, she will continue her current treatment with Lipitor and we will  adjust the dose as needed. The patient will come back for follow-up visit in 2 months for evaluation and repeat blood work. She was advised to call immediately if she has any other concerning symptoms in the interval.  The patient voices understanding of current disease status and treatment options and is in agreement with the current care plan.  All questions were answered. The patient knows to call the clinic with any problems, questions or concerns. We can certainly see the patient much sooner if necessary. The total time spent in the appointment was 30 minutes.  Disclaimer: This note was dictated with voice recognition software. Similar sounding words can inadvertently be transcribed and may not be corrected upon review.

## 2022-09-27 ENCOUNTER — Other Ambulatory Visit (HOSPITAL_COMMUNITY): Payer: Self-pay

## 2022-09-28 ENCOUNTER — Other Ambulatory Visit: Payer: Self-pay

## 2022-09-28 ENCOUNTER — Other Ambulatory Visit (HOSPITAL_COMMUNITY): Payer: Self-pay

## 2022-10-01 ENCOUNTER — Other Ambulatory Visit (INDEPENDENT_AMBULATORY_CARE_PROVIDER_SITE_OTHER): Payer: Medicaid Other

## 2022-10-01 DIAGNOSIS — E039 Hypothyroidism, unspecified: Secondary | ICD-10-CM

## 2022-10-01 LAB — TSH: TSH: 4.12 u[IU]/mL (ref 0.35–5.50)

## 2022-10-02 NOTE — Progress Notes (Unsigned)
Established patient visit   Patient: Sarah Chandler   DOB: Jul 20, 1985   37 y.o. Female  MRN: [id number] Visit Date: 10/03/2022   No chief complaint on file.  Subjective    HPI  Follow up -treatment for primary adenocarcinoma or right lung.   -added citalopram 10 mg daily  -referred for therapy    Medications: Outpatient Medications Prior to Visit  Medication Sig   acetaminophen (TYLENOL) 500 MG tablet Take 1,000 mg by mouth every 6 (six) hours as needed for moderate pain or headache.   albuterol (VENTOLIN HFA) 108 (90 Base) MCG/ACT inhaler Inhale 2 puffs into the lungs every 6 (six) hours as needed for wheezing or shortness of breath.   atorvastatin (LIPITOR) 40 MG tablet TAKE 1/2 TABLET BY MOUTH DAILY   citalopram (CELEXA) 10 MG tablet Take 1 tablet (10 mg total) by mouth daily.   ergocalciferol (VITAMIN D2) 1.25 MG (50000 UT) capsule Take 1 capsule (50,000 Units total) by mouth 2 (two) times a week.   hydrOXYzine (ATARAX/VISTARIL) 25 MG tablet Take 1 tablet (25 mg total) by mouth 3 (three) times daily as needed for anxiety. **NEEDS APT FOR FURTHER REFILL**   levothyroxine (SYNTHROID) 100 MCG tablet Take 1 tablet (100 mcg total) by mouth daily.   lorlatinib (LORBRENA) 100 MG tablet Take 1 tablet (100 mg total) by mouth daily. Swallow tablets whole. Do not chew, crush or split tablets.   No facility-administered medications prior to visit.    Review of Systems  {Labs (Optional):23779}   Objective    There were no vitals filed for this visit. There is no height or weight on file to calculate BMI.  BP Readings from Last 3 Encounters:  08/30/22 (Abnormal) 153/99  07/31/22 114/76  06/28/22 (Abnormal) 152/84    Wt Readings from Last 3 Encounters:  08/30/22 (Abnormal) 305 lb 6.4 oz (138.5 kg)  07/31/22 298 lb (135.2 kg)  06/28/22 299 lb 4 oz (135.7 kg)    Physical Exam  ***  No results found for any visits on 10/03/22.  Assessment & Plan    There are no diagnoses  linked to this encounter.   Return in about 6 months (around 04/04/2023) for health maintenance exam, FBW a week prior to visit.         Carlean Jews, NP  [facility] [phone] (fax)  [facility]

## 2022-10-03 ENCOUNTER — Encounter: Payer: Self-pay | Admitting: Nurse Practitioner

## 2022-10-03 ENCOUNTER — Ambulatory Visit (INDEPENDENT_AMBULATORY_CARE_PROVIDER_SITE_OTHER): Payer: Medicaid Other | Admitting: Nurse Practitioner

## 2022-10-03 VITALS — BP 148/94 | HR 85 | Ht 70.0 in | Wt 311.1 lb

## 2022-10-03 DIAGNOSIS — C3491 Malignant neoplasm of unspecified part of right bronchus or lung: Secondary | ICD-10-CM

## 2022-10-03 DIAGNOSIS — R030 Elevated blood-pressure reading, without diagnosis of hypertension: Secondary | ICD-10-CM

## 2022-10-03 DIAGNOSIS — F321 Major depressive disorder, single episode, moderate: Secondary | ICD-10-CM

## 2022-10-03 DIAGNOSIS — R632 Polyphagia: Secondary | ICD-10-CM | POA: Diagnosis not present

## 2022-10-03 DIAGNOSIS — R635 Abnormal weight gain: Secondary | ICD-10-CM

## 2022-10-03 MED ORDER — CITALOPRAM HYDROBROMIDE 20 MG PO TABS
20.0000 mg | ORAL_TABLET | Freq: Every day | ORAL | 1 refills | Status: DC
Start: 2022-10-03 — End: 2022-12-12

## 2022-10-03 NOTE — Assessment & Plan Note (Signed)
Patient followed per Dr. Shirline Frees at [facility].

## 2022-10-03 NOTE — Assessment & Plan Note (Signed)
Abnormal weight gain due to unhealthy eating patterns.  -refer to [facility] for further evaluation and treatment.

## 2022-10-03 NOTE — Assessment & Plan Note (Signed)
Patent referred to [facility] for further evaluation.  Contact information given for her to schedule new patient appointment with [facility].

## 2022-10-03 NOTE — Assessment & Plan Note (Signed)
Increase citalopram to 20 mg daily  -contact information given for her to schedule new patient appointment with [facility] for further evaluation.

## 2022-10-03 NOTE — Assessment & Plan Note (Signed)
Encouraged her to limit salt in her diet and increase water.  -reassess in 2 months

## 2022-10-03 NOTE — Assessment & Plan Note (Signed)
Discussed lowering calorie intake to 1500 calories per day and incorporating exercise into daily routine to help lose weight.  °

## 2022-10-25 ENCOUNTER — Other Ambulatory Visit (HOSPITAL_COMMUNITY): Payer: Self-pay

## 2022-10-25 ENCOUNTER — Other Ambulatory Visit: Payer: Self-pay | Admitting: Physician Assistant

## 2022-10-25 ENCOUNTER — Other Ambulatory Visit: Payer: Self-pay

## 2022-10-25 DIAGNOSIS — C3491 Malignant neoplasm of unspecified part of right bronchus or lung: Secondary | ICD-10-CM

## 2022-10-25 MED ORDER — LORLATINIB 100 MG PO TABS
100.0000 mg | ORAL_TABLET | Freq: Every day | ORAL | 2 refills | Status: DC
Start: 2022-10-25 — End: 2023-01-15
  Filled 2022-10-25: qty 30, 30d supply, fill #0
  Filled 2022-11-21: qty 30, 30d supply, fill #1
  Filled 2022-12-18: qty 30, 30d supply, fill #2

## 2022-10-26 ENCOUNTER — Other Ambulatory Visit (HOSPITAL_COMMUNITY): Payer: Self-pay

## 2022-10-29 ENCOUNTER — Other Ambulatory Visit (HOSPITAL_COMMUNITY): Payer: Self-pay

## 2022-10-30 ENCOUNTER — Inpatient Hospital Stay: Payer: Medicaid Other | Attending: Internal Medicine

## 2022-10-30 ENCOUNTER — Inpatient Hospital Stay: Payer: Medicaid Other | Admitting: Internal Medicine

## 2022-10-30 ENCOUNTER — Other Ambulatory Visit: Payer: Self-pay

## 2022-10-30 VITALS — BP 138/56 | HR 73 | Temp 98.3°F | Resp 18 | Ht 70.0 in | Wt 307.9 lb

## 2022-10-30 DIAGNOSIS — C3431 Malignant neoplasm of lower lobe, right bronchus or lung: Secondary | ICD-10-CM | POA: Diagnosis not present

## 2022-10-30 DIAGNOSIS — C7931 Secondary malignant neoplasm of brain: Secondary | ICD-10-CM | POA: Insufficient documentation

## 2022-10-30 DIAGNOSIS — C3490 Malignant neoplasm of unspecified part of unspecified bronchus or lung: Secondary | ICD-10-CM

## 2022-10-30 DIAGNOSIS — C3492 Malignant neoplasm of unspecified part of left bronchus or lung: Secondary | ICD-10-CM | POA: Insufficient documentation

## 2022-10-30 DIAGNOSIS — Z79899 Other long term (current) drug therapy: Secondary | ICD-10-CM | POA: Diagnosis not present

## 2022-10-30 DIAGNOSIS — C3491 Malignant neoplasm of unspecified part of right bronchus or lung: Secondary | ICD-10-CM

## 2022-10-30 LAB — CBC WITH DIFFERENTIAL (CANCER CENTER ONLY)
Abs Immature Granulocytes: 0.03 10*3/uL (ref 0.00–0.07)
Basophils Absolute: 0.1 10*3/uL (ref 0.0–0.1)
Basophils Relative: 1 %
Eosinophils Absolute: 0.4 10*3/uL (ref 0.0–0.5)
Eosinophils Relative: 5 %
HCT: 37.5 % (ref 36.0–46.0)
Hemoglobin: 12.5 g/dL (ref 12.0–15.0)
Immature Granulocytes: 0 %
Lymphocytes Relative: 38 %
Lymphs Abs: 3.2 10*3/uL (ref 0.7–4.0)
MCH: 28.8 pg (ref 26.0–34.0)
MCHC: 33.3 g/dL (ref 30.0–36.0)
MCV: 86.4 fL (ref 80.0–100.0)
Monocytes Absolute: 0.7 10*3/uL (ref 0.1–1.0)
Monocytes Relative: 8 %
Neutro Abs: 4.1 10*3/uL (ref 1.7–7.7)
Neutrophils Relative %: 48 %
Platelet Count: 212 10*3/uL (ref 150–400)
RBC: 4.34 MIL/uL (ref 3.87–5.11)
RDW: 12.8 % (ref 11.5–15.5)
WBC Count: 8.4 10*3/uL (ref 4.0–10.5)
nRBC: 0 % (ref 0.0–0.2)

## 2022-10-30 LAB — CMP (CANCER CENTER ONLY)
ALT: 24 U/L (ref 0–44)
AST: 19 U/L (ref 15–41)
Albumin: 4 g/dL (ref 3.5–5.0)
Alkaline Phosphatase: 47 U/L (ref 38–126)
Anion gap: 6 (ref 5–15)
BUN: 6 mg/dL (ref 6–20)
CO2: 25 mmol/L (ref 22–32)
Calcium: 9.2 mg/dL (ref 8.9–10.3)
Chloride: 109 mmol/L (ref 98–111)
Creatinine: 0.64 mg/dL (ref 0.44–1.00)
GFR, Estimated: >60 mL/min (ref >60)
Glucose, Bld: 84 mg/dL (ref 70–99)
Potassium: 3.9 mmol/L (ref 3.5–5.1)
Sodium: 140 mmol/L (ref 135–145)
Total Bilirubin: 0.4 mg/dL (ref 0.3–1.2)
Total Protein: 6.8 g/dL (ref 6.5–8.1)

## 2022-10-30 LAB — LIPID PANEL
Cholesterol: 234 mg/dL — ABNORMAL HIGH (ref 0–200)
HDL: 57 mg/dL (ref >40)
LDL Cholesterol: 154 mg/dL — ABNORMAL HIGH (ref 0–99)
Total CHOL/HDL Ratio: 4.1 RATIO
Triglycerides: 113 mg/dL (ref <150)
VLDL: 23 mg/dL (ref 0–40)

## 2022-10-30 NOTE — Progress Notes (Signed)
[facility] [phone]   [phone]  OFFICE PROGRESS NOTE  Mel[place] Quitter, [place] [address] [name] [address]  DIAGNOSIS: Stage IV (T3, N2, M1c) non-small cell lung cancer, adenocarcinoma with ALK gene translocation presented with multiple bilateral pulmonary nodules and masses as well as small mediastinal and bilateral axillary lymphadenopathy and metastatic disease to the brain diagnosed in March 2021.  PRIOR THERAPY: Alecensa (Alectinib) 600 mg p.o. twice daily.  First dose started on July 23 2019.  Status post 29 months of treatment.  CURRENT THERAPY: Lorlatinib 100 mg p.o. daily.  First dose was December 06, 2021 status post 11 months of treatment.  INTERVAL HISTORY: Sarah Chandler 37 y.o. female returns to the clinic today for follow-up visit.  The patient is feeling fine today with no concerning complaints.  She denied having any current chest pain, shortness of breath, cough or hemoptysis.  She denied having any fever or chills.  She has no nausea, vomiting, diarrhea or constipation.  She has no headache or visual changes.  Her weight has been stable recently.  She is tolerating her treatment with Lorlatinib fairly well.  She is here for evaluation and repeat blood work.   MEDICAL HISTORY: Past Medical History:  Diagnosis Date   nscl ca dx'd 06/2019   Pneumonia 05/08/2019    ALLERGIES:  is allergic to iron.  MEDICATIONS:  Current Outpatient Medications  Medication Sig Dispense Refill   acetaminophen (TYLENOL) 500 MG tablet Take 1,000 mg by mouth every 6 (six) hours as needed for moderate pain or headache.     albuterol (VENTOLIN HFA) 108 (90 Base) MCG/ACT inhaler Inhale 2 puffs into the lungs every 6 (six) hours as needed for wheezing or shortness of breath. 8 g 1   atorvastatin (LIPITOR) 40 MG tablet TAKE 1/2 TABLET BY MOUTH DAILY 45 tablet 2   citalopram (CELEXA) 20 MG tablet Take 1 tablet (20 mg total) by mouth  daily. 90 tablet 1   ergocalciferol (VITAMIN D2) 1.25 MG (50000 UT) capsule Take 1 capsule (50,000 Units total) by mouth 2 (two) times a week. 26 capsule 3   hydrOXYzine (ATARAX/VISTARIL) 25 MG tablet Take 1 tablet (25 mg total) by mouth 3 (three) times daily as needed for anxiety. **NEEDS APT FOR FURTHER REFILL** 30 tablet 0   levothyroxine (SYNTHROID) 100 MCG tablet Take 1 tablet (100 mcg total) by mouth daily. 90 tablet 3   lorlatinib (LORBRENA) 100 MG tablet Take 1 tablet (100 mg total) by mouth daily. Swallow tablets whole. Do not chew, crush or split tablets. 30 tablet 2   No current facility-administered medications for this visit.    SURGICAL HISTORY:  Past Surgical History:  Procedure Laterality Date   BIOPSY  07/03/2019   Procedure: BIOPSY;  Surgeon: Oretha Milch, MD;  Location: [facility] ENDOSCOPY;  Service: Cardiopulmonary;;   BRONCHIAL BRUSHINGS  07/03/2019   Procedure: BRONCHIAL BRUSHINGS;  Surgeon: Oretha Milch, MD;  Location: [facility] ENDOSCOPY;  Service: Cardiopulmonary;;   BRONCHIAL WASHINGS  07/03/2019   Procedure: BRONCHIAL WASHINGS;  Surgeon: Oretha Milch, MD;  Location: [facility] ENDOSCOPY;  Service: Cardiopulmonary;;   NO PAST SURGERIES     TOOTH EXTRACTION     VIDEO BRONCHOSCOPY N/A 07/03/2019   Procedure: VIDEO BRONCHOSCOPY WITH FLUORO;  Surgeon: Oretha Milch, MD;  Location: [facility] ENDOSCOPY;  Service: Cardiopulmonary;  Laterality: N/A;  LMA    REVIEW OF SYSTEMS:  A comprehensive review of systems  was negative.   PHYSICAL EXAMINATION: General appearance: alert, cooperative, and no distress Head: Normocephalic, without obvious abnormality, atraumatic Neck: no adenopathy, no JVD, supple, symmetrical, trachea midline, and thyroid not enlarged, symmetric, no tenderness/mass/nodules Lymph nodes: Cervical, supraclavicular, and axillary nodes normal. Resp: clear to auscultation bilaterally Back: symmetric, no curvature. ROM normal. No CVA tenderness. Cardio: regular rate and rhythm, S1,  S2 normal, no murmur, click, rub or gallop GI: soft, non-tender; bowel sounds normal; no masses,  no organomegaly Extremities: extremities normal, atraumatic, no cyanosis or edema  ECOG PERFORMANCE STATUS: 1 - Symptomatic but completely ambulatory  Blood pressure (!) 138/56, pulse 73, temperature 98.3 F (36.8 C), temperature source Oral, resp. rate 18, height 5\' 10"  (1.778 m), weight (!) 307 lb 14.4 oz (139.7 kg), last menstrual period 09/10/2022, SpO2 100%.  LABORATORY DATA: Lab Results  Component Value Date   WBC 8.4 10/30/2022   HGB 12.5 10/30/2022   HCT 37.5 10/30/2022   MCV 86.4 10/30/2022   PLT 212 10/30/2022      Chemistry      Component Value Date/Time   NA 140 08/27/2022 1312   NA 143 08/28/2021 0811   K 4.3 08/27/2022 1312   CL 107 08/27/2022 1312   CO2 26 08/27/2022 1312   BUN 8 08/27/2022 1312   BUN 8 08/28/2021 0811   CREATININE 0.69 08/27/2022 1312      Component Value Date/Time   CALCIUM 9.4 08/27/2022 1312   ALKPHOS 55 08/27/2022 1312   AST 18 08/27/2022 1312   ALT 22 08/27/2022 1312   BILITOT 0.3 08/27/2022 1312       RADIOGRAPHIC STUDIES: No results found.   ASSESSMENT AND PLAN: This is a very pleasant 37 years old white female recently diagnosed with stage IV non-small cell lung cancer, adenocarcinoma with positive ALK gene translocation in March 2021.  The patient started treatment with Alecensa on July 23, 2019 and within few days she started not significant improvement in her condition with less shortness of breath as well as decrease in supraclavicular lymphadenopathy.  She is currently on treatment with Alecensa 600 mg p.o. twice daily status post 29 months of treatment. The patient was lost to follow-up for the last 6 months but she continues to do fine and she is taking her medication with Alecensa (Alectinib) as prescribed.  Her treatment was discontinued secondary to disease progression. The patient has started treatment with Lorlatinib 100  mg p.o. daily status post 11 months of treatment. The patient has been tolerating her treatment fairly well with no concerning adverse effects. I recommended for her to continue her current treatment with Lorlatinib with the same dose. For the drug induced hypercholesterolemia, she will continue her current treatment with Lipitor and we will adjust the dose as needed. I will see her back for follow-up visit in 2 months for evaluation and repeat lab work as well as CT scan. The patient was advised to call immediately if she has any concerning symptoms in the interval. The patient voices understanding of current disease status and treatment options and is in agreement with the current care plan.  All questions were answered. The patient knows to call the clinic with any problems, questions or concerns. We can certainly see the patient much sooner if necessary. The total time spent in the appointment was 20 minutes.  Disclaimer: This note was dictated with voice recognition software. Similar sounding words can inadvertently be transcribed and may not be corrected upon review.

## 2022-11-09 ENCOUNTER — Other Ambulatory Visit: Payer: Self-pay

## 2022-11-11 ENCOUNTER — Other Ambulatory Visit: Payer: Self-pay

## 2022-11-21 ENCOUNTER — Other Ambulatory Visit (HOSPITAL_COMMUNITY): Payer: Self-pay

## 2022-11-26 ENCOUNTER — Other Ambulatory Visit (HOSPITAL_COMMUNITY): Payer: Self-pay

## 2022-12-12 ENCOUNTER — Ambulatory Visit: Payer: Medicaid Other | Admitting: Family Medicine

## 2022-12-12 ENCOUNTER — Encounter: Payer: Self-pay | Admitting: Family Medicine

## 2022-12-12 VITALS — BP 156/88 | HR 67 | Resp 18 | Ht 70.0 in | Wt 305.0 lb

## 2022-12-12 DIAGNOSIS — Z23 Encounter for immunization: Secondary | ICD-10-CM | POA: Diagnosis not present

## 2022-12-12 DIAGNOSIS — F321 Major depressive disorder, single episode, moderate: Secondary | ICD-10-CM

## 2022-12-12 DIAGNOSIS — R030 Elevated blood-pressure reading, without diagnosis of hypertension: Secondary | ICD-10-CM

## 2022-12-12 DIAGNOSIS — F411 Generalized anxiety disorder: Secondary | ICD-10-CM | POA: Diagnosis not present

## 2022-12-12 MED ORDER — HYDROXYZINE HCL 25 MG PO TABS: 25.0000 mg | ORAL_TABLET | Freq: Three times a day (TID) | ORAL | 0 refills | Status: DC | PRN

## 2022-12-12 MED ORDER — DESVENLAFAXINE SUCCINATE ER 25 MG PO TB24
25.0000 mg | ORAL_TABLET | Freq: Every day | ORAL | 2 refills | Status: DC
Start: 2022-12-12 — End: 2023-01-03

## 2022-12-12 NOTE — Assessment & Plan Note (Signed)
GAD-7 score of 9.  Citalopram has not alleviated mood symptoms at all since initiation even upon increasing the dose.  Switching to SNRI, desvenlafaxine 25 mg daily instead.  We will follow-up in about 6 weeks to assess efficacy.  If necessary, may augment with Wellbutrin or atypical antipsychotic in the future.

## 2022-12-12 NOTE — Assessment & Plan Note (Signed)
PHQ-9 score 4 which has improved from 10 on 10/03/2022.  Patient states that she has not felt any improvement in spite of the scores improving, and endorses that she has been more irritable, had trouble with memory and focus.  Switching from citalopram to desvenlafaxine 25 mg daily.  Follow-up in 6 weeks to assess efficacy.

## 2022-12-12 NOTE — Progress Notes (Signed)
Established Patient Office Visit  Subjective   Patient ID: Sarah Chandler, female    DOB: 1985/12/07  Age: 37 y.o. MRN: [id number]  Chief Complaint  Patient presents with   Anxiety   Depression    HPI Sarah Chandler is a 37 y.o. female presenting today for follow up of mood. Mood: Patient is here to follow up for depression and anxiety, currently managing with citalopram which was increased to 20 mg at last appointment. Taking medication without side effects, reports excellent compliance with treatment. Denies mood changes or SI/HI. She feels mood is  about the same  since last visit.  She does not endorse any improvement in symptoms.  She has noticed increased irritability, difficulty concentrating, impaired memory since last visit.     12/12/2022   10:44 AM 10/03/2022    8:28 AM 08/02/2021    1:19 PM  Depression screen PHQ 2/9  Decreased Interest 0 1 1  Down, Depressed, Hopeless 0 1 0  PHQ - 2 Score 0 2 1  Altered sleeping 0 0 0  Tired, decreased energy 2 3 1   Change in appetite 2 3 0  Feeling bad or failure about yourself  0 0 1  Trouble concentrating 0 2 1  Moving slowly or fidgety/restless 0 0 0  Suicidal thoughts 0 0 0  PHQ-9 Score 4 10 4   Difficult doing work/chores Somewhat difficult Very difficult        12/12/2022   10:44 AM 08/02/2021    1:19 PM 05/11/2021   10:16 AM 05/19/2019   10:18 AM  GAD 7 : Generalized Anxiety Score  Nervous, Anxious, on Edge 2 2 2 1   Control/stop worrying 1 1 2 1   Worry too much - different things 1 1 2 1   Trouble relaxing 1 1 2  0  Restless 1 1 2  0  Easily annoyed or irritable 2 1 2 1   Afraid - awful might happen 1 1 1 1   Total GAD 7 Score 9 8 13 5   Anxiety Difficulty Somewhat difficult   Somewhat difficult   ROS Negative unless otherwise noted in HPI   Objective:     BP (!) 156/88 (BP Location: Left Arm, Patient Position: Sitting, Cuff Size: Large)   Pulse 67   Resp 18   Ht 5\' 10"  (1.778 m)   Wt (!) 305 lb (138.3 kg)   SpO2  96%   BMI 43.76 kg/m   Physical Exam Constitutional:      General: She is not in acute distress.    Appearance: Normal appearance.  HENT:     Head: Normocephalic and atraumatic.  Pulmonary:     Effort: Pulmonary effort is normal. No respiratory distress.  Musculoskeletal:     Cervical back: Normal range of motion.  Neurological:     General: No focal deficit present.     Mental Status: She is alert and oriented to person, place, and time. Mental status is at baseline.  Psychiatric:        Mood and Affect: Mood normal. Affect is flat.        Speech: Speech normal.        Behavior: Behavior normal.        Thought Content: Thought content normal.        Cognition and Memory: Cognition and memory normal.        Judgment: Judgment normal.     Assessment & Plan:  GAD (generalized anxiety disorder) Assessment & Plan: GAD-7 score of 9.  Citalopram has not alleviated mood symptoms at all since initiation even upon increasing the dose.  Switching to SNRI, desvenlafaxine 25 mg daily instead.  We will follow-up in about 6 weeks to assess efficacy.  If necessary, may augment with Wellbutrin or atypical antipsychotic in the future.  Orders: -     Desvenlafaxine Succinate ER; Take 1 tablet (25 mg total) by mouth daily.  Dispense: 30 tablet; Refill: 2 -     hydrOXYzine HCl; Take 1 tablet (25 mg total) by mouth 3 (three) times daily as needed for anxiety. **NEEDS APT FOR FURTHER REFILL**  Dispense: 30 tablet; Refill: 0  Depression, major, single episode, moderate (HCC) Assessment & Plan: PHQ-9 score 4 which has improved from 10 on 10/03/2022.  Patient states that she has not felt any improvement in spite of the scores improving, and endorses that she has been more irritable, had trouble with memory and focus.  Switching from citalopram to desvenlafaxine 25 mg daily.  Follow-up in 6 weeks to assess efficacy.  Orders: -     Desvenlafaxine Succinate ER; Take 1 tablet (25 mg total) by mouth daily.   Dispense: 30 tablet; Refill: 2  Elevated systolic blood pressure reading without diagnosis of hypertension Assessment & Plan: Blood pressure remains elevated, today 156/88.  If blood pressure remains elevated with anxiety under control, will move forward with ambulatory blood pressure log to meet criteria for diagnosis of hypertension.   Need for influenza vaccination -     Flu vaccine trivalent PF, 6mos and older(Flulaval,Afluria,Fluarix,Fluzone)    Return in about 6 weeks (around 01/23/2023) for follow-up for mood, update Tdap.    Melida Quitter, PA

## 2022-12-12 NOTE — Assessment & Plan Note (Signed)
Blood pressure remains elevated, today 156/88.  If blood pressure remains elevated with anxiety under control, will move forward with ambulatory blood pressure log to meet criteria for diagnosis of hypertension.

## 2022-12-18 ENCOUNTER — Other Ambulatory Visit (HOSPITAL_COMMUNITY): Payer: Self-pay

## 2022-12-21 ENCOUNTER — Other Ambulatory Visit (HOSPITAL_COMMUNITY): Payer: Self-pay

## 2022-12-21 ENCOUNTER — Other Ambulatory Visit (HOSPITAL_BASED_OUTPATIENT_CLINIC_OR_DEPARTMENT_OTHER): Payer: Self-pay

## 2022-12-24 ENCOUNTER — Other Ambulatory Visit: Payer: Self-pay | Admitting: Internal Medicine

## 2022-12-24 ENCOUNTER — Inpatient Hospital Stay: Payer: Medicaid Other | Attending: Internal Medicine

## 2022-12-24 ENCOUNTER — Ambulatory Visit (HOSPITAL_COMMUNITY)
Admission: RE | Admit: 2022-12-24 | Discharge: 2022-12-24 | Disposition: A | Discharge status: Home / Self Care | Payer: Medicaid Other | Source: Ambulatory Visit | Attending: Internal Medicine | Admitting: Internal Medicine

## 2022-12-24 DIAGNOSIS — C3490 Malignant neoplasm of unspecified part of unspecified bronchus or lung: Secondary | ICD-10-CM

## 2022-12-24 DIAGNOSIS — C3431 Malignant neoplasm of lower lobe, right bronchus or lung: Secondary | ICD-10-CM | POA: Insufficient documentation

## 2022-12-24 DIAGNOSIS — C7931 Secondary malignant neoplasm of brain: Secondary | ICD-10-CM | POA: Insufficient documentation

## 2022-12-24 DIAGNOSIS — Z85118 Personal history of other malignant neoplasm of bronchus and lung: Secondary | ICD-10-CM | POA: Diagnosis not present

## 2022-12-24 DIAGNOSIS — C7951 Secondary malignant neoplasm of bone: Secondary | ICD-10-CM | POA: Insufficient documentation

## 2022-12-24 DIAGNOSIS — Z79899 Other long term (current) drug therapy: Secondary | ICD-10-CM | POA: Insufficient documentation

## 2022-12-24 LAB — CBC WITH DIFFERENTIAL (CANCER CENTER ONLY)
Abs Immature Granulocytes: 0.01 10*3/uL (ref 0.00–0.07)
Basophils Absolute: 0.1 10*3/uL (ref 0.0–0.1)
Basophils Relative: 1 %
Eosinophils Absolute: 0.3 10*3/uL (ref 0.0–0.5)
Eosinophils Relative: 4 %
HCT: 38.9 % (ref 36.0–46.0)
Hemoglobin: 12.5 g/dL (ref 12.0–15.0)
Immature Granulocytes: 0 %
Lymphocytes Relative: 36 %
Lymphs Abs: 2.9 10*3/uL (ref 0.7–4.0)
MCH: 28.6 pg (ref 26.0–34.0)
MCHC: 32.1 g/dL (ref 30.0–36.0)
MCV: 89 fL (ref 80.0–100.0)
Monocytes Absolute: 0.6 10*3/uL (ref 0.1–1.0)
Monocytes Relative: 8 %
Neutro Abs: 4 10*3/uL (ref 1.7–7.7)
Neutrophils Relative %: 51 %
Platelet Count: 237 10*3/uL (ref 150–400)
RBC: 4.37 MIL/uL (ref 3.87–5.11)
RDW: 12.8 % (ref 11.5–15.5)
WBC Count: 7.9 10*3/uL (ref 4.0–10.5)
nRBC: 0 % (ref 0.0–0.2)

## 2022-12-24 LAB — CMP (CANCER CENTER ONLY)
ALT: 37 U/L (ref 0–44)
AST: 24 U/L (ref 15–41)
Albumin: 4 g/dL (ref 3.5–5.0)
Alkaline Phosphatase: 58 U/L (ref 38–126)
Anion gap: 5 (ref 5–15)
BUN: 5 mg/dL — ABNORMAL LOW (ref 6–20)
CO2: 28 mmol/L (ref 22–32)
Calcium: 9.3 mg/dL (ref 8.9–10.3)
Chloride: 107 mmol/L (ref 98–111)
Creatinine: 0.72 mg/dL (ref 0.44–1.00)
GFR, Estimated: >60 mL/min (ref >60)
Glucose, Bld: 93 mg/dL (ref 70–99)
Potassium: 4 mmol/L (ref 3.5–5.1)
Sodium: 140 mmol/L (ref 135–145)
Total Bilirubin: 0.4 mg/dL (ref 0.3–1.2)
Total Protein: 7.2 g/dL (ref 6.5–8.1)

## 2022-12-24 LAB — LIPID PANEL
Cholesterol: 233 mg/dL — ABNORMAL HIGH (ref 0–200)
HDL: 60 mg/dL (ref >40)
LDL Cholesterol: 147 mg/dL — ABNORMAL HIGH (ref 0–99)
Total CHOL/HDL Ratio: 3.9 ratio
Triglycerides: 130 mg/dL (ref <150)
VLDL: 26 mg/dL (ref 0–40)

## 2022-12-24 MED ORDER — SODIUM CHLORIDE (PF) 0.9 % IJ SOLN
INTRAMUSCULAR | Status: AC
  Filled 2022-12-24: qty 50

## 2022-12-24 MED ORDER — IOHEXOL 300 MG/ML  SOLN
100.0000 mL | Freq: Once | INTRAMUSCULAR | Status: AC | PRN
  Administered 2022-12-24: 100 mL via INTRAVENOUS

## 2023-01-01 ENCOUNTER — Inpatient Hospital Stay (HOSPITAL_BASED_OUTPATIENT_CLINIC_OR_DEPARTMENT_OTHER): Payer: Medicaid Other | Admitting: Internal Medicine

## 2023-01-01 ENCOUNTER — Other Ambulatory Visit (HOSPITAL_COMMUNITY): Payer: Self-pay

## 2023-01-01 ENCOUNTER — Other Ambulatory Visit: Payer: Self-pay

## 2023-01-01 VITALS — BP 138/94 | HR 86 | Temp 98.1°F | Ht 70.0 in | Wt 310.3 lb

## 2023-01-01 DIAGNOSIS — C3431 Malignant neoplasm of lower lobe, right bronchus or lung: Secondary | ICD-10-CM | POA: Diagnosis present

## 2023-01-01 DIAGNOSIS — C3491 Malignant neoplasm of unspecified part of right bronchus or lung: Secondary | ICD-10-CM | POA: Diagnosis not present

## 2023-01-01 DIAGNOSIS — Z79899 Other long term (current) drug therapy: Secondary | ICD-10-CM | POA: Diagnosis not present

## 2023-01-01 DIAGNOSIS — C7931 Secondary malignant neoplasm of brain: Secondary | ICD-10-CM | POA: Diagnosis not present

## 2023-01-01 DIAGNOSIS — C7951 Secondary malignant neoplasm of bone: Secondary | ICD-10-CM | POA: Diagnosis not present

## 2023-01-01 NOTE — Progress Notes (Signed)
[facility] [phone]   [phone]  OFFICE PROGRESS NOTE  Melida Quitter, PA [address] [name] [address]  DIAGNOSIS: Stage IV (T3, N2, M1c) non-small cell lung cancer, adenocarcinoma with ALK gene translocation presented with multiple bilateral pulmonary nodules and masses as well as small mediastinal and bilateral axillary lymphadenopathy and metastatic disease to the brain diagnosed in March 2021.  PRIOR THERAPY: Alecensa (Alectinib) 600 mg p.o. twice daily.  First dose started on July 23 2019.  Status post 29 months of treatment.  CURRENT THERAPY: Lorlatinib 100 mg p.o. daily.  First dose was December 06, 2021 status post 13 months of treatment.  INTERVAL HISTORY: AREESHA TONKINSON 37 y.o. female returns to the clinic today for follow-up visit accompanied by her cousin. Discussed the use of AI scribe software for clinical note transcription with the patient, who gave verbal consent to proceed.  History of Present Illness   Zera Ammann, a 37 year old patient with a history of Stage IV (T3, N2, M1c) non-small cell lung cancer, adenocarcinoma with ALK gene translocation presented with multiple bilateral pulmonary nodules and masses as well as small mediastinal and bilateral axillary lymphadenopathy and metastatic disease to the brain diagnosed in March 2021. She also has thyroid disease, presents with concerns about difficulty controlling body temperature and a recent one episode severe headache this morning, resolved. The patient reports that when exposed to heat, such as being outside, her body temperature seems to increase significantly. The only relief she finds is from taking a cold shower or sitting in front of an air conditioner. She is unsure if this is a side effect of her medication, a psychological issue, or another underlying condition.  In addition to this, the patient experienced a severe headache, which she described  as feeling like a migraine, earlier in the day of the consultation. She also reported a sensation of her blood pressure rising and facial flushing during this episode.  The patient has been on treatment with Lorlatinib for her cancer and reports no side effects from this medication. She also has a history of hyperthyroidism which transitioned to hypothyroidism, for which she is currently taking Synthroid.  The patient has recently experienced a significant personal loss, with the death of her father due to a heart attack. She reports struggling with this loss, which may be contributing to her overall health and well-being.  The patient's weight has fluctuated slightly, with a recent gain of three pounds, which she attributes to wearing heavy clothing at the time of weighing. She has not experienced any recent nausea, vomiting, or diarrhea.  The patient is also considering evaluation for ADHD and has recently started on Pristiq, although she reports not noticing a significant difference in her mood since starting this medication.       MEDICAL HISTORY: Past Medical History:  Diagnosis Date   nscl ca dx'd 06/2019   Pneumonia 05/08/2019    ALLERGIES:  is allergic to iron.  MEDICATIONS:  Current Outpatient Medications  Medication Sig Dispense Refill   acetaminophen (TYLENOL) 500 MG tablet Take 1,000 mg by mouth every 6 (six) hours as needed for moderate pain or headache.     albuterol (VENTOLIN HFA) 108 (90 Base) MCG/ACT inhaler Inhale 2 puffs into the lungs every 6 (six) hours as needed for wheezing or shortness of breath. 8 g 1   atorvastatin (LIPITOR) 40 MG tablet TAKE 1/2 TABLET BY MOUTH DAILY 45 tablet 2  desvenlafaxine (PRISTIQ) 25 MG 24 hr tablet Take 1 tablet (25 mg total) by mouth daily. 30 tablet 2   ergocalciferol (VITAMIN D2) 1.25 MG (50000 UT) capsule Take 1 capsule (50,000 Units total) by mouth 2 (two) times a week. 26 capsule 3   hydrOXYzine (ATARAX) 25 MG tablet Take 1 tablet  (25 mg total) by mouth 3 (three) times daily as needed for anxiety. **NEEDS APT FOR FURTHER REFILL** 30 tablet 0   levothyroxine (SYNTHROID) 100 MCG tablet Take 1 tablet (100 mcg total) by mouth daily. 90 tablet 3   lorlatinib (LORBRENA) 100 MG tablet Take 1 tablet (100 mg total) by mouth daily. Swallow tablets whole. Do not chew, crush or split tablets. 30 tablet 2   No current facility-administered medications for this visit.    SURGICAL HISTORY:  Past Surgical History:  Procedure Laterality Date   BIOPSY  07/03/2019   Procedure: BIOPSY;  Surgeon: Oretha Milch, MD;  Location: [facility] ENDOSCOPY;  Service: Cardiopulmonary;;   BRONCHIAL BRUSHINGS  07/03/2019   Procedure: BRONCHIAL BRUSHINGS;  Surgeon: Oretha Milch, MD;  Location: [facility] ENDOSCOPY;  Service: Cardiopulmonary;;   BRONCHIAL WASHINGS  07/03/2019   Procedure: BRONCHIAL WASHINGS;  Surgeon: Oretha Milch, MD;  Location: [facility] ENDOSCOPY;  Service: Cardiopulmonary;;   TOOTH EXTRACTION     VIDEO BRONCHOSCOPY N/A 07/03/2019   Procedure: VIDEO BRONCHOSCOPY WITH FLUORO;  Surgeon: Oretha Milch, MD;  Location: [facility] ENDOSCOPY;  Service: Cardiopulmonary;  Laterality: N/A;  LMA    REVIEW OF SYSTEMS:  Constitutional: positive for fatigue and night sweats Eyes: negative Ears, nose, mouth, throat, and face: negative Respiratory: negative Cardiovascular: negative Gastrointestinal: negative Genitourinary:negative Integument/breast: negative Hematologic/lymphatic: negative Musculoskeletal:negative Neurological: positive for headaches Behavioral/Psych: positive for mood swings Endocrine: negative Allergic/Immunologic: negative   PHYSICAL EXAMINATION: General appearance: alert, cooperative, fatigued, and no distress Head: Normocephalic, without obvious abnormality, atraumatic Neck: no adenopathy, no JVD, supple, symmetrical, trachea midline, and thyroid not enlarged, symmetric, no tenderness/mass/nodules Lymph nodes: Cervical, supraclavicular,  and axillary nodes normal. Resp: clear to auscultation bilaterally Back: symmetric, no curvature. ROM normal. No CVA tenderness. Cardio: regular rate and rhythm, S1, S2 normal, no murmur, click, rub or gallop GI: soft, non-tender; bowel sounds normal; no masses,  no organomegaly Extremities: extremities normal, atraumatic, no cyanosis or edema Neurologic: Alert and oriented X 3, normal strength and tone. Normal symmetric reflexes. Normal coordination and gait  ECOG PERFORMANCE STATUS: 1 - Symptomatic but completely ambulatory  Blood pressure (!) 138/94, pulse 86, temperature 98.1 F (36.7 C), temperature source Oral, height 5\' 10"  (1.778 m), weight (!) 310 lb 5 oz (140.8 kg), last menstrual period 12/10/2022, SpO2 99%.  LABORATORY DATA: Lab Results  Component Value Date   WBC 7.9 12/24/2022   HGB 12.5 12/24/2022   HCT 38.9 12/24/2022   MCV 89.0 12/24/2022   PLT 237 12/24/2022      Chemistry      Component Value Date/Time   NA 140 12/24/2022 1051   NA 143 08/28/2021 0811   K 4.0 12/24/2022 1051   CL 107 12/24/2022 1051   CO2 28 12/24/2022 1051   BUN 5 (L) 12/24/2022 1051   BUN 8 08/28/2021 0811   CREATININE 0.72 12/24/2022 1051      Component Value Date/Time   CALCIUM 9.3 12/24/2022 1051   ALKPHOS 58 12/24/2022 1051   AST 24 12/24/2022 1051   ALT 37 12/24/2022 1051   BILITOT 0.4 12/24/2022 1051       RADIOGRAPHIC STUDIES: CT CHEST ABDOMEN PELVIS  W CONTRAST  Result Date: 12/24/2022 CLINICAL DATA:  History of stage IV non-small lung cancer, follow-up. * Tracking Code: BO * EXAM: CT CHEST, ABDOMEN, AND PELVIS WITH CONTRAST TECHNIQUE: Multidetector CT imaging of the chest, abdomen and pelvis was performed following the standard protocol during bolus administration of intravenous contrast. RADIATION DOSE REDUCTION: This exam was performed according to the departmental dose-optimization program which includes automated exposure control, adjustment of the mA and/or kV  according to patient size and/or use of iterative reconstruction technique. CONTRAST:  OMNIPAQUE IOHEXOL 300 MG/ML  SOLN COMPARISON:  Multiple priors including most recent CT Aug 27, 2022. FINDINGS: CT CHEST FINDINGS Cardiovascular: Normal caliber thoracic aorta. No central pulmonary embolus on this nondedicated study. Normal size heart. No significant pericardial effusion/thickening. Mediastinum/Nodes: No suspicious thyroid nodule. No pathologically enlarged mediastinal, hilar or axillary lymph nodes. The esophagus is grossly unremarkable. Lungs/Pleura: No significant interval change in the previously indexed and non indexed pulmonary nodules many of which now reflect linear hazy bands of opacity. For reference: -paramedian right upper lobe pulmonary nodule measures 5 x 4 mm on image 45/4 unchanged -stellate right lower lobe pulmonary nodule measures 6 mm on image 118/4, unchanged. There are few new tiny subtle pulmonary nodules for instance in the right lower lobe measuring 4 mm on image 96/4. No pleural effusion.  No pneumothorax. Musculoskeletal: No significant interval change in the multifocal sclerotic osseous foci throughout the axial skeleton involving the sternum, clavicles, scapula, ribs and spine. No new suspicious osseous lesion identified. CT ABDOMEN PELVIS FINDINGS Hepatobiliary: No suspicious hepatic lesion. Gallbladder is unremarkable. No biliary ductal dilation. Pancreas: No pancreatic ductal dilation or evidence of acute inflammation. Spleen: No splenomegaly. Adrenals/Urinary Tract: Bilateral adrenal glands appear normal. No hydronephrosis. Kidneys demonstrate symmetric enhancement. Urinary bladder is unremarkable for degree of distension. Stomach/Bowel: No radiopaque enteric contrast material was administered. Stomach is unremarkable for degree of distension. No pathologic dilation of small or large bowel. Moderate volume of formed stool in the colon. Vascular/Lymphatic: Normal caliber  abdominal aorta. Smooth IVC contours. The portal, splenic and superior mesenteric veins are patent. No pathologically enlarged abdominal or pelvic lymph nodes. Reproductive: Dominant follicle in the right ovary is considered benign requiring no independent imaging follow-up. Uterus is unremarkable in CT appearance for reproductive age female. Other: Trace pelvic free fluid is within physiologic normal limits. Musculoskeletal: No significant interval change in the extensive scattered sclerotic osseous metastatic lesions throughout the axial and proximal appendicular skeleton. IMPRESSION: 1. There are few new tiny subtle scattered pulmonary nodules, nonspecific but warranting attention on short-term interval follow-up dedicated chest CT. 2. No significant interval change in the additional previously indexed and non indexed pulmonary nodules, the majority of which now reflect linear hazy bands of opacity, likely post treatment related. 3. No significant interval change in the extensive scattered sclerotic osseous metastatic lesions throughout the axial and proximal appendicular skeleton. 4. No evidence of soft tissue metastatic disease in the abdomen or pelvis. Electronically Signed   By: Maudry Mayhew M.D.   On: 12/24/2022 16:14     ASSESSMENT AND PLAN: This is a very pleasant 37 years old white female recently diagnosed with stage IV non-small cell lung cancer, adenocarcinoma with positive ALK gene translocation in March 2021.  The patient started treatment with Alecensa on July 23, 2019 and within few days she started not significant improvement in her condition with less shortness of breath as well as decrease in supraclavicular lymphadenopathy.  She is currently on treatment with Alecensa 600  mg p.o. twice daily status post 29 months of treatment. The patient was lost to follow-up for the last 6 months but she continues to do fine and she is taking her medication with Alecensa (Alectinib) as prescribed.  Her  treatment was discontinued secondary to disease progression. The patient has started treatment with Lorlatinib 100 mg p.o. daily status post 13 months of treatment.  She has been tolerating this treatment well with no concerning adverse effects. She had repeat CT scan of the chest, abdomen and pelvis performed recently.  I personally and independently reviewed the scan images and discussed the result with the patient and her cousin today.  Her scan showed no concerning findings for disease progression but there was few new tiny subpleural scattered pulmonary nodules that are nonspecific but were not attention on follow-up imaging studies. Assessment and Plan    Lung Cancer Stable on Lorlatinib with no reported side effects. Noted small nodules on recent scan, not concerning but require monitoring due to history of cancer growth. -Continue Lorlatinib at current dose 100 mg orally daily. -Repeat scan in 3 months to monitor nodules.  Thermoregulation Issues Reports difficulty controlling body temperature, particularly in hot weather, leading to discomfort and irritability. Unclear if related to medication or another underlying issue. -Advise to discuss with endocrinologist, Dr. Lonzo Cloud, for potential hormonal evaluation.  Headache Acute onset of severe headache this morning, described as similar to a migraine. Associated with facial flushing and perceived increase in blood pressure. -Monitor for recurrence or worsening of symptoms.  Hypothyroidism Stable on Synthroid for management of hypothyroidism following initial hyperthyroidism. -Continue Synthroid daily.  Psychiatric Concerns On Pristiq for mood stabilization, but reports no noticeable difference. Also considering evaluation for ADHD. -Continue Pristiq as prescribed. -Consider further psychiatric evaluation for ADHD and mood disorder management.  Follow-up in 6 weeks with repeat blood work.   She was advised to call  immediately if she has any other concerning symptoms in the interval. The patient voices understanding of current disease status and treatment options and is in agreement with the current care plan.  All questions were answered. The patient knows to call the clinic with any problems, questions or concerns. We can certainly see the patient much sooner if necessary. The total time spent in the appointment was 30 minutes.  Disclaimer: This note was dictated with voice recognition software. Similar sounding words can inadvertently be transcribed and may not be corrected upon review.

## 2023-01-03 ENCOUNTER — Other Ambulatory Visit: Payer: Self-pay | Admitting: Family Medicine

## 2023-01-03 DIAGNOSIS — F321 Major depressive disorder, single episode, moderate: Secondary | ICD-10-CM

## 2023-01-03 DIAGNOSIS — F411 Generalized anxiety disorder: Secondary | ICD-10-CM

## 2023-01-15 ENCOUNTER — Other Ambulatory Visit (HOSPITAL_COMMUNITY): Payer: Self-pay | Admitting: Pharmacy Technician

## 2023-01-15 ENCOUNTER — Other Ambulatory Visit: Payer: Self-pay | Admitting: Internal Medicine

## 2023-01-15 ENCOUNTER — Other Ambulatory Visit (HOSPITAL_COMMUNITY): Payer: Self-pay

## 2023-01-15 DIAGNOSIS — C3491 Malignant neoplasm of unspecified part of right bronchus or lung: Secondary | ICD-10-CM

## 2023-01-15 MED ORDER — LORLATINIB 100 MG PO TABS
100.0000 mg | ORAL_TABLET | Freq: Every day | ORAL | 2 refills | Status: DC
Start: 2023-01-15 — End: 2023-04-12
  Filled 2023-01-16: qty 30, 30d supply, fill #0
  Filled 2023-02-14: qty 30, 30d supply, fill #1
  Filled 2023-03-15: qty 30, 30d supply, fill #2

## 2023-01-15 NOTE — Progress Notes (Signed)
Specialty Pharmacy Refill Coordination Note  Sarah Chandler is a 37 y.o. female contacted today regarding refills of specialty medication(s) Lorlatinib   Patient requested [name] at [facility] at [place] date: 01/28/23   Medication will be filled on 01/25/23.   Refill request sent to MD; Call if any delays

## 2023-01-16 ENCOUNTER — Other Ambulatory Visit: Payer: Self-pay

## 2023-01-16 NOTE — Progress Notes (Signed)
Refill received

## 2023-01-25 ENCOUNTER — Other Ambulatory Visit: Payer: Self-pay

## 2023-01-25 ENCOUNTER — Other Ambulatory Visit (HOSPITAL_COMMUNITY): Payer: Self-pay

## 2023-01-31 ENCOUNTER — Ambulatory Visit (INDEPENDENT_AMBULATORY_CARE_PROVIDER_SITE_OTHER): Payer: Medicaid Other | Admitting: Family Medicine

## 2023-01-31 ENCOUNTER — Encounter: Payer: Self-pay | Admitting: Family Medicine

## 2023-01-31 ENCOUNTER — Other Ambulatory Visit: Payer: Self-pay

## 2023-01-31 VITALS — BP 148/82 | HR 79 | Resp 18 | Ht 70.0 in | Wt 315.0 lb

## 2023-01-31 DIAGNOSIS — F321 Major depressive disorder, single episode, moderate: Secondary | ICD-10-CM | POA: Diagnosis not present

## 2023-01-31 DIAGNOSIS — Z6841 Body Mass Index (BMI) 40.0 and over, adult: Secondary | ICD-10-CM | POA: Diagnosis not present

## 2023-01-31 DIAGNOSIS — Z8349 Family history of other endocrine, nutritional and metabolic diseases: Secondary | ICD-10-CM

## 2023-01-31 DIAGNOSIS — F411 Generalized anxiety disorder: Secondary | ICD-10-CM

## 2023-01-31 DIAGNOSIS — R232 Flushing: Secondary | ICD-10-CM

## 2023-01-31 DIAGNOSIS — Z23 Encounter for immunization: Secondary | ICD-10-CM | POA: Diagnosis not present

## 2023-01-31 DIAGNOSIS — Z1159 Encounter for screening for other viral diseases: Secondary | ICD-10-CM | POA: Diagnosis not present

## 2023-01-31 MED ORDER — DESVENLAFAXINE SUCCINATE ER 50 MG PO TB24
50.0000 mg | ORAL_TABLET | Freq: Every day | ORAL | 1 refills | Status: DC
Start: 2023-01-31 — End: 2023-07-29

## 2023-01-31 NOTE — Assessment & Plan Note (Signed)
Increased dose of desvenlafaxine to 50 mg daily.  Follow-up in 4-6 weeks to reassess.

## 2023-01-31 NOTE — Progress Notes (Signed)
Established Patient Office Visit  Subjective   Patient ID: Sarah Chandler, female    DOB: [birth year]  Age: 37 y.o. MRN: [id number]  Chief Complaint  Patient presents with   Anxiety   Depression    HPI Sarah Chandler is a 37 y.o. female presenting today for follow up of mood.  She also endorses that over the past few months she has getting more frequent hot flashes.  She does have a family history of early menopause and would like to check for that, and she is also going to get in touch with her endocrinologist.  She would also like a referral to nutritionist to help with weight management. Mood: Patient is here to follow up for depression and anxiety, currently managing with desvenlafaxine 25 mg daily which was started at last appointment after switching from citalopram. Taking medication without side effects, reports excellent compliance with treatment. Denies mood changes or SI/HI. She feels mood is  unchanged  since last visit.    01/31/2023    8:40 AM 12/12/2022   10:44 AM 10/03/2022    8:28 AM  Depression screen PHQ 2/9  Decreased Interest 0 0 1  Down, Depressed, Hopeless 0 0 1  PHQ - 2 Score 0 0 2  Altered sleeping 1 0 0  Tired, decreased energy 2 2 3   Change in appetite 2 2 3   Feeling bad or failure about yourself  0 0 0  Trouble concentrating 0 0 2  Moving slowly or fidgety/restless 1 0 0  Suicidal thoughts 0 0 0  PHQ-9 Score 6 4 10   Difficult doing work/chores Somewhat difficult Somewhat difficult Very difficult       01/31/2023    8:41 AM 12/12/2022   10:44 AM 08/02/2021    1:19 PM 05/11/2021   10:16 AM  GAD 7 : Generalized Anxiety Score  Nervous, Anxious, on Edge 2 2 2 2   Control/stop worrying 2 1 1 2   Worry too much - different things 2 1 1 2   Trouble relaxing 2 1 1 2   Restless 2 1 1 2   Easily annoyed or irritable 3 2 1 2   Afraid - awful might happen 0 1 1 1   Total GAD 7 Score 13 9 8 13   Anxiety Difficulty Very difficult Somewhat difficult       Outpatient  Medications Prior to Visit  Medication Sig   acetaminophen (TYLENOL) 500 MG tablet Take 1,000 mg by mouth every 6 (six) hours as needed for moderate pain or headache.   albuterol (VENTOLIN HFA) 108 (90 Base) MCG/ACT inhaler Inhale 2 puffs into the lungs every 6 (six) hours as needed for wheezing or shortness of breath.   atorvastatin (LIPITOR) 40 MG tablet TAKE 1/2 TABLET BY MOUTH DAILY   ergocalciferol (VITAMIN D2) 1.25 MG (50000 UT) capsule Take 1 capsule (50,000 Units total) by mouth 2 (two) times a week.   hydrOXYzine (ATARAX) 25 MG tablet Take 1 tablet (25 mg total) by mouth 3 (three) times daily as needed for anxiety. **NEEDS APT FOR FURTHER REFILL**   levothyroxine (SYNTHROID) 100 MCG tablet Take 1 tablet (100 mcg total) by mouth daily.   lorlatinib (LORBRENA) 100 MG tablet Take 1 tablet (100 mg total) by mouth daily. Swallow tablets whole. Do not chew, crush or split tablets.   [DISCONTINUED] desvenlafaxine (PRISTIQ) 25 MG 24 hr tablet TAKE 1 TABLET (25 MG TOTAL) BY MOUTH DAILY.   No facility-administered medications prior to visit.    ROS Negative unless otherwise  noted in HPI   Objective:     BP (!) 148/82 (BP Location: Left Arm, Patient Position: Sitting, Cuff Size: Large)   Pulse 79   Resp 18   Ht 5\' 10"  (1.778 m)   Wt (!) 315 lb (142.9 kg)   SpO2 96%   BMI 45.20 kg/m   Physical Exam Constitutional:      General: She is not in acute distress.    Appearance: Normal appearance.  HENT:     Head: Normocephalic and atraumatic.  Cardiovascular:     Rate and Rhythm: Normal rate and regular rhythm.     Heart sounds: No murmur heard.    No friction rub. No gallop.  Pulmonary:     Effort: Pulmonary effort is normal. No respiratory distress.     Breath sounds: No wheezing, rhonchi or rales.  Skin:    General: Skin is warm and dry.  Neurological:     Mental Status: She is alert and oriented to person, place, and time.     Assessment & Plan:  GAD (generalized anxiety  disorder) Assessment & Plan: Increased dose of desvenlafaxine to 50 mg daily.  Follow-up in 4-6 weeks to reassess.  Orders: -     Desvenlafaxine Succinate ER; Take 1 tablet (50 mg total) by mouth daily.  Dispense: 90 tablet; Refill: 1  Depression, major, single episode, moderate (HCC) Assessment & Plan: Increased dose of desvenlafaxine to 50 mg daily.  Follow-up in 4-6 weeks to reassess.  Orders: -     Desvenlafaxine Succinate ER; Take 1 tablet (50 mg total) by mouth daily.  Dispense: 90 tablet; Refill: 1  Hot flashes -     FSH/LH; Future  Family history of early menopause -     FSH/LH; Future  Screening for viral disease -     Hepatitis C antibody; Future  BMI 45.0-49.9, adult [facility]) -     Referral to Nutrition and Diabetes Services  Need for Tdap vaccination -     Tdap vaccine greater than or equal to 7yo IM    Return in about 6 weeks (around 03/14/2023) for follow-up for mood.    Melida Quitter, PA

## 2023-02-01 LAB — FSH/LH
FSH: 3.9 m[IU]/mL
LH: 5.1 m[IU]/mL

## 2023-02-01 LAB — HEPATITIS C ANTIBODY: Hep C Virus Ab: NONREACTIVE

## 2023-02-05 ENCOUNTER — Encounter: Payer: Self-pay | Admitting: Internal Medicine

## 2023-02-05 ENCOUNTER — Ambulatory Visit (INDEPENDENT_AMBULATORY_CARE_PROVIDER_SITE_OTHER): Payer: Medicaid Other | Admitting: Internal Medicine

## 2023-02-05 VITALS — BP 132/80 | HR 88 | Ht 70.0 in | Wt 312.0 lb

## 2023-02-05 DIAGNOSIS — Z8639 Personal history of other endocrine, nutritional and metabolic disease: Secondary | ICD-10-CM | POA: Diagnosis not present

## 2023-02-05 DIAGNOSIS — E559 Vitamin D deficiency, unspecified: Secondary | ICD-10-CM

## 2023-02-05 DIAGNOSIS — E039 Hypothyroidism, unspecified: Secondary | ICD-10-CM

## 2023-02-05 LAB — TSH: TSH: 2.14 u[IU]/mL (ref 0.35–5.50)

## 2023-02-05 LAB — VITAMIN D 25 HYDROXY (VIT D DEFICIENCY, FRACTURES): VITD: 57.83 ng/mL (ref 30.00–100.00)

## 2023-02-05 NOTE — Patient Instructions (Signed)

## 2023-02-05 NOTE — Progress Notes (Unsigned)
Name: Sarah Chandler  MRN/ DOB: [id number], 07-15-1985    Age/ Sex: 37 y.o., female     PCP: Melida Quitter, PA   Reason for Endocrinology Evaluation: Hyperthyroidism     Initial Endocrinology Clinic Visit: 06/08/2019    PATIENT IDENTIFIER: Sarah Chandler is a 37 y.o., female with a past medical history of hyperthyroidism and metastatic non-small cell carcinoma (06/2019). She has followed with [facility] since 06/08/2019 for consultative assistance with management of her hyperthyroidism.   HISTORICAL SUMMARY:  Pt presented to the ED on 05/10/2019 with palpitations and fatigue. She was noted to have a suppressed TSH < 0.005 uIU/mL with elevated FT4 at 1.70 ng/dL .She was started on Methimazole at the time.   In the meantime she was diagnosed with metastatic lung ca and was on Alectinib. TSH trended up to 91 uIU/mL by 09/2019 and methimazole was stopped.   LT-4 replacement was started 12/2019  No FH of thyroid disease SUBJECTIVE:   Today (02/05/2023):  Sarah Chandler is here for a follow up on hypothyroidism    Patient continues to follow-up with oncology for metastatic lung cancer.  She is currently on Lorlatinib.  Lost father in July, 2024  Weight continues to fluctuate Denies local neck swelling  Denis constipation or diarrhea  Denies palpitations  Denies tremors  Continues with fatigue and low energy  Has noted heat intolerance    Levothyroxine 100 mcg daily  Ergocalciferol 50, 000 iu twice weekly  ( MOndays )     HISTORY:  Past Medical History:  Past Medical History:  Diagnosis Date   nscl ca dx'd 06/2019   Pneumonia 05/08/2019   Past Surgical History:  Past Surgical History:  Procedure Laterality Date   BIOPSY  07/03/2019   Procedure: BIOPSY;  Surgeon: Oretha Milch, MD;  Location: [facility] ENDOSCOPY;  Service: Cardiopulmonary;;   BRONCHIAL BRUSHINGS  07/03/2019   Procedure: BRONCHIAL BRUSHINGS;  Surgeon: Oretha Milch, MD;  Location: [facility]  ENDOSCOPY;  Service: Cardiopulmonary;;   BRONCHIAL WASHINGS  07/03/2019   Procedure: BRONCHIAL WASHINGS;  Surgeon: Oretha Milch, MD;  Location[facility] ENDOSCOPY;  Service: Cardiopulmonary;;   TOOTH EXTRACTION     VIDEO BRONCHOSCOPY N/A 07/03/2019   Procedure: VIDEO BRONCHOSCOPY WITH FLUORO;  Surgeon: Oretha Milch, MD;  Location: [facility] ENDOSCOPY;  Service: Cardiopulmonary;  Laterality: N/A;  LMA   Social History:  reports that she has never smoked. She has never been exposed to tobacco smoke. She has never used smokeless tobacco. She reports current alcohol use. She reports that she does not use drugs. Family History:  Family History  Problem Relation Age of Onset   Diabetes Father    Hyperlipidemia Father    Hypertension Father    Depression Maternal Uncle    Diabetes Maternal Uncle    Cancer Maternal Grandmother    Hyperlipidemia Maternal Grandmother    Cancer Paternal Grandfather      HOME MEDICATIONS: Allergies as of 02/05/2023       Reactions   Iron Other (See Comments)   Ferrosol - Liquid  Unknown reaction         Medication List        Accurate as of February 05, 2023  1:59 PM. If you have any questions, ask your nurse or doctor.          acetaminophen 500 MG tablet Commonly known as: TYLENOL Take 1,000 mg by mouth every 6 (six) hours as needed for moderate pain or  headache.   albuterol 108 (90 Base) MCG/ACT inhaler Commonly known as: VENTOLIN HFA Inhale 2 puffs into the lungs every 6 (six) hours as needed for wheezing or shortness of breath.   atorvastatin 40 MG tablet Commonly known as: LIPITOR TAKE 1/2 TABLET BY MOUTH DAILY   desvenlafaxine 50 MG 24 hr tablet Commonly known as: PRISTIQ Take 1 tablet (50 mg total) by mouth daily.   ergocalciferol 1.25 MG (50000 UT) capsule Commonly known as: VITAMIN D2 Take 1 capsule (50,000 Units total) by mouth 2 (two) times a week.   hydrOXYzine 25 MG tablet Commonly known as: ATARAX Take 1 tablet (25 mg total)  by mouth 3 (three) times daily as needed for anxiety. **NEEDS APT FOR FURTHER REFILL**   levothyroxine 100 MCG tablet Commonly known as: SYNTHROID Take 1 tablet (100 mcg total) by mouth daily.   Lorbrena 100 MG tablet Generic drug: lorlatinib Take 1 tablet (100 mg total) by mouth daily. Swallow tablets whole. Do not chew, crush or split tablets.          OBJECTIVE:   PHYSICAL EXAM: VS: BP 132/80 (BP Location: Right Arm, Patient Position: Sitting, Cuff Size: Large)   Pulse 88   Ht 5\' 10"  (1.778 m)   Wt (!) 312 lb (141.5 kg)   SpO2 90%   BMI 44.77 kg/m    EXAM: General: Pt appears well and is in NAD  Neck: General: Supple without adenopathy. Thyroid: Thyroid size normal.  No goiter or nodules appreciated.   Lungs: Clear with good BS bilat   Heart: Auscultation: RRR.  Extremities:  BL LE: No pretibial edema normal ROM and strength.     DATA REVIEWED:  ****     Results for Sarah Chandler, Sarah Chandler (MRN [id number]) as of 12/25/2019 09:03  Ref. Range 06/08/2019 14:52  TRAB Latest Ref Range: <=2.00 IU/L 5.19 (H)   ASSESSMENT / PLAN / RECOMMENDATIONS:   Hypothyroidism :    - She was initially diagnosed with hyperthyroidism secondary to Graves' disease in 04/2019, she had to be started on Alectinib in 07/2019 for metastatic lung cancer and by 09/2019 she was off Methimazole followed by hypothyroidism .  - No local neck symptoms  - TSH ***   Medication   Levothyroxine 100 mcg daily    2. Hx of Graves' Disease:  -No extrathyroidal manifestations of Graves' disease    3. Vitamin D deficiency:  - Repeat Vitamin D is normal  Medication   - Continue ergocalciferol 50,000 iu twice weekly     F/U in 1 year    Signed electronically by: Lyndle Herrlich, MD  [facility]  [facility] [address] [place] [place], [place] [address] Phone: [phone] FAX: [phone]      CC: Melida Quitter, PA [address]  [name] [address] Phone: [phone]  Fax: [phone]   Return to Endocrinology clinic as below: Future Appointments  Date Time Provider Department Center  02/05/2023  2:00 PM Sarah Chandler, Sarah Dolores, MD [facility] None  02/12/2023  8:00 AM [facility] None  02/12/2023  8:30 AM Si Gaul, MD [facility] None  07/31/2023  7:50 AM Wanisha Shiroma, Sarah Dolores, MD [facility] None

## 2023-02-06 MED ORDER — LEVOTHYROXINE SODIUM 100 MCG PO TABS: 100.0000 ug | ORAL_TABLET | Freq: Every day | ORAL | 3 refills | Status: DC

## 2023-02-06 MED ORDER — ERGOCALCIFEROL 1.25 MG (50000 UT) PO CAPS: 50000.0000 [IU] | ORAL_CAPSULE | ORAL | 3 refills | Status: DC

## 2023-02-07 ENCOUNTER — Other Ambulatory Visit: Payer: Self-pay

## 2023-02-12 ENCOUNTER — Inpatient Hospital Stay: Payer: Medicaid Other | Attending: Internal Medicine

## 2023-02-12 ENCOUNTER — Inpatient Hospital Stay (HOSPITAL_BASED_OUTPATIENT_CLINIC_OR_DEPARTMENT_OTHER): Payer: Medicaid Other | Admitting: Internal Medicine

## 2023-02-12 VITALS — BP 134/88 | HR 74 | Temp 98.1°F | Resp 16 | Ht 70.0 in | Wt 312.9 lb

## 2023-02-12 DIAGNOSIS — C7931 Secondary malignant neoplasm of brain: Secondary | ICD-10-CM | POA: Insufficient documentation

## 2023-02-12 DIAGNOSIS — C3490 Malignant neoplasm of unspecified part of unspecified bronchus or lung: Secondary | ICD-10-CM | POA: Diagnosis not present

## 2023-02-12 DIAGNOSIS — C3431 Malignant neoplasm of lower lobe, right bronchus or lung: Secondary | ICD-10-CM | POA: Insufficient documentation

## 2023-02-12 DIAGNOSIS — Z79899 Other long term (current) drug therapy: Secondary | ICD-10-CM | POA: Diagnosis not present

## 2023-02-12 DIAGNOSIS — C3491 Malignant neoplasm of unspecified part of right bronchus or lung: Secondary | ICD-10-CM

## 2023-02-12 LAB — CMP (CANCER CENTER ONLY)
ALT: 27 U/L (ref 0–44)
AST: 20 U/L (ref 15–41)
Albumin: 4.1 g/dL (ref 3.5–5.0)
Alkaline Phosphatase: 62 U/L (ref 38–126)
Anion gap: 7 (ref 5–15)
BUN: 10 mg/dL (ref 6–20)
CO2: 26 mmol/L (ref 22–32)
Calcium: 9.3 mg/dL (ref 8.9–10.3)
Chloride: 107 mmol/L (ref 98–111)
Creatinine: 0.67 mg/dL (ref 0.44–1.00)
GFR, Estimated: >60 mL/min (ref >60)
Glucose, Bld: 92 mg/dL (ref 70–99)
Potassium: 3.9 mmol/L (ref 3.5–5.1)
Sodium: 140 mmol/L (ref 135–145)
Total Bilirubin: 0.4 mg/dL (ref <1.2)
Total Protein: 7.4 g/dL (ref 6.5–8.1)

## 2023-02-12 LAB — CBC WITH DIFFERENTIAL (CANCER CENTER ONLY)
Abs Immature Granulocytes: 0.01 10*3/uL (ref 0.00–0.07)
Basophils Absolute: 0.1 10*3/uL (ref 0.0–0.1)
Basophils Relative: 1 %
Eosinophils Absolute: 0.3 10*3/uL (ref 0.0–0.5)
Eosinophils Relative: 4 %
HCT: 37.8 % (ref 36.0–46.0)
Hemoglobin: 12.5 g/dL (ref 12.0–15.0)
Immature Granulocytes: 0 %
Lymphocytes Relative: 39 %
Lymphs Abs: 3.3 10*3/uL (ref 0.7–4.0)
MCH: 28.6 pg (ref 26.0–34.0)
MCHC: 33.1 g/dL (ref 30.0–36.0)
MCV: 86.5 fL (ref 80.0–100.0)
Monocytes Absolute: 0.7 10*3/uL (ref 0.1–1.0)
Monocytes Relative: 8 %
Neutro Abs: 4.1 10*3/uL (ref 1.7–7.7)
Neutrophils Relative %: 48 %
Platelet Count: 283 10*3/uL (ref 150–400)
RBC: 4.37 MIL/uL (ref 3.87–5.11)
RDW: 12.6 % (ref 11.5–15.5)
WBC Count: 8.4 10*3/uL (ref 4.0–10.5)
nRBC: 0 % (ref 0.0–0.2)

## 2023-02-12 LAB — LACTATE DEHYDROGENASE: LDH: 226 U/L — ABNORMAL HIGH (ref 98–192)

## 2023-02-12 MED ORDER — ROSUVASTATIN CALCIUM 20 MG PO TABS: 20.0000 mg | ORAL_TABLET | Freq: Every day | ORAL | 2 refills | Status: DC

## 2023-02-12 NOTE — Progress Notes (Signed)
Sarah Chandler   [phone]  OFFICE PROGRESS NOTE  Sarah Quitter, PA [address] [name] [address]  DIAGNOSIS: Stage IV (T3, N2, M1c) non-small cell lung cancer, adenocarcinoma with ALK gene translocation presented with multiple bilateral pulmonary nodules and masses as well as small mediastinal and bilateral axillary lymphadenopathy and metastatic disease to the brain diagnosed in March 2021.  PRIOR THERAPY: Alecensa (Alectinib) 600 mg p.o. twice daily.  First dose started on July 23 2019.  Status post 29 months of treatment.  CURRENT THERAPY: Lorlatinib 100 mg p.o. daily.  First dose was December 06, 2021 status post 15 months of treatment.  INTERVAL HISTORY: Sarah Chandler 37 y.o. female returns to the clinic today for follow-up visit accompanied by her cousin.Discussed the use of AI scribe software for clinical note transcription with the patient, who gave verbal consent to proceed.  History of Present Illness   Sarah Chandler, a 37 year old with a history of stage four non-small cell lung cancer adenocarcinoma, was diagnosed in June 2020. The patient has the ALK gene translocation and was initially treated with Alectinib. After approximately 29 months , due to cancer progression, the treatment was switched to Lorlatinib. The patient has been on Lorlatinib for about 15 months.  The patient reports no new complaints since the last visit and denies experiencing chest pain, shortness of breath, cough, nausea, vomiting, diarrhea, headaches, or changes in vision. There are no reported side effects from Lorlatinib.  The patient has been coping with a recent loss in the family, which has been challenging. Despite this, the patient has been engaging in some online activities and coding, but has not been doing much art, which was previously a form of emotional release.         MEDICAL HISTORY: Past Medical History:   Diagnosis Date   nscl ca dx'd 06/2019   Pneumonia 05/08/2019    ALLERGIES:  is allergic to iron.  MEDICATIONS:  Current Outpatient Medications  Medication Sig Dispense Refill   acetaminophen (TYLENOL) 500 MG tablet Take 1,000 mg by mouth every 6 (six) hours as needed for moderate pain or headache.     albuterol (VENTOLIN HFA) 108 (90 Base) MCG/ACT inhaler Inhale 2 puffs into the lungs every 6 (six) hours as needed for wheezing or shortness of breath. 8 g 1   atorvastatin (LIPITOR) 40 MG tablet TAKE 1/2 TABLET BY MOUTH DAILY 45 tablet 2   desvenlafaxine (PRISTIQ) 50 MG 24 hr tablet Take 1 tablet (50 mg total) by mouth daily. 90 tablet 1   ergocalciferol (VITAMIN D2) 1.25 MG (50000 UT) capsule Take 1 capsule (50,000 Units total) by mouth 2 (two) times a week. 26 capsule 3   hydrOXYzine (ATARAX) 25 MG tablet Take 1 tablet (25 mg total) by mouth 3 (three) times daily as needed for anxiety. **NEEDS APT FOR FURTHER REFILL** 30 tablet 0   levothyroxine (SYNTHROID) 100 MCG tablet Take 1 tablet (100 mcg total) by mouth daily. 90 tablet 3   lorlatinib (LORBRENA) 100 MG tablet Take 1 tablet (100 mg total) by mouth daily. Swallow tablets whole. Do not chew, crush or split tablets. 30 tablet 2   No current facility-administered medications for this visit.    SURGICAL HISTORY:  Past Surgical History:  Procedure Laterality Date   BIOPSY  07/03/2019   Procedure: BIOPSY;  Surgeon: Oretha Milch, MD;  Location[facility] ENDOSCOPY;  Service: Cardiopulmonary;;   BRONCHIAL BRUSHINGS  07/03/2019   Procedure: BRONCHIAL BRUSHINGS;  Surgeon: Oretha Milch, MD;  Location: [facility] ENDOSCOPY;  Service: Cardiopulmonary;;   BRONCHIAL WASHINGS  07/03/2019   Procedure: BRONCHIAL WASHINGS;  Surgeon: Oretha Milch, MD;  Location: [facility] ENDOSCOPY;  Service: Cardiopulmonary;;   TOOTH EXTRACTION     VIDEO BRONCHOSCOPY N/A 07/03/2019   Procedure: VIDEO BRONCHOSCOPY WITH FLUORO;  Surgeon: Oretha Milch, MD;  Location: [facility]  ENDOSCOPY;  Service: Cardiopulmonary;  Laterality: N/A;  LMA    REVIEW OF SYSTEMS:  A comprehensive review of systems was negative except for: Constitutional: positive for fatigue   PHYSICAL EXAMINATION: General appearance: alert, cooperative, fatigued, and no distress Head: Normocephalic, without obvious abnormality, atraumatic Neck: no adenopathy, no JVD, supple, symmetrical, trachea midline, and thyroid not enlarged, symmetric, no tenderness/mass/nodules Lymph nodes: Cervical, supraclavicular, and axillary nodes normal. Resp: clear to auscultation bilaterally Back: symmetric, no curvature. ROM normal. No CVA tenderness. Cardio: regular rate and rhythm, S1, S2 normal, no murmur, click, rub or gallop GI: soft, non-tender; bowel sounds normal; no masses,  no organomegaly Extremities: extremities normal, atraumatic, no cyanosis or edema  ECOG PERFORMANCE STATUS: 1 - Symptomatic but completely ambulatory  Blood pressure 134/88, pulse 74, temperature 98.1 F (36.7 C), temperature source Oral, resp. rate 16, height 5\' 10"  (1.778 m), weight (!) 312 lb 14.4 oz (141.9 kg), SpO2 100%.  LABORATORY DATA: Lab Results  Component Value Date   WBC 8.4 02/12/2023   HGB 12.5 02/12/2023   HCT 37.8 02/12/2023   MCV 86.5 02/12/2023   PLT 283 02/12/2023      Chemistry      Component Value Date/Time   NA 140 12/24/2022 1051   NA 143 08/28/2021 0811   K 4.0 12/24/2022 1051   CL 107 12/24/2022 1051   CO2 28 12/24/2022 1051   BUN 5 (L) 12/24/2022 1051   BUN 8 08/28/2021 0811   CREATININE 0.72 12/24/2022 1051      Component Value Date/Time   CALCIUM 9.3 12/24/2022 1051   ALKPHOS 58 12/24/2022 1051   AST 24 12/24/2022 1051   ALT 37 12/24/2022 1051   BILITOT 0.4 12/24/2022 1051       RADIOGRAPHIC STUDIES: No results found.   ASSESSMENT AND PLAN: This is a very pleasant 37 years old white female recently diagnosed with stage IV non-small cell lung cancer, adenocarcinoma with positive ALK  gene translocation in March 2021.  The patient started treatment with Alecensa on July 23, 2019 and within few days she started not significant improvement in her condition with less shortness of breath as well as decrease in supraclavicular lymphadenopathy.  She is currently on treatment with Alecensa 600 mg p.o. twice daily status post 29 months of treatment. The patient was lost to follow-up for the last 6 months but she continues to do fine and she is taking her medication with Alecensa (Alectinib) as prescribed.  Her treatment was discontinued secondary to disease progression. The patient has started treatment with Lorlatinib 100 mg p.o. daily status post 15 months of treatment.  She has been tolerating this treatment well except for fatigue.    Stage 4 Non-Small Cell Lung Cancer (Adenocarcinoma) with ALK gene translocation Stable on Lorlatinib for approximately 15 months after progression on Alectinib. No new symptoms or side effects reported. -Continue Lorlatinib. -Order imaging in two months with follow-up appointment one week after scan.  Hyperlipidemia Currently on Lipitor, which may interact with Lorlatinib. Discussed switching to Crestor for better control and to avoid potential drug interaction. -  Discontinue Lipitor. -Start Crestor 20mg  daily. -Send new prescription to pharmacy.  General Health Maintenance / Followup Plans -Return for follow-up in two months with imaging one week prior to appointment.   She was advised to call immediately if she has any other concerning symptoms in the interval.  The patient voices understanding of current disease status and treatment options and is in agreement with the current care plan.  All questions were answered. The patient knows to call the clinic with any problems, questions or concerns. We can certainly see the patient much sooner if necessary. The total time spent in the appointment was 20 minutes.  Disclaimer: This note was dictated  with voice recognition software. Similar sounding words can inadvertently be transcribed and may not be corrected upon review.

## 2023-02-14 ENCOUNTER — Other Ambulatory Visit: Payer: Self-pay

## 2023-02-14 NOTE — Progress Notes (Signed)
Specialty Pharmacy Refill Coordination Note  Sarah Chandler is a 37 y.o. female contacted today regarding refills of specialty medication(s) Lorlatinib   Patient requested Sarah Chandler at [facility] at [place] date: 02/25/23   Medication will be filled on 02/22/23.

## 2023-02-22 ENCOUNTER — Other Ambulatory Visit: Payer: Self-pay

## 2023-02-25 ENCOUNTER — Other Ambulatory Visit: Payer: Self-pay

## 2023-02-26 ENCOUNTER — Other Ambulatory Visit (HOSPITAL_COMMUNITY): Payer: Self-pay

## 2023-03-15 ENCOUNTER — Other Ambulatory Visit: Payer: Self-pay

## 2023-03-15 NOTE — Progress Notes (Signed)
Specialty Pharmacy Ongoing Clinical Assessment Note  Sarah Chandler is a 37 y.o. female who is being followed by the specialty pharmacy service for RxSp Oncology   Patient's specialty medication(s) reviewed today: Lorlatinib   Missed doses in the last 4 weeks: 0   Patient/Caregiver did not have any additional questions or concerns.   Therapeutic benefit summary: Patient is achieving benefit (Chart notes disease currently stable for past 15 months)   Adverse events/side effects summary: No adverse events/side effects   Patient's therapy is appropriate to: Continue    Goals Addressed             This Visit's Progress    Stabilization of disease       Patient is on track. Patient will maintain adherence         Follow up:  6 months  Bobette Mo Specialty Pharmacist

## 2023-03-15 NOTE — Progress Notes (Signed)
Specialty Pharmacy Refill Coordination Note  Sarah Chandler is a 37 y.o. female contacted today regarding refills of specialty medication(s) Lorlatinib   Patient requested [name] at [facility] at [place] date: 03/22/23   Medication will be filled on 03/21/23.

## 2023-03-21 ENCOUNTER — Other Ambulatory Visit: Payer: Self-pay

## 2023-03-26 ENCOUNTER — Other Ambulatory Visit: Payer: Self-pay

## 2023-03-26 ENCOUNTER — Other Ambulatory Visit (HOSPITAL_COMMUNITY): Payer: Self-pay

## 2023-04-08 ENCOUNTER — Inpatient Hospital Stay: Payer: Medicaid Other | Attending: Internal Medicine

## 2023-04-08 ENCOUNTER — Other Ambulatory Visit: Payer: Self-pay | Admitting: Internal Medicine

## 2023-04-08 ENCOUNTER — Ambulatory Visit (HOSPITAL_COMMUNITY)
Admission: RE | Admit: 2023-04-08 | Discharge: 2023-04-08 | Disposition: A | Discharge status: Home / Self Care | Payer: Medicaid Other | Source: Ambulatory Visit | Attending: Internal Medicine | Admitting: Internal Medicine

## 2023-04-08 DIAGNOSIS — R918 Other nonspecific abnormal finding of lung field: Secondary | ICD-10-CM | POA: Diagnosis not present

## 2023-04-08 DIAGNOSIS — C3490 Malignant neoplasm of unspecified part of unspecified bronchus or lung: Secondary | ICD-10-CM

## 2023-04-08 DIAGNOSIS — C3431 Malignant neoplasm of lower lobe, right bronchus or lung: Secondary | ICD-10-CM | POA: Insufficient documentation

## 2023-04-08 DIAGNOSIS — C7800 Secondary malignant neoplasm of unspecified lung: Secondary | ICD-10-CM | POA: Diagnosis not present

## 2023-04-08 DIAGNOSIS — J929 Pleural plaque without asbestos: Secondary | ICD-10-CM | POA: Diagnosis not present

## 2023-04-08 DIAGNOSIS — K5730 Diverticulosis of large intestine without perforation or abscess without bleeding: Secondary | ICD-10-CM | POA: Diagnosis not present

## 2023-04-08 LAB — LIPID PANEL
Cholesterol: 199 mg/dL (ref 0–200)
HDL: 66 mg/dL (ref >40)
LDL Cholesterol: 102 mg/dL — ABNORMAL HIGH (ref 0–99)
Total CHOL/HDL Ratio: 3 {ratio}
Triglycerides: 154 mg/dL — ABNORMAL HIGH (ref <150)
VLDL: 31 mg/dL (ref 0–40)

## 2023-04-08 LAB — CMP (CANCER CENTER ONLY)
ALT: 28 U/L (ref 0–44)
AST: 22 U/L (ref 15–41)
Albumin: 4.3 g/dL (ref 3.5–5.0)
Alkaline Phosphatase: 63 U/L (ref 38–126)
Anion gap: 11 (ref 5–15)
BUN: 12 mg/dL (ref 6–20)
CO2: 29 mmol/L (ref 22–32)
Calcium: 9.8 mg/dL (ref 8.9–10.3)
Chloride: 105 mmol/L (ref 98–111)
Creatinine: 0.66 mg/dL (ref 0.44–1.00)
GFR, Estimated: >60 mL/min (ref >60)
Glucose, Bld: 89 mg/dL (ref 70–99)
Potassium: 4 mmol/L (ref 3.5–5.1)
Sodium: 145 mmol/L (ref 135–145)
Total Bilirubin: 0.3 mg/dL (ref 0.0–1.2)
Total Protein: 7.8 g/dL (ref 6.5–8.1)

## 2023-04-08 LAB — CBC WITH DIFFERENTIAL (CANCER CENTER ONLY)
Abs Immature Granulocytes: 0.02 10*3/uL (ref 0.00–0.07)
Basophils Absolute: 0.1 10*3/uL (ref 0.0–0.1)
Basophils Relative: 1 %
Eosinophils Absolute: 0.4 10*3/uL (ref 0.0–0.5)
Eosinophils Relative: 3 %
HCT: 39.7 % (ref 36.0–46.0)
Hemoglobin: 13 g/dL (ref 12.0–15.0)
Immature Granulocytes: 0 %
Lymphocytes Relative: 33 %
Lymphs Abs: 3.5 10*3/uL (ref 0.7–4.0)
MCH: 28.6 pg (ref 26.0–34.0)
MCHC: 32.7 g/dL (ref 30.0–36.0)
MCV: 87.3 fL (ref 80.0–100.0)
Monocytes Absolute: 0.9 10*3/uL (ref 0.1–1.0)
Monocytes Relative: 8 %
Neutro Abs: 5.8 10*3/uL (ref 1.7–7.7)
Neutrophils Relative %: 55 %
Platelet Count: 297 10*3/uL (ref 150–400)
RBC: 4.55 MIL/uL (ref 3.87–5.11)
RDW: 12.7 % (ref 11.5–15.5)
WBC Count: 10.6 10*3/uL — ABNORMAL HIGH (ref 4.0–10.5)
nRBC: 0 % (ref 0.0–0.2)

## 2023-04-08 MED ORDER — IOHEXOL 300 MG/ML  SOLN
100.0000 mL | Freq: Once | INTRAMUSCULAR | Status: AC | PRN
Start: 2023-04-08 — End: 2023-04-08
  Administered 2023-04-08: 100 mL via INTRAVENOUS

## 2023-04-09 ENCOUNTER — Other Ambulatory Visit: Payer: Self-pay

## 2023-04-11 ENCOUNTER — Other Ambulatory Visit (HOSPITAL_COMMUNITY): Payer: Self-pay

## 2023-04-11 ENCOUNTER — Other Ambulatory Visit: Payer: Self-pay

## 2023-04-11 NOTE — Progress Notes (Signed)
 Clinical Intervention Note  Clinical Intervention Notes: Patient's mother called, patient has had overnight episodes of diarrhea and thinks she either had food poisoning or norovirus. She asked about taking activated charcoal. I advised that it is NOT recommended to take activated charcoal, but that she should try to maintain adequate hydration and once she feels up to eating start with the BRAT diet foods. She reported some dark stools. I advised if stools appear black and tarry to contact her provider or go to the ER right away. She was understanding and all questions were answered.   Clinical Intervention Outcomes: Prevention of hospitalization   Silvano LOISE Dolly Specialty Pharmacist

## 2023-04-12 ENCOUNTER — Other Ambulatory Visit: Payer: Self-pay | Admitting: Internal Medicine

## 2023-04-12 ENCOUNTER — Other Ambulatory Visit: Payer: Self-pay

## 2023-04-12 DIAGNOSIS — C3491 Malignant neoplasm of unspecified part of right bronchus or lung: Secondary | ICD-10-CM

## 2023-04-12 NOTE — Progress Notes (Signed)
 Specialty Pharmacy Refill Coordination Note  Sarah Chandler is a 38 y.o. female contacted today regarding refills of specialty medication(s) Lorlatinib  (LORBRENA )   Patient requested Marylyn at [facility] at [place] date: 04/19/23   Medication will be filled on 04/19/23 pending a refill request.

## 2023-04-13 ENCOUNTER — Encounter: Payer: Self-pay | Admitting: Internal Medicine

## 2023-04-13 MED ORDER — LORLATINIB 100 MG PO TABS
100.0000 mg | ORAL_TABLET | Freq: Every day | ORAL | 2 refills | Status: DC
  Filled 2023-04-15: qty 30, 30d supply, fill #0
  Filled 2023-05-14: qty 30, 30d supply, fill #1
  Filled 2023-06-11: qty 30, 30d supply, fill #2

## 2023-04-15 ENCOUNTER — Other Ambulatory Visit: Payer: Self-pay

## 2023-04-15 ENCOUNTER — Encounter (INDEPENDENT_AMBULATORY_CARE_PROVIDER_SITE_OTHER): Payer: Medicaid Other | Admitting: Adult Health

## 2023-04-15 ENCOUNTER — Inpatient Hospital Stay: Payer: Medicaid Other | Attending: Internal Medicine | Admitting: Internal Medicine

## 2023-04-15 VITALS — BP 143/76 | HR 91 | Temp 98.3°F | Resp 18 | Ht 70.0 in | Wt 307.5 lb

## 2023-04-15 DIAGNOSIS — C3431 Malignant neoplasm of lower lobe, right bronchus or lung: Secondary | ICD-10-CM | POA: Insufficient documentation

## 2023-04-15 DIAGNOSIS — C7931 Secondary malignant neoplasm of brain: Secondary | ICD-10-CM | POA: Diagnosis not present

## 2023-04-15 DIAGNOSIS — Z79899 Other long term (current) drug therapy: Secondary | ICD-10-CM | POA: Diagnosis not present

## 2023-04-15 DIAGNOSIS — C3491 Malignant neoplasm of unspecified part of right bronchus or lung: Secondary | ICD-10-CM | POA: Diagnosis not present

## 2023-04-15 NOTE — Progress Notes (Signed)
 [facility] [phone]   [phone]  OFFICE PROGRESS NOTE  [name] LABOR, PA [address] [name] [address]  DIAGNOSIS: Stage IV (T3, N2, M1c) non-small cell lung cancer, adenocarcinoma with ALK gene translocation presented with multiple bilateral pulmonary nodules and masses as well as small mediastinal and bilateral axillary lymphadenopathy and metastatic disease to the brain diagnosed in March 2021.  PRIOR THERAPY: Alecensa  (Alectinib) 600 mg p.o. twice daily.  First dose started on July 23 2019.  Status post 29 months of treatment.  CURRENT THERAPY: Lorlatinib  100 mg p.o. daily.  First dose was December 06, 2021 status post 16 months of treatment.  INTERVAL HISTORY: Sarah Chandler 38 y.o. female returns to the clinic today for follow-up visit. Discussed the use of AI scribe software for clinical note transcription with the patient, who gave verbal consent to proceed.  History of Present Illness   Sarah Chandler, a 38 year old patient, was diagnosed with stage four non-small cell lung cancer adenocarcinoma with ALK gene translocation in March 2021. Initially, the patient was treated with Alecensa  (alectinib) for 29 months. However, due to evidence of disease progression, the treatment was switched to lorlatinib , which has been ongoing for approximately 16 months.  In the past three months, the patient reports no breathing problems or chest pain, indicating a stable condition. However, she experienced a bout of food poisoning over the [place], which was characterized by nausea, diarrhea, stomach cramps, and a small fever. The symptoms were managed with Tylenol  and have since resolved.  The patient also reports a weight loss of six pounds since the last visit, which could be attributed to the recent food poisoning episode. She has been taking Crestor  for cholesterol management, which was switched from Lipitor, and has noticed  better results with the new medication.  In terms of social life, the patient recently experienced the loss of her father, which has been a significant emotional event. She is currently adjusting to this change.        MEDICAL HISTORY: Past Medical History:  Diagnosis Date   nscl ca dx'd 06/2019   Pneumonia 05/08/2019    ALLERGIES:  is allergic to iron.  MEDICATIONS:  Current Outpatient Medications  Medication Sig Dispense Refill   acetaminophen  (TYLENOL ) 500 MG tablet Take 1,000 mg by mouth every 6 (six) hours as needed for moderate pain or headache.     albuterol  (VENTOLIN  HFA) 108 (90 Base) MCG/ACT inhaler Inhale 2 puffs into the lungs every 6 (six) hours as needed for wheezing or shortness of breath. 8 g 1   desvenlafaxine  (PRISTIQ ) 50 MG 24 hr tablet Take 1 tablet (50 mg total) by mouth daily. 90 tablet 1   ergocalciferol  (VITAMIN D2) 1.25 MG (50000 UT) capsule Take 1 capsule (50,000 Units total) by mouth 2 (two) times a week. 26 capsule 3   hydrOXYzine  (ATARAX ) 25 MG tablet Take 1 tablet (25 mg total) by mouth 3 (three) times daily as needed for anxiety. **NEEDS APT FOR FURTHER REFILL** 30 tablet 0   levothyroxine  (SYNTHROID ) 100 MCG tablet Take 1 tablet (100 mcg total) by mouth daily. 90 tablet 3   lorlatinib  (LORBRENA ) 100 MG tablet Take 1 tablet (100 mg total) by mouth daily. Swallow tablets whole. Do not chew, crush or split tablets. 30 tablet 2   rosuvastatin  (CRESTOR ) 20 MG tablet Take 1 tablet (20 mg total) by mouth daily. 30 tablet 2   No current facility-administered  medications for this visit.    SURGICAL HISTORY:  Past Surgical History:  Procedure Laterality Date   BIOPSY  07/03/2019   Procedure: BIOPSY;  Surgeon: Jude Harden GAILS, MD;  Location: [facility] ENDOSCOPY;  Service: Cardiopulmonary;;   BRONCHIAL BRUSHINGS  07/03/2019   Procedure: BRONCHIAL BRUSHINGS;  Surgeon: Jude Harden GAILS, MD;  Location: [facility] ENDOSCOPY;  Service: Cardiopulmonary;;   BRONCHIAL WASHINGS   07/03/2019   Procedure: BRONCHIAL WASHINGS;  Surgeon: Jude Harden GAILS, MD;  Location: [facility] ENDOSCOPY;  Service: Cardiopulmonary;;   TOOTH EXTRACTION     VIDEO BRONCHOSCOPY N/A 07/03/2019   Procedure: VIDEO BRONCHOSCOPY WITH FLUORO;  Surgeon: Jude Harden GAILS, MD;  Location: [facility] ENDOSCOPY;  Service: Cardiopulmonary;  Laterality: N/A;  LMA    REVIEW OF SYSTEMS:  Constitutional: positive for fatigue Eyes: negative Ears, nose, mouth, throat, and face: negative Respiratory: negative Cardiovascular: negative Gastrointestinal: negative Genitourinary:negative Integument/breast: negative Hematologic/lymphatic: negative Musculoskeletal:negative Neurological: negative Behavioral/Psych: negative Endocrine: negative Allergic/Immunologic: negative   PHYSICAL EXAMINATION: General appearance: alert, cooperative, fatigued, and no distress Head: Normocephalic, without obvious abnormality, atraumatic Neck: no adenopathy, no JVD, supple, symmetrical, trachea midline, and thyroid  not enlarged, symmetric, no tenderness/mass/nodules Lymph nodes: Cervical, supraclavicular, and axillary nodes normal. Resp: clear to auscultation bilaterally Back: symmetric, no curvature. ROM normal. No CVA tenderness. Cardio: regular rate and rhythm, S1, S2 normal, no murmur, click, rub or gallop GI: soft, non-tender; bowel sounds normal; no masses,  no organomegaly Extremities: extremities normal, atraumatic, no cyanosis or edema Neurologic: Alert and oriented X 3, normal strength and tone. Normal symmetric reflexes. Normal coordination and gait  ECOG PERFORMANCE STATUS: 1 - Symptomatic but completely ambulatory  Blood pressure (!) 143/76, pulse 91, temperature 98.3 F (36.8 C), temperature source Temporal, resp. rate 18, height 5' 10 (1.778 m), weight (!) 307 lb 8 oz (139.5 kg), SpO2 99%.  LABORATORY DATA: Lab Results  Component Value Date   WBC 10.6 (H) 04/08/2023   HGB 13.0 04/08/2023   HCT 39.7 04/08/2023   MCV  87.3 04/08/2023   PLT 297 04/08/2023      Chemistry      Component Value Date/Time   NA 145 04/08/2023 1542   NA 143 08/28/2021 0811   K 4.0 04/08/2023 1542   CL 105 04/08/2023 1542   CO2 29 04/08/2023 1542   BUN 12 04/08/2023 1542   BUN 8 08/28/2021 0811   CREATININE 0.66 04/08/2023 1542      Component Value Date/Time   CALCIUM  9.8 04/08/2023 1542   ALKPHOS 63 04/08/2023 1542   AST 22 04/08/2023 1542   ALT 28 04/08/2023 1542   BILITOT 0.3 04/08/2023 1542       RADIOGRAPHIC STUDIES: CT CHEST ABDOMEN PELVIS W CONTRAST Result Date: 04/08/2023 CLINICAL DATA:  Metastatic lung cancer restaging * Tracking Code: BO * EXAM: CT CHEST, ABDOMEN, AND PELVIS WITH CONTRAST TECHNIQUE: Multidetector CT imaging of the chest, abdomen and pelvis was performed following the standard protocol during bolus administration of intravenous contrast. RADIATION DOSE REDUCTION: This exam was performed according to the departmental dose-optimization program which includes automated exposure control, adjustment of the mA and/or kV according to patient size and/or use of iterative reconstruction technique. CONTRAST:  OMNIPAQUE  IOHEXOL  300 MG/ML  SOLN COMPARISON:  12/24/2022 FINDINGS: CT CHEST FINDINGS Cardiovascular: No significant vascular findings. Normal heart size. No pericardial effusion. Mediastinum/Nodes: No enlarged mediastinal, hilar, or axillary lymph nodes. Thyroid  gland, trachea, and esophagus demonstrate no significant findings. Lungs/Pleura: Unchanged post treatment appearance of the chest, with numerous irregular residua  of treated nodules as well as extensive pleural thickening and interlobular septal thickening, particularly throughout the lung bases. Index nodule in the dependent right lower lobe measures 0.6 cm, unchanged (series 4, image 119). Additional index nodule in the posterior left upper lobe measuring 0.3 cm, unchanged (series 4, image 63). No pleural effusion or pneumothorax.  Musculoskeletal: No chest wall abnormality. No acute osseous findings. CT ABDOMEN PELVIS FINDINGS Hepatobiliary: No solid liver abnormality is seen. No gallstones, gallbladder wall thickening, or biliary dilatation. Pancreas: Unremarkable. No pancreatic ductal dilatation or surrounding inflammatory changes. Spleen: Normal in size without significant abnormality. Adrenals/Urinary Tract: Adrenal glands are unremarkable. Kidneys are normal, without renal calculi, solid lesion, or hydronephrosis. Bladder is unremarkable. Stomach/Bowel: Stomach is within normal limits. Appendix appears normal. No evidence of bowel wall thickening, distention, or inflammatory changes. Sigmoid diverticulosis. Vascular/Lymphatic: No significant vascular findings are present. No enlarged abdominal or pelvic lymph nodes. Reproductive: No mass or other abnormality. Small functional ovarian follicles. Other: No abdominal wall hernia or abnormality. No ascites. Musculoskeletal: No acute osseous findings. Innumerable small sclerotic osseous metastases throughout the axial skeleton, unchanged. IMPRESSION: 1. Unchanged post treatment appearance of the chest, with numerous irregular residua of treated nodules as well as extensive pleural thickening and interlobular septal thickening, particularly throughout the lung bases. 2. Innumerable small sclerotic osseous metastases throughout the axial skeleton, unchanged. 3. No evidence of lymphadenopathy or soft tissue metastatic disease in the abdomen or pelvis. 4. Sigmoid diverticulosis. Electronically Signed   By: Marolyn JONETTA Jaksch M.D.   On: 04/08/2023 18:04     ASSESSMENT AND PLAN: This is a very pleasant 38 years old white female recently diagnosed with stage IV non-small cell lung cancer, adenocarcinoma with positive ALK gene translocation in March 2021.  The patient started treatment with Alecensa  on July 23, 2019 and within few days she started not significant improvement in her condition with  less shortness of breath as well as decrease in supraclavicular lymphadenopathy.  She is currently on treatment with Alecensa  600 mg p.o. twice daily status post 29 months of treatment. The patient was lost to follow-up for the last 6 months but she continues to do fine and she is taking her medication with Alecensa  (Alectinib) as prescribed.  Her treatment was discontinued secondary to disease progression. The patient has started treatment with Lorlatinib  100 mg p.o. daily status post 16 months of treatment.  She has been tolerating this treatment well except for fatigue. She had repeat CT scan of the chest, abdomen and pelvis performed recently.  I personally and independently reviewed the scan and discussed the result with the patient today. Her scan showed no concerning findings for disease progression.    Stage IV Non-Small Cell Lung Cancer (NSCLC) Adenocarcinoma with ALK Gene Translocation Diagnosed in March 2021. Treated with alectinib for 29 months, then lorlatinib  for 16 months due to progression. No current respiratory symptoms or chest pain. Recent scan shows no disease progression. Blood pressure well-managed. Discussed continued lorlatinib  therapy benefits and regular monitoring through labs and scans. Encouraged participation in a lung cancer support group. - Continue lorlatinib  - Order lab work in two months - Order scan in four months - Discuss lung cancer support group  Hypercholesterolemia Switched from Lipitor to Crestor  with improved cholesterol levels. Takes medication at night to minimize side effects. - Continue Crestor  as prescribed  Food Poisoning Recent episode over [place] with nausea, diarrhea, stomach cramps, and mild fever. Managed with Tylenol . No ongoing symptoms.  General Health Maintenance Recent bereavement  of father noted. Encouraged to join a lung cancer support group for additional support. - Encourage participation in lung cancer support  group  Follow-up - Schedule follow-up visit in two months.   The patient was advised to call immediately if she has any concerning symptoms in the interval. The patient voices understanding of current disease status and treatment options and is in agreement with the current care plan.  All questions were answered. The patient knows to call the clinic with any problems, questions or concerns. We can certainly see the patient much sooner if necessary. The total time spent in the appointment was 30 minutes.  Disclaimer: This note was dictated with voice recognition software. Similar sounding words can inadvertently be transcribed and may not be corrected upon review.

## 2023-04-18 ENCOUNTER — Other Ambulatory Visit: Payer: Self-pay

## 2023-05-07 ENCOUNTER — Other Ambulatory Visit: Payer: Self-pay | Admitting: Internal Medicine

## 2023-05-14 ENCOUNTER — Other Ambulatory Visit: Payer: Self-pay

## 2023-05-14 NOTE — Progress Notes (Signed)
Specialty Pharmacy Refill Coordination Note  Sarah Chandler is a 38 y.o. female contacted today regarding refills of specialty medication(s) Lorlatinib [name])   Patient requested [name] at [facility] at [place] date: 05/21/23   Medication will be filled on 05/20/23.

## 2023-05-15 ENCOUNTER — Other Ambulatory Visit (HOSPITAL_COMMUNITY): Payer: Self-pay

## 2023-05-16 ENCOUNTER — Encounter: Payer: Self-pay | Admitting: Family Medicine

## 2023-05-20 ENCOUNTER — Other Ambulatory Visit: Payer: Self-pay

## 2023-06-11 ENCOUNTER — Other Ambulatory Visit (HOSPITAL_COMMUNITY): Payer: Self-pay

## 2023-06-11 ENCOUNTER — Other Ambulatory Visit: Payer: Self-pay

## 2023-06-11 NOTE — Progress Notes (Unsigned)
 [facility] OFFICE PROGRESS NOTE  Sarah Quitter, PA [address] [name] [address]  DIAGNOSIS: Stage IV (T3, N2, M1c) non-small cell lung cancer, adenocarcinoma with ALK gene translocation presented with multiple bilateral pulmonary nodules and masses as well as small mediastinal and bilateral axillary lymphadenopathy and metastatic disease to the brain diagnosed in March 2021.   PRIOR THERAPY: Alecensa (Alectinib) 600 mg p.o. twice daily. First dose started on July 23 2019. Status post 29 months of treatment.   CURRENT THERAPY:  Lorlatinib 100 mg p.o. daily.  First dose was December 06, 2021 status post 18 months of treatment.   INTERVAL HISTORY: Sarah Chandler 38 y.o. female returns to clinic today for follow-up visit.  The patient is followed for her history of stage IV non-small cell lung cancer, adenocarcinoma.  She is currently on targeted treatment with Lorlatinib.  She has been on this for approximately 18 months. She states today treatment is "going good".   She was last seen in the clinic by Dr. Arbutus Ped on 04/15/2023.  At that point in time she had lost weight secondary to possible food poisoning.  She is on Crestor for cholesterol management.   Today she denies any fever, chills, or night sweats.  She gained weight since last being seen. She denies swelling.  Her breathing is "pretty good". She may get stable dyspnea on exertion which is stable. He denies any chest pain, cough, or hemoptysis.  Denies any nausea, vomiting, diarrhea, or constipation.  Denies any rashes or skin changes. She denies headache or vistion changes.  She is here today for evaluation and repeat blood work.   MEDICAL HISTORY: Past Medical History:  Diagnosis Date   nscl ca dx'd 06/2019   Pneumonia 05/08/2019    ALLERGIES:  is allergic to iron.  MEDICATIONS:  Current Outpatient Medications  Medication Sig Dispense Refill   acetaminophen (TYLENOL) 500 MG tablet Take 1,000 mg  by mouth every 6 (six) hours as needed for moderate pain or headache.     albuterol (VENTOLIN HFA) 108 (90 Base) MCG/ACT inhaler Inhale 2 puffs into the lungs every 6 (six) hours as needed for wheezing or shortness of breath. 8 g 1   desvenlafaxine (PRISTIQ) 50 MG 24 hr tablet Take 1 tablet (50 mg total) by mouth daily. 90 tablet 1   ergocalciferol (VITAMIN D2) 1.25 MG (50000 UT) capsule Take 1 capsule (50,000 Units total) by mouth 2 (two) times a week. 26 capsule 3   hydrOXYzine (ATARAX) 25 MG tablet Take 1 tablet (25 mg total) by mouth 3 (three) times daily as needed for anxiety. **NEEDS APT FOR FURTHER REFILL** 30 tablet 0   levothyroxine (SYNTHROID) 100 MCG tablet Take 1 tablet (100 mcg total) by mouth daily. 90 tablet 3   lorlatinib (LORBRENA) 100 MG tablet Take 1 tablet (100 mg total) by mouth daily. Swallow tablets whole. Do not chew, crush or split tablets. 30 tablet 2   rosuvastatin (CRESTOR) 20 MG tablet TAKE 1 TABLET BY MOUTH EVERY DAY 90 tablet 2   No current facility-administered medications for this visit.    SURGICAL HISTORY:  Past Surgical History:  Procedure Laterality Date   BIOPSY  07/03/2019   Procedure: BIOPSY;  Surgeon: Oretha Milch, MD;  Location: [facility] ENDOSCOPY;  Service: Cardiopulmonary;;   BRONCHIAL BRUSHINGS  07/03/2019   Procedure: BRONCHIAL BRUSHINGS;  Surgeon: Oretha Milch, MD;  Location: [facility] ENDOSCOPY;  Service: Cardiopulmonary;;   BRONCHIAL WASHINGS  07/03/2019  Procedure: BRONCHIAL WASHINGS;  Surgeon: Oretha Milch, MD;  Location: [facility] ENDOSCOPY;  Service: Cardiopulmonary;;   TOOTH EXTRACTION     VIDEO BRONCHOSCOPY N/A 07/03/2019   Procedure: VIDEO BRONCHOSCOPY WITH FLUORO;  Surgeon: Oretha Milch, MD;  Location: [facility] ENDOSCOPY;  Service: Cardiopulmonary;  Laterality: N/A;  LMA    REVIEW OF SYSTEMS:   Review of Systems  Constitutional: Positive for weight gain. Negative for appetite change, chills, fatigue, and fever.  HENT: Negative for mouth sores,  nosebleeds, sore throat and trouble swallowing.   Eyes: Negative for eye problems and icterus.  Respiratory: Positive for mild, intermittent, stable dyspnea on exertion. Negative for cough, hemoptysis,  and wheezing.   Cardiovascular: Negative for chest pain and leg swelling.  Gastrointestinal: Negative for abdominal pain, constipation, diarrhea, nausea and vomiting.  Genitourinary: Negative for bladder incontinence, difficulty urinating, dysuria, frequency and hematuria.   Musculoskeletal: Negative for back pain, gait problem, neck pain and neck stiffness.  Skin: Negative for itching and rash.  Neurological: Negative for dizziness, extremity weakness, gait problem, headaches, light-headedness and seizures.  Hematological: Negative for adenopathy. Does not bruise/bleed easily.  Psychiatric/Behavioral: Negative for confusion, depression and sleep disturbance. The patient is not nervous/anxious.     PHYSICAL EXAMINATION:  Blood pressure (!) 156/86, pulse 81, temperature 98.2 F (36.8 C), temperature source Temporal, resp. rate 15, weight (!) 316 lb 8 oz (143.6 kg), SpO2 98%.  ECOG PERFORMANCE STATUS: 1  Physical Exam  Constitutional: Oriented to person, place, and time and well-developed, well-nourished, and in no distress.  HENT:  Head: Normocephalic and atraumatic.  Mouth/Throat: Oropharynx is clear and moist. No oropharyngeal exudate.  Eyes: Conjunctivae are normal. Right eye exhibits no discharge. Left eye exhibits no discharge. No scleral icterus.  Neck: Normal range of motion. Neck supple.  Cardiovascular: Normal rate, regular rhythm, normal heart sounds and intact distal pulses.   Pulmonary/Chest: Effort normal and breath sounds normal. No respiratory distress. No wheezes. No rales.  Abdominal: Soft. Bowel sounds are normal. Exhibits no distension and no mass. There is no tenderness.  Musculoskeletal: Normal range of motion. Exhibits no edema.  Lymphadenopathy:    No cervical  adenopathy.  Neurological: Alert and oriented to person, place, and time. Exhibits normal muscle tone. Gait normal. Coordination normal.  Skin: Skin is warm and dry. No rash noted. Not diaphoretic. No erythema. No pallor.  Psychiatric: Mood, memory and judgment normal.  Vitals reviewed.  LABORATORY DATA: Lab Results  Component Value Date   WBC 7.6 06/13/2023   HGB 12.5 06/13/2023   HCT 37.7 06/13/2023   MCV 84.2 06/13/2023   PLT 257 06/13/2023      Chemistry      Component Value Date/Time   NA 145 04/08/2023 1542   NA 143 08/28/2021 0811   K 4.0 04/08/2023 1542   CL 105 04/08/2023 1542   CO2 29 04/08/2023 1542   BUN 12 04/08/2023 1542   BUN 8 08/28/2021 0811   CREATININE 0.66 04/08/2023 1542      Component Value Date/Time   CALCIUM 9.8 04/08/2023 1542   ALKPHOS 63 04/08/2023 1542   AST 22 04/08/2023 1542   ALT 28 04/08/2023 1542   BILITOT 0.3 04/08/2023 1542       RADIOGRAPHIC STUDIES:  No results found.   ASSESSMENT/PLAN:  This is a pleasant 38 year old caucasian female diagnosed with IV non-small cell lung cancer, adenocarcinoma with positive ALK gene translocation in March 2021.     The patient started treatment with Serita Butcher  on July 23, 2019 and within few days she started not significant improvement in her condition with less shortness of breath as well as decrease in supraclavicular lymphadenopathy.  She is currently on treatment with Alecensa 600 mg p.o. twice daily status post 29 months of treatment.   The patient was lost to follow-up for the last 6 months but she continued to do fine and she was taking her medication with Alecensa (Alectinib) as prescribed.   In August 2023, she had a CT scan showed progression of the diffuse bilateral pulmonary metastasis in addition to progressive diffuse sclerotic bone metastasis. Therefore, Dr. Arbutus Ped recommended discontinuing her treatment with Alecensa (Alectinib).    Therefore, she was started on Lorlatinib 100  mg p.o. daily. She started this on 12/06/21. She has been on this for about 18 months.  She has been tolerating this well.    Labs were reviewed.  Recommend that she continue on the same treatment at the same dose.  We will see her back for labs and follow-up visit in 2 months.  I will arrange for restaging CT scan of the chest, abdomen, and pelvis 1 week prior to her appointment.  She will continue on crestor for her hyperlipidemia. Her lipid panel is pending from today.   The patient was advised to call immediately if she has any concerning symptoms in the interval. The patient voices understanding of current disease status and treatment options and is in agreement with the current care plan. All questions were answered. The patient knows to call the clinic with any problems, questions or concerns. We can certainly see the patient much sooner if necessary    Orders Placed This Encounter  Procedures   CT CHEST ABDOMEN PELVIS W CONTRAST    Standing Status:   Future    Expected Date:   08/05/2023    Expiration Date:   06/12/2024    If indicated for the ordered procedure, I authorize the administration of contrast media per Radiology protocol:   Yes    Does the patient have a contrast media/X-ray dye allergy?:   No    Preferred imaging location?:   [facility]    If indicated for the ordered procedure, I authorize the administration of oral contrast media per Radiology protocol:   Yes   CBC with Differential (Cancer Center Only)    Standing Status:   Future    Expected Date:   08/05/2023    Expiration Date:   06/12/2024   CMP (Cancer Center only)    Standing Status:   Future    Expected Date:   08/05/2023    Expiration Date:   06/12/2024   Lipid panel    Standing Status:   Future    Expected Date:   08/05/2023    Expiration Date:   06/12/2024    The total time spent in the appointment was 20-29 minutes  Darwin Guastella L Genisis Sonnier, PA-C 06/13/23

## 2023-06-11 NOTE — Progress Notes (Signed)
 Specialty Pharmacy Refill Coordination Note  JOLYNN BAJOREK is a 38 y.o. female contacted today regarding refills of specialty medication(s) [name].  Patient requested (Patient-Rptd) Pickup at [facility] date: (Patient-Rptd) 06/28/23   Medication will be filled on 06/27/23.

## 2023-06-12 ENCOUNTER — Other Ambulatory Visit: Payer: Self-pay | Admitting: *Deleted

## 2023-06-12 ENCOUNTER — Other Ambulatory Visit: Payer: Self-pay | Admitting: Physician Assistant

## 2023-06-12 DIAGNOSIS — E7800 Pure hypercholesterolemia, unspecified: Secondary | ICD-10-CM

## 2023-06-12 DIAGNOSIS — C3491 Malignant neoplasm of unspecified part of right bronchus or lung: Secondary | ICD-10-CM

## 2023-06-13 ENCOUNTER — Encounter (INDEPENDENT_AMBULATORY_CARE_PROVIDER_SITE_OTHER): Payer: Self-pay

## 2023-06-13 ENCOUNTER — Inpatient Hospital Stay: Attending: Internal Medicine

## 2023-06-13 ENCOUNTER — Inpatient Hospital Stay: Admitting: Physician Assistant

## 2023-06-13 VITALS — BP 156/86 | HR 81 | Temp 98.2°F | Resp 15 | Wt 316.5 lb

## 2023-06-13 DIAGNOSIS — C7931 Secondary malignant neoplasm of brain: Secondary | ICD-10-CM | POA: Diagnosis not present

## 2023-06-13 DIAGNOSIS — Z79899 Other long term (current) drug therapy: Secondary | ICD-10-CM | POA: Diagnosis not present

## 2023-06-13 DIAGNOSIS — C7951 Secondary malignant neoplasm of bone: Secondary | ICD-10-CM | POA: Diagnosis not present

## 2023-06-13 DIAGNOSIS — E7800 Pure hypercholesterolemia, unspecified: Secondary | ICD-10-CM | POA: Diagnosis not present

## 2023-06-13 DIAGNOSIS — C7802 Secondary malignant neoplasm of left lung: Secondary | ICD-10-CM | POA: Insufficient documentation

## 2023-06-13 DIAGNOSIS — C3491 Malignant neoplasm of unspecified part of right bronchus or lung: Secondary | ICD-10-CM

## 2023-06-13 DIAGNOSIS — E785 Hyperlipidemia, unspecified: Secondary | ICD-10-CM | POA: Insufficient documentation

## 2023-06-13 DIAGNOSIS — C3431 Malignant neoplasm of lower lobe, right bronchus or lung: Secondary | ICD-10-CM | POA: Diagnosis present

## 2023-06-13 LAB — CBC WITH DIFFERENTIAL (CANCER CENTER ONLY)
Abs Immature Granulocytes: 0.02 10*3/uL (ref 0.00–0.07)
Basophils Absolute: 0.1 10*3/uL (ref 0.0–0.1)
Basophils Relative: 1 %
Eosinophils Absolute: 0.3 10*3/uL (ref 0.0–0.5)
Eosinophils Relative: 4 %
HCT: 37.7 % (ref 36.0–46.0)
Hemoglobin: 12.5 g/dL (ref 12.0–15.0)
Immature Granulocytes: 0 %
Lymphocytes Relative: 38 %
Lymphs Abs: 2.9 10*3/uL (ref 0.7–4.0)
MCH: 27.9 pg (ref 26.0–34.0)
MCHC: 33.2 g/dL (ref 30.0–36.0)
MCV: 84.2 fL (ref 80.0–100.0)
Monocytes Absolute: 0.7 10*3/uL (ref 0.1–1.0)
Monocytes Relative: 9 %
Neutro Abs: 3.7 10*3/uL (ref 1.7–7.7)
Neutrophils Relative %: 48 %
Platelet Count: 257 10*3/uL (ref 150–400)
RBC: 4.48 MIL/uL (ref 3.87–5.11)
RDW: 13.2 % (ref 11.5–15.5)
WBC Count: 7.6 10*3/uL (ref 4.0–10.5)
nRBC: 0 % (ref 0.0–0.2)

## 2023-06-13 LAB — CMP (CANCER CENTER ONLY)
ALT: 23 U/L (ref 0–44)
AST: 18 U/L (ref 15–41)
Albumin: 4.1 g/dL (ref 3.5–5.0)
Alkaline Phosphatase: 52 U/L (ref 38–126)
Anion gap: 6 (ref 5–15)
BUN: 10 mg/dL (ref 6–20)
CO2: 24 mmol/L (ref 22–32)
Calcium: 8.7 mg/dL — ABNORMAL LOW (ref 8.9–10.3)
Chloride: 110 mmol/L (ref 98–111)
Creatinine: 0.61 mg/dL (ref 0.44–1.00)
GFR, Estimated: >60 mL/min (ref >60)
Glucose, Bld: 95 mg/dL (ref 70–99)
Potassium: 3.8 mmol/L (ref 3.5–5.1)
Sodium: 140 mmol/L (ref 135–145)
Total Bilirubin: 0.3 mg/dL (ref 0.0–1.2)
Total Protein: 7.2 g/dL (ref 6.5–8.1)

## 2023-06-13 LAB — LIPID PANEL
Cholesterol: 189 mg/dL (ref 0–200)
HDL: 63 mg/dL (ref >40)
LDL Cholesterol: 101 mg/dL — ABNORMAL HIGH (ref 0–99)
Total CHOL/HDL Ratio: 3 ratio
Triglycerides: 123 mg/dL (ref <150)
VLDL: 25 mg/dL (ref 0–40)

## 2023-06-14 ENCOUNTER — Other Ambulatory Visit: Payer: Self-pay

## 2023-06-27 ENCOUNTER — Other Ambulatory Visit: Payer: Self-pay

## 2023-06-28 ENCOUNTER — Ambulatory Visit: Payer: Self-pay | Admitting: *Deleted

## 2023-06-28 ENCOUNTER — Telehealth (INDEPENDENT_AMBULATORY_CARE_PROVIDER_SITE_OTHER): Admitting: Nurse Practitioner

## 2023-06-28 ENCOUNTER — Telehealth: Payer: Self-pay | Admitting: Medical Oncology

## 2023-06-28 VITALS — Temp 97.6°F

## 2023-06-28 DIAGNOSIS — K047 Periapical abscess without sinus: Secondary | ICD-10-CM | POA: Diagnosis not present

## 2023-06-28 MED ORDER — AMOXICILLIN-POT CLAVULANATE 875-125 MG PO TABS: 1.0000 | ORAL_TABLET | Freq: Two times a day (BID) | ORAL | 0 refills | Status: DC

## 2023-06-28 NOTE — Telephone Encounter (Signed)
  Chief Complaint: facial swelling tooth pain, requesting antibiotics prior to dentis appt earliest in April Symptoms: left back molar tooth pain better now since "abscess" burst. Left side of face swollen.  Frequency: 3 days  Pertinent Negatives: Patient denies fever no severe swelling of face. No severe pain . Disposition: [] ED /[] Urgent Care (no appt availability in office) / [x] Appointment(In office/virtual)/ []  [facility]/ [] Home Care/ [] Refused Recommended Disposition /[] [place] [facility]/ []  Follow-up with PCP Additional Notes:   Patient PCP not at practice and other provider has not available appt. Scheduled patient with another provider [facility] per request for today . Recommended if fever noted or face swelling worsening go to UC/ED.      Copied from CRM [id number]. Topic: Appointments - Appointment Scheduling >> Jun 28, 2023  9:54 AM Emylou G wrote: Abcess tooth, face is swollen..pls call [phone] Reason for Disposition  [1] Face is swollen AND [2] no fever  Answer Assessment - Initial Assessment Questions 1. LOCATION: "Which tooth is hurting?"  (e.g., right-side/left-side, upper/lower, front/back)     Left back molar 2. ONSET: "When did the toothache start?"  (e.g., hours, days)      3 days ago 3. SEVERITY: "How bad is the toothache?"  (Scale 1-10; mild, moderate or severe)   - MILD (1-3): doesn't interfere with chewing    - MODERATE (4-7): interferes with chewing, interferes with normal activities, awakens from sleep     - SEVERE (8-10): unable to eat, unable to do any normal activities, excruciating pain        Feels better now since "abscess" burst 4. SWELLING: "Is there any visible swelling of your face?"     Left side of face swelling  5. OTHER SYMPTOMS: "Do you have any other symptoms?" (e.g., fever)     no 6. PREGNANCY: "Is there any chance you are pregnant?" "When was your last menstrual period?"     na  Protocols used: [facility]

## 2023-06-28 NOTE — Telephone Encounter (Signed)
"   abscessed tooth burst last night" Pt states it is her left upper molar that burst and it is  draining blood and pus-denies foul taste / odor or pain reported.   She reported she has facial swelling under left cheek.  She is using salt water rinses , otc mouthwash.  She asked for an antibiotic.   She does not have a dentist .   I instructed her to contact her PCP office for further recommendations or go to Urgent dental /medical care.   Dr Arbutus Ped is treating her lung cancer with TKI  Lobrena.

## 2023-06-28 NOTE — Progress Notes (Signed)
   Established Patient Office Visit  An audio/visual tele-health visit was completed today for this patient. I connected with  Sarah Chandler on 06/28/23 utilizing audio/visual technology and verified that I am speaking with the correct person using two identifiers. The patient was located at their home, and I was located at the office of [facility] at [facility] during the encounter. I discussed the limitations of evaluation and management by telemedicine. The patient expressed understanding and agreed to proceed.     Subjective   Patient ID: Sarah Chandler, female    DOB: [birth year]  Age: 38 y.o. MRN: [id number]  Chief Complaint  Patient presents with   Abscess    On the left side, which busted last night, no pain at the moment or bleeding     Patient virtual visit for the above. Left upper mouth molar developed an abscess over the last few days.  She reports last night it did burst and started draining.  She reports blood and pus came out of the area.  She has been treating with peroxide mouthwash and salt water.  She reports pain is much improved since the abscess drained.  She reports swelling is better but there is still some swelling still there.  She tells me she is afebrile.    ROS: see HPI    Objective:     Temp 97.6 F (36.4 C) Comment: temporal thermometer   Physical Exam Comprehensive physical exam not completed today as office visit was conducted remotely.  Unable to visualize tooth on video, left cheek does look a bit swollen compared to right side.  Otherwise patient appears well..  Patient was alert and oriented, and appeared to have appropriate judgment.   No results found for any visits on 06/28/23.    The ASCVD Risk score (Arnett DK, et al., 2019) failed to calculate for the following reasons:   The 2019 ASCVD risk score is only valid for ages 38 to 85    Assessment & Plan:   Problem List Items Addressed This Visit       Digestive    Tooth abscess - Primary   Acute Improving since abscess burst and started draining. Patient likely immunocompromise based on her medical history of lung cancer. Will treat with course of Augmentin. Patient reports she has upcoming dental appointment in about 2 weeks, she was encouraged to keep this appointment and follow-up with dentist as scheduled. We also discussed risks and benefits of antibiotic therapy, and she was given instructions on when to call the office if she experiences side effects. She reports understanding.      Relevant Medications   amoxicillin-clavulanate (AUGMENTIN) 875-125 MG tablet    Return if symptoms worsen or fail to improve.    Elenore Paddy, NP

## 2023-06-28 NOTE — Assessment & Plan Note (Signed)
 Acute Improving since abscess burst and started draining. Patient likely immunocompromise based on her medical history of lung cancer. Will treat with course of Augmentin. Patient reports she has upcoming dental appointment in about 2 weeks, she was encouraged to keep this appointment and follow-up with dentist as scheduled. We also discussed risks and benefits of antibiotic therapy, and she was given instructions on when to call the office if she experiences side effects. She reports understanding.

## 2023-07-12 NOTE — Progress Notes (Signed)
 Faxed confirmed for medical consent for dental treatment to [facility] at [phone].

## 2023-07-17 ENCOUNTER — Other Ambulatory Visit: Payer: Self-pay

## 2023-07-17 ENCOUNTER — Other Ambulatory Visit: Payer: Self-pay | Admitting: Internal Medicine

## 2023-07-17 DIAGNOSIS — C3491 Malignant neoplasm of unspecified part of right bronchus or lung: Secondary | ICD-10-CM

## 2023-07-17 MED ORDER — LORLATINIB 100 MG PO TABS
100.0000 mg | ORAL_TABLET | Freq: Every day | ORAL | 2 refills | Status: DC
  Filled 2023-07-17: qty 30, 30d supply, fill #0
  Filled 2023-08-23: qty 30, 30d supply, fill #1
  Filled 2023-09-24: qty 30, 30d supply, fill #2

## 2023-07-17 NOTE — Progress Notes (Signed)
 Specialty Pharmacy Refill Coordination Note  Sarah Chandler is a 38 y.o. female contacted today regarding refills of specialty medication(s) Lorlatinib [name])   Patient requested (Patient-Rptd) Pickup at [facility] at [facility] date: (Patient-Rptd) 08/05/23   Medication will be filled on 04.25.25.

## 2023-07-18 ENCOUNTER — Other Ambulatory Visit (HOSPITAL_COMMUNITY): Payer: Self-pay

## 2023-07-18 ENCOUNTER — Other Ambulatory Visit: Payer: Self-pay

## 2023-07-27 ENCOUNTER — Other Ambulatory Visit: Payer: Self-pay | Admitting: Family Medicine

## 2023-07-27 DIAGNOSIS — F411 Generalized anxiety disorder: Secondary | ICD-10-CM

## 2023-07-27 DIAGNOSIS — F321 Major depressive disorder, single episode, moderate: Secondary | ICD-10-CM

## 2023-07-31 ENCOUNTER — Other Ambulatory Visit (HOSPITAL_COMMUNITY): Payer: Self-pay

## 2023-07-31 ENCOUNTER — Telehealth: Payer: Self-pay | Admitting: Internal Medicine

## 2023-07-31 ENCOUNTER — Encounter: Payer: Self-pay | Admitting: Internal Medicine

## 2023-07-31 ENCOUNTER — Ambulatory Visit (INDEPENDENT_AMBULATORY_CARE_PROVIDER_SITE_OTHER): Payer: Medicaid Other | Admitting: Internal Medicine

## 2023-07-31 VITALS — BP 104/68 | HR 90 | Ht 70.0 in | Wt 322.0 lb

## 2023-07-31 DIAGNOSIS — E039 Hypothyroidism, unspecified: Secondary | ICD-10-CM

## 2023-07-31 DIAGNOSIS — E559 Vitamin D deficiency, unspecified: Secondary | ICD-10-CM | POA: Diagnosis not present

## 2023-07-31 LAB — T4, FREE: Free T4: 0.9 ng/dL (ref 0.8–1.8)

## 2023-07-31 LAB — TSH: TSH: 6.76 m[IU]/L — ABNORMAL HIGH

## 2023-07-31 LAB — VITAMIN D 25 HYDROXY (VIT D DEFICIENCY, FRACTURES): Vit D, 25-Hydroxy: 143 ng/mL — ABNORMAL HIGH (ref 30–100)

## 2023-07-31 MED ORDER — LEVOTHYROXINE SODIUM 125 MCG PO TABS
125.0000 ug | ORAL_TABLET | Freq: Every day | ORAL | 3 refills | Status: AC
  Filled 2023-07-31: qty 90, 90d supply, fill #0

## 2023-07-31 NOTE — Telephone Encounter (Signed)
 Please let the patient know that her vitamin D  is extremely high, she needs to discontinue vitamin D     Her thyroid  is not controlled as well, she needs more thyroid  medication   Please asked the patient to stop levothyroxine  100 mcg and start the new prescription of 125 mcg daily     Please schedule the patient for a repeat lab test in 2 months    Thanks

## 2023-07-31 NOTE — Telephone Encounter (Signed)
 Pt has been notified and she has been scheduled for labs.

## 2023-07-31 NOTE — Patient Instructions (Signed)

## 2023-07-31 NOTE — Progress Notes (Unsigned)
 Name: Sarah Chandler  MRN/ DOB: [id number], June 25, 1985    Age/ Sex: 38 y.o., female     PCP: Noreene Bearded, PA   Reason for Endocrinology Evaluation: Graves' Disease     Initial Endocrinology Clinic Visit: 06/08/2019    PATIENT IDENTIFIER: 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 She has followed with [facility] since 06/08/2019 for consultative assistance with management of her hyperthyroidism.   HISTORICAL SUMMARY:  Pt presented to the ED on 05/10/2019 with palpitations and fatigue. She was noted to have a suppressed TSH < 0.005 uIU/mL with elevated FT4 at 1.70 ng/dL .She was started on Methimazole  at the time.   In the meantime she was diagnosed with metastatic lung ca and was on Alectinib. TSH trended up to 91 uIU/mL by 09/2019 and methimazole  was stopped.   LT-4 replacement was started 12/2019  No FH of thyroid  disease SUBJECTIVE:   Today (07/31/2023):  Ms. Base is here for a follow up on hypothyroidism    Patient continues to follow-up with oncology for metastatic lung cancer.  She is currently on Lorlatinib .  CT imaging showed stable mets  Patient continues to weight gain Has noted some improvement in energy and walking the tracks and lifting weight  No SOB Denies local neck swelling  Denis constipation or diarrhea  Denies palpitations  Denies tremors    Levothyroxine  100 mcg daily  Ergocalciferol  50, 000 iu twice weekly  ( MOndays )     HISTORY:  Past Medical History:  Past Medical History:  Diagnosis Date   nscl ca dx'd 06/2019   Pneumonia 05/08/2019   Past Surgical History:  Past Surgical History:  Procedure Laterality Date   BIOPSY  07/03/2019   Procedure: BIOPSY;  Surgeon: Lind Repine, MD;  Location: [facility] ENDOSCOPY;  Service: Cardiopulmonary;;   BRONCHIAL BRUSHINGS  07/03/2019   Procedure: BRONCHIAL BRUSHINGS;  Surgeon: Lind Repine, MD;  Location: [facility] ENDOSCOPY;  Service: Cardiopulmonary;;   BRONCHIAL WASHINGS  07/03/2019   Procedure: BRONCHIAL WASHINGS;  Surgeon: Lind Repine, MD;  Location: [facility] ENDOSCOPY;  Service: Cardiopulmonary;;   TOOTH EXTRACTION     VIDEO BRONCHOSCOPY N/A 07/03/2019   Procedure: VIDEO BRONCHOSCOPY WITH FLUORO;  Surgeon: Lind Repine, MD;  Location: [facility] ENDOSCOPY;  Service: Cardiopulmonary;  Laterality: N/A;  LMA   Social History:  reports that she has never smoked. She has never been exposed to tobacco smoke. She has never used smokeless tobacco. She reports current alcohol use. She reports that she does not use drugs. Family History:  Family History  Problem Relation Age of Onset   Diabetes Father    Hyperlipidemia Father    Hypertension Father    Depression Maternal Uncle    Diabetes Maternal Uncle    Cancer Maternal Grandmother    Hyperlipidemia Maternal Grandmother    Cancer Paternal Grandfather      HOME MEDICATIONS: Allergies as of 07/31/2023       Reactions   Iron Other (See Comments)   Ferrosol - Liquid  Unknown reaction         Medication List        Accurate as of July 31, 2023  6:46 AM. If you have any questions, ask your nurse or doctor.          acetaminophen  500 MG tablet Commonly known as: TYLENOL  Take 1,000 mg by mouth every 6 (six) hours  as needed for moderate pain or headache.   albuterol  108 (90 Base) MCG/ACT inhaler Commonly known as: VENTOLIN  HFA Inhale 2 puffs into the lungs every 6 (six) hours as needed for wheezing or shortness of breath.   amoxicillin -clavulanate 875-125 MG tablet Commonly known as: AUGMENTIN  Take 1 tablet by mouth 2 (two) times daily.   desvenlafaxine  50 MG 24 hr tablet Commonly known as: PRISTIQ  TAKE 1 TABLET BY MOUTH EVERY DAY   ergocalciferol  1.25 MG (50000 UT) capsule Commonly known as: VITAMIN D2 Take 1 capsule (50,000 Units total) by mouth 2 (two) times a week.   hydrOXYzine  25 MG tablet Commonly known  as: ATARAX  Take 1 tablet (25 mg total) by mouth 3 (three) times daily as needed for anxiety. **NEEDS APT FOR FURTHER REFILL**   levothyroxine  100 MCG tablet Commonly known as: SYNTHROID  Take 1 tablet (100 mcg total) by mouth daily.   lorlatinib  100 MG tablet Commonly known as: LORBRENA  Take 1 tablet (100 mg total) by mouth daily. Swallow tablets whole. Do not chew, crush or split tablets.   rosuvastatin  20 MG tablet Commonly known as: CRESTOR  TAKE 1 TABLET BY MOUTH EVERY DAY          OBJECTIVE:   PHYSICAL EXAM: VS: There were no vitals taken for this visit.   EXAM: General: Pt appears well and is in NAD  Neck: General: Supple without adenopathy. Thyroid : Thyroid  size normal.  No goiter or nodules appreciated.   Lungs: Clear with good BS bilat   Heart: Auscultation: RRR.  Extremities:  BL LE: No pretibial edema normal ROM and strength.     DATA REVIEWED:    Latest Reference Range & Units 02/05/23 14:14  VITD 30.00 - 100.00 ng/mL 57.83    Latest Reference Range & Units 02/05/23 14:14  TSH 0.35 - 5.50 uIU/mL 2.14    Results for DAILAH, OPPERMAN (MRN [id number]) as of 12/25/2019 09:03  Ref. Range 06/08/2019 14:52  TRAB Latest Ref Range: <=2.00 IU/L 5.19 (H)   ASSESSMENT / PLAN / RECOMMENDATIONS:   Hypothyroidism :    - She was initially diagnosed with hyperthyroidism secondary to Graves' disease in 04/2019, she had to be started on Alectinib in 07/2019 for metastatic lung cancer and by 09/2019 she was off Methimazole  followed by hypothyroidism .  - No local neck symptoms  - TSH remains within normal range   Medication Continue levothyroxine  100 mcg daily    2. Hx of Graves' Disease:  -No extrathyroidal manifestations of Graves' disease    3. Vitamin D  deficiency:  - Repeat Vitamin D  is normal  Medication   - Continue ergocalciferol  50,000 iu twice weekly   4.  Heat intolerance:  -TSH, FSH normal -Unclear if this is related to anxiety or panic  attacks -Discussed the importance of dressing light and with layers  F/U in 1 year    Signed electronically by: Natale Bail, MD  [facility]  [facility] [address] [place], [place] [address] Phone: [phone] FAX: [phone]      CC: Noreene Bearded, PA [address] [place] [address] Phone: [phone]  Fax: [phone]   Return to Endocrinology clinic as below: Future Appointments  Date Time Provider Department Center  07/31/2023  7:50 AM Camyla Camposano, Julian Obey, MD [facility] None  08/05/2023  7:45 AM [facility] None  08/05/2023  8:30 AM [facility] 2 [facility] [place]  08/06/2023 11:10 AM Yuchen Fedor, Julian Obey, MD [facility] None  08/14/2023  8:45 AM Marlene Simas, MD [facility] None

## 2023-08-01 ENCOUNTER — Other Ambulatory Visit: Payer: Self-pay

## 2023-08-02 ENCOUNTER — Other Ambulatory Visit: Payer: Self-pay

## 2023-08-05 ENCOUNTER — Inpatient Hospital Stay: Attending: Internal Medicine

## 2023-08-05 ENCOUNTER — Ambulatory Visit (HOSPITAL_COMMUNITY)
Admission: RE | Admit: 2023-08-05 | Discharge: 2023-08-05 | Disposition: A | Discharge status: Home / Self Care | Source: Ambulatory Visit | Attending: Physician Assistant

## 2023-08-05 DIAGNOSIS — R591 Generalized enlarged lymph nodes: Secondary | ICD-10-CM | POA: Diagnosis not present

## 2023-08-05 DIAGNOSIS — C3490 Malignant neoplasm of unspecified part of unspecified bronchus or lung: Secondary | ICD-10-CM | POA: Diagnosis not present

## 2023-08-05 DIAGNOSIS — C3491 Malignant neoplasm of unspecified part of right bronchus or lung: Secondary | ICD-10-CM | POA: Insufficient documentation

## 2023-08-05 DIAGNOSIS — C3431 Malignant neoplasm of lower lobe, right bronchus or lung: Secondary | ICD-10-CM | POA: Insufficient documentation

## 2023-08-05 DIAGNOSIS — E7800 Pure hypercholesterolemia, unspecified: Secondary | ICD-10-CM

## 2023-08-05 LAB — CBC WITH DIFFERENTIAL (CANCER CENTER ONLY)
Abs Immature Granulocytes: 0.01 10*3/uL (ref 0.00–0.07)
Basophils Absolute: 0.1 10*3/uL (ref 0.0–0.1)
Basophils Relative: 1 %
Eosinophils Absolute: 0.6 10*3/uL — ABNORMAL HIGH (ref 0.0–0.5)
Eosinophils Relative: 6 %
HCT: 37.9 % (ref 36.0–46.0)
Hemoglobin: 12.5 g/dL (ref 12.0–15.0)
Immature Granulocytes: 0 %
Lymphocytes Relative: 38 %
Lymphs Abs: 3.3 10*3/uL (ref 0.7–4.0)
MCH: 28.5 pg (ref 26.0–34.0)
MCHC: 33 g/dL (ref 30.0–36.0)
MCV: 86.5 fL (ref 80.0–100.0)
Monocytes Absolute: 0.9 10*3/uL (ref 0.1–1.0)
Monocytes Relative: 10 %
Neutro Abs: 3.8 10*3/uL (ref 1.7–7.7)
Neutrophils Relative %: 45 %
Platelet Count: 239 10*3/uL (ref 150–400)
RBC: 4.38 MIL/uL (ref 3.87–5.11)
RDW: 13.2 % (ref 11.5–15.5)
WBC Count: 8.6 10*3/uL (ref 4.0–10.5)
nRBC: 0 % (ref 0.0–0.2)

## 2023-08-05 LAB — CMP (CANCER CENTER ONLY)
ALT: 34 U/L (ref 0–44)
AST: 26 U/L (ref 15–41)
Albumin: 4.1 g/dL (ref 3.5–5.0)
Alkaline Phosphatase: 54 U/L (ref 38–126)
Anion gap: 8 (ref 5–15)
BUN: 12 mg/dL (ref 6–20)
CO2: 26 mmol/L (ref 22–32)
Calcium: 8.9 mg/dL (ref 8.9–10.3)
Chloride: 107 mmol/L (ref 98–111)
Creatinine: 0.69 mg/dL (ref 0.44–1.00)
GFR, Estimated: >60 mL/min (ref >60)
Glucose, Bld: 94 mg/dL (ref 70–99)
Potassium: 3.9 mmol/L (ref 3.5–5.1)
Sodium: 141 mmol/L (ref 135–145)
Total Bilirubin: 0.3 mg/dL (ref 0.0–1.2)
Total Protein: 7.3 g/dL (ref 6.5–8.1)

## 2023-08-05 LAB — LIPID PANEL
Cholesterol: 195 mg/dL (ref 0–200)
HDL: 51 mg/dL (ref >40)
LDL Cholesterol: 124 mg/dL — ABNORMAL HIGH (ref 0–99)
Total CHOL/HDL Ratio: 3.8 ratio
Triglycerides: 100 mg/dL (ref <150)
VLDL: 20 mg/dL (ref 0–40)

## 2023-08-05 MED ORDER — SODIUM CHLORIDE (PF) 0.9 % IJ SOLN
INTRAMUSCULAR | Status: AC
  Filled 2023-08-05: qty 50

## 2023-08-05 MED ORDER — IOHEXOL 300 MG/ML  SOLN
100.0000 mL | Freq: Once | INTRAMUSCULAR | Status: AC | PRN
  Administered 2023-08-05: 100 mL via INTRAVENOUS

## 2023-08-06 ENCOUNTER — Ambulatory Visit: Payer: Medicaid Other | Admitting: Internal Medicine

## 2023-08-13 ENCOUNTER — Other Ambulatory Visit: Payer: Self-pay | Admitting: Medical Oncology

## 2023-08-13 DIAGNOSIS — C3491 Malignant neoplasm of unspecified part of right bronchus or lung: Secondary | ICD-10-CM

## 2023-08-14 ENCOUNTER — Inpatient Hospital Stay: Attending: Internal Medicine | Admitting: Internal Medicine

## 2023-08-14 VITALS — BP 133/72 | HR 99 | Temp 97.7°F | Resp 17 | Ht 70.0 in | Wt 317.5 lb

## 2023-08-14 DIAGNOSIS — C3431 Malignant neoplasm of lower lobe, right bronchus or lung: Secondary | ICD-10-CM | POA: Diagnosis present

## 2023-08-14 DIAGNOSIS — C3491 Malignant neoplasm of unspecified part of right bronchus or lung: Secondary | ICD-10-CM

## 2023-08-14 DIAGNOSIS — Z79899 Other long term (current) drug therapy: Secondary | ICD-10-CM | POA: Diagnosis not present

## 2023-08-14 DIAGNOSIS — C7931 Secondary malignant neoplasm of brain: Secondary | ICD-10-CM | POA: Diagnosis not present

## 2023-08-14 NOTE — Progress Notes (Signed)
 [facility] [phone]   [phone]  OFFICE PROGRESS NOTE  Sarah Bearded, PA [address] [place] [address]  DIAGNOSIS: Stage IV (T3, N2, M1c) non-small cell lung cancer, adenocarcinoma with ALK gene translocation presented with multiple bilateral pulmonary nodules and masses as well as small mediastinal and bilateral axillary lymphadenopathy and metastatic disease to the brain diagnosed in March 2021.  PRIOR THERAPY: Alecensa  (Alectinib) 600 mg p.o. twice daily.  First dose started on July 23 2019.  Status post 29 months of treatment.  CURRENT THERAPY: Lorlatinib  100 mg p.o. daily.  First dose was December 06, 2021 status post 20 months of treatment.  INTERVAL HISTORY: Sarah Chandler 38 y.o. female returns to the clinic today for follow-up visit. Discussed the use of AI scribe software for clinical note transcription with the patient, who gave verbal consent to proceed.  History of Present Illness   Sarah Chandler is a 38 year old female with stage four non-small cell lung cancer who presents for evaluation and restaging of her disease.  Diagnosed with stage four non-small cell lung cancer, adenocarcinoma, in March 2021 with a positive ALK gene translocation. Initially treated with alectinib for 29 months until disease progression.  Since August 2023, she has been on lorlatinib , 100 mg orally once daily, with good tolerance. No new symptoms such as chest pain, breathing issues, nausea, vomiting, or diarrhea. No adverse effects from lorlatinib .  Recent changes in endocrinology management include discontinuation of vitamin D  and adjustment of levothyroxine  dosage to 125 mcg. She remains active with no headaches or changes in vision.  A CT scan was performed less than two weeks ago. No new symptoms or changes in condition since the last visit two months ago.       MEDICAL HISTORY: Past Medical History:  Diagnosis Date   nscl  ca dx'd 06/2019   Pneumonia 05/08/2019    ALLERGIES:  is allergic to iron.  MEDICATIONS:  Current Outpatient Medications  Medication Sig Dispense Refill   acetaminophen  (TYLENOL ) 500 MG tablet Take 1,000 mg by mouth every 6 (six) hours as needed for moderate pain or headache.     albuterol  (VENTOLIN  HFA) 108 (90 Base) MCG/ACT inhaler Inhale 2 puffs into the lungs every 6 (six) hours as needed for wheezing or shortness of breath. 8 g 1   amoxicillin -clavulanate (AUGMENTIN ) 875-125 MG tablet Take 1 tablet by mouth 2 (two) times daily. 20 tablet 0   desvenlafaxine  (PRISTIQ ) 50 MG 24 hr tablet TAKE 1 TABLET BY MOUTH EVERY DAY 90 tablet 0   hydrOXYzine  (ATARAX ) 25 MG tablet Take 1 tablet (25 mg total) by mouth 3 (three) times daily as needed for anxiety. **NEEDS APT FOR FURTHER REFILL** 30 tablet 0   levothyroxine  (SYNTHROID ) 125 MCG tablet Take 1 tablet (125 mcg total) by mouth daily. 90 tablet 3   lorlatinib  (LORBRENA ) 100 MG tablet Take 1 tablet (100 mg total) by mouth daily. Swallow tablets whole. Do not chew, crush or split tablets. 30 tablet 2   rosuvastatin  (CRESTOR ) 20 MG tablet TAKE 1 TABLET BY MOUTH EVERY DAY 90 tablet 2   No current facility-administered medications for this visit.    SURGICAL HISTORY:  Past Surgical History:  Procedure Laterality Date   BIOPSY  07/03/2019   Procedure: BIOPSY;  Surgeon: Lind Repine, MD;  Location: [facility] ENDOSCOPY;  Service: Cardiopulmonary;;   BRONCHIAL BRUSHINGS  07/03/2019   Procedure: BRONCHIAL BRUSHINGS;  Surgeon:  Lind Repine, MD;  Location: [facility] ENDOSCOPY;  Service: Cardiopulmonary;;   BRONCHIAL WASHINGS  07/03/2019   Procedure: BRONCHIAL WASHINGS;  Surgeon: Lind Repine, MD;  Location: [facility] ENDOSCOPY;  Service: Cardiopulmonary;;   TOOTH EXTRACTION     VIDEO BRONCHOSCOPY N/A 07/03/2019   Procedure: VIDEO BRONCHOSCOPY WITH FLUORO;  Surgeon: Lind Repine, MD;  Location: [facility] ENDOSCOPY;  Service: Cardiopulmonary;  Laterality: N/A;  LMA     REVIEW OF SYSTEMS:  Constitutional: negative Eyes: negative Ears, nose, mouth, throat, and face: negative Respiratory: negative Cardiovascular: negative Gastrointestinal: negative Genitourinary:negative Integument/breast: negative Hematologic/lymphatic: negative Musculoskeletal:negative Neurological: negative Behavioral/Psych: negative Endocrine: negative Allergic/Immunologic: negative   PHYSICAL EXAMINATION: General appearance: alert, cooperative, fatigued, and no distress Head: Normocephalic, without obvious abnormality, atraumatic Neck: no adenopathy, no JVD, supple, symmetrical, trachea midline, and thyroid  not enlarged, symmetric, no tenderness/mass/nodules Lymph nodes: Cervical, supraclavicular, and axillary nodes normal. Resp: clear to auscultation bilaterally Back: symmetric, no curvature. ROM normal. No CVA tenderness. Cardio: regular rate and rhythm, S1, S2 normal, no murmur, click, rub or gallop GI: soft, non-tender; bowel sounds normal; no masses,  no organomegaly Extremities: extremities normal, atraumatic, no cyanosis or edema Neurologic: Alert and oriented X 3, normal strength and tone. Normal symmetric reflexes. Normal coordination and gait  ECOG PERFORMANCE STATUS: 1 - Symptomatic but completely ambulatory  Blood pressure 133/72, pulse 99, temperature 97.7 F (36.5 C), temperature source Temporal, resp. rate 17, height 5\' 10"  (1.778 m), weight (!) 317 lb 8 oz (144 kg), SpO2 99%.  LABORATORY DATA: Lab Results  Component Value Date   WBC 8.6 08/05/2023   HGB 12.5 08/05/2023   HCT 37.9 08/05/2023   MCV 86.5 08/05/2023   PLT 239 08/05/2023      Chemistry      Component Value Date/Time   NA 141 08/05/2023 0727   NA 143 08/28/2021 0811   K 3.9 08/05/2023 0727   CL 107 08/05/2023 0727   CO2 26 08/05/2023 0727   BUN 12 08/05/2023 0727   BUN 8 08/28/2021 0811   CREATININE 0.69 08/05/2023 0727      Component Value Date/Time   CALCIUM  8.9 08/05/2023  0727   ALKPHOS 54 08/05/2023 0727   AST 26 08/05/2023 0727   ALT 34 08/05/2023 0727   BILITOT 0.3 08/05/2023 0727       RADIOGRAPHIC STUDIES: CT CHEST ABDOMEN PELVIS W CONTRAST Result Date: 08/12/2023 CLINICAL DATA:  Metastatic lung cancer restaging * Tracking Code: BO * EXAM: CT CHEST, ABDOMEN, AND PELVIS WITH CONTRAST TECHNIQUE: Multidetector CT imaging of the chest, abdomen and pelvis was performed following the standard protocol during bolus administration of intravenous contrast. RADIATION DOSE REDUCTION: This exam was performed according to the departmental dose-optimization program which includes automated exposure control, adjustment of the mA and/or kV according to patient size and/or use of iterative reconstruction technique. CONTRAST:  OMNIPAQUE  IOHEXOL  300 MG/ML  SOLN COMPARISON:  04/08/2023 FINDINGS: CT CHEST FINDINGS Cardiovascular: Unremarkable Mediastinum/Nodes: Unremarkable Lungs/Pleura: Faint peripheral reticulonodular accentuation both lungs along with faint nodularity along the fissures. Visualized nodularity generally in the 2-3 mm range, although with a stable 3 by 4 mm left upper lobe pulmonary nodule on image 52 series 6. No enlarging nodule observed. Musculoskeletal: Faintly sclerotic or rim-sclerotic lesions observed in the thoracic spine and sternum presumably from previously treated metastatic disease, and not substantially changed compared to the prior exam. CT ABDOMEN PELVIS FINDINGS Hepatobiliary: Unremarkable Pancreas: Unremarkable Spleen: Unremarkable Adrenals/Urinary Tract: Unremarkable Stomach/Bowel: Unremarkable Vascular/Lymphatic: Unremarkable Reproductive: Unremarkable Other: No  supplemental non-categorized findings. Musculoskeletal: Scattered small sclerotic lesions unchanged from previous and likely a manifestation of remote treated osseous metastatic disease. Advanced for age spondylosis and degenerative disc disease at L4-5 and L5-S1. IMPRESSION: 1. No  findings of active malignancy. 2. Stable appearance of faint peripheral reticulonodular accentuation both lungs along with faint nodularity along the fissures, generally in the 2-3 mm diameter range, along with a stable 3 by 4 mm left upper lobe pulmonary nodule. 3. Scattered small sclerotic osseous lesions presumably from previously treated metastatic disease, and not substantially changed compared to the prior exam. 4. Advanced for age spondylosis and degenerative disc disease at L4-5 and L5-S1. Electronically Signed   By: Freida Jes M.D.   On: 08/12/2023 15:13     ASSESSMENT AND PLAN: This is a very pleasant 38 years old white female recently diagnosed with stage IV non-small cell lung cancer, adenocarcinoma with positive ALK gene translocation in March 2021.  The patient started treatment with Alecensa  on July 23, 2019 and within few days she started not significant improvement in her condition with less shortness of breath as well as decrease in supraclavicular lymphadenopathy.  She is currently on treatment with Alecensa  600 mg p.o. twice daily status post 29 months of treatment. The patient was lost to follow-up for the last 6 months but she continues to do fine and she is taking her medication with Alecensa  (Alectinib) as prescribed.  Her treatment was discontinued secondary to disease progression. The patient has started treatment with Lorlatinib  100 mg p.o. daily status post 20 months of treatment.  She has been tolerating this treatment well except for fatigue. She had repeat CT scan of the chest, abdomen and pelvis performed recently.  I personally and independently reviewed the scan and discussed the result with the patient today.  Her scan showed no concerning findings for disease progression.    Stage 4 non-small cell lung cancer, adenocarcinoma with ALK gene translocation Diagnosed in March 2021. Initially treated with alectinib for 29 months, discontinued due to disease  progression. Currently on lorlatinib  100 mg PO daily since August 2023, with good tolerance and no significant side effects. Recent CT scan of the chest, abdomen, and pelvis shows no evidence of disease progression. No new symptoms such as chest pain, dyspnea, or neurological symptoms. Discussed the importance of not altering treatment regimen with unverified methods such as increasing body alkalinity, as it may interfere with current treatment. Emphasized the need for regular monitoring due to potential brain metastases, despite lorlatinib 's CNS coverage. - Continue lorlatinib  100 mg PO daily - Schedule follow-up in two months with blood work - Order MRI in four months to monitor for potential brain metastases   The patient was advised to call immediately if she has any other concerning symptoms in the interval. The patient voices understanding of current disease status and treatment options and is in agreement with the current care plan.  All questions were answered. The patient knows to call the clinic with any problems, questions or concerns. We can certainly see the patient much sooner if necessary. The total time spent in the appointment was 30 minutes.  Disclaimer: This note was dictated with voice recognition software. Similar sounding words can inadvertently be transcribed and may not be corrected upon review.

## 2023-08-23 ENCOUNTER — Other Ambulatory Visit: Payer: Self-pay | Admitting: Pharmacy Technician

## 2023-08-23 ENCOUNTER — Other Ambulatory Visit: Payer: Self-pay

## 2023-08-23 NOTE — Progress Notes (Signed)
 Specialty Pharmacy Refill Coordination Note  [name] is a 38 y.o. female contacted today regarding refills of specialty medication(s) Lorlatinib  (LORBRENA )   Patient requested (Patient-Rptd) Pickup at [facility] date: (Patient-Rptd) 09/04/23   Medication will be filled on 09/03/23.

## 2023-09-03 ENCOUNTER — Other Ambulatory Visit (HOSPITAL_COMMUNITY): Payer: Self-pay

## 2023-09-03 ENCOUNTER — Other Ambulatory Visit: Payer: Self-pay

## 2023-09-03 NOTE — Progress Notes (Signed)
 Clinical Intervention Note  Clinical Intervention Notes: Patient's sister called and reports that the patient is sick with allergies or a head cold.  We discussed symptoms and possible OTC medications that are safe and appropriate to take including claritin , Mucinex  DM, and Sudafed.  She will discuss the specific symptoms more with her sister and they will call back if needed.  I also advised that if she develops a fever or starts to feel worse that they reach out the to the provider's office.  She was understanding.  All questions were answered.   Clinical Intervention Outcomes: Prevention of an adverse drug event   Celinda Collar

## 2023-09-12 ENCOUNTER — Ambulatory Visit (INDEPENDENT_AMBULATORY_CARE_PROVIDER_SITE_OTHER)

## 2023-09-12 VITALS — BP 134/84 | HR 89 | Temp 97.5°F | Ht 70.0 in | Wt 314.1 lb

## 2023-09-12 DIAGNOSIS — J40 Bronchitis, not specified as acute or chronic: Secondary | ICD-10-CM | POA: Diagnosis not present

## 2023-09-12 MED ORDER — AMOXICILLIN-POT CLAVULANATE 875-125 MG PO TABS: 1.0000 | ORAL_TABLET | Freq: Two times a day (BID) | ORAL | 0 refills | Status: DC

## 2023-09-12 MED ORDER — ALBUTEROL SULFATE HFA 108 (90 BASE) MCG/ACT IN AERS: 2.0000 | INHALATION_SPRAY | Freq: Four times a day (QID) | RESPIRATORY_TRACT | 1 refills | Status: AC | PRN

## 2023-09-12 NOTE — Progress Notes (Signed)
   Acute Office Visit  Subjective:     Patient ID: Sarah Chandler, female    DOB: 1985-06-18, 38 y.o.   MRN: [id number]  Chief Complaint  Patient presents with   Nasal Congestion    Onset: A WEEK AGO Meds taken: Mucinex  DM   Cough    HPI  Sarah Chandler is a 38 y.o. female who presents with a 8 day history of cough and nasal congestion.  She reports that the symptoms started out as seasonal allergy symptoms with runny nose, itchy eyes, itchy throat, mild congestion.  She reports that over the last few days she feels that the congestion has settled in her chest, states she is coughing a lot more and the cough is productive.  Of note the patient has a history of primary adenocarcinoma of the right lung.  She reports that she has been tried Mucinex  DM OTC for symptomatic management and reports that it has been helping, however her symptoms have persisted.  Denies sick contacts/recent travel  Denies CP/SOB/Wheeze    ROS Per HPI     Objective:    BP 134/84   Pulse 89   Temp (!) 97.5 F (36.4 C) (Oral)   Ht 5\' 10"  (1.778 m)   Wt (!) 314 lb 1.9 oz (142.5 kg)   LMP 09/02/2023   SpO2 96%   BMI 45.07 kg/m    Physical Exam HENT:     Mouth/Throat:     Mouth: Mucous membranes are moist.  Cardiovascular:     Rate and Rhythm: Normal rate and regular rhythm.  Pulmonary:     Effort: Pulmonary effort is normal.     Breath sounds: Rhonchi (Mild expiratory ronchi heard over the upper lobe of left lung) present.  Musculoskeletal:     Cervical back: Neck supple.  Skin:    General: Skin is warm and dry.  Neurological:     General: No focal deficit present.     Mental Status: She is alert.  Psychiatric:        Mood and Affect: Mood normal.        Thought Content: Thought content normal.    No results found for any visits on 09/12/23.      Assessment & Plan:  -Bronchitis:  -Give duration of symptoms and that symptoms are persistent even with OTC treatment, will start  augmentin  875-125 mg x 10 days for bronchitis.  -Advised patient to take this medication with food and to complete the entire course.  -Refilled albuterol  inhaler for her to have on hand for wheeze/chest tightness.  -Advised patient to continue Musinex DM to thin out secretions and drink plenty of water to augment the effects of this medication.  -Encouraged patient to go to the ER if her symptoms worsen, she develops a high fever, or chest tightness/SOB that doesn't resolve with the albuterol .    No follow-ups on file.  Odilia Bennett, PA-C

## 2023-09-12 NOTE — Patient Instructions (Signed)
 It was nice to see you today!  As we discussed in clinic: - Start Augmentin  twice per day for 7 days.  Please take this medication with food to avoid nausea/upset stomach. - Continue taking your Mucinex  DM.  This medication has guaifenesin  in it, which works well for breaking up chest congestion.  To maximize the benefits of this medication, please ensure that you are drinking plenty of water to thin out any secretions. - I have also sent in a refill of your albuterol  inhaler to have on hand for chest tightness/wheezing. -If symptoms worsen and you begin to feel consistently short of breath, wheezing that is not relieved by the albuterol  inhaler, or start running a high fever, please go straight to the ER for evaluation.  If you have any problems before your next visit feel free to message me via MyChart (minor issues or questions) or call the office, otherwise you may reach out to schedule an office visit.  Thank you! Meryl Acosta, PA-C

## 2023-09-12 NOTE — Assessment & Plan Note (Signed)
-  Give duration of symptoms and that symptoms are persistent even with OTC treatment, will start augmentin  875-125 mg x 10 days for bronchitis.  -Advised patient to take this medication with food and to complete the entire course.  -Refilled albuterol  inhaler for her to have on hand for wheeze/chest tightness.  -Advised patient to continue Musinex DM to thin out secretions and drink plenty of water to augment the effects of this medication.  -Encouraged patient to go to the ER if her symptoms worsen, she develops a high fever, or chest tightness/SOB that doesn't resolve with the albuterol .

## 2023-09-16 ENCOUNTER — Ambulatory Visit: Admitting: Family Medicine

## 2023-09-24 ENCOUNTER — Other Ambulatory Visit: Payer: Self-pay

## 2023-09-24 NOTE — Progress Notes (Signed)
 Specialty Pharmacy Ongoing Clinical Assessment Note  Sarah Chandler is a 38 y.o. female who is being followed by the specialty pharmacy service for RxSp Oncology   Patient's specialty medication(s) reviewed today: Lorlatinib  (LORBRENA )   Missed doses in the last 4 weeks: 0   Patient/Caregiver did not have any additional questions or concerns.   Therapeutic benefit summary: Patient is achieving benefit   Adverse events/side effects summary: No adverse events/side effects   Patient's therapy is appropriate to: Continue    Goals Addressed             This Visit's Progress    Stabilization of disease   On track    Patient is on track. Patient will maintain adherence. Per visit on 08/14/23, CT of chest, abdomen and pelvis showed no concerning findings for disease progression. Patient to have brain MRI in 4 months to check for brain metastasis         Follow up: 6 months  [facility]

## 2023-09-24 NOTE — Progress Notes (Signed)
 Specialty Pharmacy Refill Coordination Note  Sarah Chandler is a 38 y.o. female contacted today regarding refills of specialty medication(s) Lorlatinib  (LORBRENA )   Patient requested [name] at [facility] at [address] date: 09/27/23   Medication will be filled on 09/26/23.

## 2023-09-25 ENCOUNTER — Other Ambulatory Visit (HOSPITAL_COMMUNITY): Payer: Self-pay

## 2023-09-26 ENCOUNTER — Other Ambulatory Visit (HOSPITAL_COMMUNITY): Payer: Self-pay

## 2023-09-26 ENCOUNTER — Other Ambulatory Visit: Payer: Self-pay

## 2023-09-30 ENCOUNTER — Other Ambulatory Visit

## 2023-09-30 DIAGNOSIS — E559 Vitamin D deficiency, unspecified: Secondary | ICD-10-CM | POA: Diagnosis not present

## 2023-09-30 DIAGNOSIS — E039 Hypothyroidism, unspecified: Secondary | ICD-10-CM | POA: Diagnosis not present

## 2023-09-30 LAB — VITAMIN D 25 HYDROXY (VIT D DEFICIENCY, FRACTURES): Vit D, 25-Hydroxy: 100 ng/mL (ref 30–100)

## 2023-09-30 LAB — TSH: TSH: 4.5 m[IU]/L

## 2023-10-02 ENCOUNTER — Ambulatory Visit: Payer: Self-pay | Admitting: Internal Medicine

## 2023-10-09 ENCOUNTER — Other Ambulatory Visit: Payer: Self-pay

## 2023-10-13 NOTE — Progress Notes (Unsigned)
 [facility] OFFICE PROGRESS NOTE  Sarah Chandler LABOR, PA [address] [name] [address]  DIAGNOSIS: Stage IV (T3, N2, M1c) non-small cell lung cancer, adenocarcinoma with ALK gene translocation presented with multiple bilateral pulmonary nodules and masses as well as small mediastinal and bilateral axillary lymphadenopathy and metastatic disease to the brain diagnosed in March 2021.   PRIOR THERAPY: Alecensa  (Alectinib) 600 mg p.o. twice daily. First dose started on July 23 2019. Status post 29 months of treatment.   CURRENT THERAPY:  Lorlatinib  100 mg p.o. daily.  First dose was December 06, 2021 status post 23 months of treatment.   INTERVAL HISTORY: Sarah Chandler 38 y.o. female returns to the clinic today for a follow-up visit.  The patient is followed for her history of stage IV non-small cell lung cancer, adenocarcinoma.  She is currently on targeted treatment with Lorlatinib .  She has been on this for approximately 23 months. She takes Crestor  due to hyperlipidemia.   She was last seen in the clinic on 08/14/23 by Dr. Sherrod.    Today she denies any fever, chills, or night sweats. Her breathing is pretty good. She denies any chest pain, cough, or hemoptysis.  Denies any nausea, vomiting, diarrhea, or constipation.  Denies any rashes or skin changes. She denies headache or vistion changes.  The patient was first diagnosed she had 4 brain deposits that were noted on her staging brain MRI.  These had disappeared on subsequent neuroimaging.  Dr. Sherrod recommended surveillance neuroimaging in 4 months which would be expected at the end of August 2025 or early September 2025.  She is not having any balance changes or falls.  She is wanting to make sure that this is surveillance as opposed to having concerns for progression in the brain.  She is here today for evaluation and repeat blood work.     MEDICAL HISTORY: Past Medical History:  Diagnosis Date   nscl ca  dx'd 06/2019   Pneumonia 05/08/2019    ALLERGIES:  is allergic to iron.  MEDICATIONS:  Current Outpatient Medications  Medication Sig Dispense Refill   acetaminophen  (TYLENOL ) 500 MG tablet Take 1,000 mg by mouth every 6 (six) hours as needed for moderate pain or headache.     albuterol  (VENTOLIN  HFA) 108 (90 Base) MCG/ACT inhaler Inhale 2 puffs into the lungs every 6 (six) hours as needed for wheezing or shortness of breath. 8 g 1   amoxicillin -clavulanate (AUGMENTIN ) 875-125 MG tablet Take 1 tablet by mouth 2 (two) times daily. 20 tablet 0   desvenlafaxine  (PRISTIQ ) 50 MG 24 hr tablet TAKE 1 TABLET BY MOUTH EVERY DAY 90 tablet 0   hydrOXYzine  (ATARAX ) 25 MG tablet Take 1 tablet (25 mg total) by mouth 3 (three) times daily as needed for anxiety. **NEEDS APT FOR FURTHER REFILL** 30 tablet 0   levothyroxine  (SYNTHROID ) 125 MCG tablet Take 1 tablet (125 mcg total) by mouth daily. 90 tablet 3   lorlatinib  (LORBRENA ) 100 MG tablet Take 1 tablet (100 mg total) by mouth daily. Swallow tablets whole. Do not chew, crush or split tablets. 30 tablet 2   rosuvastatin  (CRESTOR ) 20 MG tablet TAKE 1 TABLET BY MOUTH EVERY DAY 90 tablet 2   No current facility-administered medications for this visit.    SURGICAL HISTORY:  Past Surgical History:  Procedure Laterality Date   BIOPSY  07/03/2019   Procedure: BIOPSY;  Surgeon: Jude Harden GAILS, MD;  Location: [facility] ENDOSCOPY;  Service: Cardiopulmonary;;  BRONCHIAL BRUSHINGS  07/03/2019   Procedure: BRONCHIAL BRUSHINGS;  Surgeon: Jude Harden GAILS, MD;  Location[facility] ENDOSCOPY;  Service: Cardiopulmonary;;   BRONCHIAL WASHINGS  07/03/2019   Procedure: BRONCHIAL WASHINGS;  Surgeon: Jude Harden GAILS, MD;  Location: [facility] ENDOSCOPY;  Service: Cardiopulmonary;;   TOOTH EXTRACTION     VIDEO BRONCHOSCOPY N/A 07/03/2019   Procedure: VIDEO BRONCHOSCOPY WITH FLUORO;  Surgeon: Jude Harden GAILS, MD;  Location: [facility] ENDOSCOPY;  Service: Cardiopulmonary;  Laterality: N/A;  LMA     REVIEW OF SYSTEMS:   Review of Systems  Constitutional: Negative for appetite change, chills, fatigue, and fever.  HENT: Negative for mouth sores, nosebleeds, sore throat and trouble swallowing.   Eyes: Negative for eye problems and icterus.  Respiratory:  Negative for cough, unusual shortness of breath, hemoptysis,  and wheezing.   Cardiovascular: Negative for chest pain and leg swelling.  Gastrointestinal: Negative for abdominal pain, constipation, diarrhea, nausea and vomiting.  Genitourinary: Negative for bladder incontinence, difficulty urinating, dysuria, frequency and hematuria.   Musculoskeletal: Negative for back pain, gait problem, neck pain and neck stiffness.  Skin: Negative for itching and rash.  Neurological: Negative for dizziness, extremity weakness, gait problem, headaches, light-headedness and seizures.  Hematological: Negative for adenopathy. Does not bruise/bleed easily.  Psychiatric/Behavioral: Negative for confusion, depression and sleep disturbance. The patient is not nervous/anxious.     PHYSICAL EXAMINATION:  There were no vitals taken for this visit.  ECOG PERFORMANCE STATUS: 1  Physical Exam  Constitutional: Oriented to person, place, and time and well-developed, well-nourished, and in no distress.  HENT:  Head: Normocephalic and atraumatic.  Mouth/Throat: Oropharynx is clear and moist. No oropharyngeal exudate.  Eyes: Conjunctivae are normal. Right eye exhibits no discharge. Left eye exhibits no discharge. No scleral icterus.  Neck: Normal range of motion. Neck supple.  Cardiovascular: Normal rate, regular rhythm, normal heart sounds and intact distal pulses.   Pulmonary/Chest: Effort normal and breath sounds normal. No respiratory distress. No wheezes. No rales.  Abdominal: Soft. Bowel sounds are normal. Exhibits no distension and no mass. There is no tenderness.  Musculoskeletal: Normal range of motion. Exhibits no edema.  Lymphadenopathy:    No  cervical adenopathy.  Neurological: Alert and oriented to person, place, and time. Exhibits normal muscle tone. Gait normal. Coordination normal.  Skin: Skin is warm and dry. No rash noted. Not diaphoretic. No erythema. No pallor.  Psychiatric: Mood, memory and judgment normal.  Vitals reviewed.  LABORATORY DATA: Lab Results  Component Value Date   WBC 8.5 10/15/2023   HGB 12.0 10/15/2023   HCT 36.5 10/15/2023   MCV 86.3 10/15/2023   PLT 261 10/15/2023      Chemistry      Component Value Date/Time   NA 141 08/05/2023 0727   NA 143 08/28/2021 0811   K 3.9 08/05/2023 0727   CL 107 08/05/2023 0727   CO2 26 08/05/2023 0727   BUN 12 08/05/2023 0727   BUN 8 08/28/2021 0811   CREATININE 0.69 08/05/2023 0727      Component Value Date/Time   CALCIUM  8.9 08/05/2023 0727   ALKPHOS 54 08/05/2023 0727   AST 26 08/05/2023 0727   ALT 34 08/05/2023 0727   BILITOT 0.3 08/05/2023 0727       RADIOGRAPHIC STUDIES:  No results found.   ASSESSMENT/PLAN:  This is a pleasant 38 year old caucasian female diagnosed with IV non-small cell lung cancer, adenocarcinoma with positive ALK gene translocation in March 2021.     The patient  started treatment with Alecensa  on July 23, 2019 and within few days she started not significant improvement in her condition with less shortness of breath as well as decrease in supraclavicular lymphadenopathy.  She is currently on treatment with Alecensa  600 mg p.o. twice daily status post 29 months of treatment.   The patient was lost to follow-up for the last 6 months but she continued to do fine and she was taking her medication with Alecensa  (Alectinib) as prescribed.   In August 2023, she had a CT scan showed progression of the diffuse bilateral pulmonary metastasis in addition to progressive diffuse sclerotic bone metastasis. Therefore, Dr. Sherrod recommended discontinuing her treatment with Alecensa  (Alectinib).    Therefore, she was started on  Lorlatinib  100 mg p.o. daily. She started this on 12/06/21. She has been on this for about 13 months.  She has been tolerating this well.     Labs were reviewed.  Recommend that she continue on the same treatment at the same dose.  We will see her back for labs and follow-up visit in 2 months.  I will arrange for restaging CT scan of the chest, abdomen, and pelvis 1 week prior to her appointment.   She will continue on crestor  for her hyperlipidemia. Her lipid panel is pending from today.   Dr. Sherrod recommended routine surveillance neuroimaging since her initial brain MRI in 2021 showed 4 deposits that had since resolved.  Therefore I will place an order for a brain MRI to be performed in approximately 2 months from now.  The patient was advised to call immediately if she has any concerning symptoms in the interval. The patient voices understanding of current disease status and treatment options and is in agreement with the current care plan. All questions were answered. The patient knows to call the clinic with any problems, questions or concerns. We can certainly see the patient much sooner if necessary    Orders Placed This Encounter  Procedures   CT CHEST ABDOMEN PELVIS W CONTRAST    Standing Status:   Future    Expected Date:   12/10/2023    Expiration Date:   10/14/2024    If indicated for the ordered procedure, I authorize the administration of contrast media per Radiology protocol:   Yes    Does the patient have a contrast media/X-ray dye allergy?:   No    Preferred imaging location?:   [facility]    If indicated for the ordered procedure, I authorize the administration of oral contrast media per Radiology protocol:   Yes   MR Brain W Wo Contrast    Standing Status:   Future    Expected Date:   12/12/2023    Expiration Date:   10/14/2024    If indicated for the ordered procedure, I authorize the administration of contrast media per Radiology protocol:   Yes    What is the  patient's sedation requirement?:   No Sedation    Does the patient have a pacemaker or implanted devices?:   No    Use SRS Protocol?:   No    Preferred imaging location?:   [facility] (table limit - 500lbs)   CBC with Differential (Cancer Center Only)    Standing Status:   Future    Expected Date:   12/12/2023    Expiration Date:   10/14/2024   CMP (Cancer Center only)    Standing Status:   Future    Expected Date:  12/12/2023    Expiration Date:   10/14/2024   Lipid panel    Standing Status:   Future    Expected Date:   12/12/2023    Expiration Date:   10/14/2024     The total time spent in the appointment was 20-29 minutes  Rylin Seavey L Callaway Hailes, PA-C 10/15/23

## 2023-10-15 ENCOUNTER — Other Ambulatory Visit

## 2023-10-15 ENCOUNTER — Inpatient Hospital Stay: Attending: Internal Medicine | Admitting: Physician Assistant

## 2023-10-15 ENCOUNTER — Ambulatory Visit: Admitting: Internal Medicine

## 2023-10-15 VITALS — BP 138/98 | HR 84 | Temp 98.0°F | Resp 15 | Wt 323.0 lb

## 2023-10-15 DIAGNOSIS — C3491 Malignant neoplasm of unspecified part of right bronchus or lung: Secondary | ICD-10-CM

## 2023-10-15 DIAGNOSIS — Z79899 Other long term (current) drug therapy: Secondary | ICD-10-CM | POA: Diagnosis not present

## 2023-10-15 DIAGNOSIS — C3431 Malignant neoplasm of lower lobe, right bronchus or lung: Secondary | ICD-10-CM | POA: Insufficient documentation

## 2023-10-15 DIAGNOSIS — E7800 Pure hypercholesterolemia, unspecified: Secondary | ICD-10-CM

## 2023-10-15 DIAGNOSIS — C7931 Secondary malignant neoplasm of brain: Secondary | ICD-10-CM | POA: Diagnosis not present

## 2023-10-15 DIAGNOSIS — E785 Hyperlipidemia, unspecified: Secondary | ICD-10-CM | POA: Diagnosis not present

## 2023-10-15 DIAGNOSIS — C7951 Secondary malignant neoplasm of bone: Secondary | ICD-10-CM | POA: Insufficient documentation

## 2023-10-15 LAB — CBC WITH DIFFERENTIAL (CANCER CENTER ONLY)
Abs Immature Granulocytes: 0.02 K/uL (ref 0.00–0.07)
Basophils Absolute: 0.1 K/uL (ref 0.0–0.1)
Basophils Relative: 1 %
Eosinophils Absolute: 0.5 K/uL (ref 0.0–0.5)
Eosinophils Relative: 6 %
HCT: 36.5 % (ref 36.0–46.0)
Hemoglobin: 12 g/dL (ref 12.0–15.0)
Immature Granulocytes: 0 %
Lymphocytes Relative: 37 %
Lymphs Abs: 3.2 K/uL (ref 0.7–4.0)
MCH: 28.4 pg (ref 26.0–34.0)
MCHC: 32.9 g/dL (ref 30.0–36.0)
MCV: 86.3 fL (ref 80.0–100.0)
Monocytes Absolute: 0.7 K/uL (ref 0.1–1.0)
Monocytes Relative: 8 %
Neutro Abs: 4.1 K/uL (ref 1.7–7.7)
Neutrophils Relative %: 48 %
Platelet Count: 261 K/uL (ref 150–400)
RBC: 4.23 MIL/uL (ref 3.87–5.11)
RDW: 13.4 % (ref 11.5–15.5)
WBC Count: 8.5 K/uL (ref 4.0–10.5)
nRBC: 0 % (ref 0.0–0.2)

## 2023-10-15 LAB — LIPID PANEL
Cholesterol: 197 mg/dL (ref 0–200)
HDL: 62 mg/dL (ref >40)
LDL Cholesterol: 95 mg/dL (ref 0–99)
Total CHOL/HDL Ratio: 3.2 ratio
Triglycerides: 199 mg/dL — ABNORMAL HIGH (ref <150)
VLDL: 40 mg/dL (ref 0–40)

## 2023-10-15 LAB — CMP (CANCER CENTER ONLY)
ALT: 30 U/L (ref 0–44)
AST: 20 U/L (ref 15–41)
Albumin: 3.9 g/dL (ref 3.5–5.0)
Alkaline Phosphatase: 56 U/L (ref 38–126)
Anion gap: 6 (ref 5–15)
BUN: 9 mg/dL (ref 6–20)
CO2: 28 mmol/L (ref 22–32)
Calcium: 9.1 mg/dL (ref 8.9–10.3)
Chloride: 108 mmol/L (ref 98–111)
Creatinine: 0.7 mg/dL (ref 0.44–1.00)
GFR, Estimated: >60 mL/min (ref >60)
Glucose, Bld: 117 mg/dL — ABNORMAL HIGH (ref 70–99)
Potassium: 4.2 mmol/L (ref 3.5–5.1)
Sodium: 142 mmol/L (ref 135–145)
Total Bilirubin: 0.2 mg/dL (ref 0.0–1.2)
Total Protein: 7 g/dL (ref 6.5–8.1)

## 2023-10-15 LAB — LACTATE DEHYDROGENASE: LDH: 236 U/L — ABNORMAL HIGH (ref 98–192)

## 2023-10-18 ENCOUNTER — Encounter (INDEPENDENT_AMBULATORY_CARE_PROVIDER_SITE_OTHER): Payer: Self-pay

## 2023-10-18 ENCOUNTER — Other Ambulatory Visit: Payer: Self-pay | Admitting: Pharmacy Technician

## 2023-10-18 ENCOUNTER — Other Ambulatory Visit (HOSPITAL_COMMUNITY): Payer: Self-pay

## 2023-10-18 ENCOUNTER — Other Ambulatory Visit: Payer: Self-pay

## 2023-10-18 ENCOUNTER — Other Ambulatory Visit: Payer: Self-pay | Admitting: Internal Medicine

## 2023-10-18 DIAGNOSIS — C3491 Malignant neoplasm of unspecified part of right bronchus or lung: Secondary | ICD-10-CM

## 2023-10-18 MED ORDER — LORLATINIB 100 MG PO TABS
100.0000 mg | ORAL_TABLET | Freq: Every day | ORAL | 2 refills | Status: DC
  Filled 2023-10-21: qty 30, 30d supply, fill #0
  Filled 2023-11-19: qty 30, 30d supply, fill #1
  Filled 2023-12-20: qty 30, 30d supply, fill #2

## 2023-10-18 NOTE — Progress Notes (Signed)
 Specialty Pharmacy Refill Coordination Note  Sarah Chandler is a 38 y.o. female contacted today regarding refills of specialty medication(s)  Lorlatinib  (LORBRENA )     Patient requested (Patient-Rptd) Pickup at [facility] date: (Patient-Rptd) 10/31/23   Medication will be filled on 10/30/23.    RR sent to MD

## 2023-10-21 ENCOUNTER — Other Ambulatory Visit: Payer: Self-pay

## 2023-10-21 ENCOUNTER — Other Ambulatory Visit (HOSPITAL_COMMUNITY): Payer: Self-pay

## 2023-10-24 ENCOUNTER — Ambulatory Visit (HOSPITAL_COMMUNITY)
Admission: RE | Admit: 2023-10-24 | Discharge: 2023-10-24 | Disposition: A | Discharge status: Home / Self Care | Source: Ambulatory Visit | Attending: Physician Assistant | Admitting: Physician Assistant

## 2023-10-24 DIAGNOSIS — C3491 Malignant neoplasm of unspecified part of right bronchus or lung: Secondary | ICD-10-CM | POA: Diagnosis not present

## 2023-10-24 MED ORDER — GADOBUTROL 1 MMOL/ML IV SOLN
10.0000 mL | Freq: Once | INTRAVENOUS | Status: AC | PRN
  Administered 2023-10-24: 10 mL via INTRAVENOUS

## 2023-10-25 ENCOUNTER — Ambulatory Visit: Payer: Self-pay | Admitting: Family Medicine

## 2023-10-27 ENCOUNTER — Other Ambulatory Visit: Payer: Self-pay | Admitting: Family Medicine

## 2023-10-27 DIAGNOSIS — F411 Generalized anxiety disorder: Secondary | ICD-10-CM

## 2023-10-27 DIAGNOSIS — F321 Major depressive disorder, single episode, moderate: Secondary | ICD-10-CM

## 2023-10-28 ENCOUNTER — Telehealth: Payer: Self-pay

## 2023-10-28 NOTE — Telephone Encounter (Signed)
 Spoke with patient in regards to brain MRI results. Per Cassie, PA-  brain MRI showed no cancer or metastatic diease to the brain. Patient voiced understanding.

## 2023-10-30 ENCOUNTER — Other Ambulatory Visit: Payer: Self-pay

## 2023-11-09 ENCOUNTER — Other Ambulatory Visit (HOSPITAL_COMMUNITY): Payer: Self-pay

## 2023-11-19 ENCOUNTER — Encounter (INDEPENDENT_AMBULATORY_CARE_PROVIDER_SITE_OTHER): Payer: Self-pay

## 2023-11-19 ENCOUNTER — Other Ambulatory Visit: Payer: Self-pay

## 2023-11-19 NOTE — Progress Notes (Signed)
 Specialty Pharmacy Refill Coordination Note  Sarah Chandler is a 38 y.o. female contacted today regarding refills of specialty medication(s) Lorlatinib  (LORBRENA )   Patient requested (Patient-Rptd) Pickup at [facility] date: (Patient-Rptd) 11/28/23   Medication will be filled on 11/27/23.

## 2023-11-27 ENCOUNTER — Other Ambulatory Visit: Payer: Self-pay

## 2023-12-10 ENCOUNTER — Other Ambulatory Visit

## 2023-12-11 ENCOUNTER — Other Ambulatory Visit

## 2023-12-11 ENCOUNTER — Ambulatory Visit (HOSPITAL_COMMUNITY)

## 2023-12-16 ENCOUNTER — Ambulatory Visit (HOSPITAL_COMMUNITY)

## 2023-12-16 ENCOUNTER — Inpatient Hospital Stay

## 2023-12-16 ENCOUNTER — Encounter (HOSPITAL_COMMUNITY): Payer: Self-pay

## 2023-12-16 ENCOUNTER — Ambulatory Visit (HOSPITAL_COMMUNITY)
Admission: RE | Admit: 2023-12-16 | Discharge: 2023-12-16 | Disposition: A | Discharge status: Home / Self Care | Source: Ambulatory Visit | Attending: Physician Assistant | Admitting: Physician Assistant

## 2023-12-16 ENCOUNTER — Inpatient Hospital Stay: Attending: Internal Medicine

## 2023-12-16 DIAGNOSIS — C7951 Secondary malignant neoplasm of bone: Secondary | ICD-10-CM | POA: Diagnosis not present

## 2023-12-16 DIAGNOSIS — E7800 Pure hypercholesterolemia, unspecified: Secondary | ICD-10-CM | POA: Insufficient documentation

## 2023-12-16 DIAGNOSIS — C3431 Malignant neoplasm of lower lobe, right bronchus or lung: Secondary | ICD-10-CM | POA: Diagnosis present

## 2023-12-16 DIAGNOSIS — C3491 Malignant neoplasm of unspecified part of right bronchus or lung: Secondary | ICD-10-CM | POA: Insufficient documentation

## 2023-12-16 DIAGNOSIS — C7931 Secondary malignant neoplasm of brain: Secondary | ICD-10-CM | POA: Diagnosis not present

## 2023-12-16 DIAGNOSIS — C3490 Malignant neoplasm of unspecified part of unspecified bronchus or lung: Secondary | ICD-10-CM | POA: Diagnosis not present

## 2023-12-16 DIAGNOSIS — K8020 Calculus of gallbladder without cholecystitis without obstruction: Secondary | ICD-10-CM | POA: Diagnosis not present

## 2023-12-16 LAB — CBC WITH DIFFERENTIAL (CANCER CENTER ONLY)
Abs Immature Granulocytes: 0.02 K/uL (ref 0.00–0.07)
Basophils Absolute: 0 K/uL (ref 0.0–0.1)
Basophils Relative: 1 %
Eosinophils Absolute: 0.3 K/uL (ref 0.0–0.5)
Eosinophils Relative: 3 %
HCT: 38.8 % (ref 36.0–46.0)
Hemoglobin: 12.9 g/dL (ref 12.0–15.0)
Immature Granulocytes: 0 %
Lymphocytes Relative: 34 %
Lymphs Abs: 2.8 K/uL (ref 0.7–4.0)
MCH: 27.7 pg (ref 26.0–34.0)
MCHC: 33.2 g/dL (ref 30.0–36.0)
MCV: 83.4 fL (ref 80.0–100.0)
Monocytes Absolute: 0.7 K/uL (ref 0.1–1.0)
Monocytes Relative: 9 %
Neutro Abs: 4.5 K/uL (ref 1.7–7.7)
Neutrophils Relative %: 53 %
Platelet Count: 270 K/uL (ref 150–400)
RBC: 4.65 MIL/uL (ref 3.87–5.11)
RDW: 13.1 % (ref 11.5–15.5)
WBC Count: 8.3 K/uL (ref 4.0–10.5)
nRBC: 0 % (ref 0.0–0.2)

## 2023-12-16 LAB — CMP (CANCER CENTER ONLY)
ALT: 31 U/L (ref 0–44)
AST: 21 U/L (ref 15–41)
Albumin: 4.5 g/dL (ref 3.5–5.0)
Alkaline Phosphatase: 67 U/L (ref 38–126)
Anion gap: 9 (ref 5–15)
BUN: 9 mg/dL (ref 6–20)
CO2: 25 mmol/L (ref 22–32)
Calcium: 9.3 mg/dL (ref 8.9–10.3)
Chloride: 106 mmol/L (ref 98–111)
Creatinine: 0.61 mg/dL (ref 0.44–1.00)
GFR, Estimated: >60 mL/min (ref >60)
Glucose, Bld: 91 mg/dL (ref 70–99)
Potassium: 3.9 mmol/L (ref 3.5–5.1)
Sodium: 140 mmol/L (ref 135–145)
Total Bilirubin: 0.3 mg/dL (ref 0.0–1.2)
Total Protein: 7.8 g/dL (ref 6.5–8.1)

## 2023-12-16 LAB — LIPID PANEL
Cholesterol: 212 mg/dL — ABNORMAL HIGH (ref 0–200)
HDL: 54 mg/dL (ref >40)
LDL Cholesterol: 114 mg/dL — ABNORMAL HIGH (ref 0–99)
Total CHOL/HDL Ratio: 3.9 ratio
Triglycerides: 220 mg/dL — ABNORMAL HIGH (ref <150)
VLDL: 44 mg/dL — ABNORMAL HIGH (ref 0–40)

## 2023-12-16 MED ORDER — SODIUM CHLORIDE (PF) 0.9 % IJ SOLN
INTRAMUSCULAR | Status: AC
  Filled 2023-12-16: qty 50

## 2023-12-16 MED ORDER — IOHEXOL 300 MG/ML  SOLN
100.0000 mL | Freq: Once | INTRAMUSCULAR | Status: AC | PRN
  Administered 2023-12-16: 100 mL via INTRAVENOUS

## 2023-12-17 ENCOUNTER — Inpatient Hospital Stay (HOSPITAL_BASED_OUTPATIENT_CLINIC_OR_DEPARTMENT_OTHER): Admitting: Internal Medicine

## 2023-12-17 VITALS — BP 142/87 | HR 78 | Temp 97.4°F | Resp 17 | Ht 70.0 in | Wt 320.8 lb

## 2023-12-17 DIAGNOSIS — C7931 Secondary malignant neoplasm of brain: Secondary | ICD-10-CM | POA: Diagnosis not present

## 2023-12-17 DIAGNOSIS — C3491 Malignant neoplasm of unspecified part of right bronchus or lung: Secondary | ICD-10-CM

## 2023-12-17 NOTE — Progress Notes (Signed)
 [facility] [phone]   [phone]  OFFICE PROGRESS NOTE  [name] LABOR, PA [address] [name] [address]  DIAGNOSIS: Stage IV (T3, N2, M1c) non-small cell lung cancer, adenocarcinoma with ALK gene translocation presented with multiple bilateral pulmonary nodules and masses as well as small mediastinal and bilateral axillary lymphadenopathy and metastatic disease to the brain diagnosed in March 2021.  PRIOR THERAPY: Alecensa  (Alectinib) 600 mg p.o. twice daily.  First dose started on July 23 2019.  Status post 29 months of treatment.  CURRENT THERAPY: Lorlatinib  100 mg p.o. daily.  First dose was December 06, 2021 status post 22 months of treatment.  INTERVAL HISTORY: Sarah Chandler 38 y.o. female returns to the clinic today for follow-up visit. Discussed the use of AI scribe software for clinical note transcription with the patient, who gave verbal consent to proceed.  History of Present Illness Sarah Chandler is a 38 year old female with stage four non-small cell lung cancer who presents for evaluation with repeat CT scan for restaging of her disease.  She was diagnosed with stage four non-small cell lung cancer, adenocarcinoma, in March 2021. Initially treated with alectinib for 29 months, she transitioned to lorlatinib  due to disease progression and has been on it for 22 months without side effects. No new symptoms have developed since her last visit.  She manages high cholesterol with Crestor .  During a previous evaluation, small right ovarian follicles or pathologic cysts were noted, which are common and not currently causing symptoms. Additionally, she has asymptomatic gallstones.  She mentions being busy with family responsibilities, which has limited her engagement in art activities.    MEDICAL HISTORY: Past Medical History:  Diagnosis Date   nscl ca dx'd 06/2019   Pneumonia 05/08/2019    ALLERGIES:  is  allergic to iron.  MEDICATIONS:  Current Outpatient Medications  Medication Sig Dispense Refill   acetaminophen  (TYLENOL ) 500 MG tablet Take 1,000 mg by mouth every 6 (six) hours as needed for moderate pain or headache.     albuterol  (VENTOLIN  HFA) 108 (90 Base) MCG/ACT inhaler Inhale 2 puffs into the lungs every 6 (six) hours as needed for wheezing or shortness of breath. 8 g 1   amoxicillin -clavulanate (AUGMENTIN ) 875-125 MG tablet Take 1 tablet by mouth 2 (two) times daily. 20 tablet 0   desvenlafaxine  (PRISTIQ ) 50 MG 24 hr tablet TAKE 1 TABLET BY MOUTH EVERY DAY 90 tablet 0   hydrOXYzine  (ATARAX ) 25 MG tablet Take 1 tablet (25 mg total) by mouth 3 (three) times daily as needed for anxiety. **NEEDS APT FOR FURTHER REFILL** 30 tablet 0   levothyroxine  (SYNTHROID ) 125 MCG tablet Take 1 tablet (125 mcg total) by mouth daily. 90 tablet 3   lorlatinib  (LORBRENA ) 100 MG tablet Take 1 tablet (100 mg total) by mouth daily. Swallow tablets whole. Do not chew, crush or split tablets. 30 tablet 2   rosuvastatin  (CRESTOR ) 20 MG tablet TAKE 1 TABLET BY MOUTH EVERY DAY 90 tablet 2   No current facility-administered medications for this visit.    SURGICAL HISTORY:  Past Surgical History:  Procedure Laterality Date   BIOPSY  07/03/2019   Procedure: BIOPSY;  Surgeon: Jude Harden GAILS, MD;  Location[facility] ENDOSCOPY;  Service: Cardiopulmonary;;   BRONCHIAL BRUSHINGS  07/03/2019   Procedure: BRONCHIAL BRUSHINGS;  Surgeon: Jude Harden GAILS, MD;  Location: [facility] ENDOSCOPY;  Service: Cardiopulmonary;;   BRONCHIAL WASHINGS  07/03/2019  Procedure: BRONCHIAL WASHINGS;  Surgeon: Jude Harden GAILS, MD;  Location: [facility] ENDOSCOPY;  Service: Cardiopulmonary;;   TOOTH EXTRACTION     VIDEO BRONCHOSCOPY N/A 07/03/2019   Procedure: VIDEO BRONCHOSCOPY WITH FLUORO;  Surgeon: Jude Harden GAILS, MD;  Location: [facility] ENDOSCOPY;  Service: Cardiopulmonary;  Laterality: N/A;  LMA    REVIEW OF SYSTEMS:  Constitutional: positive for  fatigue Eyes: negative Ears, nose, mouth, throat, and face: negative Respiratory: negative Cardiovascular: negative Gastrointestinal: negative Genitourinary:negative Integument/breast: negative Hematologic/lymphatic: negative Musculoskeletal:negative Neurological: negative Behavioral/Psych: negative Endocrine: negative Allergic/Immunologic: negative   PHYSICAL EXAMINATION: General appearance: alert, cooperative, fatigued, and no distress Head: Normocephalic, without obvious abnormality, atraumatic Neck: no adenopathy, no JVD, supple, symmetrical, trachea midline, and thyroid  not enlarged, symmetric, no tenderness/mass/nodules Lymph nodes: Cervical, supraclavicular, and axillary nodes normal. Resp: clear to auscultation bilaterally Back: symmetric, no curvature. ROM normal. No CVA tenderness. Cardio: regular rate and rhythm, S1, S2 normal, no murmur, click, rub or gallop GI: soft, non-tender; bowel sounds normal; no masses,  no organomegaly Extremities: extremities normal, atraumatic, no cyanosis or edema Neurologic: Alert and oriented X 3, normal strength and tone. Normal symmetric reflexes. Normal coordination and gait  ECOG PERFORMANCE STATUS: 1 - Symptomatic but completely ambulatory  Blood pressure (!) 142/87, pulse 78, temperature (!) 97.4 F (36.3 C), resp. rate 17, height 5' 10 (1.778 m), weight (!) 320 lb 12.8 oz (145.5 kg), last menstrual period 12/04/2023, SpO2 98%.  LABORATORY DATA: Lab Results  Component Value Date   WBC 8.3 12/16/2023   HGB 12.9 12/16/2023   HCT 38.8 12/16/2023   MCV 83.4 12/16/2023   PLT 270 12/16/2023      Chemistry      Component Value Date/Time   NA 140 12/16/2023 1405   NA 143 08/28/2021 0811   K 3.9 12/16/2023 1405   CL 106 12/16/2023 1405   CO2 25 12/16/2023 1405   BUN 9 12/16/2023 1405   BUN 8 08/28/2021 0811   CREATININE 0.61 12/16/2023 1405      Component Value Date/Time   CALCIUM  9.3 12/16/2023 1405   ALKPHOS 67  12/16/2023 1405   AST 21 12/16/2023 1405   ALT 31 12/16/2023 1405   BILITOT 0.3 12/16/2023 1405       RADIOGRAPHIC STUDIES: CT CHEST ABDOMEN PELVIS W CONTRAST Result Date: 12/16/2023 CLINICAL DATA:  Non-small cell lung cancer.  * Tracking Code: BO * EXAM: CT CHEST, ABDOMEN, AND PELVIS WITH CONTRAST TECHNIQUE: Multidetector CT imaging of the chest, abdomen and pelvis was performed following the standard protocol during bolus administration of intravenous contrast. RADIATION DOSE REDUCTION: This exam was performed according to the departmental dose-optimization program which includes automated exposure control, adjustment of the mA and/or kV according to patient size and/or use of iterative reconstruction technique. CONTRAST:  OMNIPAQUE  IOHEXOL  300 MG/ML  SOLN COMPARISON:  08/05/2023. FINDINGS: CT CHEST FINDINGS Cardiovascular: Heart is at the upper limits of normal in size to mildly enlarged. No pericardial effusion. Mediastinum/Nodes: No pathologically enlarged mediastinal, hilar or axillary lymph nodes. Esophagus is grossly unremarkable. Lungs/Pleura: Similar mild perilymphatic thickening and nodularity. No discrete pulmonary nodules. No pleural fluid. Airway is unremarkable. Musculoskeletal: Scattered areas of sclerotic mottling, as before. CT ABDOMEN PELVIS FINDINGS Hepatobiliary: Liver is unremarkable. Gallstones. No biliary ductal dilatation. Pancreas: Negative. Spleen: Negative. Adrenals/Urinary Tract: Adrenal glands and kidneys are unremarkable. Ureters are decompressed. Bladder is grossly unremarkable. Stomach/Bowel: Stomach, small bowel, appendix and colon are unremarkable. Vascular/Lymphatic: Vascular structures are unremarkable. No pathologically enlarged lymph nodes. Reproductive: Uterus is  visualized. Small right ovarian follicles or physiologic cysts. Other: No free fluid.  Mesenteries and peritoneum are unremarkable. Musculoskeletal: Degenerative changes in the spine. Scattered areas of  sclerotic mottling. IMPRESSION: 1. 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 2. Treated osseous metastatic disease. 3. Cholelithiasis. Electronically Signed   By: Newell Eke M.D.   On: 12/16/2023 17:14     ASSESSMENT AND PLAN: This is a very pleasant 38 years old white female recently diagnosed with stage IV non-small cell lung cancer, adenocarcinoma with positive ALK gene translocation in March 2021.  The patient started treatment with Alecensa  on July 23, 2019 and within few days she started not significant improvement in her condition with less shortness of breath as well as decrease in supraclavicular lymphadenopathy.  She is currently on treatment with Alecensa  600 mg p.o. twice daily status post 29 months of treatment. The patient was lost to follow-up for the last 6 months but she continues to do fine and she is taking her medication with Alecensa  (Alectinib) as prescribed.  Her treatment was discontinued secondary to disease progression. The patient has started treatment with Lorlatinib  100 mg p.o. daily status post 22 months of treatment.  She has been tolerating this treatment well except for fatigue. She had repeat CT scan of the chest, abdomen and pelvis performed recently.  I personally and independently reviewed the scan and discussed the result with the patient today.  Her scan showed no concerning findings for disease progression. Assessment and Plan Assessment & Plan Stage IV non-small cell lung cancer, adenocarcinoma Initially treated with alectinib for 29 months, followed by lorlatinib  for 22 months due to disease progression. Current CT scan shows no growth or spread, indicating well-managed disease status. No reported side effects from lorlatinib . - Continue lorlatinib  100 mg PO daily - Provide information about the 5K event in [place] on October 4th for lung cancer patients and survivors  Hyperlipidemia Managed  with Crestor . Current lab work shows slightly elevated cholesterol levels, but not severely high. Decision made to monitor without increasing medication to avoid potential side effects. - Continue Crestor  as prescribed  Cholelithiasis (gallstones) Presence of gallstones with no current symptoms or pain reported. - Advise to seek emergency care if right-sided pain occurs  Right ovarian cysts/follicles Small right ovarian follicles or pathologic cysts visualized on ultrasound. Considered common and not of concern unless symptoms develop. - Monitor for symptoms related to ovarian cysts She was advised to call immediately if she has any concerning symptoms in the interval.  The patient voices understanding of current disease status and treatment options and is in agreement with the current care plan.  All questions were answered. The patient knows to call the clinic with any problems, questions or concerns. We can certainly see the patient much sooner if necessary. The total time spent in the appointment was 30 minutes.  Disclaimer: This note was dictated with voice recognition software. Similar sounding words can inadvertently be transcribed and may not be corrected upon review.

## 2023-12-20 ENCOUNTER — Other Ambulatory Visit (HOSPITAL_COMMUNITY): Payer: Self-pay

## 2023-12-20 ENCOUNTER — Other Ambulatory Visit: Payer: Self-pay

## 2023-12-20 ENCOUNTER — Encounter (INDEPENDENT_AMBULATORY_CARE_PROVIDER_SITE_OTHER): Payer: Self-pay

## 2023-12-20 NOTE — Progress Notes (Signed)
 Specialty Pharmacy Refill Coordination Note  MyChart Questionnaire Submission  Sarah Chandler is a 38 y.o. female contacted today regarding refills of specialty medication(s) Lorbrena .  Doses on hand: (Patient-Rptd) ~1-2 weeks   Patient requested: (Patient-Rptd) Delivery   Delivery date: 12/24/23  Verified address: [address] [place],  [place] [address]  Medication will be filled on 12/23/23.

## 2023-12-23 ENCOUNTER — Other Ambulatory Visit (INDEPENDENT_AMBULATORY_CARE_PROVIDER_SITE_OTHER): Payer: Self-pay

## 2023-12-23 ENCOUNTER — Ambulatory Visit: Admitting: Orthopaedic Surgery

## 2023-12-23 ENCOUNTER — Other Ambulatory Visit: Payer: Self-pay

## 2023-12-23 VITALS — Wt 321.0 lb

## 2023-12-23 DIAGNOSIS — G8929 Other chronic pain: Secondary | ICD-10-CM

## 2023-12-23 DIAGNOSIS — M25561 Pain in right knee: Secondary | ICD-10-CM

## 2023-12-23 NOTE — Progress Notes (Signed)
 The patient is a 38 year old female who comes in today with right knee pain has been hurting for some time now with some swelling and pain when standing too long as well as popping and cracking.  She did have a remote history of that knee at 1 point but has never had surgery or injections in the knee.  She has not had any type of therapy either.  She does state that the right knee has some swelling when compared to left knee.  I was able to review her past medical history and medications within epic.  One of her comorbidities is morbid obesity with a BMI of 46.06.  On examination her right knee does have some slight swelling when comparing the right and left knees.  Both knees slightly hyperextend and there is definitely some patellofemoral crepitation.  The right knee feels ligamentously stable but I do believe both patellas track slightly laterally.  An x-ray standing shows both knees on the AP view and the right knee on the lateral view.  There is no acute findings.  I do believe the patellas track slightly laterally.  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  I did offer a steroid injection of the right knee today but she has deferred this appropriately 1 to see what therapy does for her.  We will work on setting her up for outpatient physical therapy for her knees and then we will see her back in 6 weeks for follow-up.

## 2023-12-24 ENCOUNTER — Other Ambulatory Visit: Payer: Self-pay

## 2023-12-24 DIAGNOSIS — G8929 Other chronic pain: Secondary | ICD-10-CM

## 2024-01-01 ENCOUNTER — Ambulatory Visit (INDEPENDENT_AMBULATORY_CARE_PROVIDER_SITE_OTHER)

## 2024-01-01 VITALS — BP 112/77 | HR 96 | Temp 97.7°F | Ht 70.0 in | Wt 316.0 lb

## 2024-01-01 DIAGNOSIS — E66813 Obesity, class 3: Secondary | ICD-10-CM | POA: Diagnosis not present

## 2024-01-01 DIAGNOSIS — F321 Major depressive disorder, single episode, moderate: Secondary | ICD-10-CM | POA: Diagnosis not present

## 2024-01-01 DIAGNOSIS — Z6841 Body Mass Index (BMI) 40.0 and over, adult: Secondary | ICD-10-CM | POA: Diagnosis not present

## 2024-01-01 MED ORDER — BUSPIRONE HCL 5 MG PO TABS: 5.0000 mg | ORAL_TABLET | Freq: Two times a day (BID) | ORAL | 2 refills | Status: DC

## 2024-01-01 MED ORDER — BUPROPION HCL ER (XL) 150 MG PO TB24: 150.0000 mg | ORAL_TABLET | Freq: Every day | ORAL | 2 refills | Status: DC

## 2024-01-01 NOTE — Progress Notes (Signed)
 Established Patient Office Visit  Subjective   Patient ID: Sarah Chandler, female    DOB: 01-30-1986  Age: 38 y.o. MRN: [id number]  Chief Complaint  Patient presents with   Medical Management of Chronic Issues    HPI  History of Present Illness   Sarah Chandler is a 38 year old female with anxiety and ADHD who presents with mental health concerns.  Anxiety and psychosocial stressors - Significant anxiety and stress interfering with daily life - Persistent inability to relax or feel authentic, describing herself as 'being everybody else' - Feelings of being overwhelmed by grief and stress - Impaired interpersonal relationships, including conflicts with grandmother and feeling burdened by mother's issues  Attention deficit hyperactivity disorder (adhd) symptoms - History of ADHD, challenging to manage in conjunction with anxiety  Psychotropic medication use and side effects - Previously trialed Pristiq  without noticeable benefit - Atarax  used as needed for anxiety, effective but causes significant drowsiness and impacts ability to function - No current use of Pristiq  - No need for additional Atarax  for sleep at this time  Sleep disturbance - Insomnia, with difficulty initiating sleep (did not fall asleep until midnight the previous night)  Oncologic treatment and other medications - Ongoing chemotherapy, reported as going well - Current medications include levothyroxine  125 mcg and rosuvastatin , both prescribed by oncologist - Completed a course of Augmentin           ROS Per HPI.    Objective:     BP 112/77   Pulse 96   Temp 97.7 F (36.5 C) (Oral)   Ht 5' 10 (1.778 m)   Wt (!) 316 lb 0.6 oz (143.4 kg)   LMP 12/04/2023 (Approximate)   SpO2 97%   BMI 45.35 kg/m    Physical Exam Constitutional:      General: She is not in acute distress.    Appearance: Normal appearance.  Cardiovascular:     Rate and Rhythm: Normal rate and regular rhythm.      Heart sounds: Normal heart sounds. No murmur heard.    No friction rub. No gallop.  Pulmonary:     Effort: Pulmonary effort is normal. No respiratory distress.     Breath sounds: Normal breath sounds.  Musculoskeletal:        General: No swelling.  Skin:    General: Skin is warm and dry.  Neurological:     General: No focal deficit present.     Mental Status: She is alert.  Psychiatric:        Mood and Affect: Mood normal.        Behavior: Behavior normal.        Thought Content: Thought content normal.       No results found for any visits on 01/01/24.    The ASCVD Risk score (Arnett DK, et al., 2019) failed to calculate for the following reasons:   The 2019 ASCVD risk score is only valid for ages 36 to 82    Assessment & Plan:   Depression, major, single episode, moderate (HCC) Assessment & Plan: Chronic depression and generalized anxiety disorder with comorbid ADHD and insomnia Pristiq  was ineffective. Atarax  causes sedation but works well for acute anxiety. Wellbutrin  considered for depression, anxiety, ADHD. Buspar  considered for anxiety without sedation. - Initiate Wellbutrin  150 mg extended-release daily. Increase to 300 mg if tolerated and effective. - Initiate Buspar  5 mg twice daily. Increase dose if ineffective after two weeks. - Continue Atarax  as needed for acute anxiety  or sleep aid. - Provide resources for virtual therapy covered by Medicaid. - Monitor for side effects: jitteriness with Wellbutrin , nausea with Buspar .  - Follow up in 3 months for CPE and f/u on mood.      Other orders -     buPROPion  HCl ER (XL); Take 1 tablet (150 mg total) by mouth daily.  Dispense: 30 tablet; Refill: 2 -     busPIRone  HCl; Take 1 tablet (5 mg total) by mouth 2 (two) times daily.  Dispense: 60 tablet; Refill: 2    Return in about 3 months (around 04/01/2024) for Physical, mood f/u.    Saddie JULIANNA Sacks, PA-C

## 2024-01-01 NOTE — Patient Instructions (Addendum)
 It was nice to see you today!  As we discussed in clinic:  - I have sent in 150 mg of Wellbutrin  for you to take once per day for depression/ADHD symptoms. Please monitor for side effects including increased jitteriness or agitation. This medication can also help with weight loss so if you notice this, it is likely due to the medication.   - I have also sent in Buspar  5 mg twice daily to help with anxiety. The most common side effect of this medication is nausea, so make sure you take it with food! This is a very small dose so we can always increase the dose if you tolerate it well.   - Continue with the Atarax  as needed for acute anxiety attacks and sleep.   - The websites to check out for virtual therapy are: [facility], [employer], and [employer].   I will see you back in 3 months for a follow up!   If you have any problems before your next visit feel free to message me via MyChart (minor issues or questions) or call the office, otherwise you may reach out to schedule an office visit.  Thank you! Saddie Sacks, PA-C

## 2024-01-01 NOTE — Assessment & Plan Note (Signed)
 Discussed modest weight loss effect with Wellbutrin .  Current weight: 316

## 2024-01-01 NOTE — Assessment & Plan Note (Signed)
 Chronic depression and generalized anxiety disorder with comorbid ADHD and insomnia Pristiq  was ineffective. Atarax  causes sedation but works well for acute anxiety. Wellbutrin  considered for depression, anxiety, ADHD. Buspar  considered for anxiety without sedation. - Initiate Wellbutrin  150 mg extended-release daily. Increase to 300 mg if tolerated and effective. - Initiate Buspar  5 mg twice daily. Increase dose if ineffective after two weeks. - Continue Atarax  as needed for acute anxiety or sleep aid. - Provide resources for virtual therapy covered by Medicaid. - Monitor for side effects: jitteriness with Wellbutrin , nausea with Buspar .  - Follow up in 3 months for CPE and f/u on mood.

## 2024-01-03 NOTE — Therapy (Signed)
 OUTPATIENT PHYSICAL THERAPY LOWER EXTREMITY EVALUATION   Patient Name: Sarah Chandler MRN: [id number] DOB:04-Jan-1986, 38 y.o., female Today's Date: 01/06/2024  END OF SESSION:  PT End of Session - 01/06/24 0841     Visit Number 1    Number of Visits 6    Date for Recertification  03/07/24    Authorization Type MCD    PT Start Time 0755    PT Stop Time 0830    PT Time Calculation (min) 35 min    Activity Tolerance Patient tolerated treatment well;Patient limited by pain          Past Medical History:  Diagnosis Date   nscl ca dx'd 06/2019   Pneumonia 05/08/2019   Past Surgical History:  Procedure Laterality Date   BIOPSY  07/03/2019   Procedure: BIOPSY;  Surgeon: Jude Harden GAILS, MD;  Location: [facility] ENDOSCOPY;  Service: Cardiopulmonary;;   BRONCHIAL BRUSHINGS  07/03/2019   Procedure: BRONCHIAL BRUSHINGS;  Surgeon: Jude Harden GAILS, MD;  Location: [facility] ENDOSCOPY;  Service: Cardiopulmonary;;   BRONCHIAL WASHINGS  07/03/2019   Procedure: BRONCHIAL WASHINGS;  Surgeon: Jude Harden GAILS, MD;  Location: [facility] ENDOSCOPY;  Service: Cardiopulmonary;;   TOOTH EXTRACTION     VIDEO BRONCHOSCOPY N/A 07/03/2019   Procedure: VIDEO BRONCHOSCOPY WITH FLUORO;  Surgeon: Jude Harden GAILS, MD;  Location: [facility] ENDOSCOPY;  Service: Cardiopulmonary;  Laterality: N/A;  LMA   Patient Active Problem List   Diagnosis Date Noted   Binge eating 10/03/2022   Elevated systolic blood pressure reading without diagnosis of hypertension 10/03/2022   Hyperlipidemia LDL goal <100 06/04/2022   Mood swings 01/09/2022   Stress headaches 01/09/2022   Iron deficiency anemia 06/04/2021   Class 3 severe obesity due to excess calories with serious comorbidity and body mass index (BMI) of 40.0 to 44.9 in adult 06/04/2021   Other fatigue 05/11/2021   Acquired hypothyroidism 03/18/2020   Vitamin D  deficiency 10/06/2019   Arthralgia 10/05/2019   History of Graves' disease 09/09/2019   Encounter for antineoplastic chemotherapy  07/28/2019   Primary adenocarcinoma of right lung (HCC) 07/14/2019   Pulmonary infiltrates    GAD (generalized anxiety disorder) 05/19/2019   Depression, major, single episode, moderate (HCC) 05/08/2019    PCP:  Gayle Saddie JULIANNA DEVONNA   REFERRING PROVIDER: Vernetta Lonni GRADE, MD  REFERRING DIAG: [id number] (ICD-10-CM) - Chronic pain of right knee  THERAPY DIAG:  Chronic pain of right knee  Muscle weakness (generalized)  Chondromalacia of knee, right  Rationale for Evaluation and Treatment: Rehabilitation  ONSET DATE: chronic  SUBJECTIVE:   SUBJECTIVE STATEMENT: The patient is a 38 year old female who comes in today with right knee pain has been hurting for some time now with some swelling and pain when standing too long as well as popping and cracking. She did have a remote history of that knee at 1 point but has never had surgery or injections in the knee. She has not had any type of therapy either. She does state that the right knee has some swelling when compared to left knee. I was able to review her past medical history and medications within epic. One of her comorbidities is morbid obesity with a BMI of 46.06.  PERTINENT HISTORY: On examination her right knee does have some slight swelling when comparing the right and left knees.  Both knees slightly hyperextend and there is definitely some patellofemoral crepitation.  The right knee feels ligamentously stable but I do believe both patellas track slightly laterally.   An  x-ray standing shows both knees on the AP view and the right knee on the lateral view.  There is no acute findings.  I do believe the patellas track slightly laterally.   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  I did offer a steroid injection of the right  knee today but she has deferred this appropriately 1 to see what therapy does for her.  We will work on setting her up for outpatient physical therapy for her knees and then we will see her back in 6 weeks for follow-up. PAIN:  Are you having pain? Yes: NPRS scale: 3-4/10 Pain location: R knee Pain description: ache, sore Aggravating factors: direction changes, shaking leg, excessive heel strike Relieving factors: position changes  PRECAUTIONS: None  RED FLAGS: None   WEIGHT BEARING RESTRICTIONS: No  FALLS:  Has patient fallen in last 6 months? No  OCCUPATION: not working  PLOF: Independent  PATIENT GOALS: To manage my knee pain  NEXT MD VISIT: 6 weeks  OBJECTIVE:  Note: Objective measures were completed at Evaluation unless otherwise noted.  DIAGNOSTIC FINDINGS: An x-ray standing shows both knees on the AP view and the right knee on the lateral view. There is no acute findings. I do believe the patellas track slightly laterally.   PATIENT SURVEYS:  LEFS   MUSCLE LENGTH: Hamstrings: Right 80 deg; Left 80 deg   POSTURE: No Significant postural limitations  PALPATION: unremarkable  LOWER EXTREMITY ROM:  Active ROM Right eval Left eval  Hip flexion    Hip extension    Hip abduction    Hip adduction    Hip internal rotation    Hip external rotation    Knee flexion WNL   Knee extension -10d   Ankle dorsiflexion    Ankle plantarflexion    Ankle inversion    Ankle eversion     (Blank rows = not tested)  LOWER EXTREMITY MMT:  MMT Right eval Left eval  Hip flexion    Hip extension    Hip abduction    Hip adduction    Hip internal rotation    Hip external rotation    Knee flexion    Knee extension 4-   Ankle dorsiflexion    Ankle plantarflexion    Ankle inversion    Ankle eversion     (Blank rows = not tested)  LOWER EXTREMITY SPECIAL TESTS:  Knee special tests: Patellafemoral apprehension test: negative, Lateral pull sign: positive ,  Patellafemoral grind test: positive , and Patella tap test (ballotable patella): negative  FUNCTIONAL TESTS:  30 seconds chair stand test   GAIT: Distance walked: 25ft x2 Assistive device utilized: None Level of assistance: Complete Independence Comments: unremarkable  TREATMENT:  [facility] Adult PT Treatment:                                                DATE: 01/06/24  Self Care: Additional minutes spent for educating on updated Therapeutic Home Exercise Program as well as comparing current status to condition at start of symptoms. This included exercises focusing on stretching, strengthening, with focus on eccentric aspects. Long term goals include an improvement in range of motion, strength, endurance as well as avoiding reinjury. Patient's frequency would include in 1-2 times a day, 3-5 times a week for a duration of 6-12 weeks. Proper technique shown and discussed handout in great detail. All questions were discussed and addressed.      PATIENT EDUCATION:  Education details: Discussed eval findings, rehab rationale and POC and patient is in agreement  Person educated: Patient Education method: Explanation and Handouts Education comprehension: verbalized understanding and needs further education  HOME EXERCISE PROGRAM: Access Code: [email or web address] URL: [email or web address] Date: 01/06/2024 Prepared by: Reyes Kohut  Exercises - Supine Quad Set  - 2 x daily - 5 x weekly - 2 sets - 15 reps - 3s hold - Supine Knee Extension Strengthening  - 2 x daily - 5 x weekly - 2 sets - 15 reps - 3s hold - Small Range Straight Leg Raise  - 2 x daily - 5 x weekly - 2 sets - 15 reps - Heel Toe Raises with Counter Support  - 2 x daily - 5 x weekly - 2 sets - 15 reps  ASSESSMENT:  CLINICAL IMPRESSION: Patient is a 38 y.o. female who was seen today for physical  therapy evaluation and treatment for chronic R knee pain from suspected chondromalacia. Patient presents with knee effusion and lack of TKE.   Positive lateral tracking with knee extension.  Crepitus noted with AROM knee extension.  5s STS time functional.  Patient would benefit from OPPT to regain full knee extension and resolve lateral pateral tracking issues.  OBJECTIVE IMPAIRMENTS: decreased activity tolerance, decreased knowledge of condition, decreased mobility, decreased ROM, decreased strength, obesity, and pain.   ACTIVITY LIMITATIONS: sitting, standing, squatting, and stairs  PERSONAL FACTORS: Age, Fitness, and Time since onset of injury/illness/exacerbation are also affecting patient's functional outcome.   REHAB POTENTIAL: Fair due to chronicity and body habitus  CLINICAL DECISION MAKING: Stable/uncomplicated  EVALUATION COMPLEXITY: Low   GOALS: Goals reviewed with patient? No  SHORT TERM GOALS =LONG TERM GOALS: Target date: 03/02/2024    Patient to demonstrate independence in HEP  Baseline: [id number] Goal status: INITIAL  2.  0d R knee extension Baseline: -10d Goal status: INITIAL  3.  4/5 R knee extension strength  Baseline: 4-/5 Goal status: INITIAL  4.  Patient will score at least 65/80 on LEFS to signify clinically meaningful improvement in functional abilities.   Baseline: 55/80 Goal status: INITIAL  5.  Improve patellar tracking by increasing VMO function Baseline: excess lateral tracking due to VMO insufficiency  Goal status: INITIAL     PLAN:  PT FREQUENCY: 1x/week  PT DURATION: 6 weeks  PLANNED INTERVENTIONS: 97110-Therapeutic exercises, 97530- Therapeutic activity, 97112- Neuromuscular re-education, 97535- Self Care, [id number]- Manual therapy, Patient/Family education, Balance training, and Stair training  PLAN FOR NEXT SESSION: HEP review and update, manual techniques as appropriate, aerobic tasks, ROM and flexibility activities, strengthening  and PREs, TPDN, gait and  balance training as needed    For all possible CPT codes, reference the Planned Interventions line above.     Check all conditions that are expected to impact treatment: {Conditions expected to impact treatment:Morbid obesity and Musculoskeletal disorders   If treatment provided at initial evaluation, no treatment charged due to lack of authorization.       Joya Willmott M Kathye Cipriani, PT 01/06/2024, 8:43 AM

## 2024-01-06 ENCOUNTER — Ambulatory Visit: Attending: Orthopaedic Surgery

## 2024-01-06 ENCOUNTER — Other Ambulatory Visit: Payer: Self-pay

## 2024-01-06 DIAGNOSIS — M6281 Muscle weakness (generalized): Secondary | ICD-10-CM | POA: Insufficient documentation

## 2024-01-06 DIAGNOSIS — M25561 Pain in right knee: Secondary | ICD-10-CM | POA: Diagnosis present

## 2024-01-06 DIAGNOSIS — M94261 Chondromalacia, right knee: Secondary | ICD-10-CM | POA: Insufficient documentation

## 2024-01-06 DIAGNOSIS — G8929 Other chronic pain: Secondary | ICD-10-CM | POA: Insufficient documentation

## 2024-01-07 ENCOUNTER — Ambulatory Visit

## 2024-01-15 NOTE — Therapy (Addendum)
 OUTPATIENT PHYSICAL THERAPY LOWER EXTREMITY TREATMENT/DC   Patient Name: Sarah Chandler MRN: [id number] DOB:1985-08-24, 38 y.o., female Today's Date: 01/16/2024  END OF SESSION:  PT End of Session - 01/16/24 1044     Visit Number 2    Number of Visits 6    Date for Recertification  03/07/24    Authorization Type MCD    PT Start Time 1021    PT Stop Time 1101    PT Time Calculation (min) 40 min    Activity Tolerance Patient tolerated treatment well;Patient limited by pain           Past Medical History:  Diagnosis Date   nscl ca dx'd 06/2019   Pneumonia 05/08/2019   Past Surgical History:  Procedure Laterality Date   BIOPSY  07/03/2019   Procedure: BIOPSY;  Surgeon: Jude Harden GAILS, MD;  Location: [facility] ENDOSCOPY;  Service: Cardiopulmonary;;   BRONCHIAL BRUSHINGS  07/03/2019   Procedure: BRONCHIAL BRUSHINGS;  Surgeon: Jude Harden GAILS, MD;  Location: [facility] ENDOSCOPY;  Service: Cardiopulmonary;;   BRONCHIAL WASHINGS  07/03/2019   Procedure: BRONCHIAL WASHINGS;  Surgeon: Jude Harden GAILS, MD;  Location: [facility] ENDOSCOPY;  Service: Cardiopulmonary;;   TOOTH EXTRACTION     VIDEO BRONCHOSCOPY N/A 07/03/2019   Procedure: VIDEO BRONCHOSCOPY WITH FLUORO;  Surgeon: Jude Harden GAILS, MD;  Location: [facility] ENDOSCOPY;  Service: Cardiopulmonary;  Laterality: N/A;  LMA   Patient Active Problem List   Diagnosis Date Noted   Binge eating 10/03/2022   Elevated systolic blood pressure reading without diagnosis of hypertension 10/03/2022   Hyperlipidemia LDL goal <100 06/04/2022   Mood swings 01/09/2022   Stress headaches 01/09/2022   Iron deficiency anemia 06/04/2021   Class 3 severe obesity due to excess calories with serious comorbidity and body mass index (BMI) of 40.0 to 44.9 in adult [facility]) 06/04/2021   Other fatigue 05/11/2021   Acquired hypothyroidism 03/18/2020   Vitamin D  deficiency 10/06/2019   Arthralgia 10/05/2019   History of Graves' disease 09/09/2019   Encounter for antineoplastic  chemotherapy 07/28/2019   Primary adenocarcinoma of right lung (HCC) 07/14/2019   Pulmonary infiltrates    GAD (generalized anxiety disorder) 05/19/2019   Depression, major, single episode, moderate (HCC) 05/08/2019    PCP:  Gayle Saddie FALCON, PA-C   REFERRING PROVIDER: Vernetta Lonni GRADE, MD  REFERRING DIAG: [phone] (ICD-10-CM) - Chronic pain of right knee  THERAPY DIAG:  Chronic pain of right knee  Muscle weakness (generalized)  Chondromalacia of knee, right  Rationale for Evaluation and Treatment: Rehabilitation  ONSET DATE: chronic  SUBJECTIVE:   SUBJECTIVE STATEMENT: Pt reports her R knee pain is better and she has been consistent with completion of her HEP. No pain today, but will increase with activity.  EVAL: The patient is a 38 year old female who comes in today with right knee pain has been hurting for some time now with some swelling and pain when standing too long as well as popping and cracking. She did have a remote history of that knee at 1 point but has never had surgery or injections in the knee. She has not had any type of therapy either. She does state that the right knee has some swelling when compared to left knee. I was able to review her past medical history and medications within epic. One of her comorbidities is morbid obesity with a BMI of 46.06.  PERTINENT HISTORY: On examination her right knee does have some slight swelling when comparing the right and left knees.  Both knees slightly hyperextend and there is definitely some patellofemoral crepitation.  The right knee feels ligamentously stable but I do believe both patellas track slightly laterally.   An x-ray standing shows both knees on the AP view and the right knee on the lateral view.  There is no acute findings.  I do believe the patellas track slightly laterally.   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  I did offer a steroid injection of the right knee today but she has deferred this appropriately 1 to see what therapy does for her.  We will work on setting her up for outpatient physical therapy for her knees and then we will see her back in 6 weeks for follow-up. PAIN:  Are you having pain? Yes: NPRS scale: 0/10 Pain location: R knee Pain description: ache, sore Aggravating factors: direction changes, shaking leg, excessive heel strike Relieving factors: position changes  PRECAUTIONS: None  RED FLAGS: None   WEIGHT BEARING RESTRICTIONS: No  FALLS:  Has patient fallen in last 6 months? No  OCCUPATION: not working  PLOF: Independent  PATIENT GOALS: To manage my knee pain  NEXT MD VISIT: 6 weeks  OBJECTIVE:  Note: Objective measures were completed at Evaluation unless otherwise noted.  DIAGNOSTIC FINDINGS: An x-ray standing shows both knees on the AP view and the right knee on the lateral view. There is no acute findings. I do believe the patellas track slightly laterally.   PATIENT SURVEYS:  LEFS   MUSCLE LENGTH: Hamstrings: Right 80 deg; Left 80 deg   POSTURE: No Significant postural limitations  PALPATION: unremarkable  LOWER EXTREMITY ROM:  Active ROM Right eval Left eval  Hip flexion    Hip extension    Hip abduction    Hip adduction    Hip internal rotation    Hip external rotation    Knee flexion WNL   Knee extension -10d   Ankle dorsiflexion    Ankle plantarflexion    Ankle inversion    Ankle eversion     (Blank rows = not tested)  LOWER EXTREMITY MMT:  MMT Right eval Left eval  Hip flexion    Hip extension    Hip abduction    Hip adduction    Hip internal rotation    Hip external rotation    Knee flexion    Knee extension 4-   Ankle dorsiflexion    Ankle plantarflexion    Ankle inversion    Ankle eversion      (Blank rows = not tested)  LOWER EXTREMITY SPECIAL TESTS:  Knee special tests: Patellafemoral apprehension test: negative, Lateral pull sign: positive , Patellafemoral grind test: positive , and Patella tap test (ballotable patella): negative  FUNCTIONAL TESTS:  30 seconds chair stand test   GAIT: Distance walked: 81ft x2 Assistive device utilized: None Level of assistance: Complete Independence Comments: unremarkable  TREATMENT:  [facility] Adult PT Treatment:                                                DATE: 01/16/24 Therapeutic Exercise: Supine Quad Set 2x10 3 Supine SAQ 2x10 3 Small Range Straight Leg Raise 2x10 3 Bridge c add ball squeeze 2x10 3 Heel Toe Raises with Counter Support 2x 10 Therapeutic Activity: Single leg standing x5 30 L hip abd on airex 2x10 Standing TKE c ball on wall 2x10  [facility] Adult PT Treatment:                                                DATE: 01/06/24  Self Care: Additional minutes spent for educating on updated Therapeutic Home Exercise Program as well as comparing current status to condition at start of symptoms. This included exercises focusing on stretching, strengthening, with focus on eccentric aspects. Long term goals include an improvement in range of motion, strength, endurance as well as avoiding reinjury. Patient's frequency would include in 1-2 times a day, 3-5 times a week for a duration of 6-12 weeks. Proper technique shown and discussed handout in great detail. All questions were discussed and addressed.      PATIENT EDUCATION:  Education details: Discussed eval findings, rehab rationale and POC and patient is in agreement  Person educated: Patient Education method: Explanation and Handouts Education comprehension: verbalized understanding and needs further education  HOME EXERCISE PROGRAM: Access Code:  [email or web address] URL: [email or web address] Date: 01/16/2024 Prepared by: Dasie Daft  Exercises - Supine Quad Set  - 2 x daily - 5 x weekly - 2 sets - 15 reps - 3s hold - Supine Knee Extension Strengthening  - 2 x daily - 5 x weekly - 2 sets - 15 reps - 3s hold - Small Range Straight Leg Raise  - 2 x daily - 5 x weekly - 2 sets - 15 reps - Heel Toe Raises with Counter Support  - 2 x daily - 5 x weekly - 2 sets - 15 reps - Supine Bridge with Mini Swiss Ball Between Knees  - 1 x daily - 7 x weekly - 2 sets - 10-15 reps - 3 hold - Standing Single Leg Stance with Counter Support  - 1 x daily - 7 x weekly - 1 sets - 5 reps - 30 hold - Standing Hip Abduction with Counter Support  - 1 x daily - 7 x weekly - 2 sets - 10 reps - 3 hold - Terminal Knee Extension with Ball and Counter  - 1 x daily - 7 x weekly - 2 sets - 10 reps - 3 hold  ASSESSMENT:  CLINICAL IMPRESSION: PT was completed for R quad/LE. Exs were initiated in the [facility]. Her HEP was update. Pt reports being consistent with her initial HEP and has responded well with improved R knee pain. Pt tolerated prescribed exs in PT today without adverse effects . Pt will continue to benefit from skilled PT to address impairments for improved R knee/LE function with minimized pain.  EVAL: Patient is a 38 y.o. female who was seen today for physical therapy evaluation and treatment for chronic R knee pain from suspected chondromalacia. Patient presents with knee effusion and  lack of TKE.   Positive lateral tracking with knee extension.  Crepitus noted with AROM knee extension.  5s STS time functional.  Patient would benefit from OPPT to regain full knee extension and resolve lateral pateral tracking issues.  OBJECTIVE IMPAIRMENTS: decreased activity tolerance, decreased knowledge of condition, decreased mobility, decreased ROM, decreased strength, obesity, and pain.   ACTIVITY LIMITATIONS: sitting, standing, squatting, and stairs  PERSONAL  FACTORS: Age, Fitness, and Time since onset of injury/illness/exacerbation are also affecting patient's functional outcome.   REHAB POTENTIAL: Fair due to chronicity and body habitus  CLINICAL DECISION MAKING: Stable/uncomplicated  EVALUATION COMPLEXITY: Low   GOALS: Goals reviewed with patient? No  SHORT TERM GOALS =LONG TERM GOALS: Target date: 03/02/2024    Patient to demonstrate independence in HEP  Baseline: [id number] Goal status: INITIAL  2.  0d R knee extension Baseline: -10d Goal status: INITIAL  3.  4/5 R knee extension strength  Baseline: 4-/5 Goal status: INITIAL  4.  Patient will score at least 65/80 on LEFS to signify clinically meaningful improvement in functional abilities.   Baseline: 55/80 Goal status: INITIAL  5.  Improve patellar tracking by increasing VMO function Baseline: excess lateral tracking due to VMO insufficiency  Goal status: INITIAL     PLAN:  PT FREQUENCY: 1x/week  PT DURATION: 6 weeks  PLANNED INTERVENTIONS: 97110-Therapeutic exercises, 97530- Therapeutic activity, 97112- Neuromuscular re-education, 97535- Self Care, [id number]- Manual therapy, Patient/Family education, Balance training, and Stair training  PLAN FOR NEXT SESSION: HEP review and update, manual techniques as appropriate, aerobic tasks, ROM and flexibility activities, strengthening and PREs, TPDN, gait and balance training as needed    For all possible CPT codes, reference the Planned Interventions line above.     Check all conditions that are expected to impact treatment: {Conditions expected to impact treatment:Morbid obesity and Musculoskeletal disorders   If treatment provided at initial evaluation, no treatment charged due to lack of authorization.      Patches Mcdonnell MS, PT 01/16/24 1:21 PM   PHYSICAL THERAPY DISCHARGE SUMMARY  Visits from Start of Care: 2  Current functional level related to goals / functional outcomes: See clinical impression and PT goals     Remaining deficits: See clinical impression and PT goals   Education / Equipment: HEP   Patient agrees to discharge. Patient goals were Pt called to cancel remaining PT appt s with her R knee pain being significantly improved. Patient is being discharged due to Pt calling to cancel remaining PT appt s with her R knee pain being significantly improved   Dasie Daft MS, PT 02/11/24 2:31 PM

## 2024-01-16 ENCOUNTER — Ambulatory Visit: Attending: Orthopaedic Surgery

## 2024-01-16 DIAGNOSIS — M25561 Pain in right knee: Secondary | ICD-10-CM | POA: Insufficient documentation

## 2024-01-16 DIAGNOSIS — M6281 Muscle weakness (generalized): Secondary | ICD-10-CM | POA: Diagnosis present

## 2024-01-16 DIAGNOSIS — M94261 Chondromalacia, right knee: Secondary | ICD-10-CM | POA: Diagnosis present

## 2024-01-16 DIAGNOSIS — G8929 Other chronic pain: Secondary | ICD-10-CM | POA: Insufficient documentation

## 2024-01-21 ENCOUNTER — Other Ambulatory Visit: Payer: Self-pay

## 2024-01-21 NOTE — Therapy (Incomplete)
 OUTPATIENT PHYSICAL THERAPY LOWER EXTREMITY TREATMENT   Patient Name: Sarah Chandler MRN: [id number] DOB:Sep 06, 1985, 38 y.o., female Today's Date: 01/21/2024  END OF SESSION:     Past Medical History:  Diagnosis Date   nscl ca dx'd 06/2019   Pneumonia 05/08/2019   Past Surgical History:  Procedure Laterality Date   BIOPSY  07/03/2019   Procedure: BIOPSY;  Surgeon: Jude Harden GAILS, MD;  Location: [facility] ENDOSCOPY;  Service: Cardiopulmonary;;   BRONCHIAL BRUSHINGS  07/03/2019   Procedure: BRONCHIAL BRUSHINGS;  Surgeon: Jude Harden GAILS, MD;  Location: [facility] ENDOSCOPY;  Service: Cardiopulmonary;;   BRONCHIAL WASHINGS  07/03/2019   Procedure: BRONCHIAL WASHINGS;  Surgeon: Jude Harden GAILS, MD;  Location: [facility] ENDOSCOPY;  Service: Cardiopulmonary;;   TOOTH EXTRACTION     VIDEO BRONCHOSCOPY N/A 07/03/2019   Procedure: VIDEO BRONCHOSCOPY WITH FLUORO;  Surgeon: Jude Harden GAILS, MD;  Location: [facility] ENDOSCOPY;  Service: Cardiopulmonary;  Laterality: N/A;  LMA   Patient Active Problem List   Diagnosis Date Noted   Binge eating 10/03/2022   Elevated systolic blood pressure reading without diagnosis of hypertension 10/03/2022   Hyperlipidemia LDL goal <100 06/04/2022   Mood swings 01/09/2022   Stress headaches 01/09/2022   Iron deficiency anemia 06/04/2021   Class 3 severe obesity due to excess calories with serious comorbidity and body mass index (BMI) of 40.0 to 44.9 in adult [facility]) 06/04/2021   Other fatigue 05/11/2021   Acquired hypothyroidism 03/18/2020   Vitamin D  deficiency 10/06/2019   Arthralgia 10/05/2019   History of Graves' disease 09/09/2019   Encounter for antineoplastic chemotherapy 07/28/2019   Primary adenocarcinoma of right lung (HCC) 07/14/2019   Pulmonary infiltrates    GAD (generalized anxiety disorder) 05/19/2019   Depression, major, single episode, moderate (HCC) 05/08/2019    PCP:  Gayle Saddie FALCON, PA-C   REFERRING PROVIDER: Vernetta Lonni GRADE, MD  REFERRING DIAG:  [phone] (ICD-10-CM) - Chronic pain of right knee  THERAPY DIAG:  No diagnosis found.  Rationale for Evaluation and Treatment: Rehabilitation  ONSET DATE: chronic  SUBJECTIVE:   SUBJECTIVE STATEMENT: Pt reports her R knee pain is better and she has been consistent with completion of her HEP. No pain today, but will increase with activity.  EVAL: The patient is a 38 year old female who comes in today with right knee pain has been hurting for some time now with some swelling and pain when standing too long as well as popping and cracking. She did have a remote history of that knee at 1 point but has never had surgery or injections in the knee. She has not had any type of therapy either. She does state that the right knee has some swelling when compared to left knee. I was able to review her past medical history and medications within epic. One of her comorbidities is morbid obesity with a BMI of 46.06.  PERTINENT HISTORY: On examination her right knee does have some slight swelling when comparing the right and left knees.  Both knees slightly hyperextend and there is definitely some patellofemoral crepitation.  The right knee feels ligamentously stable but I do believe both patellas track slightly laterally.   An x-ray standing shows both knees on the AP view and the right knee on the lateral view.  There is no acute findings.  I do believe the patellas track slightly laterally.   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  I did offer a steroid injection of the right knee today but she has deferred this appropriately 1 to see what therapy does for her.  We will work on setting her up for outpatient physical therapy for her knees and then we will see her back in 6 weeks for follow-up. PAIN:  Are  you having pain? Yes: NPRS scale: 0/10 Pain location: R knee Pain description: ache, sore Aggravating factors: direction changes, shaking leg, excessive heel strike Relieving factors: position changes  PRECAUTIONS: None  RED FLAGS: None   WEIGHT BEARING RESTRICTIONS: No  FALLS:  Has patient fallen in last 6 months? No  OCCUPATION: not working  PLOF: Independent  PATIENT GOALS: To manage my knee pain  NEXT MD VISIT: 6 weeks  OBJECTIVE:  Note: Objective measures were completed at Evaluation unless otherwise noted.  DIAGNOSTIC FINDINGS: An x-ray standing shows both knees on the AP view and the right knee on the lateral view. There is no acute findings. I do believe the patellas track slightly laterally.   PATIENT SURVEYS:  LEFS   MUSCLE LENGTH: Hamstrings: Right 80 deg; Left 80 deg   POSTURE: No Significant postural limitations  PALPATION: unremarkable  LOWER EXTREMITY ROM:  Active ROM Right eval Left eval  Hip flexion    Hip extension    Hip abduction    Hip adduction    Hip internal rotation    Hip external rotation    Knee flexion WNL   Knee extension -10d   Ankle dorsiflexion    Ankle plantarflexion    Ankle inversion    Ankle eversion     (Blank rows = not tested)  LOWER EXTREMITY MMT:  MMT Right eval Left eval  Hip flexion    Hip extension    Hip abduction    Hip adduction    Hip internal rotation    Hip external rotation    Knee flexion    Knee extension 4-   Ankle dorsiflexion    Ankle plantarflexion    Ankle inversion    Ankle eversion     (Blank rows = not tested)  LOWER EXTREMITY SPECIAL TESTS:  Knee special tests: Patellafemoral apprehension test: negative, Lateral pull sign: positive , Patellafemoral grind test: positive , and Patella tap test (ballotable patella): negative  FUNCTIONAL TESTS:  30 seconds chair stand test   GAIT: Distance walked: 30ft x2 Assistive device utilized: None Level of assistance: Complete  Independence Comments: unremarkable                                                                                                                                TREATMENT:  [facility] Adult PT Treatment:  DATE: 01/22/24 Therapeutic Exercise: Supine Quad Set 2x10 3 Supine SAQ 2x10 3 Small Range Straight Leg Raise 2x10 3 Bridge c add ball squeeze 2x10 3 Heel Toe Raises with Counter Support 2x 10 Therapeutic Activity: Single leg standing x5 30 L hip abd on airex 2x10 Standing TKE c ball on wall 2x10 Therapeutic Exercise: *** Manual Therapy: *** Neuromuscular re-ed: *** Therapeutic Activity: *** Modalities: *** Self Care: ***  [facility] Adult PT Treatment:                                                DATE: 01/16/24 Therapeutic Exercise: Supine Quad Set 2x10 3 Supine SAQ 2x10 3 Small Range Straight Leg Raise 2x10 3 Bridge c add ball squeeze 2x10 3 Heel Toe Raises with Counter Support 2x 10 Therapeutic Activity: Single leg standing x5 30 L hip abd on airex 2x10 Standing TKE c ball on wall 2x10  [facility] Adult PT Treatment:                                                DATE: 01/06/24  Self Care: Additional minutes spent for educating on updated Therapeutic Home Exercise Program as well as comparing current status to condition at start of symptoms. This included exercises focusing on stretching, strengthening, with focus on eccentric aspects. Long term goals include an improvement in range of motion, strength, endurance as well as avoiding reinjury. Patient's frequency would include in 1-2 times a day, 3-5 times a week for a duration of 6-12 weeks. Proper technique shown and discussed handout in great detail. All questions were discussed and addressed.      PATIENT EDUCATION:  Education details: Discussed eval findings, rehab rationale and POC and patient is in agreement  Person educated: Patient Education method: Explanation and  Handouts Education comprehension: verbalized understanding and needs further education  HOME EXERCISE PROGRAM: Access Code: [email or web address] URL: [email or web address] Date: 01/16/2024 Prepared by: Dasie Daft  Exercises - Supine Quad Set  - 2 x daily - 5 x weekly - 2 sets - 15 reps - 3s hold - Supine Knee Extension Strengthening  - 2 x daily - 5 x weekly - 2 sets - 15 reps - 3s hold - Small Range Straight Leg Raise  - 2 x daily - 5 x weekly - 2 sets - 15 reps - Heel Toe Raises with Counter Support  - 2 x daily - 5 x weekly - 2 sets - 15 reps - Supine Bridge with Mini Swiss Ball Between Knees  - 1 x daily - 7 x weekly - 2 sets - 10-15 reps - 3 hold - Standing Single Leg Stance with Counter Support  - 1 x daily - 7 x weekly - 1 sets - 5 reps - 30 hold - Standing Hip Abduction with Counter Support  - 1 x daily - 7 x weekly - 2 sets - 10 reps - 3 hold - Terminal Knee Extension with Ball and Counter  - 1 x daily - 7 x weekly - 2 sets - 10 reps - 3 hold  ASSESSMENT:  CLINICAL IMPRESSION: PT was completed for R quad/LE. Exs were initiated in the [facility]. Her HEP was update. Pt reports being consistent with her initial HEP  and has responded well with improved R knee pain. Pt tolerated prescribed exs in PT today without adverse effects . Pt will continue to benefit from skilled PT to address impairments for improved R knee/LE function with minimized pain.  EVAL: Patient is a 38 y.o. female who was seen today for physical therapy evaluation and treatment for chronic R knee pain from suspected chondromalacia. Patient presents with knee effusion and lack of TKE.   Positive lateral tracking with knee extension.  Crepitus noted with AROM knee extension.  5s STS time functional.  Patient would benefit from OPPT to regain full knee extension and resolve lateral pateral tracking issues.  OBJECTIVE IMPAIRMENTS: decreased activity tolerance, decreased knowledge of condition, decreased mobility, decreased  ROM, decreased strength, obesity, and pain.   ACTIVITY LIMITATIONS: sitting, standing, squatting, and stairs  PERSONAL FACTORS: Age, Fitness, and Time since onset of injury/illness/exacerbation are also affecting patient's functional outcome.   REHAB POTENTIAL: Fair due to chronicity and body habitus  CLINICAL DECISION MAKING: Stable/uncomplicated  EVALUATION COMPLEXITY: Low   GOALS: Goals reviewed with patient? No  SHORT TERM GOALS =LONG TERM GOALS: Target date: 03/02/2024    Patient to demonstrate independence in HEP  Baseline: [id number] Goal status: INITIAL  2.  0d R knee extension Baseline: -10d Goal status: INITIAL  3.  4/5 R knee extension strength  Baseline: 4-/5 Goal status: INITIAL  4.  Patient will score at least 65/80 on LEFS to signify clinically meaningful improvement in functional abilities.   Baseline: 55/80 Goal status: INITIAL  5.  Improve patellar tracking by increasing VMO function Baseline: excess lateral tracking due to VMO insufficiency  Goal status: INITIAL     PLAN:  PT FREQUENCY: 1x/week  PT DURATION: 6 weeks  PLANNED INTERVENTIONS: 97110-Therapeutic exercises, 97530- Therapeutic activity, 97112- Neuromuscular re-education, 97535- Self Care, [id number]- Manual therapy, Patient/Family education, Balance training, and Stair training  PLAN FOR NEXT SESSION: HEP review and update, manual techniques as appropriate, aerobic tasks, ROM and flexibility activities, strengthening and PREs, TPDN, gait and balance training as needed    For all possible CPT codes, reference the Planned Interventions line above.     Check all conditions that are expected to impact treatment: {Conditions expected to impact treatment:Morbid obesity and Musculoskeletal disorders   If treatment provided at initial evaluation, no treatment charged due to lack of authorization.      Jaeceon Michelin MS, PT 01/21/24 10:14 PM

## 2024-01-21 NOTE — Progress Notes (Signed)
 Clinical Intervention Note  Clinical Intervention Notes: Patient's sister called and asked if it would be ok for patient to take Cold Eeze. Per [facility], no interactions found with Cold Eeze and Lorbrena . Sister said patient was concerned about it containing zinc, I advised her to tell the patient that she can separate by 2 hours to be cautious, but again, no DDIs with Lorbrena .   Clinical Intervention Outcomes: Prevention of an adverse drug event   Advertising account planner

## 2024-01-22 ENCOUNTER — Encounter

## 2024-01-22 ENCOUNTER — Encounter (INDEPENDENT_AMBULATORY_CARE_PROVIDER_SITE_OTHER): Payer: Self-pay

## 2024-01-22 ENCOUNTER — Other Ambulatory Visit: Payer: Self-pay | Admitting: Physician Assistant

## 2024-01-22 ENCOUNTER — Other Ambulatory Visit: Payer: Self-pay

## 2024-01-22 ENCOUNTER — Other Ambulatory Visit (HOSPITAL_COMMUNITY): Payer: Self-pay

## 2024-01-22 DIAGNOSIS — C3491 Malignant neoplasm of unspecified part of right bronchus or lung: Secondary | ICD-10-CM

## 2024-01-22 MED ORDER — LORLATINIB 100 MG PO TABS
100.0000 mg | ORAL_TABLET | Freq: Every day | ORAL | 2 refills | Status: DC
  Filled 2024-01-22 (×2): qty 30, 30d supply, fill #0
  Filled 2024-02-13 – 2024-02-14 (×3): qty 30, 30d supply, fill #1
  Filled 2024-03-13: qty 30, 30d supply, fill #2

## 2024-01-22 NOTE — Progress Notes (Signed)
 Specialty Pharmacy Refill Coordination Note  Sarah Chandler is a 38 y.o. female contacted today regarding refills of specialty medication(s) Lorlatinib  (LORBRENA )   Patient requested Delivery   Delivery date: 01/24/24   Verified address: [address], [place] [address]   Medication will be filled on 01/23/24.

## 2024-01-23 ENCOUNTER — Other Ambulatory Visit: Payer: Self-pay

## 2024-01-23 NOTE — Progress Notes (Signed)
 Clinical Intervention Note  Clinical Intervention Notes: Patient's sister reported that patient has started taking buspirone  and bupropion . Per Micromedex, Dellia can decrease the effectiveness of both buspiron and buproprion. Per chart, patient is allowed to increase her dose in 2 weeks if the current dose in ineffective and provider plans to follow up.   Clinical Intervention Outcomes: Prevention of an adverse drug event   Advertising account planner

## 2024-01-30 ENCOUNTER — Ambulatory Visit: Admitting: Physical Therapy

## 2024-02-03 ENCOUNTER — Ambulatory Visit (INDEPENDENT_AMBULATORY_CARE_PROVIDER_SITE_OTHER): Admitting: Orthopaedic Surgery

## 2024-02-03 ENCOUNTER — Encounter: Payer: Self-pay | Admitting: Orthopaedic Surgery

## 2024-02-03 DIAGNOSIS — M25561 Pain in right knee: Secondary | ICD-10-CM | POA: Diagnosis not present

## 2024-02-03 DIAGNOSIS — G8929 Other chronic pain: Secondary | ICD-10-CM

## 2024-02-03 NOTE — Progress Notes (Signed)
 The patient comes today for follow-up for her right knee pain.  She is 38 years old and she says that physical therapy has been very helpful with getting her knee stronger and feeling better overall with really no pain.  She does have 1 more physical therapy session this week on Wednesday but she feels like she can cancel that since she does have a home exercise program and they gave her a packet of information for working on strengthening her knees.  She is continuing on a weight loss journey as well given her morbid obesity.  Her patella slightly tracked laterally.  Exam today her right knee has excellent range of motion and no effusion and it feels better overall to her and to me.  From our standpoint follow-up can be as needed.  If she does develop any other issues with her knee she knows to hesitate to reach out and let us  know.

## 2024-02-05 ENCOUNTER — Ambulatory Visit

## 2024-02-05 NOTE — Therapy (Incomplete)
 OUTPATIENT PHYSICAL THERAPY LOWER EXTREMITY TREATMENT   Patient Name: Sarah Chandler MRN: [id number] DOB:04/15/1985, 39 y.o., female Today's Date: 02/05/2024  END OF SESSION:     Past Medical History:  Diagnosis Date   nscl ca dx'd 06/2019   Pneumonia 05/08/2019   Past Surgical History:  Procedure Laterality Date   BIOPSY  07/03/2019   Procedure: BIOPSY;  Surgeon: Jude Harden GAILS, MD;  Location: [facility] ENDOSCOPY;  Service: Cardiopulmonary;;   BRONCHIAL BRUSHINGS  07/03/2019   Procedure: BRONCHIAL BRUSHINGS;  Surgeon: Jude Harden GAILS, MD;  Location: [facility] ENDOSCOPY;  Service: Cardiopulmonary;;   BRONCHIAL WASHINGS  07/03/2019   Procedure: BRONCHIAL WASHINGS;  Surgeon: Jude Harden GAILS, MD;  Location: [facility] ENDOSCOPY;  Service: Cardiopulmonary;;   TOOTH EXTRACTION     VIDEO BRONCHOSCOPY N/A 07/03/2019   Procedure: VIDEO BRONCHOSCOPY WITH FLUORO;  Surgeon: Jude Harden GAILS, MD;  Location: [facility] ENDOSCOPY;  Service: Cardiopulmonary;  Laterality: N/A;  LMA   Patient Active Problem List   Diagnosis Date Noted   Binge eating 10/03/2022   Elevated systolic blood pressure reading without diagnosis of hypertension 10/03/2022   Hyperlipidemia LDL goal <100 06/04/2022   Mood swings 01/09/2022   Stress headaches 01/09/2022   Iron deficiency anemia 06/04/2021   Class 3 severe obesity due to excess calories with serious comorbidity and body mass index (BMI) of 40.0 to 44.9 in adult [facility]) 06/04/2021   Other fatigue 05/11/2021   Acquired hypothyroidism 03/18/2020   Vitamin D  deficiency 10/06/2019   Arthralgia 10/05/2019   History of Graves' disease 09/09/2019   Encounter for antineoplastic chemotherapy 07/28/2019   Primary adenocarcinoma of right lung (HCC) 07/14/2019   Pulmonary infiltrates    GAD (generalized anxiety disorder) 05/19/2019   Depression, major, single episode, moderate (HCC) 05/08/2019    PCP:  Gayle Saddie FALCON, PA-C   REFERRING PROVIDER: Vernetta Lonni GRADE, MD  REFERRING DIAG:  [phone] (ICD-10-CM) - Chronic pain of right knee  THERAPY DIAG:  No diagnosis found.  Rationale for Evaluation and Treatment: Rehabilitation  ONSET DATE: chronic  SUBJECTIVE:   SUBJECTIVE STATEMENT: Pt reports her R knee pain is better and she has been consistent with completion of her HEP. No pain today, but will increase with activity.  EVAL: The patient is a 38 year old female who comes in today with right knee pain has been hurting for some time now with some swelling and pain when standing too long as well as popping and cracking. She did have a remote history of that knee at 1 point but has never had surgery or injections in the knee. She has not had any type of therapy either. She does state that the right knee has some swelling when compared to left knee. I was able to review her past medical history and medications within epic. One of her comorbidities is morbid obesity with a BMI of 46.06.  PERTINENT HISTORY: On examination her right knee does have some slight swelling when comparing the right and left knees.  Both knees slightly hyperextend and there is definitely some patellofemoral crepitation.  The right knee feels ligamentously stable but I do believe both patellas track slightly laterally.   An x-ray standing shows both knees on the AP view and the right knee on the lateral view.  There is no acute findings.  I do believe the patellas track slightly laterally.   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  I did offer a steroid injection of the right knee today but she has deferred this appropriately 1 to see what therapy does for her.  We will work on setting her up for outpatient physical therapy for her knees and then we will see her back in 6 weeks for follow-up. PAIN:  Are  you having pain? Yes: NPRS scale: 0/10 Pain location: R knee Pain description: ache, sore Aggravating factors: direction changes, shaking leg, excessive heel strike Relieving factors: position changes  PRECAUTIONS: None  RED FLAGS: None   WEIGHT BEARING RESTRICTIONS: No  FALLS:  Has patient fallen in last 6 months? No  OCCUPATION: not working  PLOF: Independent  PATIENT GOALS: To manage my knee pain  NEXT MD VISIT: 6 weeks  OBJECTIVE:  Note: Objective measures were completed at Evaluation unless otherwise noted.  DIAGNOSTIC FINDINGS: An x-ray standing shows both knees on the AP view and the right knee on the lateral view. There is no acute findings. I do believe the patellas track slightly laterally.   PATIENT SURVEYS:  LEFS   MUSCLE LENGTH: Hamstrings: Right 80 deg; Left 80 deg   POSTURE: No Significant postural limitations  PALPATION: unremarkable  LOWER EXTREMITY ROM:  Active ROM Right eval Left eval  Hip flexion    Hip extension    Hip abduction    Hip adduction    Hip internal rotation    Hip external rotation    Knee flexion WNL   Knee extension -10d   Ankle dorsiflexion    Ankle plantarflexion    Ankle inversion    Ankle eversion     (Blank rows = not tested)  LOWER EXTREMITY MMT:  MMT Right eval Left eval  Hip flexion    Hip extension    Hip abduction    Hip adduction    Hip internal rotation    Hip external rotation    Knee flexion    Knee extension 4-   Ankle dorsiflexion    Ankle plantarflexion    Ankle inversion    Ankle eversion     (Blank rows = not tested)  LOWER EXTREMITY SPECIAL TESTS:  Knee special tests: Patellafemoral apprehension test: negative, Lateral pull sign: positive , Patellafemoral grind test: positive , and Patella tap test (ballotable patella): negative  FUNCTIONAL TESTS:  30 seconds chair stand test   GAIT: Distance walked: 69ft x2 Assistive device utilized: None Level of assistance: Complete  Independence Comments: unremarkable                                                                                                                                TREATMENT:  [facility] Adult PT Treatment:  DATE: 02/05/24 Therapeutic Exercise: Supine Quad Set 2x10 3 Supine SAQ 2x10 3 Small Range Straight Leg Raise 2x10 3 Bridge c add ball squeeze 2x10 3 Heel Toe Raises with Counter Support 2x 10 Seated hamstring stretch 2x30 RLE Therapeutic Activity: Single leg standing x5 30 L hip abd on airex 2x10 Standing TKE c ball on wall 2x10  [facility] Adult PT Treatment:                                                DATE: 01/22/24 Therapeutic Exercise: Supine Quad Set 2x10 3 Supine SAQ 2x10 3 Small Range Straight Leg Raise 2x10 3 Bridge c add ball squeeze 2x10 3 Heel Toe Raises with Counter Support 2x 10 Therapeutic Activity: Single leg standing x5 30 L hip abd on airex 2x10 Standing TKE c ball on wall 2x10  [facility] Adult PT Treatment:                                                DATE: 01/16/24 Therapeutic Exercise: Supine Quad Set 2x10 3 Supine SAQ 2x10 3 Small Range Straight Leg Raise 2x10 3 Bridge c add ball squeeze 2x10 3 Heel Toe Raises with Counter Support 2x 10 Therapeutic Activity: Single leg standing x5 30 L hip abd on airex 2x10 Standing TKE c ball on wall 2x10   PATIENT EDUCATION:  Education details: Discussed eval findings, rehab rationale and POC and patient is in agreement  Person educated: Patient Education method: Explanation and Handouts Education comprehension: verbalized understanding and needs further education  HOME EXERCISE PROGRAM: Access Code: [email or web address] URL: [email or web address] Date: 01/16/2024 Prepared by: Dasie Daft  Exercises - Supine Quad Set  - 2 x daily - 5 x weekly - 2 sets - 15 reps - 3s hold - Supine Knee Extension Strengthening  - 2 x daily - 5 x weekly - 2 sets - 15  reps - 3s hold - Small Range Straight Leg Raise  - 2 x daily - 5 x weekly - 2 sets - 15 reps - Heel Toe Raises with Counter Support  - 2 x daily - 5 x weekly - 2 sets - 15 reps - Supine Bridge with Mini Swiss Ball Between Knees  - 1 x daily - 7 x weekly - 2 sets - 10-15 reps - 3 hold - Standing Single Leg Stance with Counter Support  - 1 x daily - 7 x weekly - 1 sets - 5 reps - 30 hold - Standing Hip Abduction with Counter Support  - 1 x daily - 7 x weekly - 2 sets - 10 reps - 3 hold - Terminal Knee Extension with Ball and Counter  - 1 x daily - 7 x weekly - 2 sets - 10 reps - 3 hold  ASSESSMENT:  CLINICAL IMPRESSION: ***  PT was completed for R quad/LE. Exs were initiated in the [facility]. Her HEP was update. Pt reports being consistent with her initial HEP and has responded well with improved R knee pain. Pt tolerated prescribed exs in PT today without adverse effects . Pt will continue to benefit from skilled PT to address impairments for improved R knee/LE function with minimized pain.  EVAL: Patient is a 38 y.o. female who was  seen today for physical therapy evaluation and treatment for chronic R knee pain from suspected chondromalacia. Patient presents with knee effusion and lack of TKE.   Positive lateral tracking with knee extension.  Crepitus noted with AROM knee extension.  5s STS time functional.  Patient would benefit from OPPT to regain full knee extension and resolve lateral pateral tracking issues.  OBJECTIVE IMPAIRMENTS: decreased activity tolerance, decreased knowledge of condition, decreased mobility, decreased ROM, decreased strength, obesity, and pain.   ACTIVITY LIMITATIONS: sitting, standing, squatting, and stairs  PERSONAL FACTORS: Age, Fitness, and Time since onset of injury/illness/exacerbation are also affecting patient's functional outcome.   REHAB POTENTIAL: Fair due to chronicity and body habitus  CLINICAL DECISION MAKING: Stable/uncomplicated  EVALUATION COMPLEXITY:  Low   GOALS: Goals reviewed with patient? No  SHORT TERM GOALS =LONG TERM GOALS: Target date: 03/02/2024    Patient to demonstrate independence in HEP  Baseline: [id number] Goal status: INITIAL  2.  0d R knee extension Baseline: -10d Goal status: INITIAL  3.  4/5 R knee extension strength  Baseline: 4-/5 Goal status: INITIAL  4.  Patient will score at least 65/80 on LEFS to signify clinically meaningful improvement in functional abilities.   Baseline: 55/80 Goal status: INITIAL  5.  Improve patellar tracking by increasing VMO function Baseline: excess lateral tracking due to VMO insufficiency  Goal status: INITIAL     PLAN:  PT FREQUENCY: 1x/week  PT DURATION: 6 weeks  PLANNED INTERVENTIONS: 97110-Therapeutic exercises, 97530- Therapeutic activity, 97112- Neuromuscular re-education, 97535- Self Care, 02859- Manual therapy, Patient/Family education, Balance training, and Stair training  PLAN FOR NEXT SESSION: HEP review and update, manual techniques as appropriate, aerobic tasks, ROM and flexibility activities, strengthening and PREs, TPDN, gait and balance training as needed    For all possible CPT codes, reference the Planned Interventions line above.     Check all conditions that are expected to impact treatment: {Conditions expected to impact treatment:Morbid obesity and Musculoskeletal disorders   If treatment provided at initial evaluation, no treatment charged due to lack of authorization.      Corean Pouch PTA  02/05/24 8:10 AM

## 2024-02-10 ENCOUNTER — Encounter: Payer: Self-pay | Admitting: Radiology

## 2024-02-13 ENCOUNTER — Other Ambulatory Visit (HOSPITAL_COMMUNITY): Payer: Self-pay

## 2024-02-13 ENCOUNTER — Other Ambulatory Visit: Payer: Self-pay | Admitting: Internal Medicine

## 2024-02-13 ENCOUNTER — Telehealth: Payer: Self-pay

## 2024-02-13 ENCOUNTER — Other Ambulatory Visit: Payer: Self-pay

## 2024-02-13 NOTE — Telephone Encounter (Signed)
 Spoke with McKayla from [facility] outpatient pharmacy in regards to patients refill of Atorvastatin .  Informed her that our office just refilled Rosuvastatin  to [facility] in [place], [place] today. She states that she will contact the patient to confirm correct medication.

## 2024-02-14 ENCOUNTER — Encounter (INDEPENDENT_AMBULATORY_CARE_PROVIDER_SITE_OTHER): Payer: Self-pay

## 2024-02-14 ENCOUNTER — Other Ambulatory Visit (HOSPITAL_COMMUNITY): Payer: Self-pay

## 2024-02-14 ENCOUNTER — Other Ambulatory Visit: Payer: Self-pay

## 2024-02-14 ENCOUNTER — Other Ambulatory Visit: Payer: Self-pay | Admitting: Pharmacy Technician

## 2024-02-14 NOTE — Progress Notes (Signed)
 Specialty Pharmacy Refill Coordination Note  Sarah Chandler is a 38 y.o. female contacted today regarding refills of specialty medication(s)  Lorlatinib  (LORBRENA )     Patient requested (Patient-Rptd) Delivery   Delivery date: 02/20/2024 Verified address: (Patient-Rptd) [address]  [place] [address]   Medication will be filled on: 02/19/2024

## 2024-02-18 ENCOUNTER — Other Ambulatory Visit: Payer: Self-pay

## 2024-03-04 ENCOUNTER — Other Ambulatory Visit (HOSPITAL_COMMUNITY): Payer: Self-pay

## 2024-03-13 ENCOUNTER — Other Ambulatory Visit (HOSPITAL_COMMUNITY): Payer: Self-pay

## 2024-03-16 ENCOUNTER — Other Ambulatory Visit: Payer: Self-pay

## 2024-03-16 NOTE — Progress Notes (Signed)
 Specialty Pharmacy Ongoing Clinical Assessment Note  Sarah Chandler is a 38 y.o. female who is being followed by the specialty pharmacy service for RxSp Oncology   Patient's specialty medication(s) reviewed today: Lorlatinib  (LORBRENA )   Missed doses in the last 4 weeks: 0   Patient/Caregiver did not have any additional questions or concerns.   Therapeutic benefit summary: Patient is achieving benefit   Adverse events/side effects summary: No adverse events/side effects   Patient's therapy is appropriate to: Continue    Goals Addressed             This Visit's Progress    Stabilization of disease   On track    Patient is on track. Patient will maintain adherence. Per visit on 12/17/23, CT of chest, abdomen and pelvis showed no concerning findings for disease progression.         Follow up: 6 months  [facility]

## 2024-03-16 NOTE — Progress Notes (Signed)
 Specialty Pharmacy Refill Coordination Note  Sarah Chandler is a 38 y.o. female contacted today regarding refills of specialty medication(s) Lorlatinib  (LORBRENA )   Patient requested Delivery   Delivery date: 03/20/24   Verified address: [address], [place] [address]   Medication will be filled on: 03/19/24

## 2024-03-17 ENCOUNTER — Inpatient Hospital Stay: Attending: Internal Medicine

## 2024-03-17 ENCOUNTER — Ambulatory Visit: Admitting: Internal Medicine

## 2024-03-17 VITALS — BP 153/93 | HR 89 | Temp 97.6°F | Resp 17 | Ht 70.0 in | Wt 317.0 lb

## 2024-03-17 DIAGNOSIS — Z79899 Other long term (current) drug therapy: Secondary | ICD-10-CM | POA: Diagnosis not present

## 2024-03-17 DIAGNOSIS — C3490 Malignant neoplasm of unspecified part of unspecified bronchus or lung: Secondary | ICD-10-CM | POA: Diagnosis not present

## 2024-03-17 DIAGNOSIS — C7931 Secondary malignant neoplasm of brain: Secondary | ICD-10-CM | POA: Insufficient documentation

## 2024-03-17 DIAGNOSIS — E7800 Pure hypercholesterolemia, unspecified: Secondary | ICD-10-CM | POA: Insufficient documentation

## 2024-03-17 DIAGNOSIS — C3431 Malignant neoplasm of lower lobe, right bronchus or lung: Secondary | ICD-10-CM | POA: Insufficient documentation

## 2024-03-17 DIAGNOSIS — C3491 Malignant neoplasm of unspecified part of right bronchus or lung: Secondary | ICD-10-CM

## 2024-03-17 LAB — CBC WITH DIFFERENTIAL (CANCER CENTER ONLY)
Abs Immature Granulocytes: 0.01 K/uL (ref 0.00–0.07)
Basophils Absolute: 0 K/uL (ref 0.0–0.1)
Basophils Relative: 0 %
Eosinophils Absolute: 0.2 K/uL (ref 0.0–0.5)
Eosinophils Relative: 3 %
HCT: 38.3 % (ref 36.0–46.0)
Hemoglobin: 12.8 g/dL (ref 12.0–15.0)
Immature Granulocytes: 0 %
Lymphocytes Relative: 32 %
Lymphs Abs: 2.3 K/uL (ref 0.7–4.0)
MCH: 28.6 pg (ref 26.0–34.0)
MCHC: 33.4 g/dL (ref 30.0–36.0)
MCV: 85.7 fL (ref 80.0–100.0)
Monocytes Absolute: 0.6 K/uL (ref 0.1–1.0)
Monocytes Relative: 9 %
Neutro Abs: 4.1 K/uL (ref 1.7–7.7)
Neutrophils Relative %: 56 %
Platelet Count: 218 K/uL (ref 150–400)
RBC: 4.47 MIL/uL (ref 3.87–5.11)
RDW: 13.4 % (ref 11.5–15.5)
WBC Count: 7.2 K/uL (ref 4.0–10.5)
nRBC: 0 % (ref 0.0–0.2)

## 2024-03-17 LAB — CMP (CANCER CENTER ONLY)
ALT: 28 U/L (ref 0–44)
AST: 22 U/L (ref 15–41)
Albumin: 4.3 g/dL (ref 3.5–5.0)
Alkaline Phosphatase: 64 U/L (ref 38–126)
Anion gap: 11 (ref 5–15)
BUN: 6 mg/dL (ref 6–20)
CO2: 24 mmol/L (ref 22–32)
Calcium: 9.3 mg/dL (ref 8.9–10.3)
Chloride: 105 mmol/L (ref 98–111)
Creatinine: 0.67 mg/dL (ref 0.44–1.00)
GFR, Estimated: >60 mL/min (ref >60)
Glucose, Bld: 98 mg/dL (ref 70–99)
Potassium: 3.9 mmol/L (ref 3.5–5.1)
Sodium: 140 mmol/L (ref 135–145)
Total Bilirubin: 0.3 mg/dL (ref 0.0–1.2)
Total Protein: 7.5 g/dL (ref 6.5–8.1)

## 2024-03-17 LAB — LIPID PANEL
Cholesterol: 229 mg/dL — ABNORMAL HIGH (ref 0–200)
HDL: 75 mg/dL (ref >40)
LDL Cholesterol: 122 mg/dL — ABNORMAL HIGH (ref 0–99)
Total CHOL/HDL Ratio: 3.1 ratio
Triglycerides: 162 mg/dL — ABNORMAL HIGH (ref <150)
VLDL: 32 mg/dL (ref 0–40)

## 2024-03-17 NOTE — Progress Notes (Signed)
 [facility] [phone]   [phone]  OFFICE PROGRESS NOTE  Sarah Saddie FALCON, PA-C [address] [name] [address]  DIAGNOSIS: Stage IV (T3, N2, M1c) non-small cell lung cancer, adenocarcinoma with ALK gene translocation presented with multiple bilateral pulmonary nodules and masses as well as small mediastinal and bilateral axillary lymphadenopathy and metastatic disease to the brain diagnosed in March 2021.  PRIOR THERAPY: Alecensa  (Alectinib) 600 mg p.o. twice daily.  First dose started on July 23 2019.  Status post 29 months of treatment.  CURRENT THERAPY: Lorlatinib  100 mg p.o. daily.  First dose was December 06, 2021 status post 27 months of treatment.  INTERVAL HISTORY: Sarah Chandler 38 y.o. female returns to the clinic today for follow-up visit. Discussed the use of AI scribe software for clinical note transcription with the patient, who gave verbal consent to proceed.  History of Present Illness Sarah Chandler is a 38 year old female with stage 4 non-small cell lung cancer who presents for evaluation and repeat blood work.  Diagnosed with stage 4 non-small cell lung cancer, adenocarcinoma with positive ALK gene translocation, in March 2021. Initially treated with alectinib for 29 months, discontinued due to disease progression. Currently on lorlatinib , 100 mg orally once daily, since August 2023.  No new symptoms. No chest pain, breathing issues, nausea, vomiting, diarrhea, or side effects from lorlatinib . Describes life as 'boring and good,' indicating stability in her condition.  Currently taking Crestor , 20 mg.    MEDICAL HISTORY: Past Medical History:  Diagnosis Date   nscl ca dx'd 06/2019   Pneumonia 05/08/2019    ALLERGIES:  is allergic to iron.  MEDICATIONS:  Current Outpatient Medications  Medication Sig Dispense Refill   acetaminophen  (TYLENOL ) 500 MG tablet Take 1,000 mg by mouth every 6 (six) hours as  needed for moderate pain or headache.     albuterol  (VENTOLIN  HFA) 108 (90 Base) MCG/ACT inhaler Inhale 2 puffs into the lungs every 6 (six) hours as needed for wheezing or shortness of breath. 8 g 1   buPROPion  (WELLBUTRIN  XL) 150 MG 24 hr tablet TAKE 1 TABLET BY MOUTH EVERY DAY 90 tablet 1   busPIRone  (BUSPAR ) 5 MG tablet TAKE 1 TABLET BY MOUTH TWICE A DAY 180 tablet 1   hydrOXYzine  (ATARAX ) 25 MG tablet Take 1 tablet (25 mg total) by mouth 3 (three) times daily as needed for anxiety. **NEEDS APT FOR FURTHER REFILL** 30 tablet 0   levothyroxine  (SYNTHROID ) 125 MCG tablet Take 1 tablet (125 mcg total) by mouth daily. 90 tablet 3   lorlatinib  (LORBRENA ) 100 MG tablet Take 1 tablet (100 mg total) by mouth daily. Swallow tablets whole. Do not chew, crush or split tablets. 30 tablet 2   rosuvastatin  (CRESTOR ) 20 MG tablet TAKE 1 TABLET BY MOUTH EVERY DAY 90 tablet 2   No current facility-administered medications for this visit.    SURGICAL HISTORY:  Past Surgical History:  Procedure Laterality Date   BIOPSY  07/03/2019   Procedure: BIOPSY;  Surgeon: Jude Harden GAILS, MD;  Location: [facility] ENDOSCOPY;  Service: Cardiopulmonary;;   BRONCHIAL BRUSHINGS  07/03/2019   Procedure: BRONCHIAL BRUSHINGS;  Surgeon: Jude Harden GAILS, MD;  Location: [facility] ENDOSCOPY;  Service: Cardiopulmonary;;   BRONCHIAL WASHINGS  07/03/2019   Procedure: BRONCHIAL WASHINGS;  Surgeon: Jude Harden GAILS, MD;  Location: [facility] ENDOSCOPY;  Service: Cardiopulmonary;;   TOOTH EXTRACTION     VIDEO BRONCHOSCOPY N/A 07/03/2019  Procedure: VIDEO BRONCHOSCOPY WITH FLUORO;  Surgeon: Jude Harden GAILS, MD;  Location: [facility] ENDOSCOPY;  Service: Cardiopulmonary;  Laterality: N/A;  LMA    REVIEW OF SYSTEMS:  A comprehensive review of systems was negative except for: Constitutional: positive for fatigue   PHYSICAL EXAMINATION: General appearance: alert, cooperative, fatigued, and no distress Head: Normocephalic, without obvious abnormality, atraumatic Neck:  no adenopathy, no JVD, supple, symmetrical, trachea midline, and thyroid  not enlarged, symmetric, no tenderness/mass/nodules Lymph nodes: Cervical, supraclavicular, and axillary nodes normal. Resp: clear to auscultation bilaterally Back: symmetric, no curvature. ROM normal. No CVA tenderness. Cardio: regular rate and rhythm, S1, S2 normal, no murmur, click, rub or gallop GI: soft, non-tender; bowel sounds normal; no masses,  no organomegaly Extremities: extremities normal, atraumatic, no cyanosis or edema  ECOG PERFORMANCE STATUS: 1 - Symptomatic but completely ambulatory  Blood pressure (!) 153/93, pulse 89, temperature 97.6 F (36.4 C), temperature source Temporal, resp. rate 17, height 5' 10 (1.778 m), weight (!) 317 lb (143.8 kg), SpO2 97%.  LABORATORY DATA: Lab Results  Component Value Date   WBC 7.2 03/17/2024   HGB 12.8 03/17/2024   HCT 38.3 03/17/2024   MCV 85.7 03/17/2024   PLT 218 03/17/2024      Chemistry      Component Value Date/Time   NA 140 03/17/2024 1035   NA 143 08/28/2021 0811   K 3.9 03/17/2024 1035   CL 105 03/17/2024 1035   CO2 24 03/17/2024 1035   BUN 6 03/17/2024 1035   BUN 8 08/28/2021 0811   CREATININE 0.67 03/17/2024 1035      Component Value Date/Time   CALCIUM  9.3 03/17/2024 1035   ALKPHOS 64 03/17/2024 1035   AST 22 03/17/2024 1035   ALT 28 03/17/2024 1035   BILITOT 0.3 03/17/2024 1035       RADIOGRAPHIC STUDIES: No results found.    ASSESSMENT AND PLAN: This is a very pleasant 38 years old white female recently diagnosed with stage IV non-small cell lung cancer, adenocarcinoma with positive ALK gene translocation in March 2021.  The patient started treatment with Alecensa  on July 23, 2019 and within few days she started not significant improvement in her condition with less shortness of breath as well as decrease in supraclavicular lymphadenopathy.  She is currently on treatment with Alecensa  600 mg p.o. twice daily status post 29  months of treatment. The patient was lost to follow-up for the last 6 months but she continues to do fine and she is taking her medication with Alecensa  (Alectinib) as prescribed.  Her treatment was discontinued secondary to disease progression. The patient has started treatment with Lorlatinib  100 mg p.o. daily status post 27 months of treatment.  She has been tolerating this treatment well except for fatigue. She has repeat CBC and comprehensive metabolic panel today that are unremarkable. Assessment and Plan Assessment & Plan Stage IV ALK-positive non-small cell lung adenocarcinoma Stage IV non-small cell lung adenocarcinoma with positive ALK gene translocation, diagnosed in March 2021. Initially treated with alectinib for 27 months, discontinued due to disease progression. Currently on lorlatinib  100 mg PO daily since August 2023, with 28 months of treatment completed. No reported side effects from lorlatinib . Blood work is stable, awaiting lipid panel results to monitor for hypercholesterolemia. - Continue lorlatinib  100 mg PO daily. - Scheduled follow-up in 3 months with a scan.  Hypercholesterolemia Potential side effect of lorlatinib . Currently managed with Crestor  20 mg. Awaiting lipid panel results to assess cholesterol levels. - Continue Crestor  20  mg daily. - Will review lipid panel results when available. The patient was advised to call immediately if she has any other concerning symptoms in the interval.  The patient voices understanding of current disease status and treatment options and is in agreement with the current care plan.  All questions were answered. The patient knows to call the clinic with any problems, questions or concerns. We can certainly see the patient much sooner if necessary. The total time spent in the appointment was 20 minutes.  Disclaimer: This note was dictated with voice recognition software. Similar sounding words can inadvertently be transcribed and may  not be corrected upon review.

## 2024-03-19 ENCOUNTER — Other Ambulatory Visit: Payer: Self-pay

## 2024-04-06 ENCOUNTER — Ambulatory Visit

## 2024-04-06 VITALS — BP 127/85 | HR 85 | Temp 98.1°F | Ht 70.0 in | Wt 324.0 lb

## 2024-04-06 DIAGNOSIS — Z0000 Encounter for general adult medical examination without abnormal findings: Secondary | ICD-10-CM | POA: Insufficient documentation

## 2024-04-06 DIAGNOSIS — F411 Generalized anxiety disorder: Secondary | ICD-10-CM

## 2024-04-06 DIAGNOSIS — E66813 Obesity, class 3: Secondary | ICD-10-CM | POA: Diagnosis not present

## 2024-04-06 DIAGNOSIS — F321 Major depressive disorder, single episode, moderate: Secondary | ICD-10-CM

## 2024-04-06 DIAGNOSIS — E039 Hypothyroidism, unspecified: Secondary | ICD-10-CM | POA: Diagnosis not present

## 2024-04-06 DIAGNOSIS — C3491 Malignant neoplasm of unspecified part of right bronchus or lung: Secondary | ICD-10-CM

## 2024-04-06 DIAGNOSIS — Z6841 Body Mass Index (BMI) 40.0 and over, adult: Secondary | ICD-10-CM

## 2024-04-06 LAB — POCT GLYCOSYLATED HEMOGLOBIN (HGB A1C): Hemoglobin A1C: 5.6 % (ref 4.0–5.6)

## 2024-04-06 MED ORDER — HYDROXYZINE HCL 25 MG PO TABS: 25.0000 mg | ORAL_TABLET | Freq: Three times a day (TID) | ORAL | 0 refills | Status: AC | PRN

## 2024-04-06 MED ORDER — BUSPIRONE HCL 10 MG PO TABS: 10.0000 mg | ORAL_TABLET | Freq: Three times a day (TID) | ORAL | 2 refills | Status: AC

## 2024-04-06 NOTE — Patient Instructions (Signed)
 VISIT SUMMARY: Today, you had your annual physical exam and we discussed your anxiety, mood symptoms, ADHD concerns, lipid levels, and general health maintenance. We made some adjustments to your medications and provided referrals for further evaluations and therapy.  YOUR PLAN: GENERALIZED ANXIETY DISORDER: Your anxiety symptoms are more bothersome than your depression. Buspar  has not been effective for you. -Increase Buspar  to 10 mg twice daily, with the option to increase to three times daily. -Discontinue 5 mg Buspar  tablets after finishing the remaining supply. -Referred to therapy at the med center in [place]. -Referred to psychiatry for medication management and ADHD evaluation.  ATTENTION DEFICIT HYPERACTIVITY DISORDER (ADHD): You have symptoms that may be related to ADHD and are interested in an evaluation. -Referred to psychiatry for ADHD evaluation.  CHRONIC DEPRESSION: Your depression symptoms are secondary to your anxiety. -Continue taking Wellbutrin  150 mg once daily.  GENERAL HEALTH MAINTENANCE: Your routine health maintenance is up to date, but you are eligible for the HPV vaccination and have not had a Pap smear due to discomfort. -Performed finger stick A1c test. -Ensured flu shot, tetanus, and COVID vaccinations are up to date. -Offered HPV vaccination. -Offered Pap smear if you decide to proceed.  If you have any problems before your next visit feel free to message me via MyChart (minor issues or questions) or call the office, otherwise you may reach out to schedule an office visit.  Thank you! Saddie Sacks, PA-C

## 2024-04-06 NOTE — Assessment & Plan Note (Signed)
 Discussed natural and sustainable ways to lose weight. TDEE is 2700 calories per day. Moderate calorie deficit would be 2000 calories per day. 1800 calories for aggressive deficit. Discussed not to go any lower than this for daily calorie intake. Provided with low impact HIIT workouts that she can try at home for calorie burning.

## 2024-04-06 NOTE — Progress Notes (Signed)
 "  Complete physical exam  Patient: Sarah Chandler   DOB: [birth year]   38 y.o. Female  MRN: [id number]  Subjective:    Chief Complaint  Patient presents with   Annual Exam    History of Present Illness Sarah Chandler is a 38 year old female who presents for an annual physical exam and medication management.  Anxiety and mood symptoms - Manages anxiety with Wellbutrin  150 mg once daily and hydroxyzine  as needed. - Buspar  has not improved symptoms; open to increasing dose for anxiety. - Anxiety is more bothersome than depression. - Occasional low moods present but does not consider herself depressed. - Experiences irritability and snapping at her grandmother, attributed to anxiety and ADHD symptoms. - Interested in therapy, prefers in-person sessions due to concerns about distractions at home. - Expresses desire to be evaluated by psychiatry for underlying conditions such as ADHD or ASD.   Attention deficit and neurodevelopmental concerns - Experiences forgetfulness and attributes symptoms to ADHD. - Interested in evaluation for ADHD and possible autism, as these may contribute to anxiety and forgetfulness.  Lipid abnormalities - Triglycerides improved from 220 to 162 since last check. - LDL cholesterol remains stable. - Attributes some cholesterol changes to chemotherapy medication, which can elevate cholesterol levels. - Tolerating rosuvastatin  well   Oncologic surveillance and chemotherapy due to lung cancer  - Undergoing regular monitoring at cancer center, including monthly kidney and liver function tests, which have been normal. - Scheduled for routine CT scan in March. - Currently taking Lorbrena , a chemotherapy tablet.  Thyroid  dysfunction - Managing thyroid  condition with 125 mcg of medication. - Upcoming appointment with endocrinologist.  Diabetes screening - Has not had an A1c test in over two years. - Prefers finger stick test to avoid blood draw.  Preventive  health maintenance - Up to date on flu, tetanus, and COVID vaccinations. - Has not had a Pap smear due to discomfort with the procedure. Declines today.       Most recent fall risk assessment:    04/06/2024    2:02 PM  Fall Risk   Falls in the past year? 0  Injury with Fall? 0  Risk for fall due to : No Fall Risks  Follow up Falls evaluation completed     Most recent depression screenings:    04/06/2024    2:03 PM 01/01/2024   10:14 AM  PHQ 2/9 Scores  PHQ - 2 Score 0 2  PHQ- 9 Score 6 14      Data saved with a previous flowsheet row definition        Patient Care Team: Sarah Chandler as PCP - General (Physician Assistant) Sarah Lonell BROCKS, RN as Oncology Nurse Navigator   Show/hide medication list[1]  ROS   Per HPI      Objective:     BP 127/85   Pulse 85   Temp 98.1 F (36.7 C) (Oral)   Ht 5' 10 (1.778 m)   Wt (!) 324 lb (147 kg)   LMP 03/23/2024   SpO2 97%   BMI 46.49 kg/m    Physical Exam Constitutional:      General: She is not in acute distress.    Appearance: Normal appearance.  Eyes:     Pupils: Pupils are equal, round, and reactive to light.  Cardiovascular:     Rate and Rhythm: Normal rate and regular rhythm.     Heart sounds: Normal heart sounds. No murmur heard.    No  friction rub. No gallop.  Pulmonary:     Effort: Pulmonary effort is normal. No respiratory distress.     Breath sounds: Normal breath sounds.  Abdominal:     General: Bowel sounds are normal.  Musculoskeletal:        General: No swelling.     Cervical back: Neck supple.  Lymphadenopathy:     Cervical: No cervical adenopathy.  Skin:    General: Skin is warm and dry.  Neurological:     General: No focal deficit present.     Mental Status: She is alert.  Psychiatric:        Mood and Affect: Mood normal.        Behavior: Behavior normal.        Thought Content: Thought content normal.      Results for orders placed or performed in visit on 04/06/24   POCT HgB A1C  Result Value Ref Range   Hemoglobin A1C 5.6 4.0 - 5.6 %   HbA1c POC (<> result, manual entry)     HbA1c, POC (prediabetic range)     HbA1c, POC (controlled diabetic range)     Last CBC Lab Results  Component Value Date   WBC 7.2 03/17/2024   HGB 12.8 03/17/2024   HCT 38.3 03/17/2024   MCV 85.7 03/17/2024   MCH 28.6 03/17/2024   RDW 13.4 03/17/2024   PLT 218 03/17/2024   Last metabolic panel Lab Results  Component Value Date   GLUCOSE 98 03/17/2024   NA 140 03/17/2024   K 3.9 03/17/2024   CL 105 03/17/2024   CO2 24 03/17/2024   BUN 6 03/17/2024   CREATININE 0.67 03/17/2024   GFRNONAA >60 03/17/2024   CALCIUM  9.3 03/17/2024   PHOS 4.2 07/14/2019   PROT 7.5 03/17/2024   ALBUMIN 4.3 03/17/2024   LABGLOB 2.8 08/28/2021   AGRATIO 1.5 08/28/2021   BILITOT 0.3 03/17/2024   ALKPHOS 64 03/17/2024   AST 22 03/17/2024   ALT 28 03/17/2024   ANIONGAP 11 03/17/2024   Last lipids Lab Results  Component Value Date   CHOL 229 (H) 03/17/2024   HDL 75 03/17/2024   LDLCALC 122 (H) 03/17/2024   TRIG 162 (H) 03/17/2024   CHOLHDL 3.1 03/17/2024   Last hemoglobin A1c Lab Results  Component Value Date   HGBA1C 5.6 04/06/2024   Last thyroid  functions Lab Results  Component Value Date   TSH 4.50 09/30/2023   T3TOTAL 149 05/08/2019   FREET4 0.9 07/31/2023        Assessment & Plan:    Routine Health Maintenance and Physical Exam  Health Maintenance  Topic Date Due   Hepatitis B Vaccine (1 of 3 - 19+ 3-dose series) Never done   HPV Vaccine (1 - Risk 3-dose SCDM series) Never done   Pap with HPV screening  04/06/2025*   COVID-19 Vaccine (5 - Moderna risk 2025-26 season) 07/08/2024   DTaP/Tdap/Td vaccine (8 - Td or Tdap) 01/30/2033   Flu Shot  Completed   Hepatitis C Screening  Completed   HIV Screening  Completed   Pneumococcal Vaccine  Aged Out   Meningitis B Vaccine  Aged Out  *Topic was postponed. The date shown is not the original due date.     Discussed health benefits of physical activity, and encouraged her to engage in regular exercise appropriate for her age and condition.  Wellness examination Assessment & Plan: Routine health maintenance up to date. Eligible for HPV vaccination, declined.  Declined Pap smear  due to discomfort. - Performed finger stick A1c test to update.  - Ensured flu shot, tetanus, and COVID vaccinations are up to date. - Offered Pap smear if she decides to proceed.   GAD (generalized anxiety disorder) Assessment & Plan: Anxiety symptoms more bothersome than depression. Buspar  perceived as ineffective. Interested in therapy and psychiatric evaluation for ADHD and autism spectrum disorder. - Increased Buspar  to 10 mg twice daily, with option to increase to three times daily. - Discontinued 5 mg Buspar  tablets; instructed to finish remaining supply. - Referred to therapy at the med center in [place]. - Referred to psychiatry for medication management and ADHD evaluation ([facility])   Orders: -     hydrOXYzine  HCl; Take 1 tablet (25 mg total) by mouth 3 (three) times daily as needed for anxiety. **NEEDS APT FOR FURTHER REFILL**  Dispense: 30 tablet; Refill: 0 -     Ambulatory referral to Psychiatry -     Ambulatory referral to Psychology  BMI 45.0-49.9, adult (HCC) -     POCT glycosylated hemoglobin (Hb A1C)  Depression, major, single episode, moderate (HCC) Assessment & Plan: Dc'd Pristiq  due to inefficacy. Doing well on Wellbutrin  150 mg XL daily.    Acquired hypothyroidism Assessment & Plan: Follows with endocrinology. Has appt in the next few weeks. Continue levothyroxine  125 mcg daily   Primary adenocarcinoma of right lung [facility]) Assessment & Plan: Patient followed per Dr. Gatha at [facility].    Class 3 severe obesity due to excess calories with serious comorbidity and body mass index (BMI) of 40.0 to 44.9 in adult [facility]) Assessment &  Plan: Discussed natural and sustainable ways to lose weight. TDEE is 2700 calories per day. Moderate calorie deficit would be 2000 calories per day. 1800 calories for aggressive deficit. Discussed not to go any lower than this for daily calorie intake. Provided with low impact HIIT workouts that she can try at home for calorie burning.    Other orders -     busPIRone  HCl; Take 1 tablet (10 mg total) by mouth 3 (three) times daily.  Dispense: 60 tablet; Refill: 2     Return in about 6 months (around 10/05/2024) for Med check.     Saddie JULIANNA Sacks, PA-C     [1]  Outpatient Medications Prior to Visit  Medication Sig   acetaminophen  (TYLENOL ) 500 MG tablet Take 1,000 mg by mouth every 6 (six) hours as needed for moderate pain or headache.   albuterol  (VENTOLIN  HFA) 108 (90 Base) MCG/ACT inhaler Inhale 2 puffs into the lungs every 6 (six) hours as needed for wheezing or shortness of breath.   buPROPion  (WELLBUTRIN  XL) 150 MG 24 hr tablet TAKE 1 TABLET BY MOUTH EVERY DAY   levothyroxine  (SYNTHROID ) 125 MCG tablet Take 1 tablet (125 mcg total) by mouth daily.   lorlatinib  (LORBRENA ) 100 MG tablet Take 1 tablet (100 mg total) by mouth daily. Swallow tablets whole. Do not chew, crush or split tablets.   rosuvastatin  (CRESTOR ) 20 MG tablet TAKE 1 TABLET BY MOUTH EVERY DAY   [DISCONTINUED] busPIRone  (BUSPAR ) 5 MG tablet TAKE 1 TABLET BY MOUTH TWICE A DAY   [DISCONTINUED] hydrOXYzine  (ATARAX ) 25 MG tablet Take 1 tablet (25 mg total) by mouth 3 (three) times daily as needed for anxiety. **NEEDS APT FOR FURTHER REFILL**   No facility-administered medications prior to visit.   "

## 2024-04-06 NOTE — Assessment & Plan Note (Signed)
 Follows with endocrinology. Has appt in the next few weeks. Continue levothyroxine  125 mcg daily

## 2024-04-06 NOTE — Assessment & Plan Note (Signed)
 Anxiety symptoms more bothersome than depression. Buspar  perceived as ineffective. Interested in therapy and psychiatric evaluation for ADHD and autism spectrum disorder. - Increased Buspar  to 10 mg twice daily, with option to increase to three times daily. - Discontinued 5 mg Buspar  tablets; instructed to finish remaining supply. - Referred to therapy at the med center in [place]. - Referred to psychiatry for medication management and ADHD evaluation ([facility])

## 2024-04-06 NOTE — Assessment & Plan Note (Signed)
 Dc'd Pristiq  due to inefficacy. Doing well on Wellbutrin  150 mg XL daily.

## 2024-04-06 NOTE — Assessment & Plan Note (Signed)
Patient followed per Dr. Shirline Frees at [facility].

## 2024-04-06 NOTE — Assessment & Plan Note (Signed)
 Routine health maintenance up to date. Eligible for HPV vaccination, declined.  Declined Pap smear due to discomfort. - Performed finger stick A1c test to update.  - Ensured flu shot, tetanus, and COVID vaccinations are up to date. - Offered Pap smear if she decides to proceed.

## 2024-04-07 ENCOUNTER — Other Ambulatory Visit: Payer: Self-pay

## 2024-04-08 ENCOUNTER — Other Ambulatory Visit: Payer: Self-pay | Admitting: Physician Assistant

## 2024-04-08 ENCOUNTER — Other Ambulatory Visit: Payer: Self-pay

## 2024-04-08 DIAGNOSIS — C3491 Malignant neoplasm of unspecified part of right bronchus or lung: Secondary | ICD-10-CM

## 2024-04-08 MED ORDER — LORLATINIB 100 MG PO TABS
100.0000 mg | ORAL_TABLET | Freq: Every day | ORAL | 2 refills | Status: AC
  Filled 2024-04-08 – 2024-04-10 (×2): qty 30, 30d supply, fill #0
  Filled 2024-05-08 – 2024-05-11 (×2): qty 30, 30d supply, fill #1

## 2024-04-10 ENCOUNTER — Other Ambulatory Visit: Payer: Self-pay

## 2024-04-10 NOTE — Progress Notes (Signed)
 Specialty Pharmacy Refill Coordination Note  Sarah Chandler is a 39 y.o. female contacted today regarding refills of specialty medication(s) Lorlatinib  (LORBRENA )   Patient requested Delivery   Delivery date: 04/16/24   Verified address: [address] [place] [facility] [address]   Medication will be filled on: 04/15/24

## 2024-04-15 ENCOUNTER — Other Ambulatory Visit: Payer: Self-pay

## 2024-04-19 ENCOUNTER — Encounter: Payer: Self-pay | Admitting: Internal Medicine

## 2024-04-20 ENCOUNTER — Other Ambulatory Visit: Payer: Self-pay | Admitting: Pharmacist

## 2024-05-08 ENCOUNTER — Other Ambulatory Visit: Payer: Self-pay

## 2024-05-11 ENCOUNTER — Other Ambulatory Visit: Payer: Self-pay

## 2024-05-11 ENCOUNTER — Other Ambulatory Visit (HOSPITAL_COMMUNITY): Payer: Self-pay

## 2024-05-11 ENCOUNTER — Encounter (INDEPENDENT_AMBULATORY_CARE_PROVIDER_SITE_OTHER): Payer: Self-pay

## 2024-05-11 NOTE — Progress Notes (Signed)
 Specialty Pharmacy Refill Coordination Note  Sarah Chandler is a 39 y.o. female contacted today regarding refills of specialty medication(s) Lorlatinib  (LORBRENA )   Patient requested Delivery   Delivery date: 05/14/24   Verified address: [address] [place], [place] [address]   Medication will be filled on: 05/13/24

## 2024-05-13 ENCOUNTER — Other Ambulatory Visit: Payer: Self-pay

## 2024-05-19 ENCOUNTER — Ambulatory Visit: Admitting: Clinical

## 2024-06-08 ENCOUNTER — Inpatient Hospital Stay

## 2024-06-08 ENCOUNTER — Ambulatory Visit (HOSPITAL_COMMUNITY)

## 2024-06-18 ENCOUNTER — Inpatient Hospital Stay: Admitting: Internal Medicine

## 2024-07-30 ENCOUNTER — Ambulatory Visit: Admitting: Internal Medicine

## 2024-10-07 ENCOUNTER — Ambulatory Visit
